# Patient Record
Sex: Male | Born: 1960 | Race: Black or African American | Hispanic: No | Marital: Married | Smoking: Never smoker
Health system: Southern US, Community
[De-identification: ages and names within clinical notes are randomized; demographics above are authoritative.]

## PROBLEM LIST (undated history)

## (undated) DIAGNOSIS — G4730 Sleep apnea, unspecified: Secondary | ICD-10-CM

## (undated) DIAGNOSIS — N5089 Other specified disorders of the male genital organs: Secondary | ICD-10-CM

## (undated) DIAGNOSIS — E119 Type 2 diabetes mellitus without complications: Secondary | ICD-10-CM

## (undated) DIAGNOSIS — N451 Epididymitis: Secondary | ICD-10-CM

## (undated) DIAGNOSIS — Z8739 Personal history of other diseases of the musculoskeletal system and connective tissue: Secondary | ICD-10-CM

## (undated) HISTORY — PX: CIRCUMCISION: SUR203

## (undated) HISTORY — PX: MASS BIOPSY: SHX5445

---

## 2015-04-08 ENCOUNTER — Ambulatory Visit: Payer: Self-pay | Admitting: Family Medicine

## 2015-04-15 ENCOUNTER — Ambulatory Visit (INDEPENDENT_AMBULATORY_CARE_PROVIDER_SITE_OTHER): Payer: Self-pay | Admitting: Family Medicine

## 2015-04-15 ENCOUNTER — Encounter: Payer: Self-pay | Admitting: Family Medicine

## 2015-04-15 VITALS — BP 137/82 | HR 68 | Temp 98.2°F | Resp 18 | Ht 71.5 in | Wt 278.0 lb

## 2015-04-15 DIAGNOSIS — E119 Type 2 diabetes mellitus without complications: Secondary | ICD-10-CM

## 2015-04-15 DIAGNOSIS — R030 Elevated blood-pressure reading, without diagnosis of hypertension: Secondary | ICD-10-CM | POA: Insufficient documentation

## 2015-04-15 DIAGNOSIS — Z23 Encounter for immunization: Secondary | ICD-10-CM

## 2015-04-15 LAB — POCT URINALYSIS DIP (DEVICE)
Bilirubin Urine: NEGATIVE
Glucose, UA: NEGATIVE mg/dL
Hgb urine dipstick: NEGATIVE
KETONES UR: NEGATIVE mg/dL
Nitrite: NEGATIVE
PH: 5.5 (ref 5.0–8.0)
PROTEIN: 100 mg/dL — AB
Urobilinogen, UA: 1 mg/dL (ref 0.0–1.0)

## 2015-04-15 LAB — HEMOGLOBIN A1C
Hgb A1c MFr Bld: 9.6 % — ABNORMAL HIGH (ref <5.7)
Mean Plasma Glucose: 229 mg/dL — ABNORMAL HIGH (ref <117)

## 2015-04-15 MED ORDER — LISINOPRIL 5 MG PO TABS: 5.0000 mg | ORAL_TABLET | Freq: Every day | ORAL | Status: DC

## 2015-04-15 NOTE — Progress Notes (Signed)
Subjective:    Patient ID: Spencer Robles, male    DOB: 1960/11/30, 54 y.o.   MRN: [id number]  HPI  Mr. Dallyn Bergland, a 54 year old male presents to establish care. He states that he was under the care of Dr. Vevelyn Pat, at [facility] in [place], [place] prior to relocating to the area. Patient reports a history of type 2 diabetes. He maintains that he was on Metformin twice daily, but discontinued medication due to financial constraints. He has been without medications for the past several months. He currently does not check blood sugars at home.  Patient denies foot ulcerations, increase appetite, nausea, paresthesia of the feet, polydipsia, polyuria, visual disturbances, vomitting and weight loss. Evaluation to date has been included: hemoglobin A1C.   No past medical history on file.  Social History   Social History  . Marital Status: Legally Separated    Spouse Name: N/A  . Number of Children: N/A  . Years of Education: N/A   Occupational History  . Not on file.   Social History Main Topics  . Smoking status: Never Smoker   . Smokeless tobacco: Not on file  . Alcohol Use: No  . Drug Use: No  . Sexual Activity: Not on file   Other Topics Concern  . Not on file   Social History Narrative   Immunization History  Administered Date(s) Administered  . Influenza,inj,Quad PF,36+ Mos 04/15/2015     Review of Systems  Constitutional: Negative.   HENT: Negative.   Eyes: Negative.   Respiratory: Negative.  Negative for shortness of breath.   Cardiovascular: Negative for chest pain, palpitations and leg swelling.  Gastrointestinal: Negative.   Endocrine: Negative.  Negative for polydipsia, polyphagia and polyuria.  Genitourinary: Negative.  Negative for dysuria, frequency, hematuria and decreased urine volume.  Musculoskeletal: Negative.   Skin: Negative.   Allergic/Immunologic: Negative.   Neurological: Negative.  Negative for dizziness and headaches.   Hematological: Negative.   Psychiatric/Behavioral: Negative.        Objective:   Physical Exam  Constitutional: He is oriented to person, place, and time. He appears well-developed and well-nourished.  HENT:  Head: Normocephalic and atraumatic.  Right Ear: External ear normal.  Left Ear: External ear normal.  Nose: Nose normal.  Mouth/Throat: Oropharynx is clear and moist.  Eyes: Conjunctivae and EOM are normal. Pupils are equal, round, and reactive to light.  Neck: Normal range of motion. Neck supple.  Cardiovascular: Normal rate, regular rhythm, normal heart sounds and intact distal pulses.   Pulmonary/Chest: Effort normal and breath sounds normal.  Abdominal: Soft. Bowel sounds are normal.  Musculoskeletal: Normal range of motion.  Neurological: He is alert and oriented to person, place, and time. He has normal reflexes.  Skin: Skin is warm and dry.  Psychiatric: He has a normal mood and affect. His behavior is normal. Judgment and thought content normal.      BP 137/82 mmHg  Pulse 68  Temp(Src) 98.2 F (36.8 C) (Oral)  Resp 18  Ht 5' 11.5" (1.816 m)  Wt 278 lb (126.1 kg)  BMI 38.24 kg/m2  SpO2 98% Assessment & Plan:  1. Diabetes mellitus type 2, uncomplicated Will re-start metformin. Recommend a lowfat, low carbohydrate diet divided over 5-6 small meals, increase water intake to 6-8 glasses, and 150 minutes per week of cardiovascular exercise. Will follow up in 1 month after re-starting medication.  - metFORMIN (GLUCOPHAGE) 500 MG tablet; Take 500 mg by mouth 2 (two) times daily  with a meal. - POCT urinalysis dipstick - Hemoglobin A1c - COMPLETE METABOLIC PANEL WITH GFR - Lipid Panel - lisinopril (PRINIVIL,ZESTRIL) 5 MG tablet; Take 1 tablet (5 mg total) by mouth daily.  Dispense: 30 tablet; Refill: 2  2. Prehypertension Blood pressure in prehypertensive state. Started low dose lisinopril. The patient is asked to make an attempt to improve diet and exercise patterns  to aid in medical management of this problem.  3. Need for immunization against influenza  - Flu Vaccine QUAD 36+ mos IM (Fluarix)   The patient was given clear instructions to go to ER or return to medical center if symptoms do not improve, worsen or new problems develop. The patient verbalized understanding. Will notify patient with laboratory results.   Dorena Dew, FNP

## 2015-04-15 NOTE — Patient Instructions (Addendum)
Return to clinic in 3 months for follow up  Start Lisinopril 5 mg daily Recommend a lowfat, low carbohydrate diet divided over 5-6 small meals, increase water intake to 6-8 glasses, and 150 minutes per week of cardiovascular exercise   Recommend follow up with prostate examination Diabetes and Exercise Exercising regularly is important. It is not just about losing weight. It has many health benefits, such as:  Improving your overall fitness, flexibility, and endurance.  Increasing your bone density.  Helping with weight control.  Decreasing your body fat.  Increasing your muscle strength.  Reducing stress and tension.  Improving your overall health. People with diabetes who exercise gain additional benefits because exercise:  Reduces appetite.  Improves the body's use of blood sugar (glucose).  Helps lower or control blood glucose.  Decreases blood pressure.  Helps control blood lipids (such as cholesterol and triglycerides).  Improves the body's use of the hormone insulin by:  Increasing the body's insulin sensitivity.  Reducing the body's insulin needs.  Decreases the risk for heart disease because exercising:  Lowers cholesterol and triglycerides levels.  Increases the levels of good cholesterol (such as high-density lipoproteins [HDL]) in the body.  Lowers blood glucose levels. YOUR ACTIVITY PLAN  Choose an activity that you enjoy and set realistic goals. Your health care provider or diabetes educator can help you make an activity plan that works for you. Exercise regularly as directed by your health care provider. This includes:  Performing resistance training twice a week such as push-ups, sit-ups, lifting weights, or using resistance bands.  Performing 150 minutes of cardio exercises each week such as walking, running, or playing sports.  Staying active and spending no more than 90 minutes at one time being inactive. Even short bursts of exercise are good  for you. Three 10-minute sessions spread throughout the day are just as beneficial as a single 30-minute session. Some exercise ideas include:  Taking the dog for a walk.  Taking the stairs instead of the elevator.  Dancing to your favorite song.  Doing an exercise video.  Doing your favorite exercise with a friend. RECOMMENDATIONS FOR EXERCISING WITH TYPE 1 OR TYPE 2 DIABETES   Check your blood glucose before exercising. If blood glucose levels are greater than 240 mg/dL, check for urine ketones. Do not exercise if ketones are present.  Avoid injecting insulin into areas of the body that are going to be exercised. For example, avoid injecting insulin into:  The arms when playing tennis.  The legs when jogging.  Keep a record of:  Food intake before and after you exercise.  Expected peak times of insulin action.  Blood glucose levels before and after you exercise.  The type and amount of exercise you have done.  Review your records with your health care provider. Your health care provider will help you to develop guidelines for adjusting food intake and insulin amounts before and after exercising.  If you take insulin or oral hypoglycemic agents, watch for signs and symptoms of hypoglycemia. They include:  Dizziness.  Shaking.  Sweating.  Chills.  Confusion.  Drink plenty of water while you exercise to prevent dehydration or heat stroke. Body water is lost during exercise and must be replaced.  Talk to your health care provider before starting an exercise program to make sure it is safe for you. Remember, almost any type of activity is better than none. Document Released: 09/30/2003 Document Revised: 11/24/2013 Document Reviewed: 12/17/2012 [facility] Patient Information 2015 [place], [place]. This information  is not intended to replace advice given to you by your health care provider. Make sure you discuss any questions you have with your health care provider. Daily  Diabetes Record Check your blood glucose (BG) as directed by your health care provider. Use this form to record your results as well as any diabetes medicines you take, including insulin. Checking your BG, recording it, and bringing your records to your health care provider is very helpful in managing your diabetes. These numbers help your health care provider know if any changes are needed to your diabetes plan.  Week of _____________________________ Date: _________  Spencer Robles, BG/Medicines: ________________ / __________________________________________________________  LUNCH, BG/Medicines: ____________________ / __________________________________________________________  Spencer Robles, BG/Medicines: ___________________ / __________________________________________________________  BEDTIME, BG/Medicines: __________________ / __________________________________________________________ Date: _________  Spencer Robles, BG/Medicines: ________________ / __________________________________________________________  LUNCH, BG/Medicines: ____________________ / __________________________________________________________  Spencer Robles, BG/Medicines: ___________________ / __________________________________________________________  BEDTIME, BG/Medicines: __________________ / __________________________________________________________ Date: _________  Spencer Robles, BG/Medicines: ________________ / __________________________________________________________  LUNCH, BG/Medicines: ____________________ / __________________________________________________________  Spencer Robles, BG/Medicines: ___________________ / __________________________________________________________  BEDTIME, BG/Medicines: __________________ / __________________________________________________________ Date: _________  Spencer Robles, BG/Medicines: ________________ / __________________________________________________________  LUNCH, BG/Medicines: ____________________  / __________________________________________________________  Spencer Robles, BG/Medicines: ___________________ / __________________________________________________________  BEDTIME, BG/Medicines: __________________ / __________________________________________________________ Date: _________  Spencer Robles, BG/Medicines: ________________ / __________________________________________________________  LUNCH, BG/Medicines: ____________________ / __________________________________________________________  Spencer Robles, BG/Medicines: ___________________ / __________________________________________________________  BEDTIME, BG/Medicines: __________________ / __________________________________________________________ Date: _________  Spencer Robles, BG/Medicines: ________________ / __________________________________________________________  LUNCH, BG/Medicines: ____________________ / __________________________________________________________  Spencer Robles, BG/Medicines: ___________________ / __________________________________________________________  BEDTIME, BG/Medicines: __________________ / __________________________________________________________ Date: _________  Spencer Robles, BG/Medicines: ________________ / __________________________________________________________  LUNCH, BG/Medicines: ____________________ / __________________________________________________________  Spencer Robles, BG/Medicines: ___________________ / __________________________________________________________  BEDTIME, BG/Medicines: __________________ / __________________________________________________________ Notes: __________________________________________________________________________________________________ Document Released: 06/13/2004 Document Revised: 11/24/2013 Document Reviewed: 09/03/2013 [employer] Patient Information 2015 [place] [place]. This information is not intended to replace advice given to you by your health care provider. Make sure  you discuss any questions you have with your health care provider.

## 2015-04-16 LAB — COMPLETE METABOLIC PANEL WITH GFR
ALBUMIN: 4.4 g/dL (ref 3.6–5.1)
ALK PHOS: 96 U/L (ref 40–115)
ALT: 19 U/L (ref 9–46)
AST: 22 U/L (ref 10–35)
BUN: 10 mg/dL (ref 7–25)
CALCIUM: 9.7 mg/dL (ref 8.6–10.3)
CO2: 26 mmol/L (ref 20–31)
CREATININE: 0.96 mg/dL (ref 0.70–1.33)
Chloride: 100 mmol/L (ref 98–110)
GFR, Est African American: >89 mL/min (ref >=60)
GFR, Est Non African American: 88 mL/min (ref >=60)
Glucose, Bld: 148 mg/dL — ABNORMAL HIGH (ref 65–99)
Potassium: 4.4 mmol/L (ref 3.5–5.3)
Sodium: 141 mmol/L (ref 135–146)
TOTAL PROTEIN: 7.2 g/dL (ref 6.1–8.1)
Total Bilirubin: 0.9 mg/dL (ref 0.2–1.2)

## 2015-04-16 LAB — LIPID PANEL
CHOLESTEROL: 131 mg/dL (ref 125–200)
HDL: 44 mg/dL (ref >=40)
LDL Cholesterol: 74 mg/dL (ref <130)
Total CHOL/HDL Ratio: 3 Ratio (ref <=5.0)
Triglycerides: 64 mg/dL (ref <150)
VLDL: 13 mg/dL (ref <30)

## 2015-04-18 ENCOUNTER — Telehealth: Payer: Self-pay | Admitting: Family Medicine

## 2015-04-18 ENCOUNTER — Encounter: Payer: Self-pay | Admitting: Family Medicine

## 2015-04-18 NOTE — Telephone Encounter (Signed)
Reviewed labs, hemoglobin is 9.6, goal is <7.0%.  patient reported that he had been out of anti-diabetic medication for greater than 6 months. Will re-start Metformin as discussed during office visit. Recommend a lowfat, low carbohydrate diet divided over 5-6 small meals, increase water intake to 6-8 glasses, and 150 minutes per week of cardiovascular exercise.   Will re-check hemoglobin A1C in 3 months. Patient to follow up in office as scheduled.    Dorena Dew, FNP

## 2015-04-19 NOTE — Telephone Encounter (Signed)
Tried to call patient. Number not in service. Will Try later.

## 2015-04-21 NOTE — Telephone Encounter (Signed)
Tried to call, phone not in service.

## 2015-05-17 ENCOUNTER — Encounter: Payer: Self-pay | Admitting: Family Medicine

## 2015-05-17 ENCOUNTER — Ambulatory Visit (INDEPENDENT_AMBULATORY_CARE_PROVIDER_SITE_OTHER): Payer: Self-pay | Admitting: Family Medicine

## 2015-05-17 ENCOUNTER — Other Ambulatory Visit: Payer: Self-pay

## 2015-05-17 VITALS — BP 129/78 | HR 49 | Temp 98.3°F | Resp 16 | Ht 71.5 in | Wt 254.0 lb

## 2015-05-17 DIAGNOSIS — N390 Urinary tract infection, site not specified: Secondary | ICD-10-CM

## 2015-05-17 DIAGNOSIS — E119 Type 2 diabetes mellitus without complications: Secondary | ICD-10-CM

## 2015-05-17 DIAGNOSIS — R82998 Other abnormal findings in urine: Secondary | ICD-10-CM

## 2015-05-17 DIAGNOSIS — R030 Elevated blood-pressure reading, without diagnosis of hypertension: Secondary | ICD-10-CM

## 2015-05-17 LAB — POCT URINALYSIS DIP (DEVICE)
GLUCOSE, UA: NEGATIVE mg/dL
HGB URINE DIPSTICK: NEGATIVE
Ketones, ur: NEGATIVE mg/dL
Nitrite: NEGATIVE
PROTEIN: 30 mg/dL — AB
Specific Gravity, Urine: >=1.03 (ref 1.005–1.030)
Urobilinogen, UA: 0.2 mg/dL (ref 0.0–1.0)
pH: 5.5 (ref 5.0–8.0)

## 2015-05-17 LAB — GLUCOSE, CAPILLARY: GLUCOSE-CAPILLARY: 145 mg/dL — AB (ref 65–99)

## 2015-05-17 MED ORDER — LISINOPRIL 5 MG PO TABS: 5.0000 mg | ORAL_TABLET | Freq: Every day | ORAL | Status: DC

## 2015-05-17 MED ORDER — METFORMIN HCL 500 MG PO TABS: 500.0000 mg | ORAL_TABLET | Freq: Two times a day (BID) | ORAL | Status: DC

## 2015-05-17 NOTE — Patient Instructions (Signed)
Diabetes and Exercise Exercising regularly is important. It is not just about losing weight. It has many health benefits, such as:  Improving your overall fitness, flexibility, and endurance.  Increasing your bone density.  Helping with weight control.  Decreasing your body fat.  Increasing your muscle strength.  Reducing stress and tension.  Improving your overall health. People with diabetes who exercise gain additional benefits because exercise:  Reduces appetite.  Improves the body's use of blood sugar (glucose).  Helps lower or control blood glucose.  Decreases blood pressure.  Helps control blood lipids (such as cholesterol and triglycerides).  Improves the body's use of the hormone insulin by:  Increasing the body's insulin sensitivity.  Reducing the body's insulin needs.  Decreases the risk for heart disease because exercising:  Lowers cholesterol and triglycerides levels.  Increases the levels of good cholesterol (such as high-density lipoproteins [HDL]) in the body.  Lowers blood glucose levels. YOUR ACTIVITY PLAN  Choose an activity that you enjoy, and set realistic goals. To exercise safely, you should begin practicing any new physical activity slowly, and gradually increase the intensity of the exercise over time. Your health care provider or diabetes educator can help create an activity plan that works for you. General recommendations include:  Encouraging children to engage in at least 60 minutes of physical activity each day.  Stretching and performing strength training exercises, such as yoga or weight lifting, at least 2 times per week.  Performing a total of at least 150 minutes of moderate-intensity exercise each week, such as brisk walking or water aerobics.  Exercising at least 3 days per week, making sure you allow no more than 2 consecutive days to pass without exercising.  Avoiding long periods of inactivity (90 minutes or more). When you  have to spend an extended period of time sitting down, take frequent breaks to walk or stretch. RECOMMENDATIONS FOR EXERCISING WITH TYPE 1 OR TYPE 2 DIABETES   Check your blood glucose before exercising. If blood glucose levels are greater than 240 mg/dL, check for urine ketones. Do not exercise if ketones are present.  Avoid injecting insulin into areas of the body that are going to be exercised. For example, avoid injecting insulin into:  The arms when playing tennis.  The legs when jogging.  Keep a record of:  Food intake before and after you exercise.  Expected peak times of insulin action.  Blood glucose levels before and after you exercise.  The type and amount of exercise you have done.  Review your records with your health care provider. Your health care provider will help you to develop guidelines for adjusting food intake and insulin amounts before and after exercising.  If you take insulin or oral hypoglycemic agents, watch for signs and symptoms of hypoglycemia. They include:  Dizziness.  Shaking.  Sweating.  Chills.  Confusion.  Drink plenty of water while you exercise to prevent dehydration or heat stroke. Body water is lost during exercise and must be replaced.  Talk to your health care provider before starting an exercise program to make sure it is safe for you. Remember, almost any type of activity is better than none.   This information is not intended to replace advice given to you by your health care provider. Make sure you discuss any questions you have with your health care provider.   Document Released: 09/30/2003 Document Revised: 11/24/2014 Document Reviewed: 12/17/2012 Elsevier Interactive Patient Education 2016 [employer]  

## 2015-05-17 NOTE — Progress Notes (Signed)
Subjective:    Patient ID: Spencer Robles, male    DOB: [birth year], 54 y.o.   MRN: [id number] Mr. Hershell Brandl, a 54 year old male with a history of type 2 diabetes presents for a 1 month follow up after restarting medication. He reports that he was unable to pick up lisinopril due to transportation constraints. He has been exercising, following a low carbohydrate diet, and taking medication consistently.  Diabetes He presents for his follow-up diabetic visit. He has type 2 diabetes mellitus. No MedicAlert identification noted. His disease course has been stable. Pertinent negatives for hypoglycemia include no confusion, dizziness, headaches, hunger, mood changes, nervousness/anxiousness, pallor, seizures, sleepiness, speech difficulty, sweats or tremors. Pertinent negatives for diabetes include no blurred vision, no chest pain, no fatigue, no foot paresthesias, no foot ulcerations, no polydipsia, no polyphagia, no polyuria, no visual change, no weakness and no weight loss. There are no hypoglycemic complications. Symptoms are stable. Risk factors for coronary artery disease include male sex and obesity. Current diabetic treatment includes oral agent (monotherapy). He is compliant with treatment all of the time. His weight is stable. He is following a diabetic diet. Meal planning includes carbohydrate counting. He has not had a previous visit with a dietitian. He participates in exercise daily. There is no change in his home blood glucose trend. An ACE inhibitor/angiotensin II receptor blocker is being taken. He does not see a podiatrist.Eye exam is not current.   No past medical history on file.  Social History   Social History  . Marital Status: Legally Separated    Spouse Name: N/A  . Number of Children: N/A  . Years of Education: N/A   Occupational History  . Not on file.   Social History Main Topics  . Smoking status: Never Smoker   . Smokeless tobacco: Not on file  . Alcohol Use: No   . Drug Use: No  . Sexual Activity: Not on file   Other Topics Concern  . Not on file   Social History Narrative   Immunization History  Administered Date(s) Administered  . Influenza,inj,Quad PF,36+ Mos 04/15/2015     Review of Systems  Constitutional: Negative.  Negative for weight loss and fatigue.  HENT: Negative.   Eyes: Negative.  Negative for blurred vision.  Respiratory: Negative.   Cardiovascular: Negative for chest pain and leg swelling.  Gastrointestinal: Negative.   Endocrine: Negative.  Negative for polydipsia, polyphagia and polyuria.  Genitourinary: Negative.  Negative for dysuria, frequency, hematuria and decreased urine volume.  Musculoskeletal: Negative.   Skin: Negative.  Negative for pallor.  Allergic/Immunologic: Negative.  Negative for immunocompromised state.  Neurological: Negative.  Negative for dizziness, tremors, seizures, speech difficulty, weakness and headaches.  Hematological: Negative.   Psychiatric/Behavioral: Negative.  Negative for suicidal ideas and confusion. The patient is not nervous/anxious.        Objective:   Physical Exam  Constitutional: He is oriented to person, place, and time. He appears well-developed and well-nourished.  Obesity  HENT:  Head: Normocephalic and atraumatic.  Right Ear: External ear normal.  Left Ear: External ear normal.  Nose: Nose normal.  Mouth/Throat: Oropharynx is clear and moist.  Eyes: Conjunctivae and EOM are normal. Pupils are equal, round, and reactive to light.  Neck: Normal range of motion. Neck supple.  Cardiovascular: Normal rate, regular rhythm, normal heart sounds and intact distal pulses.   Pulmonary/Chest: Effort normal and breath sounds normal.  Abdominal: Soft. Bowel sounds are normal.  Musculoskeletal: Normal range of motion.  Neurological: He is alert and oriented to person, place, and time. He has normal reflexes.  Skin: Skin is warm and dry.  Psychiatric: He has a normal mood and  affect. His behavior is normal. Judgment and thought content normal.      BP 129/78 mmHg  Pulse 49  Temp(Src) 98.3 F (36.8 C) (Oral)  Resp 16  Ht 5' 11.5" (1.816 m)  Wt 254 lb (115.214 kg)  BMI 34.94 kg/m2 Assessment & Plan:  1. Diabetes mellitus type 2, uncomplicated Started metformin and is ytaking medication consistently. Blood sugar is 145. Marland Kitchen Recommend a lowfat, low carbohydrate diet divided over 5-6 small meals, increase water intake to 6-8 glasses, and 150 minutes per week of cardiovascular exercise. Will follow up in 3 months.  - Glucose (CBG) - POCT urinalysis dipstick  metFORMIN (GLUCOPHAGE) 500 MG tablet; Take 1 tablet (500 mg total) by mouth 2 (two) times daily with a meal.  Dispense: 60 tablet; Refill: 5   2. Prehypertension Blood pressure in prehypertensive state. Start low dose lisinopril. The patient is asked to make an attempt to improve diet and exercise patterns to aid in medical management of this problem.   3. Urine leukocytes  - Urine culture  Kynan Peasley M, FNP The patient was given clear instructions to go to ER or return to medical center if symptoms do not improve, worsen or new problems develop. The patient verbalized understanding. Will notify patient with laboratory results.

## 2015-05-20 ENCOUNTER — Other Ambulatory Visit: Payer: Self-pay | Admitting: Family Medicine

## 2015-05-20 DIAGNOSIS — N390 Urinary tract infection, site not specified: Secondary | ICD-10-CM

## 2015-05-20 LAB — URINE CULTURE: Colony Count: 60000

## 2015-05-20 MED ORDER — CIPROFLOXACIN HCL 500 MG PO TABS: 500.0000 mg | ORAL_TABLET | Freq: Two times a day (BID) | ORAL | Status: DC

## 2015-05-20 NOTE — Progress Notes (Signed)
Tried to call patient, number not in service. Will Try later.

## 2015-05-24 NOTE — Progress Notes (Signed)
Will Mail letter to patients address on file since no working number in chart including emergency contact. Thanks!

## 2015-05-24 NOTE — Progress Notes (Signed)
Patients number is not in service. Will Try later.

## 2015-08-17 ENCOUNTER — Ambulatory Visit (INDEPENDENT_AMBULATORY_CARE_PROVIDER_SITE_OTHER): Payer: Self-pay | Admitting: Family Medicine

## 2015-08-17 VITALS — BP 143/79 | HR 53 | Temp 98.2°F | Resp 16 | Ht 71.5 in | Wt 268.0 lb

## 2015-08-17 DIAGNOSIS — E119 Type 2 diabetes mellitus without complications: Secondary | ICD-10-CM

## 2015-08-17 DIAGNOSIS — R030 Elevated blood-pressure reading, without diagnosis of hypertension: Secondary | ICD-10-CM

## 2015-08-17 DIAGNOSIS — Z794 Long term (current) use of insulin: Secondary | ICD-10-CM

## 2015-08-17 LAB — COMPLETE METABOLIC PANEL WITH GFR
ALBUMIN: 4.3 g/dL (ref 3.6–5.1)
ALK PHOS: 94 U/L (ref 40–115)
ALT: 19 U/L (ref 9–46)
AST: 18 U/L (ref 10–35)
BILIRUBIN TOTAL: 0.5 mg/dL (ref 0.2–1.2)
BUN: 9 mg/dL (ref 7–25)
CALCIUM: 9.3 mg/dL (ref 8.6–10.3)
CO2: 29 mmol/L (ref 20–31)
CREATININE: 0.92 mg/dL (ref 0.70–1.33)
Chloride: 101 mmol/L (ref 98–110)
GFR, Est Non African American: >89 mL/min (ref >=60)
Glucose, Bld: 127 mg/dL — ABNORMAL HIGH (ref 65–99)
Potassium: 4.1 mmol/L (ref 3.5–5.3)
Sodium: 137 mmol/L (ref 135–146)
Total Protein: 7.2 g/dL (ref 6.1–8.1)

## 2015-08-17 LAB — POCT URINALYSIS DIP (DEVICE)
Bilirubin Urine: NEGATIVE
Glucose, UA: NEGATIVE mg/dL
HGB URINE DIPSTICK: NEGATIVE
KETONES UR: NEGATIVE mg/dL
Nitrite: NEGATIVE
PH: 5.5 (ref 5.0–8.0)
PROTEIN: 30 mg/dL — AB
Urobilinogen, UA: 0.2 mg/dL (ref 0.0–1.0)

## 2015-08-17 NOTE — Progress Notes (Signed)
Subjective:    Patient ID: Spencer Robles, male    DOB: January 28, 1961, 55 y.o.   MRN: [id number] Spencer Robles, a 55 year old male with a history of type 2 diabetes presents for a 85month follow up. He reports that he has not been taking medication consistently due to cost constraints.  He reports that he was unable to pick up lisinopril due to transportation constraints. He has been exercising, following a low carbohydrate diet, and taking medication consistently.  Hypertension Pertinent negatives include no blurred vision, chest pain, headaches or sweats.  Diabetes He presents for his follow-up diabetic visit. He has type 2 diabetes mellitus. No MedicAlert identification noted. His disease course has been stable. Pertinent negatives for hypoglycemia include no confusion, dizziness, headaches, hunger, mood changes, nervousness/anxiousness, pallor, seizures, sleepiness, speech difficulty, sweats or tremors. Pertinent negatives for diabetes include no blurred vision, no chest pain, no fatigue, no foot paresthesias, no foot ulcerations, no polydipsia, no polyphagia, no polyuria, no visual change, no weakness and no weight loss. There are no hypoglycemic complications. Symptoms are stable. Risk factors for coronary artery disease include male sex and obesity. Current diabetic treatment includes oral agent (monotherapy). He is compliant with treatment all of the time. His weight is stable. He is following a diabetic diet. Meal planning includes carbohydrate counting. He has not had a previous visit with a dietitian. He participates in exercise daily. There is no change in his home blood glucose trend. An ACE inhibitor/angiotensin II receptor blocker is being taken. He does not see a podiatrist.Eye exam is not current.   No past medical history on file.  Social History   Social History  . Marital Status: Legally Separated    Spouse Name: N/A  . Number of Children: N/A  . Years of Education: N/A    Occupational History  . Not on file.   Social History Main Topics  . Smoking status: Never Smoker   . Smokeless tobacco: Not on file  . Alcohol Use: No  . Drug Use: No  . Sexual Activity: Not on file   Other Topics Concern  . Not on file   Social History Narrative   Immunization History  Administered Date(s) Administered  . Influenza,inj,Quad PF,36+ Mos 04/15/2015     Review of Systems  Constitutional: Negative.  Negative for weight loss and fatigue.  HENT: Negative.   Eyes: Negative.  Negative for blurred vision.  Respiratory: Negative.   Cardiovascular: Negative for chest pain and leg swelling.  Gastrointestinal: Negative.   Endocrine: Negative.  Negative for polydipsia, polyphagia and polyuria.  Genitourinary: Negative.  Negative for dysuria, frequency, hematuria and decreased urine volume.  Musculoskeletal: Negative.   Skin: Negative.  Negative for pallor.  Allergic/Immunologic: Negative.  Negative for immunocompromised state.  Neurological: Negative.  Negative for dizziness, tremors, seizures, speech difficulty, weakness and headaches.  Hematological: Negative.   Psychiatric/Behavioral: Negative.  Negative for suicidal ideas and confusion. The patient is not nervous/anxious.        Objective:   Physical Exam  Constitutional: He is oriented to person, place, and time. He appears well-developed and well-nourished.  Obesity  HENT:  Head: Normocephalic and atraumatic.  Right Ear: External ear normal.  Left Ear: External ear normal.  Nose: Nose normal.  Mouth/Throat: Oropharynx is clear and moist.  Eyes: Conjunctivae and EOM are normal. Pupils are equal, round, and reactive to light.  Neck: Normal range of motion. Neck supple.  Cardiovascular: Normal rate, regular rhythm, normal heart sounds and intact  distal pulses.   Pulmonary/Chest: Effort normal and breath sounds normal.  Abdominal: Soft. Bowel sounds are normal.  Musculoskeletal: Normal range of motion.   Neurological: He is alert and oriented to person, place, and time. He has normal reflexes.  Monofilament test  Skin: Skin is warm and dry.  Psychiatric: He has a normal mood and affect. His behavior is normal. Judgment and thought content normal.      BP 143/79 mmHg  Pulse 53  Temp(Src) 98.2 F (36.8 C) (Oral)  Resp 16  Ht 5' 11.5" (1.816 m)  Wt 268 lb (121.564 kg)  BMI 36.86 kg/m2 Assessment & Plan:  1. Diabetes mellitus type 2, uncomplicated Started metformin and is ytaking medication consistently. Blood sugar is 145. Marland Kitchen Recommend a lowfat, low carbohydrate diet divided over 5-6 small meals, increase water intake to 6-8 glasses, and 150 minutes per week of cardiovascular exercise. Will follow up in 3 months.  - POCT urinalysis dipstick - Hemoglobin A1c - COMPLETE METABOLIC PANEL WITH GFR  2. Prehypertension Blood pressure in prehypertensive state. Continue low dose lisinopril. The patient is asked to make an attempt to improve diet and exercise patterns to aid in medical management of this problem.    Hollis,Lachina M, FNP The patient was given clear instructions to go to ER or return to medical center if symptoms do not improve, worsen or new problems develop. The patient verbalized understanding. Will notify patient with laboratory results.

## 2015-08-18 ENCOUNTER — Telehealth: Payer: Self-pay

## 2015-08-18 ENCOUNTER — Encounter: Payer: Self-pay | Admitting: Family Medicine

## 2015-08-18 LAB — HEMOGLOBIN A1C
HEMOGLOBIN A1C: 7.1 % — AB (ref <5.7)
Mean Plasma Glucose: 157 mg/dL — ABNORMAL HIGH (ref <117)

## 2015-08-18 MED ORDER — METFORMIN HCL 500 MG PO TABS: 500.0000 mg | ORAL_TABLET | Freq: Two times a day (BID) | ORAL | Status: DC

## 2015-08-18 MED ORDER — LISINOPRIL 5 MG PO TABS: 5.0000 mg | ORAL_TABLET | Freq: Every day | ORAL | Status: DC

## 2015-08-18 NOTE — Telephone Encounter (Signed)
-----   Message from Dorena Dew, [place] sent at 08/18/2015  7:02 AM EST ----- Regarding: lab results Please inform patient that hemoglobin a1C has decreased from 9.6 to 7.1. Will continue Metformin and Lisinopril as previously prescribed.  Patient to follow up in 6 months. Please have him schedule a 6 month follow up.  Thanks ----- Message -----    From: Lab in [facility] Interface    Sent: 08/18/2015   1:03 AM      To: Dorena Dew, FNP

## 2015-08-18 NOTE — Patient Instructions (Signed)
DASH Eating Plan  DASH stands for "Dietary Approaches to Stop Hypertension." The DASH eating plan is a healthy eating plan that has been shown to reduce high blood pressure (hypertension). Additional health benefits may include reducing the risk of type 2 diabetes mellitus, heart disease, and stroke. The DASH eating plan may also help with weight loss.  WHAT DO I NEED TO KNOW ABOUT THE DASH EATING PLAN?  For the DASH eating plan, you will follow these general guidelines:  · Choose foods with a percent daily value for sodium of less than 5% (as listed on the food label).  · Use salt-free seasonings or herbs instead of table salt or sea salt.  · Check with your health care provider or pharmacist before using salt substitutes.  · Eat lower-sodium products, often labeled as "lower sodium" or "no salt added."  · Eat fresh foods.  · Eat more vegetables, fruits, and low-fat dairy products.  · Choose whole grains. Look for the word "whole" as the first word in the ingredient list.  · Choose fish and skinless chicken or turkey more often than red meat. Limit fish, poultry, and meat to 6 oz (170 g) each day.  · Limit sweets, desserts, sugars, and sugary drinks.  · Choose heart-healthy fats.  · Limit cheese to 1 oz (28 g) per day.  · Eat more home-cooked food and less restaurant, buffet, and fast food.  · Limit fried foods.  · Cook foods using methods other than frying.  · Limit canned vegetables. If you do use them, rinse them well to decrease the sodium.  · When eating at a restaurant, ask that your food be prepared with less salt, or no salt if possible.  WHAT FOODS CAN I EAT?  Seek help from a dietitian for individual calorie needs.  Grains  Whole grain or whole wheat bread. Brown rice. Whole grain or whole wheat pasta. Quinoa, bulgur, and whole grain cereals. Low-sodium cereals. Corn or whole wheat flour tortillas. Whole grain cornbread. Whole grain crackers. Low-sodium crackers.  Vegetables  Fresh or frozen vegetables  (raw, steamed, roasted, or grilled). Low-sodium or reduced-sodium tomato and vegetable juices. Low-sodium or reduced-sodium tomato sauce and paste. Low-sodium or reduced-sodium canned vegetables.   Fruits  All fresh, canned (in natural juice), or frozen fruits.  Meat and Other Protein Products  Ground beef (85% or leaner), grass-fed beef, or beef trimmed of fat. Skinless chicken or turkey. Ground chicken or turkey. Pork trimmed of fat. All fish and seafood. Eggs. Dried beans, peas, or lentils. Unsalted nuts and seeds. Unsalted canned beans.  Dairy  Low-fat dairy products, such as skim or 1% milk, 2% or reduced-fat cheeses, low-fat ricotta or cottage cheese, or plain low-fat yogurt. Low-sodium or reduced-sodium cheeses.  Fats and Oils  Tub margarines without trans fats. Light or reduced-fat mayonnaise and salad dressings (reduced sodium). Avocado. Safflower, olive, or canola oils. Natural peanut or almond butter.  Other  Unsalted popcorn and pretzels.  The items listed above may not be a complete list of recommended foods or beverages. Contact your dietitian for more options.  WHAT FOODS ARE NOT RECOMMENDED?  Grains  White bread. White pasta. White rice. Refined cornbread. Bagels and croissants. Crackers that contain trans fat.  Vegetables  Creamed or fried vegetables. Vegetables in a cheese sauce. Regular canned vegetables. Regular canned tomato sauce and paste. Regular tomato and vegetable juices.  Fruits  Dried fruits. Canned fruit in light or heavy syrup. Fruit juice.  Meat and Other Protein   Products  Fatty cuts of meat. Ribs, chicken wings, bacon, sausage, bologna, salami, chitterlings, fatback, hot dogs, bratwurst, and packaged luncheon meats. Salted nuts and seeds. Canned beans with salt.  Dairy  Whole or 2% milk, cream, half-and-half, and cream cheese. Whole-fat or sweetened yogurt. Full-fat cheeses or blue cheese. Nondairy creamers and whipped toppings. Processed cheese, cheese spreads, or cheese  curds.  Condiments  Onion and garlic salt, seasoned salt, table salt, and sea salt. Canned and packaged gravies. Worcestershire sauce. Tartar sauce. Barbecue sauce. Teriyaki sauce. Soy sauce, including reduced sodium. Steak sauce. Fish sauce. Oyster sauce. Cocktail sauce. Horseradish. Ketchup and mustard. Meat flavorings and tenderizers. Bouillon cubes. Hot sauce. Tabasco sauce. Marinades. Taco seasonings. Relishes.  Fats and Oils  Butter, stick margarine, lard, shortening, ghee, and bacon fat. Coconut, palm kernel, or palm oils. Regular salad dressings.  Other  Pickles and olives. Salted popcorn and pretzels.  The items listed above may not be a complete list of foods and beverages to avoid. Contact your dietitian for more information.  WHERE CAN I FIND MORE INFORMATION?  National Heart, Lung, and [employer] Institute: www.nhlbi.nih.gov/health/health-topics/topics/dash/     This information is not intended to replace advice given to you by your health care provider. Make sure you discuss any questions you have with your health care provider.     Document Released: 06/29/2011 Document Revised: 07/31/2014 Document Reviewed: 05/14/2013  Elsevier Interactive Patient Education ©2016 Elsevier Inc.

## 2015-08-18 NOTE — Telephone Encounter (Signed)
Called and informed patient of current labs, asked patient to continue metformin and lisinopril as prescribed and appointment was scheduled for 6 month follow up. Thanks!

## 2016-02-14 ENCOUNTER — Ambulatory Visit: Payer: Self-pay | Admitting: Family Medicine

## 2017-06-23 DIAGNOSIS — Z8739 Personal history of other diseases of the musculoskeletal system and connective tissue: Secondary | ICD-10-CM

## 2017-06-23 HISTORY — DX: Personal history of other diseases of the musculoskeletal system and connective tissue: Z87.39

## 2017-07-03 ENCOUNTER — Inpatient Hospital Stay (HOSPITAL_COMMUNITY)
Admission: EM | Admit: 2017-07-03 | Discharge: 2017-07-10 | DRG: 617 | Disposition: A | Discharge status: Skilled Nursing Facility | Payer: Self-pay | Attending: Internal Medicine | Admitting: Internal Medicine

## 2017-07-03 ENCOUNTER — Other Ambulatory Visit: Payer: Self-pay

## 2017-07-03 ENCOUNTER — Emergency Department (HOSPITAL_COMMUNITY): Payer: Self-pay

## 2017-07-03 ENCOUNTER — Encounter (HOSPITAL_COMMUNITY): Payer: Self-pay

## 2017-07-03 DIAGNOSIS — E11628 Type 2 diabetes mellitus with other skin complications: Principal | ICD-10-CM | POA: Diagnosis present

## 2017-07-03 DIAGNOSIS — E118 Type 2 diabetes mellitus with unspecified complications: Secondary | ICD-10-CM | POA: Diagnosis present

## 2017-07-03 DIAGNOSIS — L03031 Cellulitis of right toe: Secondary | ICD-10-CM | POA: Diagnosis present

## 2017-07-03 DIAGNOSIS — L97519 Non-pressure chronic ulcer of other part of right foot with unspecified severity: Secondary | ICD-10-CM | POA: Diagnosis present

## 2017-07-03 DIAGNOSIS — E1152 Type 2 diabetes mellitus with diabetic peripheral angiopathy with gangrene: Secondary | ICD-10-CM | POA: Diagnosis present

## 2017-07-03 DIAGNOSIS — L02611 Cutaneous abscess of right foot: Secondary | ICD-10-CM | POA: Diagnosis present

## 2017-07-03 DIAGNOSIS — Z7984 Long term (current) use of oral hypoglycemic drugs: Secondary | ICD-10-CM

## 2017-07-03 DIAGNOSIS — Z79899 Other long term (current) drug therapy: Secondary | ICD-10-CM

## 2017-07-03 DIAGNOSIS — I10 Essential (primary) hypertension: Secondary | ICD-10-CM | POA: Diagnosis present

## 2017-07-03 DIAGNOSIS — Z9114 Patient's other noncompliance with medication regimen: Secondary | ICD-10-CM

## 2017-07-03 DIAGNOSIS — E669 Obesity, unspecified: Secondary | ICD-10-CM | POA: Diagnosis present

## 2017-07-03 DIAGNOSIS — E1142 Type 2 diabetes mellitus with diabetic polyneuropathy: Secondary | ICD-10-CM | POA: Diagnosis present

## 2017-07-03 DIAGNOSIS — E11621 Type 2 diabetes mellitus with foot ulcer: Secondary | ICD-10-CM | POA: Diagnosis present

## 2017-07-03 DIAGNOSIS — L089 Local infection of the skin and subcutaneous tissue, unspecified: Secondary | ICD-10-CM

## 2017-07-03 DIAGNOSIS — Z6832 Body mass index (BMI) 32.0-32.9, adult: Secondary | ICD-10-CM

## 2017-07-03 DIAGNOSIS — E1169 Type 2 diabetes mellitus with other specified complication: Secondary | ICD-10-CM | POA: Diagnosis present

## 2017-07-03 DIAGNOSIS — M86271 Subacute osteomyelitis, right ankle and foot: Secondary | ICD-10-CM

## 2017-07-03 DIAGNOSIS — E1165 Type 2 diabetes mellitus with hyperglycemia: Secondary | ICD-10-CM | POA: Diagnosis present

## 2017-07-03 DIAGNOSIS — D631 Anemia in chronic kidney disease: Secondary | ICD-10-CM | POA: Diagnosis present

## 2017-07-03 HISTORY — DX: Type 2 diabetes mellitus without complications: E11.9

## 2017-07-03 HISTORY — DX: Sleep apnea, unspecified: G47.30

## 2017-07-03 LAB — CREATININE, SERUM
CREATININE: 0.81 mg/dL (ref 0.61–1.24)
GFR calc Af Amer: >60 mL/min (ref >60)

## 2017-07-03 LAB — URINALYSIS, ROUTINE W REFLEX MICROSCOPIC
BACTERIA UA: NONE SEEN
BILIRUBIN URINE: NEGATIVE
Glucose, UA: >=500 mg/dL — AB
Hgb urine dipstick: NEGATIVE
Ketones, ur: NEGATIVE mg/dL
Leukocytes, UA: NEGATIVE
NITRITE: NEGATIVE
PH: 6 (ref 5.0–8.0)
Protein, ur: NEGATIVE mg/dL
SPECIFIC GRAVITY, URINE: 1.027 (ref 1.005–1.030)
SQUAMOUS EPITHELIAL / LPF: NONE SEEN

## 2017-07-03 LAB — CBC WITH DIFFERENTIAL/PLATELET
Basophils Absolute: 0.1 10*3/uL (ref 0.0–0.1)
Basophils Relative: 0 %
EOS ABS: 0.2 10*3/uL (ref 0.0–0.7)
EOS PCT: 2 %
HCT: 38.9 % — ABNORMAL LOW (ref 39.0–52.0)
Hemoglobin: 13.4 g/dL (ref 13.0–17.0)
LYMPHS ABS: 2.2 10*3/uL (ref 0.7–4.0)
LYMPHS PCT: 17 %
MCH: 27.9 pg (ref 26.0–34.0)
MCHC: 34.4 g/dL (ref 30.0–36.0)
MCV: 81 fL (ref 78.0–100.0)
MONO ABS: 0.5 10*3/uL (ref 0.1–1.0)
Monocytes Relative: 4 %
Neutro Abs: 10.4 10*3/uL — ABNORMAL HIGH (ref 1.7–7.7)
Neutrophils Relative %: 77 %
PLATELETS: 207 10*3/uL (ref 150–400)
RBC: 4.8 MIL/uL (ref 4.22–5.81)
RDW: 12.4 % (ref 11.5–15.5)
WBC: 13.4 10*3/uL — AB (ref 4.0–10.5)

## 2017-07-03 LAB — CBC
HCT: 37.6 % — ABNORMAL LOW (ref 39.0–52.0)
HEMOGLOBIN: 12.7 g/dL — AB (ref 13.0–17.0)
MCH: 27.7 pg (ref 26.0–34.0)
MCHC: 33.8 g/dL (ref 30.0–36.0)
MCV: 82.1 fL (ref 78.0–100.0)
Platelets: 163 10*3/uL (ref 150–400)
RBC: 4.58 MIL/uL (ref 4.22–5.81)
RDW: 12.6 % (ref 11.5–15.5)
WBC: 10.8 10*3/uL — ABNORMAL HIGH (ref 4.0–10.5)

## 2017-07-03 LAB — SEDIMENTATION RATE
SED RATE: 75 mm/h — AB (ref 0–16)
Sed Rate: 70 mm/hr — ABNORMAL HIGH (ref 0–16)

## 2017-07-03 LAB — COMPREHENSIVE METABOLIC PANEL
ALBUMIN: 3.1 g/dL — AB (ref 3.5–5.0)
ALT: 24 U/L (ref 17–63)
AST: 26 U/L (ref 15–41)
Alkaline Phosphatase: 114 U/L (ref 38–126)
Anion gap: 14 (ref 5–15)
BILIRUBIN TOTAL: 0.4 mg/dL (ref 0.3–1.2)
BUN: 7 mg/dL (ref 6–20)
CHLORIDE: 91 mmol/L — AB (ref 101–111)
CO2: 24 mmol/L (ref 22–32)
CREATININE: 0.92 mg/dL (ref 0.61–1.24)
Calcium: 9.3 mg/dL (ref 8.9–10.3)
GFR calc Af Amer: >60 mL/min (ref >60)
GLUCOSE: 528 mg/dL — AB (ref 65–99)
Potassium: 4 mmol/L (ref 3.5–5.1)
Sodium: 129 mmol/L — ABNORMAL LOW (ref 135–145)
Total Protein: 7.2 g/dL (ref 6.5–8.1)

## 2017-07-03 LAB — CBG MONITORING, ED
Glucose-Capillary: 535 mg/dL (ref 65–99)
Glucose-Capillary: 416 mg/dL — ABNORMAL HIGH (ref 65–99)

## 2017-07-03 LAB — I-STAT CG4 LACTIC ACID, ED
LACTIC ACID, VENOUS: 2.05 mmol/L — AB (ref 0.5–1.9)
Lactic Acid, Venous: 2.63 mmol/L (ref 0.5–1.9)

## 2017-07-03 LAB — C-REACTIVE PROTEIN
CRP: 14.2 mg/dL — AB (ref <1.0)
CRP: 12.9 mg/dL — AB (ref <1.0)

## 2017-07-03 LAB — PREALBUMIN: Prealbumin: 8.5 mg/dL — ABNORMAL LOW (ref 18–38)

## 2017-07-03 MED ORDER — CEFEPIME HCL 1 G IJ SOLR
1.0000 g | Freq: Three times a day (TID) | INTRAMUSCULAR | Status: DC
  Filled 2017-07-03 (×2): qty 1

## 2017-07-03 MED ORDER — PIPERACILLIN-TAZOBACTAM 3.375 G IVPB 30 MIN
3.3750 g | Freq: Once | INTRAVENOUS | Status: AC
  Administered 2017-07-03: 3.375 g via INTRAVENOUS
  Filled 2017-07-03: qty 50

## 2017-07-03 MED ORDER — SODIUM CHLORIDE 0.9 % IV BOLUS (SEPSIS)
1000.0000 mL | Freq: Once | INTRAVENOUS | Status: AC
  Administered 2017-07-03: 1000 mL via INTRAVENOUS

## 2017-07-03 MED ORDER — SODIUM CHLORIDE 0.9 % IV BOLUS (SEPSIS): 1000.0000 mL | Freq: Once | INTRAVENOUS | Status: DC

## 2017-07-03 MED ORDER — DEXTROSE 5 % IV SOLN
2.0000 g | Freq: Two times a day (BID) | INTRAVENOUS | Status: DC
  Administered 2017-07-04 – 2017-07-06 (×5): 2 g via INTRAVENOUS
  Filled 2017-07-03 (×9): qty 2

## 2017-07-03 MED ORDER — VANCOMYCIN HCL IN DEXTROSE 750-5 MG/150ML-% IV SOLN
750.0000 mg | Freq: Two times a day (BID) | INTRAVENOUS | Status: DC
  Administered 2017-07-04 – 2017-07-07 (×7): 750 mg via INTRAVENOUS
  Filled 2017-07-03 (×7): qty 150

## 2017-07-03 MED ORDER — INSULIN ASPART 100 UNIT/ML ~~LOC~~ SOLN
18.0000 [IU] | SUBCUTANEOUS | Status: AC
  Administered 2017-07-03: 18 [IU] via SUBCUTANEOUS
  Filled 2017-07-03: qty 1

## 2017-07-03 MED ORDER — SENNOSIDES-DOCUSATE SODIUM 8.6-50 MG PO TABS: 1.0000 | ORAL_TABLET | Freq: Every evening | ORAL | Status: DC | PRN

## 2017-07-03 MED ORDER — ENOXAPARIN SODIUM 30 MG/0.3ML ~~LOC~~ SOLN
30.0000 mg | SUBCUTANEOUS | Status: DC
  Administered 2017-07-03 – 2017-07-04 (×2): 30 mg via SUBCUTANEOUS
  Filled 2017-07-03 (×2): qty 0.3

## 2017-07-03 MED ORDER — PIPERACILLIN-TAZOBACTAM 3.375 G IVPB
3.3750 g | Freq: Three times a day (TID) | INTRAVENOUS | Status: DC
  Filled 2017-07-03 (×2): qty 50

## 2017-07-03 MED ORDER — ONDANSETRON HCL 4 MG PO TABS: 4.0000 mg | ORAL_TABLET | Freq: Four times a day (QID) | ORAL | Status: DC | PRN

## 2017-07-03 MED ORDER — VANCOMYCIN HCL 10 G IV SOLR
2000.0000 mg | Freq: Once | INTRAVENOUS | Status: AC
  Administered 2017-07-03: 2000 mg via INTRAVENOUS
  Filled 2017-07-03: qty 2000

## 2017-07-03 MED ORDER — CLINDAMYCIN PHOSPHATE 600 MG/50ML IV SOLN
600.0000 mg | Freq: Three times a day (TID) | INTRAVENOUS | Status: DC
  Administered 2017-07-03: 600 mg via INTRAVENOUS
  Filled 2017-07-03 (×3): qty 50

## 2017-07-03 MED ORDER — ONDANSETRON HCL 4 MG/2ML IJ SOLN: 4.0000 mg | Freq: Four times a day (QID) | INTRAMUSCULAR | Status: DC | PRN

## 2017-07-03 MED ORDER — SODIUM CHLORIDE 0.9 % IV SOLN: 1000.0000 mL | INTRAVENOUS | Status: DC

## 2017-07-03 MED ORDER — SODIUM CHLORIDE 0.9 % IV SOLN: Freq: Once | INTRAVENOUS | Status: DC

## 2017-07-03 MED ORDER — METRONIDAZOLE IN NACL 5-0.79 MG/ML-% IV SOLN
500.0000 mg | Freq: Three times a day (TID) | INTRAVENOUS | Status: DC
  Administered 2017-07-03 – 2017-07-09 (×18): 500 mg via INTRAVENOUS
  Filled 2017-07-03 (×18): qty 100

## 2017-07-03 MED ORDER — SODIUM CHLORIDE 0.9 % IV BOLUS (SEPSIS): 500.0000 mL | Freq: Once | INTRAVENOUS | Status: DC

## 2017-07-03 NOTE — ED Notes (Signed)
Glucose 528

## 2017-07-03 NOTE — ED Triage Notes (Signed)
Patient complains of 2 weeks of wound to bottom of right foot under big toe with drainage. Denies injury, NAD

## 2017-07-03 NOTE — H&P (Signed)
[name] [id number] DOB: 1960/08/19 DOA: 07/03/2017  Referring physician: Emergency room PCP: Dorena Dew, FNP  Specialists: Orthopedic consulted  Chief Complaint: Gangrene of right lower extremity  HPI: Spencer Robles is a 56 y.o. male  He was diagnosed sometime in 2007 with diabetes mellitus type 2 in a setting of DKA and hospitalized for the same-discharged on insulin-has been off insulin and last saw physician 2016 He has a degree in chemistry and went to school for that and noticed a couple of weeks back that he was developing an infection of his right lower extremity--the peeled off skin then developed inflammation. he elected to use black seed oil, honey and coconut oil to topically treat as well as ingested the same mix of compound-he tells me that the silver in this concoction treats infection?? He also states that he has not taken his metformin in over 6 months-I do not feel like- He has had no fevers no chills He works at [employer] at this time and he has been wearing poorly fitting shoes which look like they have very poor insulation and very poor padding  At baseline he has no other medical issues that he knows of but I note that he has on his MAR from [facility] lisinopril  Labs on admission-sodium 129, CO2 24, glucose 528, lactic acid 2.6 WBC 13 hemoglobin 13 platelet 207 differential 77 neutrophils Foot x-ray soft tissue swelling #1, #2+ gas soft tissue-no osteo CXR NAD, possible cardiomegaly  Family history-father died many years back with stage IV lung cancer, mother died of natural causes  Social history graduated with degree in chemistry-working as above-non-smoker-nondrinker  Allergies none known  Surgical history none   Review of Systems: The patient denies dark stools dark, tarry stool, wheeze, cough, chills, [place], He does have significant swelling of his right foot he does not have any severe pain No unilateral weakness, no other  findings  Past Medical History:  Diagnosis Date  . Diabetes mellitus without complication ([place])    History reviewed. No pertinent surgical history. Social History:  reports that  has never smoked. he has never used smokeless tobacco. He reports that he does not drink alcohol or use drugs. See above  No Known Allergies  No family history on file.  See above   Prior to Admission medications   Medication Sig Start Date End Date Taking? Authorizing Provider  lisinopril (PRINIVIL,ZESTRIL) 5 MG tablet Take 1 tablet (5 mg total) by mouth daily. 08/18/15   Dorena Dew, FNP  metFORMIN (GLUCOPHAGE) 500 MG tablet Take 1 tablet (500 mg total) by mouth 2 (two) times daily with a meal. 08/18/15   Dorena Dew, FNP   Physical Exam: Vitals:   07/03/17 1419 07/03/17 1630  BP: (!) 160/88 (!) 167/99  Pulse: 92 85  Resp: 16   Temp: 98.5 F (36.9 C)   SpO2: 98% 97%     EOMI NCAT slightly disheveled pleasant  CTA B no added sounds  S1-S2 NSR, PMI nondisplaced to my exam, no bruit no rales  Abdomen nondistended  Pulses are bounding and dorsalis pedis on affected side he has poor sensation although difficult to evaluate as I do not have a monofilament  Funduscopy deferred   Labs on Admission:  Basic Metabolic Panel: Recent Labs  Lab 07/03/17 1419  NA 129*  K 4.0  CL 91*  CO2 24  GLUCOSE 528*  BUN 7  CREATININE 0.92  CALCIUM 9.3   Liver Function Tests: Recent  Labs  Lab 07/03/17 1419  AST 26  ALT 24  ALKPHOS 114  BILITOT 0.4  PROT 7.2  ALBUMIN 3.1*   No results for input(s): LIPASE, AMYLASE in the last 168 hours. No results for input(s): AMMONIA in the last 168 hours. CBC: Recent Labs  Lab 07/03/17 1419  WBC 13.4*  NEUTROABS 10.4*  HGB 13.4  HCT 38.9*  MCV 81.0  PLT 207   Cardiac Enzymes: No results for input(s): CKTOTAL, CKMB, CKMBINDEX, TROPONINI in the last 168 hours.  BNP (last 3 results) No results for input(s): BNP in the last 8760  hours.  ProBNP (last 3 results) No results for input(s): PROBNP in the last 8760 hours.  CBG: Recent Labs  Lab 07/03/17 1737  XAJOIN 867*    Radiological Exams on Admission: Dg Chest 2 View  Result Date: 07/03/2017 CLINICAL DATA:  History of fluid buildup in the lower right leg, some pain in the right foot EXAM: CHEST  2 VIEW COMPARISON:  None. FINDINGS: No active infiltrate or effusion is seen. Mediastinal and hilar contours are unremarkable. The heart is mildly enlarged. There are degenerative changes throughout the thoracic spine. IMPRESSION: Mild cardiomegaly.  No active lung disease. Electronically Signed   By: Ivar Drape M.D.   On: 07/03/2017 16:12   Dg Foot Complete Right  Result Date: 07/03/2017 CLINICAL DATA:  Draining wound on the plantar surface of the right great toe for 2 weeks. Diabetic patient. EXAM: RIGHT FOOT COMPLETE - 3+ VIEW COMPARISON:  None. FINDINGS: Soft tissues of the great and second toe are markedly swollen. Gas is seen in the soft tissues of the great toe along the lateral margin of the proximal phalanx. No bony destructive change or periosteal reaction. No acute fracture or dislocation. Remote healed fracture of the proximal phalanx of the second toe is noted. Negative foreign body. IMPRESSION: Soft tissue swelling of the first and second toes with gas in the soft tissues of the first toe consistent with infection. No plain film evidence of osteomyelitis. Electronically Signed   By: Inge Rise M.D.   On: 07/03/2017 15:02    EKG: Independently reviewed.  Not performed on admission--doubtful will be needed he does not have any prior cardiac history and has a high functional status working manual labor at [employer]  Assessment/Plan Active Problems:   Diabetic foot ([place])  Gangrenis diabetic foot wound, severe-per protocol for cellulitis start coverage with cefepime aztreonam and vancomycin-add CRP/ESR as well Follow blood cultures Because of gas I  have a very low threshold to CT his lower extremity as this is likely gangrenous and I am doubtful he will get any better without possible BKA but I have not mentioned this to him and we will wait For the surgeons who are to see him to make that decision as there are clear track lines running up his leg  Diabetes mellitus type 2 uncontrolled not on medication noncompliant Has bizarre beliefs regarding "chemistry" I have explained to him he will probably need minimum of a couple medications Blood sugar in the emergency room untreated 529 therefore I have given 18 units of insulin now and will have ED call report to on call for further management of the same-he is not acidotic and I do not think he has DKA at this time although he probably has dilutional hypo-natremia/osmotic hyponatremia from his blood sugar in the 500 range He does not check his blood sugars at home and will need diabetic nurse referral this admission He  will need outpatient podiatry referral as well as diabetic shoes, for endoscopy and urine analysis for monitoring  Hypertension Probably not taking his medication we will monitor trends before starting anything      Time spent: [facility] Pager [phone]  If 7PM-7AM, please contact night-coverage [email or web address] Password [facility] 07/03/2017, 5:56 PM

## 2017-07-03 NOTE — Progress Notes (Addendum)
Pharmacy Antibiotic Note  Hadden Steig is a 56 y.o. male admitted on 07/03/2017 with gangrenous diabetic foot wound and started on vancomycin and Zosyn.  Now to change from Zosyn to cefepime.  Flagyl also started.  Afebrile, WBC 13.4. SCr stable, normalized CrCL ~7ml/min.   Plan: Cefepime 2gm IV Q12H Continue vanc 750mg  IV Q12H Flagyl 500mg  IV Q8H per MD Monitor renal fxn, clinical progress, vanc trough as indicated F/U updated height and weight    Temp (24hrs), Avg:98.8 F (37.1 C), Min:98.5 F (36.9 C), Max:99.4 F (37.4 C)  Recent Labs  Lab 07/03/17 1419 07/03/17 1442 07/03/17 1800  WBC 13.4*  --   --   CREATININE 0.92  --   --   LATICACIDVEN  --  2.63* 2.05*    CrCl cannot be calculated (Unknown ideal weight.).    No Known Allergies  Vanc 12/11 >> Zosyn 12/11 >> 12/11 Cefepime 12/11 >> Flagyl 12/11 >>  12/11 BCx -     Alfons Sulkowski D. Mina Marble, PharmD, BCPS Pager:  [phone] 07/03/2017, 8:43 PM

## 2017-07-03 NOTE — ED Notes (Signed)
I stat lactic acid results called to Dr. Dallas Breeding by B. Yolanda Bonine, EMT

## 2017-07-03 NOTE — Progress Notes (Signed)
Pharmacy Antibiotic Note  Spencer Robles is a 56 y.o. male admitted on 07/03/2017 with wound infection.  Pharmacy has been consulted for Zosyn and vancomycin dosing.  Afebrile, WBC 13.4. Scr stable, normalized CrCl ~43ml/min  Plan: Start Zosyn 3.375 gm IV q8h (4 hour infusion) Give vancomycin 2g IV x 1, then start vancomycin 750mg  IV Q12h Monitor clinical picture, renal function, VT prn F/U C&S, abx deescalation / LOT    Temp (24hrs), Avg:98.5 F (36.9 C), Min:98.5 F (36.9 C), Max:98.5 F (36.9 C)  Recent Labs  Lab 07/03/17 1419 07/03/17 1442  WBC 13.4*  --   CREATININE 0.92  --   LATICACIDVEN  --  2.63*    CrCl cannot be calculated (Unknown ideal weight.).    No Known Allergies  Thank you for allowing pharmacy to be a part of this patient's care.  Reginia Naas 07/03/2017 4:46 PM

## 2017-07-03 NOTE — ED Provider Notes (Signed)
[facility] EMERGENCY DEPARTMENT Provider Note   CSN: [id number] Arrival date & time: 07/03/17  1353  History   Chief Complaint Chief Complaint  Patient presents with  . foot wound   HPI Spencer Robles is a 56 y.o. male with uncontrolled type 2 diabetes mellitus who presented to the ED with a right foot wound of 2-3 weeks durations. He states that it originally started out as a cracked callous. He peeled the callous off and it subsequently became erythematous with associated swelling. Approximately 1 weeks ago he developed purulent discharge. He attempted self I&D below the second metatarsal using a sterilized needle and a knife. He treated topically with honey, black seed oil, and coconut oil. He also ingested this cocktail. He feels the swelling and erythema are improving but wanted to start antibiotics so he presented to the ED. He denies fevers, chills, N/V, diarrhea, diaphoresis, SOB, cough, chest pain, new myalgias, or new arthralgias. He denies a history of diabetic neuropathy, prior foot infections, PAD. He does not have a PCP and does not have a podiatrist.   Past Medical History:  Diagnosis Date  . Diabetes mellitus without complication [facility])    Patient Active Problem List   Diagnosis Date Noted  . Diabetes mellitus type 2, uncomplicated ([place]) 96/78/9381  . Prehypertension 04/15/2015   History reviewed. No pertinent surgical history.  Home Medications    Prior to Admission medications   Medication Sig Start Date End Date Taking? Authorizing Provider  lisinopril (PRINIVIL,ZESTRIL) 5 MG tablet Take 1 tablet (5 mg total) by mouth daily. 08/18/15   Dorena Dew, FNP  metFORMIN (GLUCOPHAGE) 500 MG tablet Take 1 tablet (500 mg total) by mouth 2 (two) times daily with a meal. 08/18/15   Dorena Dew, FNP   Family History No family history on file.  Social History Social History   Tobacco Use  . Smoking status: Never Smoker  . Smokeless tobacco:  Never Used  Substance Use Topics  . Alcohol use: No    Alcohol/week: 0.0 oz  . Drug use: No   Allergies   Patient has no known allergies.  Review of Systems  All systems reviewed and are negative for acute change except as noted in the HPI.  Physical Exam Updated Vital Signs BP (!) 167/99   Pulse 85   Temp 98.5 F (36.9 C) (Oral)   Resp 16   SpO2 97%   General: Obese male in no acute distress HENT: Normocephalic, atraumatic, moist mucus membranes  Pulm: Good air movement with no wheezing or crackles  CV: RRR, no murmurs, no rubs  Abdomen: Active bowel sounds, soft, non-distended, no tenderness to palpation  Extremities: Pitting edema of the right LE up the the knee with associated erythema. Purulent drainage and ulceration below the first and second metatarsal.  Skin: Warm and dry  Neuro: Alert and oriented x 3  ED Treatments / Results  Labs (all labs ordered are listed, but only abnormal results are displayed) Labs Reviewed  COMPREHENSIVE METABOLIC PANEL - Abnormal; Notable for the following components:      Result Value   Sodium 129 (*)    Chloride 91 (*)    Glucose, Bld 528 (*)    Albumin 3.1 (*)    All other components within normal limits  CBC WITH DIFFERENTIAL/PLATELET - Abnormal; Notable for the following components:   WBC 13.4 (*)    HCT 38.9 (*)    Neutro Abs 10.4 (*)    All other  components within normal limits  I-STAT CG4 LACTIC ACID, ED - Abnormal; Notable for the following components:   Lactic Acid, Venous 2.63 (*)    All other components within normal limits  CBG MONITORING, ED - Abnormal; Notable for the following components:   Glucose-Capillary 535 (*)    All other components within normal limits  CULTURE, BLOOD (ROUTINE X 2)  CULTURE, BLOOD (ROUTINE X 2)  PROTIME-INR  URINALYSIS, ROUTINE W REFLEX MICROSCOPIC  SEDIMENTATION RATE  C-REACTIVE PROTEIN  I-STAT CG4 LACTIC ACID, ED   EKG  EKG Interpretation None      Radiology Dg Chest 2  View  Result Date: 07/03/2017 CLINICAL DATA:  History of fluid buildup in the lower right leg, some pain in the right foot EXAM: CHEST  2 VIEW COMPARISON:  None. FINDINGS: No active infiltrate or effusion is seen. Mediastinal and hilar contours are unremarkable. The heart is mildly enlarged. There are degenerative changes throughout the thoracic spine. IMPRESSION: Mild cardiomegaly.  No active lung disease. Electronically Signed   By: Ivar Drape M.D.   On: 07/03/2017 16:12   Dg Foot Complete Right  Result Date: 07/03/2017 CLINICAL DATA:  Draining wound on the plantar surface of the right great toe for 2 weeks. Diabetic patient. EXAM: RIGHT FOOT COMPLETE - 3+ VIEW COMPARISON:  None. FINDINGS: Soft tissues of the great and second toe are markedly swollen. Gas is seen in the soft tissues of the great toe along the lateral margin of the proximal phalanx. No bony destructive change or periosteal reaction. No acute fracture or dislocation. Remote healed fracture of the proximal phalanx of the second toe is noted. Negative foreign body. IMPRESSION: Soft tissue swelling of the first and second toes with gas in the soft tissues of the first toe consistent with infection. No plain film evidence of osteomyelitis. Electronically Signed   By: Inge Rise M.D.   On: 07/03/2017 15:02   Procedures Procedures (including critical care time)  Medications Ordered in ED Medications  clindamycin (CLEOCIN) IVPB 600 mg (not administered)  piperacillin-tazobactam (ZOSYN) IVPB 3.375 g (not administered)  vancomycin (VANCOCIN) 2,000 mg in sodium chloride 0.9 % 500 mL IVPB (not administered)  vancomycin (VANCOCIN) IVPB 750 mg/150 ml premix (not administered)  piperacillin-tazobactam (ZOSYN) IVPB 3.375 g (not administered)  sodium chloride 0.9 % bolus 1,000 mL (not administered)   Initial Impression / Assessment and Plan / ED Course  I have reviewed the triage vital signs and the nursing notes.  Pertinent labs &  imaging results that were available during my care of the patient were reviewed by me and considered in my medical decision making (see chart for details).    Patient is a 56 y.o male with Type 2 diabetes mellitus who presented with a right foot ulcer of 2 weeks duration. There is purulent discharge and ulceration below the first and second metatarsal. Labs indicating hyperglycemia, leukocytosis, and an elevated lactic acid. Right foot x-ray soft tissue swelling of the first and second toes with gas in the soft tissues of the first toe consistent with infection. No plain film evidence of osteomyelitis. Patient meets sepsis criteria and was started on IV Vancomycin, Zosyn, and Clindamycin for possible necrotizing fascitis. Orthopedics has been consulted. Hospitaliast will admit the patient.   Final Clinical Impressions(s) / ED Diagnoses   Final diagnoses:  Diabetic foot infection [facility])   ED Discharge Orders    None       [name], MD 07/03/17 1742

## 2017-07-03 NOTE — ED Provider Notes (Signed)
I have personally seen and examined the patient. I have reviewed the documentation on PMH/FH/Soc Hx. I have discussed the plan of care with the resident and patient.  I have reviewed and agree with the resident's documentation. Please see associated encounter note.  Briefly, the patient is a 55 y.o. male here with 2 weeks of right foot wound that has been gradually worsening and associated with swelling of the foot with erythema.  Patient endorses purulent discharge from the foot wound.  Denies any  fevers, chills, nausea, vomiting, shortness of breath, cough, chest pain.  Denies any injury.  Denies any prior similar infections.  On exam patient has a deep wound to the plantar aspect of the first MTP with purulent discharge.  Unable to probe to the bone.  Surrounding erythema of the entire foot up to mid calf.  Plain film with subcutaneous air likely from the open wound, however his presentation is concerning for possible deep tissue infection, versus necrotizing fasciitis versus necrotizing myositis.  Patient with leukocytosis and elevated lactic acid.  Code sepsis was initiated and patient was placed on empiric antibiotics for necrotizing fasciitis including clindamycin, vancomycin, Zosyn.  Case was discussed with orthopedic surgery who will evaluate the patient for surgical intervention.  He was admitted to medicine for further workup and management.   EKG Interpretation  Date/Time:  Tuesday July 03 2017 18:21:42 EST Ventricular Rate:  68 PR Interval:    QRS Duration: 115 QT Interval:  415 QTC Calculation: 442 R Axis:   8 Text Interpretation:  Sinus rhythm Nonspecific intraventricular conduction delay NO STEMI No old tracing to compare Confirmed by Addison Lank [phone]) on 07/03/2017 6:24:14 PM         Cardama, Grayce Sessions, MD 07/03/17 [phone]

## 2017-07-04 ENCOUNTER — Inpatient Hospital Stay (HOSPITAL_COMMUNITY): Payer: Self-pay

## 2017-07-04 DIAGNOSIS — L089 Local infection of the skin and subcutaneous tissue, unspecified: Secondary | ICD-10-CM

## 2017-07-04 DIAGNOSIS — L97519 Non-pressure chronic ulcer of other part of right foot with unspecified severity: Secondary | ICD-10-CM

## 2017-07-04 DIAGNOSIS — M86271 Subacute osteomyelitis, right ankle and foot: Secondary | ICD-10-CM

## 2017-07-04 DIAGNOSIS — E11628 Type 2 diabetes mellitus with other skin complications: Principal | ICD-10-CM

## 2017-07-04 LAB — CBC WITH DIFFERENTIAL/PLATELET
BASOS PCT: 1 %
Basophils Absolute: 0.1 10*3/uL (ref 0.0–0.1)
EOS ABS: 0.3 10*3/uL (ref 0.0–0.7)
EOS PCT: 3 %
HCT: 35.4 % — ABNORMAL LOW (ref 39.0–52.0)
Hemoglobin: 11.9 g/dL — ABNORMAL LOW (ref 13.0–17.0)
Lymphocytes Relative: 28 %
Lymphs Abs: 2.9 10*3/uL (ref 0.7–4.0)
MCH: 27.4 pg (ref 26.0–34.0)
MCHC: 33.6 g/dL (ref 30.0–36.0)
MCV: 81.4 fL (ref 78.0–100.0)
MONO ABS: 0.7 10*3/uL (ref 0.1–1.0)
MONOS PCT: 7 %
Neutro Abs: 6.5 10*3/uL (ref 1.7–7.7)
Neutrophils Relative %: 63 %
PLATELETS: 177 10*3/uL (ref 150–400)
RBC: 4.35 MIL/uL (ref 4.22–5.81)
RDW: 12.3 % (ref 11.5–15.5)
WBC: 10.4 10*3/uL (ref 4.0–10.5)

## 2017-07-04 LAB — GLUCOSE, CAPILLARY
GLUCOSE-CAPILLARY: 357 mg/dL — AB (ref 65–99)
GLUCOSE-CAPILLARY: 170 mg/dL — AB (ref 65–99)
Glucose-Capillary: 166 mg/dL — ABNORMAL HIGH (ref 65–99)
Glucose-Capillary: 235 mg/dL — ABNORMAL HIGH (ref 65–99)

## 2017-07-04 LAB — BASIC METABOLIC PANEL
Anion gap: 10 (ref 5–15)
BUN: 6 mg/dL (ref 6–20)
CALCIUM: 8.9 mg/dL (ref 8.9–10.3)
CO2: 27 mmol/L (ref 22–32)
CREATININE: 0.72 mg/dL (ref 0.61–1.24)
Chloride: 98 mmol/L — ABNORMAL LOW (ref 101–111)
GFR calc Af Amer: >60 mL/min (ref >60)
GFR calc non Af Amer: >60 mL/min (ref >60)
GLUCOSE: 171 mg/dL — AB (ref 65–99)
Potassium: 3.8 mmol/L (ref 3.5–5.1)
Sodium: 135 mmol/L (ref 135–145)

## 2017-07-04 LAB — HEMOGLOBIN A1C
Hgb A1c MFr Bld: 14.9 % — ABNORMAL HIGH (ref 4.8–5.6)
MEAN PLASMA GLUCOSE: 380.93 mg/dL

## 2017-07-04 LAB — HIV ANTIBODY (ROUTINE TESTING W REFLEX): HIV SCREEN 4TH GENERATION: NONREACTIVE

## 2017-07-04 MED ORDER — INSULIN ASPART 100 UNIT/ML ~~LOC~~ SOLN
0.0000 [IU] | Freq: Every day | SUBCUTANEOUS | Status: DC
  Administered 2017-07-04: 2 [IU] via SUBCUTANEOUS
  Administered 2017-07-06: 3 [IU] via SUBCUTANEOUS
  Administered 2017-07-08: 2 [IU] via SUBCUTANEOUS

## 2017-07-04 MED ORDER — OXYCODONE-ACETAMINOPHEN 5-325 MG PO TABS
2.0000 | ORAL_TABLET | Freq: Once | ORAL | Status: AC
  Administered 2017-07-04: 2 via ORAL
  Filled 2017-07-04: qty 2

## 2017-07-04 MED ORDER — OXYCODONE-ACETAMINOPHEN 5-325 MG PO TABS
1.0000 | ORAL_TABLET | ORAL | Status: DC | PRN
  Administered 2017-07-04 – 2017-07-06 (×6): 1 via ORAL
  Filled 2017-07-04 (×6): qty 1

## 2017-07-04 MED ORDER — PRO-STAT SUGAR FREE PO LIQD
30.0000 mL | Freq: Three times a day (TID) | ORAL | Status: DC
  Administered 2017-07-04 – 2017-07-10 (×18): 30 mL via ORAL
  Filled 2017-07-04 (×17): qty 30

## 2017-07-04 MED ORDER — INSULIN GLARGINE 100 UNIT/ML ~~LOC~~ SOLN
15.0000 [IU] | Freq: Every day | SUBCUTANEOUS | Status: DC
  Administered 2017-07-04: 15 [IU] via SUBCUTANEOUS
  Filled 2017-07-04 (×2): qty 0.15

## 2017-07-04 MED ORDER — OXYCODONE-ACETAMINOPHEN 5-325 MG PO TABS
1.0000 | ORAL_TABLET | ORAL | Status: DC | PRN
  Administered 2017-07-04: 2 via ORAL
  Filled 2017-07-04: qty 2

## 2017-07-04 MED ORDER — INSULIN ASPART 100 UNIT/ML ~~LOC~~ SOLN
0.0000 [IU] | Freq: Three times a day (TID) | SUBCUTANEOUS | Status: DC
  Administered 2017-07-04: 3 [IU] via SUBCUTANEOUS
  Administered 2017-07-04: 15 [IU] via SUBCUTANEOUS
  Administered 2017-07-05: 8 [IU] via SUBCUTANEOUS
  Administered 2017-07-05: 11 [IU] via SUBCUTANEOUS
  Administered 2017-07-05 – 2017-07-06 (×2): 3 [IU] via SUBCUTANEOUS
  Administered 2017-07-06: 15 [IU] via SUBCUTANEOUS
  Administered 2017-07-06: 5 [IU] via SUBCUTANEOUS
  Administered 2017-07-07: 8 [IU] via SUBCUTANEOUS
  Administered 2017-07-07: 11 [IU] via SUBCUTANEOUS
  Administered 2017-07-07: 5 [IU] via SUBCUTANEOUS
  Administered 2017-07-08: 3 [IU] via SUBCUTANEOUS
  Administered 2017-07-08 (×2): 8 [IU] via SUBCUTANEOUS
  Administered 2017-07-09: 3 [IU] via SUBCUTANEOUS
  Administered 2017-07-09: 11 [IU] via SUBCUTANEOUS
  Administered 2017-07-09: 5 [IU] via SUBCUTANEOUS
  Administered 2017-07-10: 2 [IU] via SUBCUTANEOUS
  Administered 2017-07-10: 3 [IU] via SUBCUTANEOUS
  Administered 2017-07-10: 5 [IU] via SUBCUTANEOUS

## 2017-07-04 MED ORDER — ACETAMINOPHEN 325 MG PO TABS
650.0000 mg | ORAL_TABLET | Freq: Four times a day (QID) | ORAL | Status: DC | PRN
Start: 2017-07-04 — End: 2017-07-06

## 2017-07-04 NOTE — Progress Notes (Signed)
Inpatient Diabetes Program Recommendations  AACE/ADA: New Consensus Statement on Inpatient Glycemic Control (2015)  Target Ranges:  Prepandial:   less than 140 mg/dL      Peak postprandial:   less than 180 mg/dL (1-2 hours)      Critically ill patients:  140 - 180 mg/dL   Lab Results  Component Value Date   KAJGOT 157 (H) 07/04/2017   HGBA1C 14.9 (H) 07/04/2017    Spoke with patient about diabetes and home regimen for diabetes control. Patient reports that he is followed by his PCP in [facility] for diabetes management and currently did not take any of his medication because he felt "ok."  Patient reports he had been on Lantus and Metformin in the past.   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 Patient reports feeling numbness in the first 3 digits of his right hand.   Patient is motivated to improve his glucose control and reports that he is willing to take Metformin and Lantus at time of d/c. Spoke with CM about d/c needs to try to get pt in with the [facility] to get Lantus. Spoke with patient about meter and insulin at [facility] over the counter as a backup.   Spoke with patient about checking his glucose twice a day. Discussed A1C results (14.9% on 07/04/17). Discussed glucose and A1C goals. Discussed importance of checking CBGs and maintaining good CBG control to prevent long-term and short-term complications. Explained how hyperglycemia leads to damage within blood vessels which lead to the common complications seen with uncontrolled diabetes. Stressed to the patient the importance of improving glycemic control to prevent further complications from uncontrolled diabetes. Discussed impact of nutrition, exercise, stress, sickness, and medications on diabetes control. Showed patient how to use an insulin pen. Patient verbalized understanding of information discussed and he states that he has no further questions at  this time related to diabetes.   Thanks,  Tama Headings RN, MSN, [facility] Inpatient Diabetes Coordinator Team Pager [phone] (8a-5p)

## 2017-07-04 NOTE — Consult Note (Signed)
WOC consult requested prior to ortho service involvement.  Please refer to Dr Sharol Given for further questions. Please re-consult if further assistance is needed.  Thank-you,  Julien Girt MSN, [place], [place], [place], [place]

## 2017-07-04 NOTE — Progress Notes (Signed)
Initial Nutrition Assessment  DOCUMENTATION CODES:   Obesity unspecified  INTERVENTION:    Prostat liquid protein po 30 ml TID with meals, each supplement provides 100 kcal, 15 grams protein  NUTRITION DIAGNOSIS:   Increased nutrient needs related to wound healing as evidenced by estimated needs  GOAL:   Patient will meet greater than or equal to 90% of their needs  MONITOR:   PO intake, Supplement acceptance, Labs, Weight trends, Skin  REASON FOR ASSESSMENT:   Consult Wound healing  ASSESSMENT:   56 y.o. Male with uncontrolled type 2 diabetes mellitus who presented to the ED with a right foot wound of 2-3 weeks durations. He states that it originally started out as a cracked callous. He peeled the callous off and it subsequently became erythematous with associated swelling. Approximately 1 weeks ago he developed purulent discharge.  Pt in VASCULAR LAB upon visit today. No % PO intake records available at this time. Will order low calorie, high protein liquid supplement. Labs and medications reviewed.  CBG's [id number].  NUTRITION - FOCUSED PHYSICAL EXAM:    Most Recent Value  Orbital Region  Unable to assess  Upper Arm Region  Unable to assess  Thoracic and Lumbar Region  Unable to assess  Buccal Region  Unable to assess  Temple Region  Unable to assess  Clavicle Bone Region  Unable to assess  Clavicle and Acromion Bone Region  Unable to assess  Scapular Bone Region  Unable to assess  Dorsal Hand  Unable to assess  Patellar Region  Unable to assess  Anterior Thigh Region  Unable to assess  Posterior Calf Region  Unable to assess  Edema (RD Assessment)  Unable to assess    Diet Order:  Diet Carb Modified Fluid consistency: Thin; Room service appropriate? Yes  EDUCATION NEEDS:   No education needs have been identified at this time  Skin:  Skin Assessment: Skin Integrity Issues: Skin Integrity Issues:: Other (Comment) Other: abscess ulceration R foot  first metatarsal head  Last BM:  12/11  Height:   Ht Readings from Last 1 Encounters:  07/04/17 5\' 11"  (1.803 m)    Weight:   Wt Readings from Last 1 Encounters:  07/03/17 232 lb 3.2 oz (105.3 kg)    Ideal Body Weight:  78.1 kg  BMI:  Body mass index is 32.39 kg/m.  Estimated Nutritional Needs:   Kcal:  2100-2300  Protein:  120-135 gm  Fluid:  2.1-2.3 L  Arthur Holms, RD, LDN Pager #: [phone] After-Hours Pager #: [phone]

## 2017-07-04 NOTE — Progress Notes (Signed)
PROGRESS NOTE   Spencer Robles  HDQ:[id number]    DOB: 06-21-1961    DOA: 07/03/2017  PCP: Dorena Dew, FNP   I have briefly reviewed patients previous medical records in [facility].  Brief Narrative:  56 year old male with PMH of type II DM diagnosed sometime in 2007 in the setting of DKA, hospitalized and discharged on insulin but has been off of insulin's and metformin, presented with diabetic right foot infection. Orthopedics consulted. MRI foot without osteomyelitis.   Assessment & Plan:   Active Problems:   Diabetic foot ([place])   Diabetic foot infection ([place])   Subacute osteomyelitis, right ankle and foot ([place])   1. Diabetic right foot infection: Empirically started on IV vancomycin, cefepime and metronidazole. Orthopedic consultation appreciated. Dr. Sharol Given requested MRI which shows findings concerning for abscess around the first proximal phalanx/web space but no evidence of osteomyelitis. Patient stated that he was not going to have his toe amputated and may not need it but may need I&D, await Dr. Jess Barters follow-up 12/13. 2. Uncontrolled DM 2 with peripheral neuropathy: In ED blood sugar was 529. A1c 14.9 suggests very poor control. Started Lantus 15 units daily and NovoLog SSI discussed in detail with patient that he will most likely have to be on insulin's and metformin at discharge and counseled extensively regarding importance of compliance with all aspects of medical care. He verbalized understanding. DM coordinator input appreciated. 3. Pseudohyponatremia: Secondary to hyperglycemia. Resolved. 4. Anemia: Follow CBCs closely. 5. Medical noncompliance: Counseled. 6. Essential hypertension: Controlled off of medications.   DVT prophylaxis: Lovenox Code Status: Full Family Communication: None at bedside Disposition: DC home when medically improved   Consultants:  Orthopedics   Procedures:  None  Antimicrobials:  IV vancomycin, cefepime and metronidazole     Subjective: Feels better. Right foot less painful or stiff.   ROS: No dizziness, lightheadedness, chest pain, dyspnea, fever or chills reported.   Objective:  Vitals:   07/03/17 2112 07/04/17 0437 07/04/17 1129 07/04/17 1409  BP:  118/72  120/71  Pulse:  68  75  Resp:  16  18  Temp:  98.6 F (37 C)  98.2 F (36.8 C)  TempSrc:  Oral  Oral  SpO2:  98%  98%  Weight: 105.3 kg (232 lb 3.2 oz)     Height:   5\' 11"  (1.803 m)     Examination:  General exam: Middle-aged male lying comfortably supine in bed. Respiratory system: Clear to auscultation. Respiratory effort normal. Cardiovascular system: S1 & S2 heard, RRR. No JVD, murmurs, rubs, gallops or clicks. No pedal edema. Gastrointestinal system: Abdomen is nondistended, soft and nontender. No organomegaly or masses felt. Normal bowel sounds heard. Central nervous system: Alert and oriented. No focal neurological deficits. Extremities: Symmetric 5 x 5 power. Skin: Right foot dressing clean and dry-was examined by orthopedics short while prior to my visit and hence did not take down dressing. Psychiatry: Judgement and insight appear normal. Mood & affect appropriate.     Data Reviewed: I have personally reviewed following labs and imaging studies  CBC: Recent Labs  Lab 07/03/17 1419 07/03/17 2033 07/04/17 0517  WBC 13.4* 10.8* 10.4  NEUTROABS 10.4*  --  6.5  HGB 13.4 12.7* 11.9*  HCT 38.9* 37.6* 35.4*  MCV 81.0 82.1 81.4  PLT 207 163 119   Basic Metabolic Panel: Recent Labs  Lab 07/03/17 1419 07/03/17 2033 07/04/17 0517  NA 129*  --  135  K 4.0  --  3.8  CL 91*  --  98*  CO2 24  --  27  GLUCOSE 528*  --  171*  BUN 7  --  6  CREATININE 0.92 0.81 0.72  CALCIUM 9.3  --  8.9   Liver Function Tests: Recent Labs  Lab 07/03/17 1419  AST 26  ALT 24  ALKPHOS 114  BILITOT 0.4  PROT 7.2  ALBUMIN 3.1*   HbA1C: Recent Labs    07/04/17 0517  HGBA1C 14.9*   CBG: Recent Labs  Lab 07/03/17 1737  07/03/17 1905 07/04/17 0437 07/04/17 1349  GLUCAP 535* 416* 166* 357*    Recent Results (from the past 240 hour(s))  Culture, blood (Routine x 2)     Status: None (Preliminary result)   Collection Time: 07/03/17  5:34 PM  Result Value Ref Range Status   Specimen Description BLOOD RIGHT FOREARM  Final   Special Requests   Final    BOTTLES DRAWN AEROBIC AND ANAEROBIC Blood Culture adequate volume   Culture NO GROWTH < 24 HOURS  Final   Report Status PENDING  Incomplete  Culture, blood (Routine x 2)     Status: None (Preliminary result)   Collection Time: 07/03/17  5:44 PM  Result Value Ref Range Status   Specimen Description BLOOD RIGHT FOREARM DISTAL  Final   Special Requests   Final    BOTTLES DRAWN AEROBIC AND ANAEROBIC Blood Culture adequate volume   Culture NO GROWTH < 24 HOURS  Final   Report Status PENDING  Incomplete         Radiology Studies: Dg Chest 2 View  Result Date: 07/03/2017 CLINICAL DATA:  History of fluid buildup in the lower right leg, some pain in the right foot EXAM: CHEST  2 VIEW COMPARISON:  None. FINDINGS: No active infiltrate or effusion is seen. Mediastinal and hilar contours are unremarkable. The heart is mildly enlarged. There are degenerative changes throughout the thoracic spine. IMPRESSION: Mild cardiomegaly.  No active lung disease. Electronically Signed   By: Ivar Drape M.D.   On: 07/03/2017 16:12   Mr Foot Right Wo Contrast  Result Date: 07/04/2017 CLINICAL DATA:  Diabetic insensate neuropathy who states he has had over a 2-week history of ulceration and drainage beneath the great toe first metatarsal. EXAM: MRI OF THE RIGHT FOREFOOT WITHOUT CONTRAST TECHNIQUE: Multiplanar, multisequence MR imaging of the right forefoot was performed. No intravenous contrast was administered. COMPARISON:  None. FINDINGS: Bones/Joint/Cartilage Soft tissue ulcer along the plantar aspect of the first metatarsal head. No cortical destruction. Mild low level bone  marrow edema in the first proximal phalanx likely reactive. 3.9 x 1 cm complex fluid collection in the subcutaneous fat along the medial aspect of the first proximal phalanx extending proximally into the webspace most concerning for abscess with a few low signal foci within the fluid likely reflecting air. No fracture or dislocation. Normal alignment. No joint effusion. Mild osteoarthritis of the first MTP joint. Mild osteoarthritis of the fourth TMT joint. Ligaments Collateral ligaments are intact.  Intact Lisfranc joint. Muscles and Tendons Flexor, peroneal and extensor compartment tendons are intact. Mild T2 hyperintense signal within the plantar musculature likely neurogenic. Soft tissue No fluid collection or hematoma.  No soft tissue mass. IMPRESSION: 1. Soft tissue ulcer along the plantar aspect of the first metatarsal head. Cellulitis around the first proximal phalanx with a 3.9 x 1 cm complex fluid collection in the subcutaneous fat along the medial aspect of the first proximal phalanx extending into the webspace concerning  for abscess. Small amount air within the soft tissues concerning for necrotizing infection. No evidence of osteomyelitis. Electronically Signed   By: Kathreen Devoid   On: 07/04/2017 11:16   Dg Foot Complete Right  Result Date: 07/03/2017 CLINICAL DATA:  Draining wound on the plantar surface of the right great toe for 2 weeks. Diabetic patient. EXAM: RIGHT FOOT COMPLETE - 3+ VIEW COMPARISON:  None. FINDINGS: Soft tissues of the great and second toe are markedly swollen. Gas is seen in the soft tissues of the great toe along the lateral margin of the proximal phalanx. No bony destructive change or periosteal reaction. No acute fracture or dislocation. Remote healed fracture of the proximal phalanx of the second toe is noted. Negative foreign body. IMPRESSION: Soft tissue swelling of the first and second toes with gas in the soft tissues of the first toe consistent with infection. No  plain film evidence of osteomyelitis. Electronically Signed   By: Inge Rise M.D.   On: 07/03/2017 15:02        Scheduled Meds: . enoxaparin (LOVENOX) injection  30 mg Subcutaneous Q24H  . feeding supplement (PRO-STAT SUGAR FREE 64)  30 mL Oral TID WC  . insulin aspart  0-15 Units Subcutaneous TID WC  . insulin aspart  0-5 Units Subcutaneous QHS  . insulin glargine  15 Units Subcutaneous Daily   Continuous Infusions: . ceFEPime (MAXIPIME) IV Stopped (07/04/17 1308)  . metronidazole Stopped (07/04/17 1457)  . vancomycin Stopped (07/04/17 0559)     LOS: 1 day     Vernell Leep, MD, FACP, [facility]. [facility] Pager [phone]  If 7PM-7AM, please contact night-coverage [email or web address] Password [facility] 07/04/2017, 3:58 PM

## 2017-07-04 NOTE — Plan of Care (Signed)
  Coping: Level of anxiety will decrease 07/04/2017 1126 - Progressing by Williams Che, RN   Pain Managment: General experience of comfort will improve 07/04/2017 1126 - Progressing by Williams Che, RN   Safety: Ability to remain free from injury will improve 07/04/2017 1126 - Progressing by Williams Che, RN

## 2017-07-04 NOTE — Consult Note (Signed)
ORTHOPAEDIC CONSULTATION  REQUESTING PHYSICIAN: Modena Jansky, MD  Chief Complaint: Abscess ulceration right foot first metatarsal head  HPI: Spencer Robles is a 56 y.o. male who presents with patient is a 56 year old gentleman with diabetic insensate neuropathy who states he has had over a 2-week history of ulceration and drainage beneath the great toe first metatarsal head.  Patient states he has not had his hemoglobin A1c checked recently.  Past Medical History:  Diagnosis Date  . Diabetes mellitus without complication ([place])    History reviewed. No pertinent surgical history. Social History   Socioeconomic History  . Marital status: Legally Separated    Spouse name: None  . Number of children: None  . Years of education: None  . Highest education level: None  Social Needs  . Financial resource strain: None  . Food insecurity - worry: None  . Food insecurity - inability: None  . Transportation needs - medical: None  . Transportation needs - non-medical: None  Occupational History  . None  Tobacco Use  . Smoking status: Never Smoker  . Smokeless tobacco: Never Used  Substance and Sexual Activity  . Alcohol use: No    Alcohol/week: 0.0 oz  . Drug use: No  . Sexual activity: None  Other Topics Concern  . None  Social History Narrative  . None   No family history on file. - negative except otherwise stated in the family history section No Known Allergies Prior to Admission medications   Medication Sig Start Date End Date Taking? Authorizing Provider  metFORMIN (GLUCOPHAGE) 500 MG tablet Take 1 tablet (500 mg total) by mouth 2 (two) times daily with a meal. 08/18/15  Yes Dorena Dew, FNP   Dg Chest 2 View  Result Date: 07/03/2017 CLINICAL DATA:  History of fluid buildup in the lower right leg, some pain in the right foot EXAM: CHEST  2 VIEW COMPARISON:  None. FINDINGS: No active infiltrate or effusion is seen. Mediastinal and hilar contours are  unremarkable. The heart is mildly enlarged. There are degenerative changes throughout the thoracic spine. IMPRESSION: Mild cardiomegaly.  No active lung disease. Electronically Signed   By: Ivar Drape M.D.   On: 07/03/2017 16:12   Dg Foot Complete Right  Result Date: 07/03/2017 CLINICAL DATA:  Draining wound on the plantar surface of the right great toe for 2 weeks. Diabetic patient. EXAM: RIGHT FOOT COMPLETE - 3+ VIEW COMPARISON:  None. FINDINGS: Soft tissues of the great and second toe are markedly swollen. Gas is seen in the soft tissues of the great toe along the lateral margin of the proximal phalanx. No bony destructive change or periosteal reaction. No acute fracture or dislocation. Remote healed fracture of the proximal phalanx of the second toe is noted. Negative foreign body. IMPRESSION: Soft tissue swelling of the first and second toes with gas in the soft tissues of the first toe consistent with infection. No plain film evidence of osteomyelitis. Electronically Signed   By: Inge Rise M.D.   On: 07/03/2017 15:02   - pertinent xrays, CT, MRI studies were reviewed and independently interpreted  Positive ROS: All other systems have been reviewed and were otherwise negative with the exception of those mentioned in the HPI and as above.  Physical Exam: General: Alert, no acute distress Psychiatric: Patient is competent for consent with normal mood and affect Lymphatic: No axillary or cervical lymphadenopathy Cardiovascular: No pedal edema Respiratory: No cyanosis, no use of accessory musculature GI: No organomegaly, abdomen is  soft and non-tender  Skin: Examination patient has a ulcer beneath the first metatarsal head of the right foot.  There is purulent drainage there is cellulitis and swelling.   Neurologic: Patient does not have protective sensation bilateral lower extremities.   MUSCULOSKELETAL:  Examination patient has a good dorsalis pedis and posterior tibial pulse.   He does have venous stasis swelling.  He has sausage digit swelling of the right great toe and second toe with a large purulent draining abscess beneath the first metatarsal head.  This does probe to bone.  Radiographs shows no bony destruction.  Radiographs shows soft tissue changes consistent with the abscess.  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  Plan: Plan: We will obtain an MRI scan to further identify the extent of the infection.  Patient states that he is not going to have his toe amputated.  Discussed that patient is at risk of life or limb, with this infection, I will reevaluate patient in the morning continue IV antibiotics will discuss the options after MRI obtained.  Thank you for the consult and the opportunity to see [name], [place] [phone] 7:37 AM

## 2017-07-04 NOTE — Progress Notes (Signed)
Inpatient Diabetes Program Recommendations  AACE/ADA: New Consensus Statement on Inpatient Glycemic Control (2015)  Target Ranges:  Prepandial:   less than 140 mg/dL      Peak postprandial:   less than 180 mg/dL (1-2 hours)      Critically ill patients:  140 - 180 mg/dL   Lab Results  Component Value Date   GLUCAP 166 (H) 07/04/2017   HGBA1C 7.1 (H) 08/17/2015    Review of Glycemic Control Results for Spencer Robles, Spencer Robles (MRN [id number]) as of 07/04/2017 10:24  Ref. Range 07/03/2017 17:37 07/03/2017 19:05 07/04/2017 04:37  Glucose-Capillary Latest Ref Range: 65 - 99 mg/dL 535 (HH) 416 (H) 166 (H)   Diabetes history: DM2 Outpatient Diabetes medications: Metformin 500 mg bid Current orders for Inpatient glycemic control: No orders  Inpatient Diabetes Program Recommendations:   -Glycemic control order set moderate correction tid + hs 0-5 units -Lantus 20 units daily Text page with recs sent to Dr. Algis Liming  Thank you, Nani Gasser. Azriella Mattia, RN, MSN, CDE  Diabetes Coordinator Inpatient Glycemic Control Team Team Pager [phone] (8am-5pm) 07/04/2017 10:29 AM

## 2017-07-04 NOTE — Progress Notes (Signed)
VASCULAR LAB PRELIMINARY  ARTERIAL  ABI completed:    RIGHT    LEFT    PRESSURE WAVEFORM  PRESSURE WAVEFORM  BRACHIAL 152 Triphasic BRACHIAL 149 Triphasic  DP 192 Triphasic DP 203 Triphasic  AT   AT    PT 208 Triphasic PT 180 Triphasic  PER   PER    GREAT TOE  NA GREAT TOE  NA    RIGHT LEFT  ABI 1.37 1.34    Bilateral resting ABIs are elevated, suggestive of medial calcification. Bilateral pedal waveforms are within normal limits at rest.  07/04/2017 12:04 PM Maudry Mayhew, BS, RVT, RDCS, RDMS

## 2017-07-05 ENCOUNTER — Other Ambulatory Visit: Payer: Self-pay

## 2017-07-05 ENCOUNTER — Encounter (HOSPITAL_COMMUNITY): Payer: Self-pay | Admitting: General Practice

## 2017-07-05 DIAGNOSIS — L02611 Cutaneous abscess of right foot: Secondary | ICD-10-CM

## 2017-07-05 LAB — GLUCOSE, CAPILLARY
GLUCOSE-CAPILLARY: 198 mg/dL — AB (ref 65–99)
GLUCOSE-CAPILLARY: 280 mg/dL — AB (ref 65–99)
GLUCOSE-CAPILLARY: 327 mg/dL — AB (ref 65–99)
Glucose-Capillary: 178 mg/dL — ABNORMAL HIGH (ref 65–99)

## 2017-07-05 LAB — SURGICAL PCR SCREEN
MRSA, PCR: NEGATIVE
Staphylococcus aureus: NEGATIVE

## 2017-07-05 LAB — CBC
HEMATOCRIT: 36.9 % — AB (ref 39.0–52.0)
Hemoglobin: 12.2 g/dL — ABNORMAL LOW (ref 13.0–17.0)
MCH: 27.2 pg (ref 26.0–34.0)
MCHC: 33.1 g/dL (ref 30.0–36.0)
MCV: 82.4 fL (ref 78.0–100.0)
PLATELETS: 186 10*3/uL (ref 150–400)
RBC: 4.48 MIL/uL (ref 4.22–5.81)
RDW: 12.4 % (ref 11.5–15.5)
WBC: 9.4 10*3/uL (ref 4.0–10.5)

## 2017-07-05 LAB — BASIC METABOLIC PANEL
ANION GAP: 10 (ref 5–15)
BUN: 7 mg/dL (ref 6–20)
CALCIUM: 9.1 mg/dL (ref 8.9–10.3)
CO2: 29 mmol/L (ref 22–32)
CREATININE: 0.82 mg/dL (ref 0.61–1.24)
Chloride: 95 mmol/L — ABNORMAL LOW (ref 101–111)
Glucose, Bld: 195 mg/dL — ABNORMAL HIGH (ref 65–99)
Potassium: 3.7 mmol/L (ref 3.5–5.1)
SODIUM: 134 mmol/L — AB (ref 135–145)

## 2017-07-05 MED ORDER — INSULIN GLARGINE 100 UNIT/ML ~~LOC~~ SOLN
20.0000 [IU] | Freq: Every day | SUBCUTANEOUS | Status: DC
  Administered 2017-07-05: 20 [IU] via SUBCUTANEOUS
  Filled 2017-07-05 (×2): qty 0.2

## 2017-07-05 MED ORDER — CEFAZOLIN SODIUM-DEXTROSE 2-4 GM/100ML-% IV SOLN
2.0000 g | INTRAVENOUS | Status: AC
  Administered 2017-07-06: 2 g via INTRAVENOUS
  Filled 2017-07-05 (×3): qty 100

## 2017-07-05 MED ORDER — ENOXAPARIN SODIUM 40 MG/0.4ML ~~LOC~~ SOLN
40.0000 mg | SUBCUTANEOUS | Status: DC
  Administered 2017-07-06 – 2017-07-09 (×4): 40 mg via SUBCUTANEOUS
  Filled 2017-07-05 (×4): qty 0.4

## 2017-07-05 MED ORDER — CHLORHEXIDINE GLUCONATE 4 % EX LIQD
60.0000 mL | Freq: Once | CUTANEOUS | Status: AC
  Administered 2017-07-06: 4 via TOPICAL

## 2017-07-05 MED ORDER — ENSURE ENLIVE PO LIQD
237.0000 mL | Freq: Two times a day (BID) | ORAL | Status: DC
  Administered 2017-07-05 – 2017-07-10 (×8): 237 mL via ORAL

## 2017-07-05 NOTE — Progress Notes (Signed)
Patient ID: Spencer Robles, male   DOB: 07-Dec-1960, 56 y.o.   MRN: [id number] Patient is seen in follow-up for purulent abscess right foot.  Patient's hemoglobin A1c is 14.9.  Patient's MRI scan is reviewed which shows a large abscess which encompasses both the great toe and second toe.  There is some edema changes in the bone and with the abscess directly touching the bone this most likely is osteomyelitis.  I had a long discussion  regarding the importance of removing the necrotic tissue, gangrene, removing the abscess and removing the infected bone and tendon.  Recommended proceeding with a great toe and second toe amputation.  This may be more extensive if there is more proximal extension of the abscess.  Discussed with the patient that surgery is for life and limb salvage.  Discussed that with waiting patient is at increased risk of further infection in the foot and increased risk of mortality.  Patient states he understands but states he cannot make a decision at this time.  I have requested the patient be n.p.o. after midnight and that we could talk in the morning.  Discussed that waiting 2 weeks as per the patient's request is [place].

## 2017-07-05 NOTE — Progress Notes (Signed)
Patient refusing surgery at the moment and has not been NPO throughout the night as a result. Patient states he would like to attempt natural methods of treatment instead. Orthopedic Surgeon, Dr. Sharol Given is aware. Nursing will continue to update as needed.

## 2017-07-05 NOTE — Progress Notes (Addendum)
PROGRESS NOTE   Spencer Robles  YWV:[id number]    DOB: [birth year]    DOA: 07/03/2017  PCP: Dorena Dew, FNP   I have briefly reviewed patients previous medical [facility] in [facility].  Brief Narrative:  56 year old male with PMH of type II DM diagnosed sometime in 2007 in the setting of DKA, hospitalized and discharged on insulin but has been off of insulin's and metformin, presented with diabetic right foot infection. Orthopedics consulted. MRI foot without osteomyelitis but shows abscess. Patient finally agreeable to having surgery on 11/14.   Assessment & Plan:   Active Problems:   Diabetic foot ([place])   Diabetic foot infection ([place])   Subacute osteomyelitis, right ankle and foot ([place])   1. Diabetic right foot infection/abscess: Empirically started on IV vancomycin, cefepime and metronidazole. Orthopedic consultation and follow-up appreciated. Discussed with Dr. Sharol Given. MRI shows a large abscess around the first and second toe and possible osteomyelitis (per Dr. Sharol Given). Patient initially declined surgery but finally has agreed for surgery (I&D, great toe and second toe amputation) on 11/14. Continue current antibiotics. 2. Uncontrolled DM 2 with peripheral neuropathy: In ED blood sugar was 529. A1c 14.9 suggests very poor control. Started Lantus 15 units daily and NovoLog SSI discussed in detail with patient that he will most likely have to be on insulin's and metformin at discharge and counseled extensively regarding importance of compliance with all aspects of medical care. He verbalized understanding. DM coordinator input appreciated. Hyperglycemia also being driven by acute infection. Increased Lantus to 20 units daily. Insulin's may need further titration. 3. Pseudohyponatremia: Secondary to hyperglycemia. Resolved. 4. Anemia: Stable. 5. Medical noncompliance: Counseled. 6. Essential hypertension: Controlled off of medications.   DVT prophylaxis: Lovenox Code Status:  Full Family Communication: None at bedside Disposition: DC home when medically improved   Consultants:  Orthopedics   Procedures:  None  Antimicrobials:  IV vancomycin, cefepime and metronidazole    Subjective: Not much pain in right foot. Still having some drainage. Long discussion regarding risks of not having surgery including worsening infection, sepsis and even death. He is finally agreeable for surgery.  ROS: No dizziness, lightheadedness, chest pain, dyspnea, fever or chills reported.   Objective:  Vitals:   07/04/17 1129 07/04/17 1409 07/04/17 2201 07/05/17 0710  BP:  120/71 126/80 (!) 153/75  Pulse:  75 (!) 59 94  Resp:  18 16 18   Temp:  98.2 F (36.8 C) 99 F (37.2 C) 98.6 F (37 C)  TempSrc:  Oral Oral Oral  SpO2:  98% 99% 99%  Weight:      Height: 5\' 11"  (1.803 m)       Examination:  General exam: Middle-aged male lying comfortably supine in bed. Does not appear in distress. Respiratory system: Clear to auscultation. Respiratory effort normal. Stable without change. Cardiovascular system: S1 & S2 heard, RRR. No JVD, murmurs, rubs, gallops or clicks. No pedal edema. Stable without change. Telemetry: Sinus rhythm. Gastrointestinal system: Abdomen is nondistended, soft and nontender. No organomegaly or masses felt. Normal bowel sounds heard. Stable Central nervous system: Alert and oriented. No focal neurological deficits. Stable Extremities: Symmetric 5 x 5 power. Skin: Right foot dressing slightly soiled with minimal blood and drainage. Psychiatry: Judgement and insight appear normal. Mood & affect appropriate.     Data Reviewed: I have personally reviewed following labs and imaging studies  CBC: Recent Labs  Lab 07/03/17 1419 07/03/17 2033 07/04/17 0517 07/05/17 0553  WBC 13.4* 10.8* 10.4 9.4  NEUTROABS 10.4*  --  6.5  --   HGB 13.4 12.7* 11.9* 12.2*  HCT 38.9* 37.6* 35.4* 36.9*  MCV 81.0 82.1 81.4 82.4  PLT 207 163 177 245   Basic  Metabolic Panel: Recent Labs  Lab 07/03/17 1419 07/03/17 2033 07/04/17 0517 07/05/17 0553  NA 129*  --  135 134*  K 4.0  --  3.8 3.7  CL 91*  --  98* 95*  CO2 24  --  27 29  GLUCOSE 528*  --  171* 195*  BUN 7  --  6 7  CREATININE 0.92 0.81 0.72 0.82  CALCIUM 9.3  --  8.9 9.1   Liver Function Tests: Recent Labs  Lab 07/03/17 1419  AST 26  ALT 24  ALKPHOS 114  BILITOT 0.4  PROT 7.2  ALBUMIN 3.1*   HbA1C: Recent Labs    07/04/17 0517  HGBA1C 14.9*   CBG: Recent Labs  Lab 07/04/17 1349 07/04/17 1707 07/04/17 2207 07/05/17 0717 07/05/17 1204  GLUCAP 357* 170* 235* 198* 280*    Recent Results (from the past 240 hour(s))  Culture, blood (Routine x 2)     Status: None (Preliminary result)   Collection Time: 07/03/17  5:34 PM  Result Value Ref Range Status   Specimen Description BLOOD RIGHT FOREARM  Final   Special Requests   Final    BOTTLES DRAWN AEROBIC AND ANAEROBIC Blood Culture adequate volume   Culture NO GROWTH < 24 HOURS  Final   Report Status PENDING  Incomplete  Culture, blood (Routine x 2)     Status: None (Preliminary result)   Collection Time: 07/03/17  5:44 PM  Result Value Ref Range Status   Specimen Description BLOOD RIGHT FOREARM DISTAL  Final   Special Requests   Final    BOTTLES DRAWN AEROBIC AND ANAEROBIC Blood Culture adequate volume   Culture NO GROWTH < 24 HOURS  Final   Report Status PENDING  Incomplete         Radiology Studies: Dg Chest 2 View  Result Date: 07/03/2017 CLINICAL DATA:  History of fluid buildup in the lower right leg, some pain in the right foot EXAM: CHEST  2 VIEW COMPARISON:  None. FINDINGS: No active infiltrate or effusion is seen. Mediastinal and hilar contours are unremarkable. The heart is mildly enlarged. There are degenerative changes throughout the thoracic spine. IMPRESSION: Mild cardiomegaly.  No active lung disease. Electronically Signed   By: Ivar Drape M.D.   On: 07/03/2017 16:12   Mr Foot Right Wo  Contrast  Result Date: 07/04/2017 CLINICAL DATA:  Diabetic insensate neuropathy who states he has had over a 2-week history of ulceration and drainage beneath the great toe first metatarsal. EXAM: MRI OF THE RIGHT FOREFOOT WITHOUT CONTRAST TECHNIQUE: Multiplanar, multisequence MR imaging of the right forefoot was performed. No intravenous contrast was administered. COMPARISON:  None. FINDINGS: Bones/Joint/Cartilage Soft tissue ulcer along the plantar aspect of the first metatarsal head. No cortical destruction. Mild low level bone marrow edema in the first proximal phalanx likely reactive. 3.9 x 1 cm complex fluid collection in the subcutaneous fat along the medial aspect of the first proximal phalanx extending proximally into the webspace most concerning for abscess with a few low signal foci within the fluid likely reflecting air. No fracture or dislocation. Normal alignment. No joint effusion. Mild osteoarthritis of the first MTP joint. Mild osteoarthritis of the fourth TMT joint. Ligaments Collateral ligaments are intact.  Intact Lisfranc joint. Muscles and Tendons Flexor, peroneal and extensor compartment tendons  are intact. Mild T2 hyperintense signal within the plantar musculature likely neurogenic. Soft tissue No fluid collection or hematoma.  No soft tissue mass. IMPRESSION: 1. Soft tissue ulcer along the plantar aspect of the first metatarsal head. Cellulitis around the first proximal phalanx with a 3.9 x 1 cm complex fluid collection in the subcutaneous fat along the medial aspect of the first proximal phalanx extending into the webspace concerning for abscess. Small amount air within the soft tissues concerning for necrotizing infection. No evidence of osteomyelitis. Electronically Signed   By: Kathreen Devoid   On: 07/04/2017 11:16   Dg Foot Complete Right  Result Date: 07/03/2017 CLINICAL DATA:  Draining wound on the plantar surface of the right great toe for 2 weeks. Diabetic patient. EXAM: RIGHT  FOOT COMPLETE - 3+ VIEW COMPARISON:  None. FINDINGS: Soft tissues of the great and second toe are markedly swollen. Gas is seen in the soft tissues of the great toe along the lateral margin of the proximal phalanx. No bony destructive change or periosteal reaction. No acute fracture or dislocation. Remote healed fracture of the proximal phalanx of the second toe is noted. Negative foreign body. IMPRESSION: Soft tissue swelling of the first and second toes with gas in the soft tissues of the first toe consistent with infection. No plain film evidence of osteomyelitis. Electronically Signed   By: Inge Rise M.D.   On: 07/03/2017 15:02        Scheduled Meds: . enoxaparin (LOVENOX) injection  40 mg Subcutaneous Q24H  . feeding supplement (PRO-STAT SUGAR FREE 64)  30 mL Oral TID WC  . insulin aspart  0-15 Units Subcutaneous TID WC  . insulin aspart  0-5 Units Subcutaneous QHS  . insulin glargine  20 Units Subcutaneous Daily   Continuous Infusions: . ceFEPime (MAXIPIME) IV Stopped (07/05/17 0948)  . metronidazole 500 mg (07/05/17 1321)  . vancomycin Stopped (07/05/17 0813)     LOS: 2 days     Vernell Leep, MD, FACP, [facility]. [facility] Pager [phone]  If 7PM-7AM, please contact night-coverage [email or web address] Password [email or web address] 07/05/2017, 1:36 PM

## 2017-07-05 NOTE — H&P (View-Only) (Signed)
Patient ID: Spencer Robles, male   DOB: [birth year], 56 y.o.   MRN: [id number] Patient is seen in follow-up for purulent abscess right foot.  Patient's hemoglobin A1c is 14.9.  Patient's MRI scan is reviewed which shows a large abscess which encompasses both the great toe and second toe.  There is some edema changes in the bone and with the abscess directly touching the bone this most likely is osteomyelitis.  I had a long discussion  regarding the importance of removing the necrotic tissue, gangrene, removing the abscess and removing the infected bone and tendon.  Recommended proceeding with a great toe and second toe amputation.  This may be more extensive if there is more proximal extension of the abscess.  Discussed with the patient that surgery is for life and limb salvage.  Discussed that with waiting patient is at increased risk of further infection in the foot and increased risk of mortality.  Patient states he understands but states he cannot make a decision at this time.  I have requested the patient be n.p.o. after midnight and that we could talk in the morning.  Discussed that waiting 2 weeks as per the patient's request is [place].

## 2017-07-05 NOTE — Progress Notes (Signed)
Inpatient Diabetes Program Recommendations  AACE/ADA: New Consensus Statement on Inpatient Glycemic Control (2015)  Target Ranges:  Prepandial:   less than 140 mg/dL      Peak postprandial:   less than 180 mg/dL (1-2 hours)      Critically ill patients:  140 - 180 mg/dL   Lab Results  Component Value Date   GLUCAP 198 (H) 07/05/2017   HGBA1C 14.9 (H) 07/04/2017    Review of Glycemic Control Results for KIRE, FERG (MRN [id number]) as of 07/05/2017 10:20  Ref. Range 07/04/2017 04:37 07/04/2017 13:49 07/04/2017 17:07 07/04/2017 22:07 07/05/2017 07:17  Glucose-Capillary Latest Ref Range: 65 - 99 mg/dL 166 (H) 357 (H) 170 (H) 235 (H) 198 (H)    Inpatient Diabetes Program Recommendations:   -Novolog 5 units tid meal coverage if eats 50%  Thank you, Bethena Roys E. Arvine Clayburn, RN, MSN, CDE  Diabetes Coordinator Inpatient Glycemic Control Team Team Pager [phone] (8am-5pm) 07/05/2017 10:26 AM

## 2017-07-05 NOTE — Plan of Care (Signed)
  Activity: Risk for activity intolerance will decrease 07/05/2017 1118 - Progressing by Williams Che, RN   Nutrition: Adequate nutrition will be maintained 07/05/2017 1118 - Progressing by Williams Che, RN   Coping: Level of anxiety will decrease 07/05/2017 1118 - Progressing by Williams Che, RN   Pain Managment: General experience of comfort will improve 07/05/2017 1118 - Progressing by Williams Che, RN

## 2017-07-06 ENCOUNTER — Encounter (HOSPITAL_COMMUNITY)
Admission: EM | Disposition: A | Discharge status: Skilled Nursing Facility | Payer: Self-pay | Source: Home / Self Care | Attending: Internal Medicine

## 2017-07-06 ENCOUNTER — Inpatient Hospital Stay (HOSPITAL_COMMUNITY): Payer: Self-pay | Admitting: Certified Registered Nurse Anesthetist

## 2017-07-06 ENCOUNTER — Encounter (HOSPITAL_COMMUNITY): Payer: Self-pay | Admitting: *Deleted

## 2017-07-06 HISTORY — PX: AMPUTATION TOE: SHX6595

## 2017-07-06 LAB — GLUCOSE, CAPILLARY
GLUCOSE-CAPILLARY: 182 mg/dL — AB (ref 65–99)
GLUCOSE-CAPILLARY: 248 mg/dL — AB (ref 65–99)
GLUCOSE-CAPILLARY: 388 mg/dL — AB (ref 65–99)
GLUCOSE-CAPILLARY: 269 mg/dL — AB (ref 65–99)
Glucose-Capillary: 190 mg/dL — ABNORMAL HIGH (ref 65–99)
Glucose-Capillary: 209 mg/dL — ABNORMAL HIGH (ref 65–99)

## 2017-07-06 SURGERY — AMPUTATION, TOE
Anesthesia: General | Laterality: Right

## 2017-07-06 MED ORDER — FENTANYL CITRATE (PF) 250 MCG/5ML IJ SOLN
INTRAMUSCULAR | Status: AC
  Filled 2017-07-06: qty 5

## 2017-07-06 MED ORDER — PROMETHAZINE HCL 25 MG/ML IJ SOLN: 6.2500 mg | INTRAMUSCULAR | Status: DC | PRN

## 2017-07-06 MED ORDER — SODIUM CHLORIDE 0.9 % IV SOLN
INTRAVENOUS | Status: DC
  Administered 2017-07-06: 13:00:00 via INTRAVENOUS

## 2017-07-06 MED ORDER — ONDANSETRON HCL 4 MG/2ML IJ SOLN
INTRAMUSCULAR | Status: DC | PRN
  Administered 2017-07-06: 4 mg via INTRAVENOUS

## 2017-07-06 MED ORDER — DEXAMETHASONE SODIUM PHOSPHATE 10 MG/ML IJ SOLN
INTRAMUSCULAR | Status: DC | PRN
  Administered 2017-07-06: 5 mg via INTRAVENOUS

## 2017-07-06 MED ORDER — LIDOCAINE 2% (20 MG/ML) 5 ML SYRINGE
INTRAMUSCULAR | Status: AC
  Filled 2017-07-06: qty 5

## 2017-07-06 MED ORDER — KETOROLAC TROMETHAMINE 30 MG/ML IJ SOLN
INTRAMUSCULAR | Status: AC
  Filled 2017-07-06: qty 1

## 2017-07-06 MED ORDER — DOCUSATE SODIUM 100 MG PO CAPS
100.0000 mg | ORAL_CAPSULE | Freq: Two times a day (BID) | ORAL | Status: DC
  Administered 2017-07-06 – 2017-07-09 (×8): 100 mg via ORAL
  Filled 2017-07-06 (×8): qty 1

## 2017-07-06 MED ORDER — MIDAZOLAM HCL 2 MG/2ML IJ SOLN
2.0000 mg | Freq: Once | INTRAMUSCULAR | Status: AC
  Administered 2017-07-06: 2 mg via INTRAVENOUS

## 2017-07-06 MED ORDER — BISACODYL 10 MG RE SUPP: 10.0000 mg | Freq: Every day | RECTAL | Status: DC | PRN

## 2017-07-06 MED ORDER — ACETAMINOPHEN 325 MG PO TABS: 650.0000 mg | ORAL_TABLET | ORAL | Status: DC | PRN

## 2017-07-06 MED ORDER — METOCLOPRAMIDE HCL 5 MG/ML IJ SOLN: 5.0000 mg | Freq: Three times a day (TID) | INTRAMUSCULAR | Status: DC | PRN

## 2017-07-06 MED ORDER — ONDANSETRON HCL 4 MG PO TABS: 4.0000 mg | ORAL_TABLET | Freq: Four times a day (QID) | ORAL | Status: DC | PRN

## 2017-07-06 MED ORDER — FENTANYL CITRATE (PF) 100 MCG/2ML IJ SOLN
25.0000 ug | INTRAMUSCULAR | Status: DC | PRN
  Administered 2017-07-06 (×2): 50 ug via INTRAVENOUS

## 2017-07-06 MED ORDER — MEPERIDINE HCL 25 MG/ML IJ SOLN: 6.2500 mg | INTRAMUSCULAR | Status: DC | PRN

## 2017-07-06 MED ORDER — ACETAMINOPHEN 325 MG PO TABS: 325.0000 mg | ORAL_TABLET | ORAL | Status: DC | PRN

## 2017-07-06 MED ORDER — LIDOCAINE 2% (20 MG/ML) 5 ML SYRINGE
INTRAMUSCULAR | Status: DC | PRN
  Administered 2017-07-06: 80 mg via INTRAVENOUS

## 2017-07-06 MED ORDER — ROPIVACAINE HCL 5 MG/ML IJ SOLN
INTRAMUSCULAR | Status: DC | PRN
  Administered 2017-07-06 (×4): 5 mL via PERINEURAL

## 2017-07-06 MED ORDER — METOCLOPRAMIDE HCL 5 MG PO TABS: 5.0000 mg | ORAL_TABLET | Freq: Three times a day (TID) | ORAL | Status: DC | PRN

## 2017-07-06 MED ORDER — MIDAZOLAM HCL 2 MG/2ML IJ SOLN
INTRAMUSCULAR | Status: AC
  Administered 2017-07-06: 2 mg via INTRAVENOUS
  Filled 2017-07-06: qty 2

## 2017-07-06 MED ORDER — FENTANYL CITRATE (PF) 100 MCG/2ML IJ SOLN
100.0000 ug | Freq: Once | INTRAMUSCULAR | Status: AC
  Administered 2017-07-06: 100 ug via INTRAVENOUS

## 2017-07-06 MED ORDER — ACETAMINOPHEN 160 MG/5ML PO SOLN: 325.0000 mg | ORAL | Status: DC | PRN

## 2017-07-06 MED ORDER — KETOROLAC TROMETHAMINE 30 MG/ML IJ SOLN
30.0000 mg | Freq: Once | INTRAMUSCULAR | Status: DC | PRN
  Administered 2017-07-06: 30 mg via INTRAVENOUS

## 2017-07-06 MED ORDER — MIDAZOLAM HCL 2 MG/2ML IJ SOLN
INTRAMUSCULAR | Status: AC
  Filled 2017-07-06: qty 2

## 2017-07-06 MED ORDER — 0.9 % SODIUM CHLORIDE (POUR BTL) OPTIME
TOPICAL | Status: DC | PRN
  Administered 2017-07-06: 1000 mL

## 2017-07-06 MED ORDER — MAGNESIUM CITRATE PO SOLN: 1.0000 | Freq: Once | ORAL | Status: DC | PRN

## 2017-07-06 MED ORDER — METHOCARBAMOL 1000 MG/10ML IJ SOLN: 500.0000 mg | Freq: Four times a day (QID) | INTRAVENOUS | Status: DC | PRN

## 2017-07-06 MED ORDER — POLYETHYLENE GLYCOL 3350 17 G PO PACK: 17.0000 g | PACK | Freq: Every day | ORAL | Status: DC | PRN

## 2017-07-06 MED ORDER — FENTANYL CITRATE (PF) 100 MCG/2ML IJ SOLN
INTRAMUSCULAR | Status: AC
  Filled 2017-07-06: qty 2

## 2017-07-06 MED ORDER — FENTANYL CITRATE (PF) 100 MCG/2ML IJ SOLN
INTRAMUSCULAR | Status: AC
  Administered 2017-07-06: 100 ug via INTRAVENOUS
  Filled 2017-07-06: qty 2

## 2017-07-06 MED ORDER — PROPOFOL 10 MG/ML IV BOLUS
INTRAVENOUS | Status: AC
  Filled 2017-07-06: qty 20

## 2017-07-06 MED ORDER — FENTANYL CITRATE (PF) 250 MCG/5ML IJ SOLN
INTRAMUSCULAR | Status: DC | PRN
  Administered 2017-07-06: 50 ug via INTRAVENOUS

## 2017-07-06 MED ORDER — HYDROMORPHONE HCL 1 MG/ML IJ SOLN: 1.0000 mg | INTRAMUSCULAR | Status: DC | PRN

## 2017-07-06 MED ORDER — OXYCODONE HCL 5 MG/5ML PO SOLN: 5.0000 mg | Freq: Once | ORAL | Status: DC | PRN

## 2017-07-06 MED ORDER — ONDANSETRON HCL 4 MG/2ML IJ SOLN
INTRAMUSCULAR | Status: AC
  Filled 2017-07-06: qty 2

## 2017-07-06 MED ORDER — LACTATED RINGERS IV SOLN
INTRAVENOUS | Status: DC
  Administered 2017-07-06: 11:00:00 via INTRAVENOUS

## 2017-07-06 MED ORDER — OXYCODONE HCL 5 MG PO TABS: 5.0000 mg | ORAL_TABLET | Freq: Once | ORAL | Status: DC | PRN

## 2017-07-06 MED ORDER — PROPOFOL 10 MG/ML IV BOLUS
INTRAVENOUS | Status: DC | PRN
  Administered 2017-07-06: 180 mg via INTRAVENOUS

## 2017-07-06 MED ORDER — INSULIN GLARGINE 100 UNIT/ML ~~LOC~~ SOLN
20.0000 [IU] | Freq: Every day | SUBCUTANEOUS | Status: DC
  Administered 2017-07-06: 20 [IU] via SUBCUTANEOUS
  Filled 2017-07-06: qty 0.2

## 2017-07-06 MED ORDER — DEXAMETHASONE SODIUM PHOSPHATE 10 MG/ML IJ SOLN
INTRAMUSCULAR | Status: AC
  Filled 2017-07-06: qty 1

## 2017-07-06 MED ORDER — OXYCODONE HCL 5 MG PO TABS
10.0000 mg | ORAL_TABLET | ORAL | Status: DC | PRN
  Administered 2017-07-07 – 2017-07-10 (×8): 10 mg via ORAL
  Filled 2017-07-06 (×8): qty 2

## 2017-07-06 MED ORDER — ONDANSETRON HCL 4 MG/2ML IJ SOLN: 4.0000 mg | Freq: Four times a day (QID) | INTRAMUSCULAR | Status: DC | PRN

## 2017-07-06 MED ORDER — OXYCODONE HCL 5 MG PO TABS
5.0000 mg | ORAL_TABLET | ORAL | Status: DC | PRN
  Administered 2017-07-07 – 2017-07-08 (×5): 5 mg via ORAL
  Filled 2017-07-06 (×5): qty 1

## 2017-07-06 MED ORDER — ACETAMINOPHEN 650 MG RE SUPP: 650.0000 mg | RECTAL | Status: DC | PRN

## 2017-07-06 MED ORDER — METHOCARBAMOL 500 MG PO TABS
500.0000 mg | ORAL_TABLET | Freq: Four times a day (QID) | ORAL | Status: DC | PRN
  Administered 2017-07-09 (×2): 500 mg via ORAL
  Filled 2017-07-06 (×2): qty 1

## 2017-07-06 SURGICAL SUPPLY — 19 items
BLADE AVERAGE 25X9 (BLADE) ×2 IMPLANT
BNDG COHESIVE 6X5 TAN STRL LF (GAUZE/BANDAGES/DRESSINGS) ×2 IMPLANT
BNDG GAUZE ELAST 4 BULKY (GAUZE/BANDAGES/DRESSINGS) ×2 IMPLANT
DRAPE INCISE IOBAN 66X45 STRL (DRAPES) ×2 IMPLANT
DRSG ADAPTIC 3X8 NADH LF (GAUZE/BANDAGES/DRESSINGS) ×2 IMPLANT
GAUZE SPONGE 4X4 12PLY STRL (GAUZE/BANDAGES/DRESSINGS) ×2 IMPLANT
GLOVE BIOGEL PI IND STRL 9 (GLOVE) ×1 IMPLANT
GLOVE BIOGEL PI INDICATOR 9 (GLOVE) ×1
GLOVE INDICATOR 7.5 STRL GRN (GLOVE) ×2 IMPLANT
GLOVE ORTHOPEDIC STR SZ6.5 (GLOVE) ×2 IMPLANT
GLOVE SURG ORTHO 9.0 STRL STRW (GLOVE) ×2 IMPLANT
GOWN STRL REUS W/ TWL XL LVL3 (GOWN DISPOSABLE) ×2 IMPLANT
GOWN STRL REUS W/TWL XL LVL3 (GOWN DISPOSABLE) ×2
KIT DRSG PREVENA PLUS 7DAY 125 (MISCELLANEOUS) ×2 IMPLANT
KIT PREVENA INCISION MGT 13 (CANNISTER) ×2 IMPLANT
NS IRRIG 1000ML POUR BTL (IV SOLUTION) ×2 IMPLANT
PACK ORTHO EXTREMITY (CUSTOM PROCEDURE TRAY) ×2 IMPLANT
SUT ETHILON 2 0 PSLX (SUTURE) ×4 IMPLANT
SWAB CULTURE LIQUID MINI MALE (MISCELLANEOUS) ×4 IMPLANT

## 2017-07-06 NOTE — Evaluation (Signed)
Physical Therapy Evaluation Patient Details Name: Spencer Robles MRN: [id number] DOB: Nov 12, 1960 Today's Date: 07/06/2017   History of Present Illness  Pt is a 56 y.o. 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 Pertinent PMH includes DM, sleep apnea.    Clinical Impression  Pt presents with an overall decrease in functional mobility secondary to above. PTA, pt indep and lives alone. Educ on precautions, positioning, and importance of mobility. Today, pt able to transfer and amb with RW and supervision. Not interested in crutch training, prefers RW for home use. Feel that pt will progress quickly with mobility. Pt's concern is that he will not have assist at d/c if needed; attempting to find available family/friends. Pt would benefit from continued acute PT services to maximize functional mobility and independence prior to d/c home.     Follow Up Recommendations No PT follow up;Supervision - Intermittent    Equipment Recommendations  Rolling walker with 5" wheels    Recommendations for Other Services       Precautions / Restrictions Precautions Precautions: Fall Precaution Comments: Portable wound vac Restrictions Weight Bearing Restrictions: Yes RLE Weight Bearing: Non weight bearing      Mobility  Bed Mobility Overal bed mobility: Independent                Transfers Overall transfer level: Needs assistance Equipment used: Rolling walker (2 wheeled) Transfers: Sit to/from Stand Sit to Stand: Supervision         General transfer comment: Cues for hand placement on RW. Pt with good ability to maintain RLE NWB  Ambulation/Gait Ambulation/Gait assistance: Supervision Ambulation Distance (Feet): 40 Feet Assistive device: Rolling walker (2 wheeled)   Gait velocity: Decreased Gait velocity interpretation: <1.8 ft/sec, indicative of risk for recurrent falls General Gait Details: Amb with RW  and hop-to pattern, good ability to offload with BUEs. Supervision for safety. Good technique maintaining RLE NWB. Pt not interested in crutch training, prefers RW  Financial trader [employer] (Stroke Patients Only)       Balance Overall balance assessment: Needs assistance   Sitting balance-Leahy Scale: Good       Standing balance-Leahy Scale: Poor                               Pertinent Vitals/Pain Pain Assessment: Faces Faces Pain Scale: No hurt Pain Intervention(s): Monitored during session    Home Living Family/patient expects to be discharged to:: Private residence Living Arrangements: Alone Available Help at Discharge: Family;Friend(s);Available PRN/intermittently Type of Home: House Home Access: Level entry     Home Layout: One level Home Equipment: None Additional Comments: Pt will attempt to have someone stay with him the first few days upon return home if possible    Prior Function Level of Independence: Independent         Comments: Works loading trucks and as Restaurant manager, fast food        Extremity/Trunk Assessment   Upper Extremity Assessment Upper Extremity Assessment: Overall WFL for tasks assessed    Lower Extremity Assessment Lower Extremity Assessment: RLE deficits/detail RLE Deficits / Details: s/p R toes amputation    Cervical / Trunk Assessment Cervical / Trunk Assessment: Normal  Communication   Communication: No difficulties  Cognition Arousal/Alertness: Awake/alert Behavior During Therapy:  WFL for tasks assessed/performed Overall Cognitive Status: No family/caregiver present to determine baseline cognitive functioning Area of Impairment: Attention                   Current Attention Level: Selective           General Comments: Pt with decreased attention, easily distracted requiring intermittent cues to stay on task (seems like this may be his baselien)       General Comments General comments (skin integrity, edema, etc.): VSS    Exercises     Assessment/Plan    PT Assessment Patient needs continued PT services  PT Problem List Decreased balance;Decreased mobility;Decreased knowledge of use of DME;Decreased knowledge of precautions       PT Treatment Interventions DME instruction;Gait training;Stair training;Functional mobility training;Therapeutic exercise;Balance training;Therapeutic activities;Patient/family education    PT Goals (Current goals can be found in the Care Plan section)  Acute Rehab PT Goals Patient Stated Goal: Return home with some help PT Goal Formulation: With patient Time For Goal Achievement: 07/20/17 Potential to Achieve Goals: Good    Frequency Min 3X/week   Barriers to discharge Decreased caregiver support      Co-evaluation               AM-PAC PT "6 Clicks" Daily Activity  Outcome Measure Difficulty turning over in bed (including adjusting bedclothes, sheets and blankets)?: None Difficulty moving from lying on back to sitting on the side of the bed? : None Difficulty sitting down on and standing up from a chair with arms (e.g., wheelchair, bedside commode, etc,.)?: A Little Help needed moving to and from a bed to chair (including a wheelchair)?: A Little Help needed walking in hospital room?: A Little Help needed climbing 3-5 steps with a railing? : A Little 6 Click Score: 20    End of Session Equipment Utilized During Treatment: Gait belt Activity Tolerance: Patient tolerated treatment well Patient left: in chair;with call bell/phone within reach;with nursing/sitter in room Nurse Communication: Mobility status PT Visit Diagnosis: Other abnormalities of gait and mobility (R26.89)    Time: 1550-1620 PT Time Calculation (min) (ACUTE ONLY): 30 min   Charges:   PT Evaluation $PT Eval Low Complexity: 1 Low PT Treatments $Gait Training: 8-22 mins   PT G Codes:       Mabeline Caras,  PT, DPT Acute Rehab Services  Pager: [place] 07/06/2017, 4:36 PM

## 2017-07-06 NOTE — Addendum Note (Signed)
Addendum  created 07/06/17 1244 by Lyn Hollingshead, MD   Intraprocedure Meds edited

## 2017-07-06 NOTE — Interval H&P Note (Signed)
History and Physical Interval Note:  07/06/2017 7:23 AM  [name]  has presented today for surgery, with the diagnosis of Abscess/Osteomyelitis Right 1st Metatarsal  The various methods of treatment have been discussed with the patient and family. After consideration of risks, benefits and other options for treatment, the patient has consented to  Procedure(s): RIGHT GREAT TOE AND SECOND TOE AMPUTATION (Right) as a surgical intervention .  The patient's history has been reviewed, patient examined, no change in status, stable for surgery.  I have reviewed the patient's chart and labs.  Questions were answered to the patient's satisfaction.     Newt Minion

## 2017-07-06 NOTE — Progress Notes (Signed)
PROGRESS NOTE   Spencer Robles  WNU:[id number]    DOB: 1960-09-11    DOA: 07/03/2017  PCP: Dorena Dew, FNP   I have briefly reviewed patients previous medical records in [facility].  Brief Narrative:  56 year old male with PMH of type II DM diagnosed sometime in 2007 in the setting of DKA, hospitalized and discharged on insulin but has been off of insulin's and metformin, presented with diabetic right foot infection. Orthopedics consulted. MRI foot without osteomyelitis but shows abscess. 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   Assessment & Plan:   Active Problems:   Diabetic foot ([place])   Diabetic foot infection ([place])   Subacute osteomyelitis, right ankle and foot ([place])   1. Diabetic right foot infection/abscess and osteomyelitis of right first metatarsal: Empirically started on IV vancomycin, cefepime and metronidazole. Orthopedics was consulted. Surgery was advised which patient initially declined but subsequently consented to. 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 As per Dr. Sharol Given, continue IV antibiotics for 3 days postop due to infection extending to the margins of the amputation, nonweightbearing right lower extremity, continue wound VAC at discharge for 1 week, will need crutches or walker for nonweightbearing on right lower extremity and outpatient follow-up in 1 week. 2. Uncontrolled DM 2 with peripheral neuropathy: In ED blood sugar was 529. A1c 14.9 suggests very poor control. Started Lantus 15 units daily and NovoLog SSI discussed in detail with patient that he will most likely have to be on insulin's and metformin at discharge and counseled extensively regarding importance of compliance with all aspects of medical care. He verbalized understanding. DM coordinator input appreciated. Hyperglycemia also being driven by acute infection. Increased Lantus to 20 units daily. Insulin's may need further titration. Patient  was not given this morning's dose of Lantus. Change Lantus to 20 units at bedtime including a dose now. 3. Pseudohyponatremia: Secondary to hyperglycemia. Resolved. 4. Anemia: Stable. 5. Medical noncompliance: Counseled. 6. Essential hypertension: Controlled off of medications.   DVT prophylaxis: Lovenox Code Status: Full Family Communication: None at bedside Disposition: DC home when medically improved   Consultants:  Orthopedics   Procedures:  right great toe and second toe amputation on 12/14.  Antimicrobials:  IV vancomycin, cefepime and metronidazole    Subjective: Patient seen postoperatively. Spouse at bedside. Reports appropriate postop pain in the right foot. Denies any other complaints.  ROS: No dizziness, lightheadedness, chest pain, dyspnea, fever or chills reported.   Objective:  Vitals:   07/06/17 1212 07/06/17 1219 07/06/17 1229 07/06/17 1255  BP: 126/83  122/69 131/75  Pulse: 68 64 64 67  Resp: (!) 25 14 15 17   Temp:   98.1 F (36.7 C) 98.7 F (37.1 C)  TempSrc:    Oral  SpO2: 97% 98% 97% 95%  Weight:      Height:        Examination:  General exam: Middle-aged male lying comfortably supine in bed. Does not appear in distress. Respiratory system: Clear to auscultation. Respiratory effort normal. Stable Cardiovascular system: S1 & S2 heard, RRR. No JVD, murmurs, rubs, gallops or clicks. No pedal edema. Stable Gastrointestinal system: Abdomen is nondistended, soft and nontender. No organomegaly or masses felt. Normal bowel sounds heard. Stable Central nervous system: Alert and oriented. No focal neurological deficits. Stable Extremities: Symmetric 5 x 5 power. Skin: Amputated right first and second toe with wound VAC in place now. Psychiatry: Judgement and insight appear normal. Mood & affect  appropriate.     Data Reviewed: I have personally reviewed following labs and imaging studies  CBC: Recent Labs  Lab 07/03/17 1419 07/03/17 2033  07/04/17 0517 07/05/17 0553  WBC 13.4* 10.8* 10.4 9.4  NEUTROABS 10.4*  --  6.5  --   HGB 13.4 12.7* 11.9* 12.2*  HCT 38.9* 37.6* 35.4* 36.9*  MCV 81.0 82.1 81.4 82.4  PLT 207 163 177 211   Basic Metabolic Panel: Recent Labs  Lab 07/03/17 1419 07/03/17 2033 07/04/17 0517 07/05/17 0553  NA 129*  --  135 134*  K 4.0  --  3.8 3.7  CL 91*  --  98* 95*  CO2 24  --  27 29  GLUCOSE 528*  --  171* 195*  BUN 7  --  6 7  CREATININE 0.92 0.81 0.72 0.82  CALCIUM 9.3  --  8.9 9.1   Liver Function Tests: Recent Labs  Lab 07/03/17 1419  AST 26  ALT 24  ALKPHOS 114  BILITOT 0.4  PROT 7.2  ALBUMIN 3.1*   HbA1C: Recent Labs    07/04/17 0517  HGBA1C 14.9*   CBG: Recent Labs  Lab 07/06/17 0708 07/06/17 1016 07/06/17 1159 07/06/17 1313 07/06/17 1624  GLUCAP 190* 182* 209* 248* 388*    Recent Results (from the past 240 hour(s))  Culture, blood (Routine x 2)     Status: None (Preliminary result)   Collection Time: 07/03/17  5:34 PM  Result Value Ref Range Status   Specimen Description BLOOD RIGHT FOREARM  Final   Special Requests   Final    BOTTLES DRAWN AEROBIC AND ANAEROBIC Blood Culture adequate volume   Culture NO GROWTH 3 DAYS  Final   Report Status PENDING  Incomplete  Culture, blood (Routine x 2)     Status: None (Preliminary result)   Collection Time: 07/03/17  5:44 PM  Result Value Ref Range Status   Specimen Description BLOOD RIGHT FOREARM DISTAL  Final   Special Requests   Final    BOTTLES DRAWN AEROBIC AND ANAEROBIC Blood Culture adequate volume   Culture NO GROWTH 3 DAYS  Final   Report Status PENDING  Incomplete  Surgical pcr screen     Status: None   Collection Time: 07/05/17  6:29 PM  Result Value Ref Range Status   MRSA, PCR NEGATIVE NEGATIVE Final   Staphylococcus aureus NEGATIVE NEGATIVE Final    Comment: (NOTE) The Xpert SA Assay (FDA approved for NASAL specimens in patients 25 years of age and older), is one component of a  comprehensive surveillance program. It is not intended to diagnose infection nor to guide or monitor treatment.          Radiology Studies: No results found.      Scheduled Meds: . docusate sodium  100 mg Oral BID  . enoxaparin (LOVENOX) injection  40 mg Subcutaneous Q24H  . feeding supplement (ENSURE ENLIVE)  237 mL Oral BID BM  . feeding supplement (PRO-STAT SUGAR FREE 64)  30 mL Oral TID WC  . fentaNYL      . insulin aspart  0-15 Units Subcutaneous TID WC  . insulin aspart  0-5 Units Subcutaneous QHS  . insulin glargine  20 Units Subcutaneous Daily  . ketorolac       Continuous Infusions: . sodium chloride 10 mL/hr at 07/06/17 1256  . ceFEPime (MAXIPIME) IV Stopped (07/06/17 0353)  . lactated ringers    . methocarbamol (ROBAXIN)  IV    . metronidazole Stopped (07/06/17 1356)  .  vancomycin 750 mg (07/06/17 0514)     LOS: 3 days     Vernell Leep, MD, FACP, [facility]. [facility] Pager [phone]  If 7PM-7AM, please contact night-coverage [email or web address] Password [email or web address] 07/06/2017, 5:06 PM

## 2017-07-06 NOTE — Anesthesia Preprocedure Evaluation (Addendum)
Anesthesia Evaluation  Patient identified by MRN, date of birth, ID band Patient awake    Reviewed: Allergy & Precautions, NPO status , Patient's Chart, lab work & pertinent test results  Airway Mallampati: I       Dental  (+) Teeth Intact   Pulmonary    Pulmonary exam normal breath sounds clear to auscultation       Cardiovascular negative cardio ROS Normal cardiovascular exam Rhythm:Regular Rate:Normal     Neuro/Psych negative neurological ROS  negative psych ROS   GI/Hepatic negative GI ROS, Neg liver ROS,   Endo/Other  diabetes, Type 2  Renal/GU negative Renal ROS     Musculoskeletal   Abdominal (+) + obese,   Peds  Hematology  (+) Blood dyscrasia, anemia ,   Anesthesia Other Findings   Reproductive/Obstetrics                            Anesthesia Physical Anesthesia Plan  ASA: II  Anesthesia Plan: General   Post-op Pain Management:  Regional for Post-op pain   Induction:   PONV Risk Score and Plan: 2 and Ondansetron, Treatment may vary due to age or medical condition and Midazolam  Airway Management Planned: LMA  Additional Equipment:   Intra-op Plan:   Post-operative Plan:   Informed Consent: I have reviewed the patients History and Physical, chart, labs and discussed the procedure including the risks, benefits and alternatives for the proposed anesthesia with the patient or authorized representative who has indicated his/her understanding and acceptance.     Plan Discussed with: CRNA and Surgeon  Anesthesia Plan Comments:         Anesthesia Quick Evaluation

## 2017-07-06 NOTE — Transfer of Care (Signed)
Immediate Anesthesia Transfer of Care Note  Patient: Spencer Robles  Procedure(s) Performed: RIGHT GREAT TOE AND SECOND TOE AMPUTATION (Right )  Patient Location: PACU  Anesthesia Type:General and Regional  Level of Consciousness: awake and patient cooperative  Airway & Oxygen Therapy: Patient Spontanous Breathing and Patient connected to face mask oxygen  Post-op Assessment: Report given to RN and Post -op Vital signs reviewed and stable  Post vital signs: Reviewed and stable  Last Vitals:  Vitals:   07/06/17 1057 07/06/17 1158  BP: 127/75 121/82  Pulse: 60 64  Resp: 15 10  Temp:  36.7 C  SpO2: 97% 100%    Last Pain:  Vitals:   07/06/17 1158  TempSrc:   PainSc: (P) Asleep      Patients Stated Pain Goal: 3 (73/22/02 5427)  Complications: No apparent anesthesia complications

## 2017-07-06 NOTE — Anesthesia Procedure Notes (Signed)
Procedure Name: LMA Insertion Date/Time: 07/06/2017 11:18 AM Performed by: Freddie Breech, CRNA Pre-anesthesia Checklist: Patient identified, Emergency Drugs available, Suction available and Patient being monitored Patient Re-evaluated:Patient Re-evaluated prior to induction Oxygen Delivery Method: Circle System Utilized Preoxygenation: Pre-oxygenation with 100% oxygen Induction Type: IV induction Ventilation: Mask ventilation without difficulty LMA: LMA inserted LMA Size: 5.0 Number of attempts: 1 Airway Equipment and Method: Bite block Placement Confirmation: positive ETCO2 Tube secured with: Tape Dental Injury: Teeth and Oropharynx as per pre-operative assessment

## 2017-07-06 NOTE — Anesthesia Procedure Notes (Addendum)
Anesthesia Regional Block: Popliteal block   Pre-Anesthetic Checklist: ,, timeout performed, Correct Patient, Correct Site, Correct Laterality, Correct Procedure, Correct Position, site marked, Risks and benefits discussed,  Surgical consent,  Pre-op evaluation,  At surgeon's request and post-op pain management  Laterality: Right and Lower  Prep: chloraprep       Needles:  Injection technique: Single-shot  Needle Type: Echogenic Stimulator Needle     Needle Length: 9cm  Needle Gauge: 21     Additional Needles:   Procedures:,,,, ultrasound used (permanent image in chart),,,,  Narrative:  Start time: 07/06/2017 10:40 AM End time: 07/06/2017 10:48 AM Injection made incrementally with aspirations every 5 mL.  Performed by: Personally  Anesthesiologist: Lyn Hollingshead, MD

## 2017-07-06 NOTE — Op Note (Signed)
07/06/2017  11:55 AM  PATIENT:  Spencer Robles    PRE-OPERATIVE DIAGNOSIS:  Abscess/Osteomyelitis Right 1st Metatarsal  POST-OPERATIVE DIAGNOSIS:  Same  PROCEDURE:  RIGHT GREAT TOE AND SECOND TOE AMPUTATION  SURGEON:  Newt Minion, MD  PHYSICIAN ASSISTANT:None ANESTHESIA:   General  PREOPERATIVE INDICATIONS:  Spencer Robles is a  56 y.o. male with a diagnosis of Abscess/Osteomyelitis Right 1st Metatarsal who failed conservative measures and elected for surgical management.    The risks benefits and alternatives were discussed with the patient preoperatively including but not limited to the risks of infection, bleeding, nerve injury, cardiopulmonary complications, the need for revision surgery, among others, and the patient was willing to proceed.  OPERATIVE IMPLANTS: Praveena wound VAC  OPERATIVE FINDINGS: Abscess extended into the MTP joint.  Cultures were obtained.  OPERATIVE PROCEDURE: Patient was brought to the operating room underwent a general anesthetic.  After adequate levels of anesthesia were obtained patient's right lower extremity was prepped using DuraPrep draped into a sterile field a timeout was called.  A fishmouth incision was just made distal to the MTP joint of the great toe and second toe.  There is a large abscess that extended down to the MTP joint.  This necessitated a resection of the first metatarsal through the neck and the second metatarsal through the neck to get proximal to the abscess.  The sesamoids were excised.  Electrocautery was used for hemostasis the wound was irrigated with normal saline.  Wound edges were healthy viable.  The incision was closed using 2-0 nylon.  A Praveena wound VAC was applied this had a good suction fit.  Patient was extubated taken to PACU in stable condition.   DISCHARGE PLANNING:  Antibiotic duration: Continue IV antibiotics for 3 days postoperatively due to the infection extending to the margins of the  amputation.  Weightbearing: Nonweightbearing right lower extremity.  Pain medication: As needed.  Dressing care/ Wound VAC: Continue wound VAC at discharge for 1 week.  Ambulatory devices: Patient will need crutches or walker for nonweightbearing on the right lower extremity.  Discharge to: Anticipate discharge to home.  Follow-up: In the office 1 week post operative.

## 2017-07-06 NOTE — Progress Notes (Signed)
Patient off floor to OR

## 2017-07-06 NOTE — Anesthesia Postprocedure Evaluation (Signed)
Anesthesia Post Note  Patient: Spencer Robles  Procedure(s) Performed: RIGHT GREAT TOE AND SECOND TOE AMPUTATION (Right )     Patient location during evaluation: PACU Anesthesia Type: General Level of consciousness: awake Pain management: pain level controlled Vital Signs Assessment: post-procedure vital signs reviewed and stable Respiratory status: spontaneous breathing Cardiovascular status: stable Postop Assessment: no apparent nausea or vomiting Anesthetic complications: no    Last Vitals:  Vitals:   07/06/17 1219 07/06/17 1229  BP:  122/69  Pulse: 64 64  Resp: 14 15  Temp:  36.7 C  SpO2: 98% 97%    Last Pain:  Vitals:   07/06/17 1229  TempSrc:   PainSc: 5    Pain Goal: Patients Stated Pain Goal: 3 (07/05/17 2127)  LLE Motor Response: Purposeful movement (07/06/17 1229) LLE Sensation: Full sensation (07/06/17 1229) RLE Motor Response: Purposeful movement (07/06/17 1229) RLE Sensation: Full sensation (07/06/17 1229)      [place]

## 2017-07-07 ENCOUNTER — Encounter (HOSPITAL_COMMUNITY): Payer: Self-pay | Admitting: Orthopedic Surgery

## 2017-07-07 LAB — GLUCOSE, CAPILLARY
GLUCOSE-CAPILLARY: 200 mg/dL — AB (ref 65–99)
Glucose-Capillary: 232 mg/dL — ABNORMAL HIGH (ref 65–99)
Glucose-Capillary: 331 mg/dL — ABNORMAL HIGH (ref 65–99)
Glucose-Capillary: 260 mg/dL — ABNORMAL HIGH (ref 65–99)

## 2017-07-07 LAB — CBC
HEMATOCRIT: 36.4 % — AB (ref 39.0–52.0)
HEMOGLOBIN: 12.2 g/dL — AB (ref 13.0–17.0)
MCH: 27.5 pg (ref 26.0–34.0)
MCHC: 33.5 g/dL (ref 30.0–36.0)
MCV: 82.2 fL (ref 78.0–100.0)
Platelets: 216 10*3/uL (ref 150–400)
RBC: 4.43 MIL/uL (ref 4.22–5.81)
RDW: 12.4 % (ref 11.5–15.5)
WBC: 15.7 10*3/uL — ABNORMAL HIGH (ref 4.0–10.5)

## 2017-07-07 LAB — BASIC METABOLIC PANEL
Anion gap: 8 (ref 5–15)
BUN: 10 mg/dL (ref 6–20)
CHLORIDE: 97 mmol/L — AB (ref 101–111)
CO2: 27 mmol/L (ref 22–32)
CREATININE: 0.79 mg/dL (ref 0.61–1.24)
Calcium: 8.8 mg/dL — ABNORMAL LOW (ref 8.9–10.3)
GFR calc non Af Amer: >60 mL/min (ref >60)
Glucose, Bld: 241 mg/dL — ABNORMAL HIGH (ref 65–99)
POTASSIUM: 3.7 mmol/L (ref 3.5–5.1)
Sodium: 132 mmol/L — ABNORMAL LOW (ref 135–145)

## 2017-07-07 LAB — VANCOMYCIN, TROUGH: Vancomycin Tr: 6 ug/mL — ABNORMAL LOW (ref 15–20)

## 2017-07-07 MED ORDER — INSULIN ASPART 100 UNIT/ML ~~LOC~~ SOLN
4.0000 [IU] | Freq: Three times a day (TID) | SUBCUTANEOUS | Status: DC
  Administered 2017-07-07 – 2017-07-10 (×10): 4 [IU] via SUBCUTANEOUS

## 2017-07-07 MED ORDER — INSULIN GLARGINE 100 UNIT/ML ~~LOC~~ SOLN
25.0000 [IU] | Freq: Every day | SUBCUTANEOUS | Status: DC
  Administered 2017-07-07 – 2017-07-09 (×3): 25 [IU] via SUBCUTANEOUS
  Filled 2017-07-07 (×4): qty 0.25

## 2017-07-07 MED ORDER — VANCOMYCIN HCL IN DEXTROSE 750-5 MG/150ML-% IV SOLN
750.0000 mg | Freq: Three times a day (TID) | INTRAVENOUS | Status: DC
  Administered 2017-07-07 – 2017-07-09 (×6): 750 mg via INTRAVENOUS
  Filled 2017-07-07 (×6): qty 150

## 2017-07-07 MED ORDER — VANCOMYCIN HCL 10 G IV SOLR
1250.0000 mg | Freq: Once | INTRAVENOUS | Status: AC
  Administered 2017-07-07: 1250 mg via INTRAVENOUS
  Filled 2017-07-07: qty 1250

## 2017-07-07 MED ORDER — DEXTROSE 5 % IV SOLN
2.0000 g | Freq: Three times a day (TID) | INTRAVENOUS | Status: DC
  Administered 2017-07-07 – 2017-07-09 (×7): 2 g via INTRAVENOUS
  Filled 2017-07-07 (×8): qty 2

## 2017-07-07 NOTE — Progress Notes (Signed)
PROGRESS NOTE   Spencer Robles  MVH:[id number]    DOB: [birth year]    DOA: 07/03/2017  PCP: Dorena Dew, FNP   I have briefly reviewed patients previous medical records in [facility]  Brief Narrative:  56 year old male with PMH of type II DM diagnosed sometime in 2007 in the setting of DKA, hospitalized and discharged on insulin but has been off of insulin's and metformin, presented with diabetic right foot infection. Orthopedics consulted. MRI foot without osteomyelitis but shows abscess. 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   Assessment & Plan:   Active Problems:   Diabetic foot ([place])   Diabetic foot infection ([place])   Subacute osteomyelitis, right ankle and foot ([place])   1. Diabetic right foot infection/abscess and osteomyelitis of right first metatarsal: Empirically started on IV vancomycin, cefepime and metronidazole. Orthopedics was consulted. Surgery was advised which patient initially declined but subsequently consented to. 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 As per Dr. Sharol Given, continue IV antibiotics for 3 days postop (through 12/17) due to infection extending to the margins of the amputation, nonweightbearing right lower extremity, continue wound VAC at discharge for 1 week, will need crutches or walker for nonweightbearing on right lower extremity and outpatient follow-up in 1 week. Improved. Blood cultures 2: Negative to date. Surgical wound culture: Pending. 2. Uncontrolled DM 2 with peripheral neuropathy: In ED blood sugar was 529. A1c 14.9 suggests very poor control. Started Lantus and SSI, adjusting doses as needed. Increase Lantus from 20 units to 25 units at bedtime. Continue NovoLog SSI. Add mealtime NovoLog 4 units. Patient has been extensively counseled regarding compliance with all aspects of medical care. 3. Pseudohyponatremia: Secondary to hyperglycemia. Resolved. 4. Anemia: Stable. 5. Medical  noncompliance: Counseled. 6. Essential hypertension: Controlled off of medications.   DVT prophylaxis: Lovenox Code Status: Full Family Communication: None at bedside Disposition: DC home when medically improved   Consultants:  Orthopedics   Procedures:  right great toe and second toe amputation on 12/14.  Antimicrobials:  IV vancomycin, cefepime and metronidazole    Subjective: Does not have much sensation in feet and hence not much pain reported. No other complaints.  ROS: No dizziness, lightheadedness, chest pain, dyspnea, fever or chills reported.   Objective:  Vitals:   07/06/17 2143 07/07/17 0000 07/07/17 0538 07/07/17 0851  BP: 125/77 120/72 125/75 130/71  Pulse: 66 (!) 58 73 77  Resp:    16  Temp: 98.6 F (37 C) 97.9 F (36.6 C) 98 F (36.7 C) 98.8 F (37.1 C)  TempSrc: Oral Oral Oral Oral  SpO2: 94% 97% 97% 97%  Weight:      Height:        Examination:  General exam: Middle-aged male sitting up comfortably in bed. Respiratory system: Clear to auscultation. Respiratory effort normal. Stable Cardiovascular system: S1 & S2 heard, RRR. No JVD, murmurs, rubs, gallops or clicks. No pedal edema. Stable Gastrointestinal system: Abdomen is nondistended, soft and nontender. No organomegaly or masses felt. Normal bowel sounds heard. Stable without change. Central nervous system: Alert and oriented. No focal neurological deficits. Stable Extremities: Symmetric 5 x 5 power. Skin: Amputated right first and second toe with wound VAC in place now. Stable without change. Psychiatry: Judgement and insight appear normal. Mood & affect appropriate.     Data Reviewed: I have personally reviewed following labs and imaging studies  CBC: Recent Labs  Lab 07/03/17 1419 07/03/17 2033 07/04/17 0517 07/05/17  5701 07/07/17 0524  WBC 13.4* 10.8* 10.4 9.4 15.7*  NEUTROABS 10.4*  --  6.5  --   --   HGB 13.4 12.7* 11.9* 12.2* 12.2*  HCT 38.9* 37.6* 35.4* 36.9* 36.4*    MCV 81.0 82.1 81.4 82.4 82.2  PLT 207 163 177 186 779   Basic Metabolic Panel: Recent Labs  Lab 07/03/17 1419 07/03/17 2033 07/04/17 0517 07/05/17 0553 07/07/17 0524  NA 129*  --  135 134* 132*  K 4.0  --  3.8 3.7 3.7  CL 91*  --  98* 95* 97*  CO2 24  --  27 29 27   GLUCOSE 528*  --  171* 195* 241*  BUN 7  --  6 7 10   CREATININE 0.92 0.81 0.72 0.82 0.79  CALCIUM 9.3  --  8.9 9.1 8.8*   Liver Function Tests: Recent Labs  Lab 07/03/17 1419  AST 26  ALT 24  ALKPHOS 114  BILITOT 0.4  PROT 7.2  ALBUMIN 3.1*   HbA1C: No results for input(s): HGBA1C in the last 72 hours. CBG: Recent Labs  Lab 07/06/17 1313 07/06/17 1624 07/06/17 2151 07/07/17 0730 07/07/17 1208  GLUCAP 248* 388* 269* 232* 331*    Recent Results (from the past 240 hour(s))  Culture, blood (Routine x 2)     Status: None (Preliminary result)   Collection Time: 07/03/17  5:34 PM  Result Value Ref Range Status   Specimen Description BLOOD RIGHT FOREARM  Final   Special Requests   Final    BOTTLES DRAWN AEROBIC AND ANAEROBIC Blood Culture adequate volume   Culture NO GROWTH 3 DAYS  Final   Report Status PENDING  Incomplete  Culture, blood (Routine x 2)     Status: None (Preliminary result)   Collection Time: 07/03/17  5:44 PM  Result Value Ref Range Status   Specimen Description BLOOD RIGHT FOREARM DISTAL  Final   Special Requests   Final    BOTTLES DRAWN AEROBIC AND ANAEROBIC Blood Culture adequate volume   Culture NO GROWTH 3 DAYS  Final   Report Status PENDING  Incomplete  Surgical pcr screen     Status: None   Collection Time: 07/05/17  6:29 PM  Result Value Ref Range Status   MRSA, PCR NEGATIVE NEGATIVE Final   Staphylococcus aureus NEGATIVE NEGATIVE Final    Comment: (NOTE) The Xpert SA Assay (FDA approved for NASAL specimens in patients 49 years of age and older), is one component of a comprehensive surveillance program. It is not intended to diagnose infection nor to guide or monitor  treatment.   Aerobic/Anaerobic Culture (surgical/deep wound)     Status: None (Preliminary result)   Collection Time: 07/06/17 11:37 AM  Result Value Ref Range Status   Specimen Description WOUND RIGHT FOOT  Final   Special Requests RIGHT FOOT GREAT TOE AND SECOND TOE  Final   Gram Stain   Final    ABUNDANT WBC PRESENT,BOTH PMN AND MONONUCLEAR FEW GRAM POSITIVE COCCI FEW GRAM VARIABLE ROD    Culture CULTURE REINCUBATED FOR BETTER GROWTH  Final   Report Status PENDING  Incomplete         Radiology Studies: No results found.      Scheduled Meds: . docusate sodium  100 mg Oral BID  . enoxaparin (LOVENOX) injection  40 mg Subcutaneous Q24H  . feeding supplement (ENSURE ENLIVE)  237 mL Oral BID BM  . feeding supplement (PRO-STAT SUGAR FREE 64)  30 mL Oral TID WC  .  insulin aspart  0-15 Units Subcutaneous TID WC  . insulin aspart  0-5 Units Subcutaneous QHS  . insulin glargine  20 Units Subcutaneous QHS   Continuous Infusions: . sodium chloride 10 mL/hr at 07/06/17 1256  . ceFEPime (MAXIPIME) IV Stopped (07/07/17 0835)  . lactated ringers    . methocarbamol (ROBAXIN)  IV    . metronidazole 500 mg (07/07/17 0535)  . vancomycin    . vancomycin       LOS: 4 days     Vernell Leep, MD, FACP, [facility]. [facility] Pager [phone]  If 7PM-7AM, please contact night-coverage [email or web address] Password [email or web address] 07/07/2017, 1:48 PM

## 2017-07-07 NOTE — Progress Notes (Signed)
ANTIBIOTIC CONSULT NOTE   Pharmacy Consult for Vanco/Cefepime Indication: osteo  No Known Allergies  Patient Measurements: Height: 5\' 11"  (180.3 cm) Weight: 232 lb (105.2 kg) IBW/kg (Calculated) : 75.3 Adjusted Body Weight:    Vital Signs: Temp: 98 F (36.7 C) (12/15 0538) Temp Source: Oral (12/15 0538) BP: 125/75 (12/15 0538) Pulse Rate: 73 (12/15 0538) Intake/Output from previous day: 12/14 0701 - 12/15 0700 In: 1180 [P.O.:480; I.V.:700] Out: 10 [Blood:10] Intake/Output from this shift: No intake/output data recorded.  Labs: Recent Labs    07/05/17 0553 07/07/17 0524  WBC 9.4 15.7*  HGB 12.2* 12.2*  PLT 186 216  CREATININE 0.82 0.79   Estimated Creatinine Clearance: 124.3 mL/min (by C-G formula based on SCr of 0.79 mg/dL). Recent Labs    07/07/17 0524  [facility] 6*     Microbiology:   Medical History: Past Medical History:  Diagnosis Date  . Diabetes mellitus without complication ([place])   . Sleep apnea    does not use a cpap   Assessment:   ID: Abx D5 for diabetic foot infxn -  Abscess/Osteomyelitis Right 1st Metatarsal. Afebrile. WBC 9.4>15.7. Scr WNL. All doses charted.  Vanco trough unexpectedly low 6.  - 12/14: had R great toe and 2nd toe amputation  12/11 vanc>> 12/11 cefepime>> 12/11 flagyl>>  12/15: VT 6: 1250mg  x 1, then incr 750mg  IV q 8 hr  12/11 BCx: ngtd 12/14: R foot wound >>GPC, gram variable rod  Goal of Therapy:  Vancomycin trough level 15-20 mcg/ml  Plan:  Cefepime 2gm IV increase to q 8 hrs. Flagyl 500mg  IV Q8H per MD Vancomycin 1250mg  IV x 1 with next dose this afternoon, then 750mg  IV increase to q 8 hrs.  Ebbie Sorenson S. Alford Highland, PharmD, BCPS Clinical Staff Pharmacist Pager [phone]  Spencer Robles 07/07/2017,7:21 AM

## 2017-07-08 ENCOUNTER — Encounter (HOSPITAL_COMMUNITY): Payer: Self-pay | Admitting: *Deleted

## 2017-07-08 LAB — CBC
HCT: 34.5 % — ABNORMAL LOW (ref 39.0–52.0)
Hemoglobin: 11.7 g/dL — ABNORMAL LOW (ref 13.0–17.0)
MCH: 28 pg (ref 26.0–34.0)
MCHC: 33.9 g/dL (ref 30.0–36.0)
MCV: 82.5 fL (ref 78.0–100.0)
PLATELETS: 196 10*3/uL (ref 150–400)
RBC: 4.18 MIL/uL — AB (ref 4.22–5.81)
RDW: 12.6 % (ref 11.5–15.5)
WBC: 10.9 10*3/uL — ABNORMAL HIGH (ref 4.0–10.5)

## 2017-07-08 LAB — CULTURE, BLOOD (ROUTINE X 2)
CULTURE: NO GROWTH
CULTURE: NO GROWTH
Special Requests: ADEQUATE
Special Requests: ADEQUATE

## 2017-07-08 LAB — GLUCOSE, CAPILLARY
GLUCOSE-CAPILLARY: 164 mg/dL — AB (ref 65–99)
GLUCOSE-CAPILLARY: 242 mg/dL — AB (ref 65–99)
GLUCOSE-CAPILLARY: 268 mg/dL — AB (ref 65–99)
Glucose-Capillary: 204 mg/dL — ABNORMAL HIGH (ref 65–99)
Glucose-Capillary: 281 mg/dL — ABNORMAL HIGH (ref 65–99)

## 2017-07-08 NOTE — Progress Notes (Signed)
PROGRESS NOTE   Spencer Robles  GYI:[id number]    DOB: Aug 30, 1960    DOA: 07/03/2017  PCP: Dorena Dew, FNP   I have briefly reviewed patients [facility].  Brief Narrative:  56 year old male with PMH of type II DM diagnosed sometime in 2007 in the setting of DKA, hospitalized and discharged on insulin but has been off of insulin's and metformin, presented with diabetic right foot infection. Orthopedics consulted. MRI foot without osteomyelitis but shows abscess. 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   Assessment & Plan:   Active Problems:   Diabetic foot ([place])   Diabetic foot infection ([place])   Subacute osteomyelitis, right ankle and foot ([place])   1. Diabetic right foot infection/abscess and osteomyelitis of right first metatarsal: Empirically started on IV vancomycin, cefepime and metronidazole. Orthopedics was consulted. Surgery was advised which patient initially declined but subsequently consented to. 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 As per Dr. Sharol Given, continue IV antibiotics for 3 days postop (through 12/17) due to infection extending to the margins of the amputation, nonweightbearing right lower extremity, continue wound VAC at discharge for 1 week, will need crutches or walker for nonweightbearing on right lower extremity and outpatient follow-up in 1 week. Improved. Blood cultures 2: Negative, final report. Surgical wound culture: Pending. PT evaluation. 2. Uncontrolled DM 2 with peripheral neuropathy: In ED blood sugar was 529. A1c 14.9 suggests very poor control. Started Lantus and SSI, adjusting doses as needed. Increase Lantus from 20 units to 25 units at bedtime. Continue NovoLog SSI. Add mealtime NovoLog 4 units. Patient has been extensively counseled regarding compliance with all aspects of medical care. Despite change in Lantus as above, CBGs ranging in the 200s today. Increase  Lantus to 30 units at bedtime. 3. Pseudohyponatremia: Secondary to hyperglycemia. Resolved. 4. Anemia: Stable. 5. Medical noncompliance: Counseled. 6. Essential hypertension: Controlled off of medications.   DVT prophylaxis: Lovenox Code Status: Full Family Communication: None at bedside Disposition: DC home when medically improved,? 12/17 pending PT evaluation. Patient reports some issues with his toilet or bathroom and feels that he may not be safe to return home.   Consultants:  Orthopedics   Procedures:  right great toe and second toe amputation on 12/14.  Antimicrobials:  IV vancomycin, cefepime and metronidazole    Subjective: No pain reported. Indicates some issues at home where he may not be able to appropriately maneuver in his toilet or bathroom.  ROS: No dizziness, lightheadedness, chest pain, dyspnea, fever or chills reported.   Objective:  Vitals:   07/07/17 0851 07/07/17 1441 07/07/17 2030 07/08/17 0500  BP: 130/71 137/67 129/66 121/72  Pulse: 77 72 (!) 58 (!) 57  Resp: 16 16 16 15   Temp: 98.8 F (37.1 C) 98.3 F (36.8 C) 98.7 F (37.1 C) 98.8 F (37.1 C)  TempSrc: Oral Oral Oral Oral  SpO2: 97% 97% 96% 98%  Weight:      Height:        Examination:  General exam: Middle-aged male sitting up comfortably in bed. Respiratory system: Clear to auscultation. Respiratory effort normal. Stable Cardiovascular system: S1 & S2 heard, RRR. No JVD, murmurs, rubs, gallops or clicks. No pedal edema. Stable Gastrointestinal system: Abdomen is nondistended, soft and nontender. No organomegaly or masses felt. Normal bowel sounds heard. Stable without change. Central nervous system: Alert and oriented. No focal neurological deficits. Stable Extremities: Symmetric 5 x 5 power. Skin: Amputated  right first and second toe with wound VAC in place now. No acute findings. Psychiatry: Judgement and insight appear normal. Mood & affect appropriate.     Data Reviewed: I  have personally reviewed following labs and imaging studies  CBC: Recent Labs  Lab 07/03/17 1419 07/03/17 2033 07/04/17 0517 07/05/17 0553 07/07/17 0524 07/08/17 0711  WBC 13.4* 10.8* 10.4 9.4 15.7* 10.9*  NEUTROABS 10.4*  --  6.5  --   --   --   HGB 13.4 12.7* 11.9* 12.2* 12.2* 11.7*  HCT 38.9* 37.6* 35.4* 36.9* 36.4* 34.5*  MCV 81.0 82.1 81.4 82.4 82.2 82.5  PLT 207 163 177 186 216 161   Basic Metabolic Panel: Recent Labs  Lab 07/03/17 1419 07/03/17 2033 07/04/17 0517 07/05/17 0553 07/07/17 0524  NA 129*  --  135 134* 132*  K 4.0  --  3.8 3.7 3.7  CL 91*  --  98* 95* 97*  CO2 24  --  27 29 27   GLUCOSE 528*  --  171* 195* 241*  BUN 7  --  6 7 10   CREATININE 0.92 0.81 0.72 0.82 0.79  CALCIUM 9.3  --  8.9 9.1 8.8*   Liver Function Tests: Recent Labs  Lab 07/03/17 1419  AST 26  ALT 24  ALKPHOS 114  BILITOT 0.4  PROT 7.2  ALBUMIN 3.1*   HbA1C: No results for input(s): HGBA1C in the last 72 hours. CBG: Recent Labs  Lab 07/07/17 1616 07/07/17 2036 07/08/17 0006 07/08/17 0616 07/08/17 1149  GLUCAP 260* 200* 164* 204* 281*    Recent Results (from the past 240 hour(s))  Culture, blood (Routine x 2)     Status: None   Collection Time: 07/03/17  5:34 PM  Result Value Ref Range Status   Specimen Description BLOOD RIGHT FOREARM  Final   Special Requests   Final    BOTTLES DRAWN AEROBIC AND ANAEROBIC Blood Culture adequate volume   Culture NO GROWTH 5 DAYS  Final   Report Status 07/08/2017 FINAL  Final  Culture, blood (Routine x 2)     Status: None   Collection Time: 07/03/17  5:44 PM  Result Value Ref Range Status   Specimen Description BLOOD RIGHT FOREARM DISTAL  Final   Special Requests   Final    BOTTLES DRAWN AEROBIC AND ANAEROBIC Blood Culture adequate volume   Culture NO GROWTH 5 DAYS  Final   Report Status 07/08/2017 FINAL  Final  Surgical pcr screen     Status: None   Collection Time: 07/05/17  6:29 PM  Result Value Ref Range Status   MRSA,  PCR NEGATIVE NEGATIVE Final   Staphylococcus aureus NEGATIVE NEGATIVE Final    Comment: (NOTE) The Xpert SA Assay (FDA approved for NASAL specimens in patients 59 years of age and older), is one component of a comprehensive surveillance program. It is not intended to diagnose infection nor to guide or monitor treatment.   Aerobic/Anaerobic Culture (surgical/deep wound)     Status: None (Preliminary result)   Collection Time: 07/06/17 11:37 AM  Result Value Ref Range Status   Specimen Description WOUND RIGHT FOOT  Final   Special Requests RIGHT FOOT GREAT TOE AND SECOND TOE  Final   Gram Stain   Final    ABUNDANT WBC PRESENT,BOTH PMN AND MONONUCLEAR FEW GRAM POSITIVE COCCI FEW GRAM VARIABLE ROD    Culture   Final    CULTURE REINCUBATED FOR BETTER GROWTH NO ANAEROBES ISOLATED; CULTURE IN PROGRESS FOR 5 DAYS  Report Status PENDING  Incomplete         Radiology Studies: No results found.      Scheduled Meds: . docusate sodium  100 mg Oral BID  . enoxaparin (LOVENOX) injection  40 mg Subcutaneous Q24H  . feeding supplement (ENSURE ENLIVE)  237 mL Oral BID BM  . feeding supplement (PRO-STAT SUGAR FREE 64)  30 mL Oral TID WC  . insulin aspart  0-15 Units Subcutaneous TID WC  . insulin aspart  0-5 Units Subcutaneous QHS  . insulin aspart  4 Units Subcutaneous TID WC  . insulin glargine  25 Units Subcutaneous QHS   Continuous Infusions: . sodium chloride 10 mL/hr at 07/07/17 2000  . ceFEPime (MAXIPIME) IV Stopped (07/08/17 0917)  . lactated ringers    . methocarbamol (ROBAXIN)  IV    . metronidazole Stopped (07/08/17 8315)  . vancomycin Stopped (07/08/17 1761)     LOS: 5 days     Vernell Leep, MD, FACP, [facility]. [facility] Pager [phone]  If 7PM-7AM, please contact night-coverage [email or web address] Password [email or web address] 07/08/2017, 12:50 PM

## 2017-07-09 LAB — GLUCOSE, CAPILLARY
GLUCOSE-CAPILLARY: 160 mg/dL — AB (ref 65–99)
Glucose-Capillary: 203 mg/dL — ABNORMAL HIGH (ref 65–99)
Glucose-Capillary: 320 mg/dL — ABNORMAL HIGH (ref 65–99)
Glucose-Capillary: 145 mg/dL — ABNORMAL HIGH (ref 65–99)

## 2017-07-09 MED ORDER — AMOXICILLIN-POT CLAVULANATE 875-125 MG PO TABS
1.0000 | ORAL_TABLET | Freq: Two times a day (BID) | ORAL | Status: DC
  Administered 2017-07-09 – 2017-07-10 (×3): 1 via ORAL
  Filled 2017-07-09 (×3): qty 1

## 2017-07-09 NOTE — Progress Notes (Addendum)
Physical Therapy Treatment Patient Details Name: Spencer Robles MRN: [id number] DOB: 1961/04/22 Today's Date: 07/09/2017    History of Present Illness Pt is a 56 y.o. 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 Pertinent PMH includes DM, sleep apnea.    PT Comments    Continuing work on functional mobility and activity tolerance;  Spencer Robles had many questions and concerns about managing at home; Spent more of session talking through home management; Noted Case Mgr reached out to [place] for [facility] care -- thanks!   As of 12:54: Discussed pt situation with Manuela Schwartz, Case Mgr, and Brynn, OT; Spencer Robles is experiencing financial difficulties, and has limited resources at discharge, and little to no help available to him; He participates well in PT, and worked with OT as well; Given questions about being able to manage, and identified needs for home services, if he cannot get [facility] services, it makes sense for him to have a SNF stay for rehab and nursing care. I anticipate good progress;   Have added another goal to get up from very low surface, as it sounds like Spencer Robles sleeps on an air mattress.   Follow Up Recommendations  SNF for short-term rehab and nursing care     Equipment Recommendations  Rolling walker with 5" wheels;3in1 (PT)    Recommendations for Other Services OT consult(as ordered)     Precautions / Restrictions Precautions Precautions: Fall Precaution Comments: Portable wound vac Restrictions Weight Bearing Restrictions: Yes RLE Weight Bearing: Non weight bearing    Mobility  Bed Mobility Overal bed mobility: Modified Independent             General bed mobility comments: slow moving, but no physical assist needed  Transfers Overall transfer level: Needs assistance Equipment used: Rolling walker (2 wheeled) Transfers: Sit to/from Stand Sit to Stand: Supervision          General transfer comment: pt able to maintain NWB throughout transfer  Ambulation/Gait Ambulation/Gait assistance: Supervision Ambulation Distance (Feet): 10 Feet(x2) Assistive device: Rolling walker (2 wheeled) Gait Pattern/deviations: Step-to pattern Gait velocity: Decreased   General Gait Details: Amb with RW and hop-to pattern, good ability to offload with BUEs. Supervision for safety. Good technique maintaining RLE NWB. Pt not interested in crutch training, prefers RW, and I agree   Financial trader Rankin (Stroke Patients Only)       Balance     Sitting balance-Leahy Scale: Good       Standing balance-Leahy Scale: Poor                              Cognition Arousal/Alertness: Awake/alert Behavior During Therapy: Flat affect Overall Cognitive Status: Within Functional Limits for tasks assessed                                 General Comments: pt very distracted by pending d/c and seems very anxious about this plan of care. pt states "who is going to change this? I can't " pt holding head in his hand requesting time to think but not producing any questions or thoughts with time. Therapist asking closed ended questions to help continue education and communication. pt very anxious about home but unable to  verbalize what is so upsetting even with questioning.      Exercises      General Comments General comments (skin integrity, edema, etc.): pt is able to reach R ankle with knee flexion but will be unable to reach for dressing changes. Pt could benefit from home wound care RN       Pertinent Vitals/Pain Pain Assessment: 0-10 Pain Score: 3  Faces Pain Scale: Hurts little more Pain Location: R foot Pain Descriptors / Indicators: (Phantom pain and sensitivity to temperature) Pain Intervention(s): Monitored during session    Home Living Family/patient expects to be discharged to:: Private  residence Living Arrangements: Alone Available Help at Discharge: Family;Friend(s);Available PRN/intermittently Type of Home: House Home Access: Level entry   Home Layout: One level Home Equipment: None Additional Comments: pt lives alone and mentioned possible church members but has not called anyone to ask at this time. pt requires using a laundry mat to wash clothing and currenlty sleeps on air mattress without sheets.     Prior Function Level of Independence: Independent      Comments: Works loading trucks and as Scientist, water quality at [employer]   PT Goals (current goals can now be found in the care plan section) Acute Rehab PT Goals Patient Stated Goal: Return home with some help PT Goal Formulation: With patient Time For Goal Achievement: 07/20/17 Potential to Achieve Goals: Good Progress towards PT goals: Progressing toward goals    Frequency    Min 3X/week      PT Plan Current plan remains appropriate    Co-evaluation              AM-PAC PT "6 Clicks" Daily Activity  Outcome Measure  Difficulty turning over in bed (including adjusting bedclothes, sheets and blankets)?: None Difficulty moving from lying on back to sitting on the side of the bed? : None Difficulty sitting down on and standing up from a chair with arms (e.g., wheelchair, bedside commode, etc,.)?: None Help needed moving to and from a bed to chair (including a wheelchair)?: None Help needed walking in hospital room?: A Little Help needed climbing 3-5 steps with a railing? : A Lot 6 Click Score: 21    End of Session   Activity Tolerance: Patient tolerated treatment well Patient left: in chair;Other (comment)(in Ortho gym; starting OT session) Nurse Communication: Mobility status PT Visit Diagnosis: Other abnormalities of gait and mobility (R26.89)     Time: 1034-1100 PT Time Calculation (min) (ACUTE ONLY): 26 min  Charges:  $Gait Training: 8-22 mins $Therapeutic Activity: 8-22 mins                     G Codes:       Roney Marion, PT  Acute Rehabilitation Services Pager [phone] Office [place] 07/09/2017, 12:34 PM

## 2017-07-09 NOTE — Care Management (Signed)
Match letter destroyed, patient will go to SNF.

## 2017-07-09 NOTE — Progress Notes (Signed)
Patient ID: Spencer Robles, male   DOB: April 24, 1961, 56 y.o.   MRN: [id number] Patient is 3 days status post amputation of the great toe and second toe.  Wound cultures positive for gram-positive cocci and gram variable rods.   Patient would benefit from home health nursing evaluation at home.  Patient has no complaints of pain or discomfort.  I will follow-up in the office in 2 weeks.  Continue dressing in place until follow-up.

## 2017-07-09 NOTE — Care Management Note (Signed)
Case Management Note  Patient Details  Name: Spencer Robles MRN: [id number] Date of Birth: Sep 24, 1960  Subjective/Objective:    56 yr old gentleman s/p amputation of right great toe and second toe.                Action/Plan: Case manager spoke with patient concerning discharge plan and DME. Patient is uninsured, Case manager is reaching out to [facility] to see if they can accept patient for charity home health services. Case manager has arranged appointment for Patient at the [facility] for Monday, July 23, 2017 at 10 am., patient also given [facility] letter for medication assistance.   Expected Discharge Date:   07/09/17               Expected Discharge Plan:  Home/Self Care  In-House Referral:  NA  Discharge planning Services  CM Consult, [place] [facility], [facility], Follow-up appt scheduled  Post Acute Care Choice:  Durable Medical Equipment Choice offered to:  NA  DME Arranged:  3-N-1, Walker rolling DME Agency:  [place] NA  HH Arranged:  NA HH Agency:  NA  Status of Service:  Completed, signed off  If discussed at [place] of Stay Meetings, dates discussed:    Additional Comments:  Ninfa Meeker, RN 07/09/2017, 10:56 AM

## 2017-07-09 NOTE — NC FL2 (Signed)
[place] MEDICAID FL2 LEVEL OF CARE SCREENING TOOL     IDENTIFICATION  Patient Name: Spencer Robles Birthdate: [birth year] Sex: male Admission Date (Current Location): 07/03/2017  [facility] and [place] Number:  Herbalist and Address:  [facility], [place] [address] [place], [place] [address]      Provider Number: [id number]  Attending Physician Name and Address:  Modena Jansky, MD  Relative Name and Phone Number:       Current Level of Care: Hospital Recommended Level of Care: [place] Prior Approval Number:    Date Approved/Denied:   PASRR Number: [id number]  Discharge Plan: SNF    Current Diagnoses: Patient Active Problem List   Diagnosis Date Noted  . Diabetic foot infection ([place])   . Subacute osteomyelitis, right ankle and foot ([place])   . Diabetic foot ([place]) 07/03/2017  . Diabetes mellitus type 2, uncomplicated ([place]) 76/73/4193  . Prehypertension 04/15/2015    Orientation RESPIRATION BLADDER Height & Weight     Self, Time, Situation, Place  Normal Continent Weight: 232 lb (105.2 kg) Height:  5\' 11"  (180.3 cm)  BEHAVIORAL SYMPTOMS/MOOD NEUROLOGICAL BOWEL NUTRITION STATUS      Continent Diet(See DC Summary)  AMBULATORY STATUS COMMUNICATION OF NEEDS Skin   Limited Assist Verbally Surgical wounds, Wound Vac                       Personal Care Assistance Level of Assistance  Bathing, Feeding, Dressing Bathing Assistance: Limited assistance Feeding assistance: Independent Dressing Assistance: Limited assistance     Functional Limitations Info  Sight, Hearing, Speech Sight Info: Adequate Hearing Info: Adequate Speech Info: Adequate    SPECIAL CARE FACTORS FREQUENCY  PT (By licensed PT)                    Contractures Contractures Info: Not present    Additional Factors Info  Code Status, Allergies, Insulin Sliding Scale Code Status Info: Full Allergies Info: No Known Allergies    Insulin Sliding Scale Info: Insulin daily       Current Medications (07/09/2017):  This is the current hospital active medication list Current Facility-Administered Medications  Medication Dose Route Frequency Provider Last Rate Last Dose  . 0.9 %  sodium chloride infusion   Intravenous Continuous Newt Minion, MD 10 mL/hr at 07/07/17 2000    . acetaminophen (TYLENOL) tablet 650 mg  650 mg Oral Q4H PRN Newt Minion, MD       Or  . acetaminophen (TYLENOL) suppository 650 mg  650 mg Rectal Q4H PRN Newt Minion, MD      . bisacodyl (DULCOLAX) suppository 10 mg  10 mg Rectal Daily PRN Newt Minion, MD      . ceFEPIme (MAXIPIME) 2 g in dextrose 5 % 50 mL IVPB  2 g Intravenous Q8H Karren Cobble, [place]   Stopped at 07/09/17 7902  . docusate sodium (COLACE) capsule 100 mg  100 mg Oral BID Newt Minion, MD   100 mg at 07/09/17 1134  . enoxaparin (LOVENOX) injection 40 mg  40 mg Subcutaneous Q24H Romona Curls, [place]   40 mg at 07/08/17 2242  . feeding supplement (ENSURE ENLIVE) (ENSURE ENLIVE) liquid 237 mL  237 mL Oral BID BM Modena Jansky, MD   237 mL at 07/09/17 1132  . feeding supplement (PRO-STAT SUGAR FREE 64) liquid 30 mL  30 mL Oral TID WC Dorena Dew,  FNP   30 mL at 07/09/17 1132  . HYDROmorphone (DILAUDID) injection 1 mg  1 mg Intravenous Q2H PRN Newt Minion, MD      . insulin aspart (novoLOG) injection 0-15 Units  0-15 Units Subcutaneous TID WC Modena Jansky, MD   3 Units at 07/09/17 1152  . insulin aspart (novoLOG) injection 0-5 Units  0-5 Units Subcutaneous QHS Modena Jansky, MD   2 Units at 07/08/17 2243  . insulin aspart (novoLOG) injection 4 Units  4 Units Subcutaneous TID WC Modena Jansky, MD   4 Units at 07/09/17 1151  . insulin glargine (LANTUS) injection 25 Units  25 Units Subcutaneous QHS Modena Jansky, MD   25 Units at 07/08/17 2245  . lactated ringers infusion   Intravenous Continuous Hatchett, Mateo Flow, MD      . magnesium citrate  solution 1 Bottle  1 Bottle Oral Once PRN Newt Minion, MD      . methocarbamol (ROBAXIN) tablet 500 mg  500 mg Oral Q6H PRN Newt Minion, MD   500 mg at 07/09/17 1134   Or  . methocarbamol (ROBAXIN) 500 mg in dextrose 5 % 50 mL IVPB  500 mg Intravenous Q6H PRN Newt Minion, MD      . metoCLOPramide (REGLAN) tablet 5-10 mg  5-10 mg Oral Q8H PRN Newt Minion, MD       Or  . metoCLOPramide (REGLAN) injection 5-10 mg  5-10 mg Intravenous Q8H PRN Newt Minion, MD      . metroNIDAZOLE (FLAGYL) IVPB 500 mg  500 mg Intravenous Q8H Nita Sells, MD 100 mL/hr at 07/09/17 1301 500 mg at 07/09/17 1301  . ondansetron (ZOFRAN) tablet 4 mg  4 mg Oral Q6H PRN Nita Sells, MD       Or  . ondansetron (ZOFRAN) injection 4 mg  4 mg Intravenous Q6H PRN Nita Sells, MD      . oxyCODONE (Oxy IR/ROXICODONE) immediate release tablet 10 mg  10 mg Oral Q3H PRN Newt Minion, MD   10 mg at 07/09/17 1134  . oxyCODONE (Oxy IR/ROXICODONE) immediate release tablet 5 mg  5 mg Oral Q3H PRN Newt Minion, MD   5 mg at 07/08/17 0203  . polyethylene glycol (MIRALAX / GLYCOLAX) packet 17 g  17 g Oral Daily PRN Newt Minion, MD      . senna-docusate (Senokot-S) tablet 1 tablet  1 tablet Oral QHS PRN Nita Sells, MD      . vancomycin (VANCOCIN) IVPB 750 mg/150 ml premix  750 mg Intravenous Q8H Hongalgi, Lenis Dickinson, MD 150 mL/hr at 07/09/17 1301 750 mg at 07/09/17 1301     Discharge Medications: Please see discharge summary for a list of discharge medications.  Relevant Imaging Results:  Relevant Lab Results:   Additional Information SS#: [place], LCSW

## 2017-07-09 NOTE — Clinical Social Work Note (Signed)
Clinical Social Work Assessment  Patient Details  Name: Spencer Robles MRN: [id number] Date of Birth: Jun 04, 1961  Date of referral:  07/09/17               Reason for consult:  Facility Placement                Permission sought to share information with:  Case Manager Permission granted to share information::  Yes, Verbal Permission Granted  Name::     Tree surgeon::  SNF  Relationship::  spouse  Contact Information:     Housing/Transportation Living arrangements for the past 2 months:  Apartment Source of Information:  Patient Patient Interpreter Needed:  None Criminal Activity/Legal Involvement Pertinent to Current Situation/Hospitalization:  No - Comment as needed Significant Relationships:  Adult Children, Other Family Members, Spouse Lives with:  Self Do you feel safe going back to the place where you live?  No Need for family participation in patient care:  Yes (Comment)  Care giving concerns:  Pt resides alone in apartment and his mattress is on the floor. Pt would have great difficulty getting up to manage ADL's with new impairment. Pt employed and works at [employer]. He does not have insurance and does not appear to qualify for Medicaid as financial counselors involved and confirmed same with RNCM. Given new impairment, lack of support at home, living situation, patient unsafe to return home at this time.  Pt confirmed that he has no additional income and receives at $29 for [employer]. Pt has minimal money to cover home expenses and has no money for health insurance.  CSW discussed case with Dr. Reynaldo Minium and pt approved for LOG SNF placement. CSW will f/u on same.  Social Worker assessment / plan:  CSW f/u for SNF placement with LOG placement.   Employment status:  [employer] information:  Self Pay (Medicaid Pending) PT Recommendations:  [place] / Referral to community resources:  [place]  Patient/Family's Response to care:  Patient appreciative of CSW assistance with SNF placement and is open to facility outside of local area as patient identifies that he will need more support before going home. No issue or  Concerns identified at this time.  Patient/Family's Understanding of and Emotional Response to Diagnosis, Current Treatment, and Prognosis:   Pt has good understanding of his diagnosis, current treatment and prognosis as he voiced understanding of needed more support at home and was willing to got any SNF local or outside of area. Pt identifies minimal support of cousin in the area as he cannot help him given new impairment.  CSW will continue to f/u for SNF placement.  Emotional Assessment Appearance:  Appears stated age Attitude/Demeanor/Rapport:  (Cooperative) Affect (typically observed):  Accepting, Appropriate Orientation:  Oriented to Situation, Oriented to  Time, Oriented to Place, Oriented to Self Alcohol / Substance use:  Not Applicable Psych involvement (Current and /or in the community):  No (Comment)  Discharge Needs  Concerns to be addressed:  Discharge Planning Concerns Readmission within the last 30 days:  No Current discharge risk:  Dependent with Mobility, Physical Impairment, Lives alone Barriers to Discharge:  Inadequate or no insurance   Normajean Baxter, LCSW 07/09/2017, 1:57 PM

## 2017-07-09 NOTE — Progress Notes (Addendum)
PROGRESS NOTE   Spencer Robles  FKC:[id number]    DOB: 1960-08-28    DOA: 07/03/2017  PCP: Dorena Dew, FNP   I have briefly reviewed patients previous medical records in [facility].  Brief Narrative:  56 year old male with PMH of type II DM diagnosed sometime in 2007 in the setting of DKA, hospitalized and discharged on insulin but has been off of insulin's and metformin, presented with diabetic right foot infection. Orthopedics consulted. MRI foot without osteomyelitis but shows abscess. 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   Assessment & Plan:   Active Problems:   Diabetic foot ([place])   Diabetic foot infection ([place])   Subacute osteomyelitis, right ankle and foot ([place])   1. Diabetic right foot infection/abscess and osteomyelitis of right first metatarsal: Empirically started on IV vancomycin, cefepime and metronidazole. Orthopedics was consulted. Surgery was advised which patient initially declined but subsequently consented to. 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 As per Dr. Sharol Given, completed IV antibiotics for 3 days postop due to infection extending to the margins of the amputation, nonweightbearing right lower extremity, continue wound VAC at discharge for 1 week, will need crutches or walker for nonweightbearing on right lower extremity and outpatient follow-up in 2 week. Blood cultures 2: Negative, final report. Surgical wound culture: Preliminary results show strep agalactiae and a few gram variable rods, negative fungal stain. I discussed with Dr. Sharol Given who recommended 3-4 weeks of oral antibiotics and cleared him for discharge. Therapies have seen, home health OT but no PT follow-up, rolling walker and 3 in 1. Patient is stable for discharge and this has been discussed with him for last couple days. However today patient and family indicate that patient lives alone, and prefer that he go to a NH for rehabilitation. I  have discussed in detail with case management who is exploring NH options with clinical social worker with a LOG, home medications assistance and has a follow-up appointment with new PCP. I discussed with infectious disease M.D. on call who recommends Augmentin 875 MG bid for 4 weeks. 2. Uncontrolled DM 2 with peripheral neuropathy: In ED blood sugar was 529. A1c 14.9 suggests very poor control. Started Lantus and SSI, adjusting doses as needed. Increase Lantus from 20 units to 25 units at bedtime. Continue NovoLog SSI. Add mealtime NovoLog 4 units. Patient has been extensively counseled regarding compliance with all aspects of medical care. Despite change in Lantus as above, CBGs ranging in the 200s today. Increased Lantus to 30 units at bedtime. Continue current regimen and may need further titration as outpatient. 3. Pseudohyponatremia: Secondary to hyperglycemia. Resolved. 4. Anemia: Stable. 5. Medical noncompliance: Counseled. 6. Essential hypertension: Controlled off of medications.   DVT prophylaxis: Lovenox Code Status: Full Family Communication: Discussed in detail with patient's spouse and her mother we have phone. Updated care and answered questions. Disposition: To be determined pending further input from case management and clinical social worker.   Consultants:  Orthopedics   Procedures:  right great toe and second toe amputation on 12/14.  Antimicrobials:  IV vancomycin, cefepime and metronidazole    Subjective: Patient seen ambulating him for to bleeding with the help of a rolling walker and supervision. Denies pain or any other complaints.  ROS: No dizziness, lightheadedness, chest pain, dyspnea, fever or chills reported.   Objective:  Vitals:   07/08/17 0500 07/08/17 1300 07/08/17 2112 07/09/17 0624  BP: 121/72 136/75 127/76 135/77  Pulse: Marland Kitchen)  57 73 62 69  Resp: 15 16 17 18   Temp: 98.8 F (37.1 C) 98 F (36.7 C) 98.2 F (36.8 C) 98.2 F (36.8 C)  TempSrc:  Oral Oral Oral Oral  SpO2: 98% 99% 98% 97%  Weight:      Height:        Examination:  General exam: Pleasant middle-aged male, seen ambulating comfortably with the help of a rolling walker under supervision. Respiratory system: Clear to auscultation. No increased work of breathing. Cardiovascular system: S1 and S2 heard, RRR. No JVD, murmurs or pedal edema. Gastrointestinal system: Abdomen is nondistended, soft and nontender. Normal bowel sounds heard. Central nervous system: Alert and oriented. No focal neurological deficits. Stable without change. Extremities: Symmetric 5 x 5 power. Skin: Amputated right first and second toe with wound VAC in place now. No acute findings. Stable without change. Psychiatry: Judgement and insight appear normal. Mood & affect appropriate.     Data Reviewed: I have personally reviewed following labs and imaging studies  CBC: Recent Labs  Lab 07/03/17 1419 07/03/17 2033 07/04/17 0517 07/05/17 0553 07/07/17 0524 07/08/17 0711  WBC 13.4* 10.8* 10.4 9.4 15.7* 10.9*  NEUTROABS 10.4*  --  6.5  --   --   --   HGB 13.4 12.7* 11.9* 12.2* 12.2* 11.7*  HCT 38.9* 37.6* 35.4* 36.9* 36.4* 34.5*  MCV 81.0 82.1 81.4 82.4 82.2 82.5  PLT 207 163 177 186 216 423   Basic Metabolic Panel: Recent Labs  Lab 07/03/17 1419 07/03/17 2033 07/04/17 0517 07/05/17 0553 07/07/17 0524  NA 129*  --  135 134* 132*  K 4.0  --  3.8 3.7 3.7  CL 91*  --  98* 95* 97*  CO2 24  --  27 29 27   GLUCOSE 528*  --  171* 195* 241*  BUN 7  --  6 7 10   CREATININE 0.92 0.81 0.72 0.82 0.79  CALCIUM 9.3  --  8.9 9.1 8.8*   Liver Function Tests: Recent Labs  Lab 07/03/17 1419  AST 26  ALT 24  ALKPHOS 114  BILITOT 0.4  PROT 7.2  ALBUMIN 3.1*   HbA1C: No results for input(s): HGBA1C in the last 72 hours. CBG: Recent Labs  Lab 07/08/17 1149 07/08/17 1657 07/08/17 2111 07/09/17 0629 07/09/17 1130  GLUCAP 281* 268* 242* 203* 160*    Recent Results (from the past 240  hour(s))  Culture, blood (Routine x 2)     Status: None   Collection Time: 07/03/17  5:34 PM  Result Value Ref Range Status   Specimen Description BLOOD RIGHT FOREARM  Final   Special Requests   Final    BOTTLES DRAWN AEROBIC AND ANAEROBIC Blood Culture adequate volume   Culture NO GROWTH 5 DAYS  Final   Report Status 07/08/2017 FINAL  Final  Culture, blood (Routine x 2)     Status: None   Collection Time: 07/03/17  5:44 PM  Result Value Ref Range Status   Specimen Description BLOOD RIGHT FOREARM DISTAL  Final   Special Requests   Final    BOTTLES DRAWN AEROBIC AND ANAEROBIC Blood Culture adequate volume   Culture NO GROWTH 5 DAYS  Final   Report Status 07/08/2017 FINAL  Final  Surgical pcr screen     Status: None   Collection Time: 07/05/17  6:29 PM  Result Value Ref Range Status   MRSA, PCR NEGATIVE NEGATIVE Final   Staphylococcus aureus NEGATIVE NEGATIVE Final    Comment: (NOTE) The Xpert SA  Assay (FDA approved for NASAL specimens in patients 73 years of age and older), is one component of a comprehensive surveillance program. It is not intended to diagnose infection nor to guide or monitor treatment.   Fungus Culture With Stain     Status: None (Preliminary result)   Collection Time: 07/06/17 11:37 AM  Result Value Ref Range Status   Fungus Stain Final report  Final    Comment: (NOTE) Performed At: [facility] [place], [place] [id number] Rush Farmer MD [id number]    Fungus (Mycology) Culture PENDING  Incomplete   Fungal Source WOUND  Final    Comment: RIGHT FOOT GREAT TOED AND SECOND TOE   Aerobic/Anaerobic Culture (surgical/deep wound)     Status: None (Preliminary result)   Collection Time: 07/06/17 11:37 AM  Result Value Ref Range Status   Specimen Description WOUND RIGHT FOOT  Final   Special Requests RIGHT FOOT GREAT TOE AND SECOND TOE  Final   Gram Stain   Final    ABUNDANT WBC PRESENT,BOTH PMN AND MONONUCLEAR FEW GRAM POSITIVE  COCCI FEW GRAM VARIABLE ROD    Culture   Final    MODERATE GROUP B STREP(S.AGALACTIAE)ISOLATED TESTING AGAINST S. AGALACTIAE NOT ROUTINELY PERFORMED DUE TO PREDICTABILITY OF AMP/PEN/VAN SUSCEPTIBILITY. NO ANAEROBES ISOLATED; CULTURE IN PROGRESS FOR 5 DAYS    Report Status PENDING  Incomplete  Fungus Culture Result     Status: None   Collection Time: 07/06/17 11:37 AM  Result Value Ref Range Status   Result 1 Comment  Final    Comment: (NOTE) KOH/Calcofluor preparation:  no fungus observed. Performed At: [facility] [place], [place] [id number] Rush Farmer MD IR:[id number]          Radiology Studies: No results found.      Scheduled Meds: . docusate sodium  100 mg Oral BID  . enoxaparin (LOVENOX) injection  40 mg Subcutaneous Q24H  . feeding supplement (ENSURE ENLIVE)  237 mL Oral BID BM  . feeding supplement (PRO-STAT SUGAR FREE 64)  30 mL Oral TID WC  . insulin aspart  0-15 Units Subcutaneous TID WC  . insulin aspart  0-5 Units Subcutaneous QHS  . insulin aspart  4 Units Subcutaneous TID WC  . insulin glargine  25 Units Subcutaneous QHS   Continuous Infusions: . sodium chloride 10 mL/hr at 07/07/17 2000  . ceFEPime (MAXIPIME) IV Stopped (07/09/17 0921)  . lactated ringers    . methocarbamol (ROBAXIN)  IV    . metronidazole Stopped (07/09/17 7619)  . vancomycin Stopped (07/09/17 0813)     LOS: 6 days     Vernell Leep, MD, FACP, [facility]. [facility] Pager [phone]  If 7PM-7AM, please contact night-coverage [email or web address] Password [email or web address] 07/09/2017, 12:57 PM

## 2017-07-09 NOTE — Social Work (Addendum)
CSw following up for SNF placement that will accept a LOG(letter of Guarantee) for placement as patient uninsured. Pt is amenable to local SNF and outside of area.  CSW discussing with clinical supervisor for approval LOG.  CSW f/u.  2:24 pm: Dr. Reynaldo Minium approved 30 day LOG.  CSW f/u for SNF placement.  Elissa Hefty, LCSW Clinical Social Worker [phone]

## 2017-07-09 NOTE — Social Work (Addendum)
CSW contacted SNF- [facility], [facility] and both have declined to take LOG for placement. CSW f/u with [facility], [facility], [facility] and [facility] as many other SNF's have declined to take LOG for Placement.  4:34pm [facility] and [facility] reviewing clinicals and will call CSW back.   Elissa Hefty, LCSW Clinical Social Worker [phone]

## 2017-07-09 NOTE — Evaluation (Signed)
Occupational Therapy Evaluation Patient Details Name: Spencer Robles MRN: [id number] DOB: [birth year] Today's Date: 07/09/2017    History of Present Illness Pt is a 56 y.o. 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 Pertinent PMH includes DM, sleep apnea.   Clinical Impression   Patient is s/p R great toe and 2nd toe amputation surgery resulting in functional limitations due to the deficits listed below (see OT problem list). Pt lives alone with financial challenges that will make resources upon d/c minimal.  Patient will benefit from skilled OT acutely to increase independence and safety with ADLS to allow discharge home with wound care RN.Pt is unable to reach R LE for dressing changes at this time and has no established caregiver for this task.      Follow Up Recommendations  Home health OT;Other (comment)(wound care RN)    Equipment Recommendations  3 in 1 bedside commode;Other (comment)(RW)    Recommendations for Other Services       Precautions / Restrictions Precautions Precautions: Fall Precaution Comments: Portable wound vac Restrictions Weight Bearing Restrictions: Yes RLE Weight Bearing: Non weight bearing      Mobility Bed Mobility                  Transfers Overall transfer level: Needs assistance Equipment used: Rolling walker (2 wheeled) Transfers: Sit to/from Stand Sit to Stand: Supervision         General transfer comment: pt able to maintain NWB throughout transfer    Balance                                           ADL either performed or assessed with clinical judgement   ADL Overall ADL's : Needs assistance/impaired Eating/Feeding: Set up   Grooming: Set up         Lower Body Bathing Details (indicate cue type and reason): educated on sponge bath and not to wet incision     Lower Body Dressing: Set up;Sitting/lateral leans Lower Body  Dressing Details (indicate cue type and reason): educated on lateral lean and threating R LE wound vac through pants or shorts correctly. Educated to dress R LE . Toilet Transfer: Product/process development scientist Details (indicate cue type and reason): pt plans to sit on 3n1 over toilet and reach sink for sponge bathing. pt states "no one is going in my bathroom with me" Functional mobility during ADLs: Supervision/safety General ADL Comments: pt reports having finanical struggles. pt provided x2 blankets x1 sheet and extra pair of socks to afford patient clean linen for initial d/c home until further church member (A) can be provided     Vision         Perception     Praxis      Pertinent Vitals/Pain Pain Assessment: Faces Faces Pain Scale: Hurts little more Pain Location: R foot Pain Descriptors / Indicators: Operative site guarding Pain Intervention(s): Monitored during session;Premedicated before session;Repositioned     Hand Dominance Right   Extremity/Trunk Assessment Upper Extremity Assessment Upper Extremity Assessment: Overall WFL for tasks assessed   Lower Extremity Assessment Lower Extremity Assessment: Defer to PT evaluation   Cervical / Trunk Assessment Cervical / Trunk Assessment: Normal   Communication Communication Communication: No difficulties   Cognition Arousal/Alertness: Awake/alert Behavior During Therapy:  Flat affect Overall Cognitive Status: Within Functional Limits for tasks assessed                                 General Comments: pt very distracted by pending d/c and seems very anxious about this plan of care. pt states "who is going to change this? I can't " pt holding head in his hand requesting time to think but not producing any questions or thoughts with time. Therapist asking closed ended questions to help continue education and communication. pt very anxious about home but unable to verbalize what is so  upsetting even with questioning.   General Comments  pt is able to reach R ankle with knee flexion but will be unable to reach for dressing changes. Pt could benefit from home wound care RN     Exercises     Shoulder Instructions      Home Living Family/patient expects to be discharged to:: Private residence Living Arrangements: Alone Available Help at Discharge: Family;Friend(s);Available PRN/intermittently Type of Home: House Home Access: Level entry     Home Layout: One level     Bathroom Shower/Tub: Teacher, early years/pre: Standard     Home Equipment: None   Additional Comments: pt lives alone and mentioned possible church members but has not called anyone to ask at this time. pt requires using a laundry mat to wash clothing and currenlty sleeps on air mattress without sheets.       Prior Functioning/Environment Level of Independence: Independent        Comments: Works loading trucks and as Scientist, water quality at [employer]        OT Problem List: Decreased strength;Decreased activity tolerance;Impaired balance (sitting and/or standing);Decreased safety awareness;Decreased knowledge of use of DME or AE;Decreased knowledge of precautions;Pain      OT Treatment/Interventions: Self-care/ADL training;DME and/or AE instruction;Therapeutic activities;Patient/family education;Balance training    OT Goals(Current goals can be found in the care plan section) Acute Rehab OT Goals Patient Stated Goal: Return home with some help OT Goal Formulation: With patient Time For Goal Achievement: 07/23/17 Potential to Achieve Goals: Good  OT Frequency: Min 2X/week   Barriers to D/C: Decreased caregiver support          Co-evaluation              AM-PAC PT "6 Clicks" Daily Activity     Outcome Measure Help from another person eating meals?: None Help from another person taking care of personal grooming?: A Little Help from another person toileting, which includes  using toliet, bedpan, or urinal?: A Little Help from another person bathing (including washing, rinsing, drying)?: A Little Help from another person to put on and taking off regular upper body clothing?: A Little Help from another person to put on and taking off regular lower body clothing?: A Little 6 Click Score: 19   End of Session Equipment Utilized During Treatment: Gait belt;Rolling walker Nurse Communication: Mobility status;Precautions  Activity Tolerance: Patient tolerated treatment well Patient left: in chair;with call bell/phone within reach  OT Visit Diagnosis: Unsteadiness on feet (R26.81)                Time: 1100-1120 OT Time Calculation (min): 20 min Charges:  OT General Charges $OT Visit: 1 Visit OT Evaluation $OT Eval Moderate Complexity: 1 Mod G-Codes:      Jeri Modena   OTR/L Pager: [phone] Office: [phone] .  Parke Poisson B 07/09/2017, 12:08 PM

## 2017-07-09 NOTE — Progress Notes (Signed)
Inpatient Diabetes Program Recommendations  AACE/ADA: New Consensus Statement on Inpatient Glycemic Control (2015)  Target Ranges:  Prepandial:   less than 140 mg/dL      Peak postprandial:   less than 180 mg/dL (1-2 hours)      Critically ill patients:  140 - 180 mg/dL   Lab Results  Component Value Date   GLUCAP 203 (H) 07/09/2017   HGBA1C 14.9 (H) 07/04/2017    Review of Glycemic Control Results for Spencer Robles, Spencer Robles (MRN [id number]) as of 07/09/2017 09:32  Ref. Range 07/08/2017 06:16 07/08/2017 11:49 07/08/2017 16:57 07/08/2017 21:11 07/09/2017 06:29  Glucose-Capillary Latest Ref Range: 65 - 99 mg/dL 204 (H) 281 (H) 268 (H) 242 (H) 203 (H)   Inpatient Diabetes Program Recommendations:   -Increase Lantus to 30 units qd -Increase Novolog to 6 units tid meal coverage if eats 50%  If patient goes home on Novolin 70/30 insulin for affordability: -20 units bid would equal approx. 28 units basal + 12 units meal coverage  Thank you, Bethena Roys E. Caydin Yeatts, RN, MSN, CDE  Diabetes Coordinator Inpatient Glycemic Control Team Team Pager [phone] (8am-5pm) 07/09/2017 9:41 AM

## 2017-07-10 DIAGNOSIS — E1165 Type 2 diabetes mellitus with hyperglycemia: Secondary | ICD-10-CM

## 2017-07-10 DIAGNOSIS — Z9119 Patient's noncompliance with other medical treatment and regimen: Secondary | ICD-10-CM

## 2017-07-10 LAB — CREATININE, SERUM
CREATININE: 0.7 mg/dL (ref 0.61–1.24)
GFR calc Af Amer: >60 mL/min (ref >60)

## 2017-07-10 LAB — GLUCOSE, CAPILLARY
GLUCOSE-CAPILLARY: 177 mg/dL — AB (ref 65–99)
Glucose-Capillary: 145 mg/dL — ABNORMAL HIGH (ref 65–99)
Glucose-Capillary: 233 mg/dL — ABNORMAL HIGH (ref 65–99)

## 2017-07-10 MED ORDER — DOCUSATE SODIUM 100 MG PO CAPS: 100.0000 mg | ORAL_CAPSULE | Freq: Two times a day (BID) | ORAL | Status: DC

## 2017-07-10 MED ORDER — ENSURE ENLIVE PO LIQD: 237.0000 mL | Freq: Two times a day (BID) | ORAL | Status: DC

## 2017-07-10 MED ORDER — INSULIN GLARGINE 100 UNIT/ML ~~LOC~~ SOLN: 25.0000 [IU] | Freq: Every day | SUBCUTANEOUS | Status: DC

## 2017-07-10 MED ORDER — ACETAMINOPHEN 325 MG PO TABS: 650.0000 mg | ORAL_TABLET | ORAL | Status: DC | PRN

## 2017-07-10 MED ORDER — AMOXICILLIN-POT CLAVULANATE 875-125 MG PO TABS: 1.0000 | ORAL_TABLET | Freq: Two times a day (BID) | ORAL | Status: DC

## 2017-07-10 MED ORDER — PRO-STAT SUGAR FREE PO LIQD: 30.0000 mL | Freq: Three times a day (TID) | ORAL | Status: DC

## 2017-07-10 MED ORDER — OXYCODONE HCL 5 MG PO TABS: 5.0000 mg | ORAL_TABLET | Freq: Four times a day (QID) | ORAL | 0 refills | Status: DC | PRN

## 2017-07-10 MED ORDER — INSULIN ASPART 100 UNIT/ML ~~LOC~~ SOLN: 0.0000 [IU] | Freq: Three times a day (TID) | SUBCUTANEOUS | Status: DC

## 2017-07-10 MED ORDER — POLYETHYLENE GLYCOL 3350 17 G PO PACK: 17.0000 g | PACK | Freq: Every day | ORAL | Status: DC | PRN

## 2017-07-10 MED ORDER — INSULIN ASPART 100 UNIT/ML ~~LOC~~ SOLN: 4.0000 [IU] | Freq: Three times a day (TID) | SUBCUTANEOUS | Status: DC

## 2017-07-10 NOTE — Progress Notes (Signed)
Physical Therapy Treatment Patient Details Name: Spencer Robles MRN: [id number] DOB: 02-Jun-1961 Today's Date: 07/10/2017    History of Present Illness Pt is a 56 y.o. 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 Pertinent PMH includes DM, sleep apnea.    PT Comments    Continuing work on functional mobility and activity tolerance;  Session focused on therex aimed at minimizing atrophy and weakness while RLE is NWB, and in strengthening LLE for stability in dynamic stance activities; Mr. Bruins tells me he used to enjoy lifting weights, and he seemed to enjoy the exercise.  Follow Up Recommendations  SNF     Equipment Recommendations  Rolling walker with 5" wheels;3in1 (PT)    Recommendations for Other Services       Precautions / Restrictions Precautions Precautions: Fall Precaution Comments: Portable wound vac Restrictions Weight Bearing Restrictions: Yes RLE Weight Bearing: Non weight bearing    Mobility  Bed Mobility                  Transfers Overall transfer level: Needs assistance Equipment used: Rolling walker (2 wheeled) Transfers: Sit to/from Stand Sit to Stand: Supervision         General transfer comment: Cues for hand placement and safety; pt able to maintain NWB throughout transfer  Ambulation/Gait Ambulation/Gait assistance: Supervision Ambulation Distance (Feet): (in room amb) Assistive device: Rolling walker (2 wheeled) Gait Pattern/deviations: Step-to pattern     General Gait Details: Amb with RW and hop-to pattern, good ability to offload with BUEs. Supervision for safety. Good technique maintaining RLE NWB.    Stairs            Wheelchair Mobility    Modified Rankin (Stroke Patients Only)       Balance     Sitting balance-Leahy Scale: Good       Standing balance-Leahy Scale: Poor                              Cognition  Arousal/Alertness: Awake/alert Behavior During Therapy: WFL for tasks assessed/performed Overall Cognitive Status: Within Functional Limits for tasks assessed                                        Exercises General Exercises - Lower Extremity Quad Sets: AROM;Right;10 reps Long Arc Quad: AROM;Right;10 reps Straight Leg Raises: AROM;Right;10 reps Other Exercises Other Exercises: Standing resisted hip extension, RLE; Orange theraband; 10 reps Other Exercises: Standing resisted hip abduction; RLE; Orange theraband; 10 reps Other Exercises: Standing resisted hip flexion; RLE; Orange theraband; 10 reps    General Comments        Pertinent Vitals/Pain Pain Assessment: 0-10 Pain Score: 3  Pain Location: R foot Pain Descriptors / Indicators: Aching(occasional sharpness) Pain Intervention(s): Monitored during session    Home Living                      Prior Function            PT Goals (current goals can now be found in the care plan section) Acute Rehab PT Goals Patient Stated Goal: Return home with some help PT Goal Formulation: With patient Time For Goal Achievement: 07/20/17 Potential to Achieve Goals: Good Additional Goals Additional Goal #1: Pt will demonstrate safe near-floor transfer  while keeping NWB precautions to approximate rising from low air mattress modified independently Progress towards PT goals: Progressing toward goals(Added updated goals -- see care plan)    Frequency    Min 3X/week      PT Plan Discharge plan needs to be updated    Co-evaluation              AM-PAC PT "6 Clicks" Daily Activity  Outcome Measure  Difficulty turning over in bed (including adjusting bedclothes, sheets and blankets)?: None Difficulty moving from lying on back to sitting on the side of the bed? : None Difficulty sitting down on and standing up from a chair with arms (e.g., wheelchair, bedside commode, etc,.)?: A Little Help needed  moving to and from a bed to chair (including a wheelchair)?: None Help needed walking in hospital room?: A Little Help needed climbing 3-5 steps with a railing? : A Lot 6 Click Score: 20    End of Session   Activity Tolerance: Patient tolerated treatment well Patient left: in chair;with call bell/phone within reach;Other (comment)(Dr. Hongalgi present) Nurse Communication: Mobility status PT Visit Diagnosis: Other abnormalities of gait and mobility (R26.89)     Time: 6063-0160 PT Time Calculation (min) (ACUTE ONLY): 27 min  Charges:  $Therapeutic Exercise: 8-22 mins $Therapeutic Activity: 8-22 mins                    G Codes:       Roney Marion, PT  Acute Rehabilitation Services Pager [phone] Office [phone]    Colletta Maryland 07/10/2017, 11:09 AM

## 2017-07-10 NOTE — Progress Notes (Signed)
Report given to [facility], RN at [facility].

## 2017-07-10 NOTE — Progress Notes (Signed)
Patient discharged to SNF via [facility]. 

## 2017-07-10 NOTE — Social Work (Signed)
Clinical Social Worker facilitated patient discharge including contacting patient family and facility to confirm patient discharge plans.  Clinical information faxed to facility and family agreeable with plan.    CSW arranged ambulance transport via [facility] to [facility].    RN to call [phone] to give report prior to discharge.  Clinical Social Worker will sign off for now as social work intervention is no longer needed. Please consult us again if new need arises.  Marquest Gunkel, LCSW Clinical Social Worker [phone]    

## 2017-07-10 NOTE — Clinical Social Work Placement (Signed)
   CLINICAL SOCIAL WORK PLACEMENT  NOTE  Date:  07/10/2017  Patient Details  Name: Spencer Robles MRN: [id number] Date of Birth: 1960-12-27  Clinical Social Work is seeking post-discharge placement for this patient at the [place] level of care (*CSW will initial, date and re-position this form in  chart as items are completed):  Yes   Patient/family provided with [facility] Work Department's list of facilities offering this level of care within the geographic area requested by the patient (or if unable, by the patient's family).  Yes   Patient/family informed of their freedom to choose among providers that offer the needed level of care, that participate in Medicare, Medicaid or managed care program needed by the patient, have an available bed and are willing to accept the patient.  Yes   Patient/family informed of [place]'s ownership interest in [facility] and [facility], as well as of the fact that they are under no obligation to receive care at these facilities.  PASRR submitted to EDS on       PASRR number received on 07/09/17     Existing PASRR number confirmed on       FL2 transmitted to all facilities in geographic area requested by pt/family on 07/09/17     FL2 transmitted to all facilities within larger geographic area on       Patient informed that his/her managed care company has contracts with or will negotiate with certain facilities, including the following:        Yes   Patient/family informed of bed offers received.  Patient chooses bed at [facility]     Physician recommends and patient chooses bed at      Patient to be transferred to [facility] on  .  Patient to be transferred to facility by [facility]     Patient family notified on 07/10/17 of transfer.  Name of family member notified:  patient responsible for self     PHYSICIAN Please prepare  priority discharge summary, including medications, Please prepare prescriptions     Additional Comment:    _______________________________________________ Normajean Baxter, LCSW 07/10/2017, 4:21 PM

## 2017-07-10 NOTE — Progress Notes (Signed)
Occupational Therapy Treatment Patient Details Name: Spencer Robles MRN: [id number] DOB: March 28, 1961 Today's Date: 07/10/2017    History of present illness Pt is a 56 y.o. 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 Pertinent PMH includes DM, sleep apnea.   OT comments  Pt making progress with functional goals. Pt in bathroom on toilet participating in bathing upon OT arrival. Pt reports that he is looking forward to going to a facility for rehab and that he cannot safely d/c home alone. OT will continue to follow acutely  Follow Up Recommendations  SNF(looking for SNF placement for rehab)    Equipment Recommendations  3 in 1 bedside commode    Recommendations for Other Services      Precautions / Restrictions Precautions Precautions: Fall Precaution Comments: Portable wound vac Restrictions Weight Bearing Restrictions: Yes RLE Weight Bearing: Non weight bearing       Mobility Bed Mobility               General bed mobility comments: pt in bathroom on toilet upon OT arrival  Transfers Overall transfer level: Needs assistance Equipment used: Rolling walker (2 wheeled) Transfers: Sit to/from Stand Sit to Stand: Min guard;Supervision         General transfer comment: Cues for hand placement and safety; pt able to maintain NWB throughout transfer    Balance Overall balance assessment: Needs assistance Sitting-balance support: Bilateral upper extremity supported Sitting balance-Leahy Scale: Good     Standing balance support: During functional activity;Bilateral upper extremity supported Standing balance-Leahy Scale: Poor                             ADL either performed or assessed with clinical judgement   ADL Overall ADL's : Needs assistance/impaired     Grooming: Wash/dry hands;Oral care;Standing;Min guard       Lower Body Bathing: Minimal assistance;Sitting/lateral  leans       Lower Body Dressing: Minimal assistance;Sitting/lateral leans Lower Body Dressing Details (indicate cue type and reason): min guard A with L sock, reviewed lateral leaning/bridging education for LB dressing techniques             Functional mobility during ADLs: Min guard;Supervision/safety       Vision Patient Visual Report: No change from baseline     Perception     Praxis      Cognition Arousal/Alertness: Awake/alert Behavior During Therapy: WFL for tasks assessed/performed Overall Cognitive Status: Within Functional Limits for tasks assessed                                          Exercises     Shoulder Instructions       General Comments  pt very pleasant and cooperative    Pertinent Vitals/ Pain       Pain Assessment: 0-10 Pain Score: 2  Pain Location: R foot Pain Descriptors / Indicators: Aching;Throbbing Pain Intervention(s): Monitored during session;Premedicated before session;Repositioned  Home Living                                          Prior Functioning/Environment              Frequency  Min 2X/week        Progress Toward Goals  OT Goals(current goals can now be found in the care plan section)  Progress towards OT goals: Progressing toward goals     Plan Discharge plan remains appropriate    Co-evaluation                 AM-PAC PT "6 Clicks" Daily Activity     Outcome Measure   Help from another person eating meals?: None Help from another person taking care of personal grooming?: A Little Help from another person toileting, which includes using toliet, bedpan, or urinal?: A Little Help from another person bathing (including washing, rinsing, drying)?: A Little Help from another person to put on and taking off regular upper body clothing?: A Little Help from another person to put on and taking off regular lower body clothing?: A Little 6 Click Score: 19    End  of Session Equipment Utilized During Treatment: Gait belt;Rolling walker;Other (comment)(3 in 1)  OT Visit Diagnosis: Unsteadiness on feet (R26.81);Pain   Activity Tolerance Patient tolerated treatment well   Patient Left in chair;with call bell/phone within reach   Nurse Communication      Functional Assessment Tool Used: AM-PAC 6 Clicks Daily Activity   Time: 9622-2979 OT Time Calculation (min): 17 min  Charges: OT G-codes **NOT FOR INPATIENT CLASS** Functional Assessment Tool Used: AM-PAC 6 Clicks Daily Activity OT General Charges $OT Visit: 1 Visit OT Treatments $Self Care/Home Management : 8-22 mins     Britt Bottom 07/10/2017, 3:14 PM

## 2017-07-10 NOTE — Social Work (Addendum)
CSW called Suanne Marker from [facility] to see if she had an opportunity to review clinicals that were faxed earlier.  [facility] declined to offer a bed. [facility] and [facility] still pending.  CSW met with patient at bedside to advise of updates on SNF placement.  CSW will continue to follow up.  3:41pm-CSW received a call from [facility] that they can offer and take LOG for patient.  CSW f/u with doctor on discharge summary.  CSW advised patient of same.  Elissa Hefty, LCSW Clinical Social Worker [phone]

## 2017-07-10 NOTE — Discharge Instructions (Signed)

## 2017-07-10 NOTE — Social Work (Signed)
CSW f/u with Althe[facility] Charon, [facility] and rehab, [place]. The staff at these SNF's indicated that clinicals are still be reviewed and will call CSW back shortly.  CSW will continue to follow.  Elissa Hefty, LCSW Clinical Social Worker [phone]

## 2017-07-10 NOTE — Discharge Summary (Addendum)
Physician Discharge Summary  Spencer Robles IWP:[id number] DOB: [birth year]  PCP: Dorena Dew, FNP  Admit date: 07/03/2017 Discharge date: 07/10/2017  Recommendations for Outpatient Follow-up:  1. M.D. at SNF in 3 days with repeat labs (CBC & BMP). Please follow final wound/operative cultures that were sent from the hospital on 07/06/17. 2. Dr. Meridee Score, Orthopedics in 2 weeks. SNF to arrange follow-up. 3. [facility]/PCP on 07/23/17. SNF to verify timing. 4. As per Dr. Jess Barters recommendation, remove wound VAC after 1 week and then apply dry dressing changes daily.  Home Health: None Equipment/Devices: None    Discharge Condition: Improved and stable  CODE STATUS: Full  Diet recommendation: Heart healthy & diabetic diet.  Discharge Diagnoses:  Active Problems:   Diabetic foot ([place])   Diabetic foot infection ([place])   Subacute osteomyelitis, right ankle and foot [facility])   Brief Summary: 56 year old male with PMH of type II DM diagnosed sometime in 2007 in the setting of DKA, hospitalized and discharged on insulin but has been off of insulin's and metformin, presented with diabetic right foot infection. Orthopedics consulted. MRI foot without osteomyelitis but shows abscess. 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   Assessment & Plan:   1. Diabetic right foot infection/abscess and osteomyelitis of right first metatarsal: Empirically started on IV vancomycin, cefepime and metronidazole. Orthopedics was consulted. Surgery was advised which patient initially declined but subsequently consented to. 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 As per Dr. Sharol Given, completed IV antibiotics for 3 days postop due to infection extending to the margins of the amputation, nonweightbearing right lower extremity, continue wound VAC at discharge for 1 week and should drop off by itself, and outpatient follow-up with  him in 2 weeks. Blood cultures 2: Negative, final report. Surgical wound culture: Preliminary results show strep agalactiae and a few gram variable rods, negative fungal stain. I discussed with Dr. Sharol Given who recommended 3-4 weeks of oral antibiotics and cleared him for discharge. I discussed with infectious disease M.D. on call who recommends Augmentin 875 MG bid for 4 weeks. Unsafe for patient to return home by himself because he lives alone, family unable to assist. Thereby case management/social worker were able to arrange SNF at discharge. 2. Uncontrolled DM 2 with peripheral neuropathy: In ED blood sugar was 529. A1c 14.9 suggests very poor control. Started Lantus and SSI, adjusted doses as needed in the hospital. CBG control has improved, fluctuating but mildly uncontrolled now. Patient has been extensively counseled regarding compliance with all aspects of medical care. Continue insulin regimen and metformin as below. Closely monitor and may need to be adjusted as needed. 3. Pseudohyponatremia: Secondary to hyperglycemia. Resolved. 4. Anemia: Stable. 5. Medical noncompliance: Counseled. 6. Essential hypertension: Controlled off of medications.   Consultants:  Orthopedics   Procedures:  right great toe and second toe amputation on 12/14.    Discharge Instructions  Discharge Instructions    Call MD for:  difficulty breathing, headache or visual disturbances   Complete by:  As directed    Call MD for:  extreme fatigue   Complete by:  As directed    Call MD for:  persistant dizziness or light-headedness   Complete by:  As directed    Call MD for:  persistant nausea and vomiting   Complete by:  As directed    Call MD for:  redness, tenderness, or signs of infection (pain, swelling, redness, odor or green/yellow discharge  around incision site)   Complete by:  As directed    Call MD for:  severe uncontrolled pain   Complete by:  As directed    Call MD for:  temperature >100.4    Complete by:  As directed    Diet - low sodium heart healthy   Complete by:  As directed    Diet Carb Modified   Complete by:  As directed    Increase activity slowly   Complete by:  As directed    Nonweightbearing on right lower extremity.       Medication List    TAKE these medications   acetaminophen 325 MG tablet Commonly known as:  TYLENOL Take 2 tablets (650 mg total) by mouth every 4 (four) hours as needed for mild pain, fever or headache.   amoxicillin-clavulanate 875-125 MG tablet Commonly known as:  AUGMENTIN Take 1 tablet by mouth every 12 (twelve) hours. For 4 weeks starting 07/07/17. Discontinue after 08/02/2017 doses.   docusate sodium 100 MG capsule Commonly known as:  COLACE Take 1 capsule (100 mg total) by mouth 2 (two) times daily.   feeding supplement (ENSURE ENLIVE) Liqd Take 237 mLs by mouth 2 (two) times daily between meals.   feeding supplement (PRO-STAT SUGAR FREE 64) Liqd Take 30 mLs by mouth 3 (three) times daily with meals.   insulin aspart 100 UNIT/ML injection Commonly known as:  novoLOG Inject 0-15 Units into the skin 3 (three) times daily with meals. CBG < 70: implement hypoglycemia protocol CBG 70 - 120: 0 units CBG 121 - 150: 2 units CBG 151 - 200: 3 units CBG 201 - 250: 5 units CBG 251 - 300: 8 units CBG 301 - 350: 11 units CBG 351 - 400: 15 units CBG > 400: call MD.   insulin aspart 100 UNIT/ML injection Commonly known as:  novoLOG Inject 4 Units into the skin 3 (three) times daily with meals.   insulin glargine 100 UNIT/ML injection Commonly known as:  LANTUS Inject 0.25 mLs (25 Units total) into the skin at bedtime.   metFORMIN 500 MG tablet Commonly known as:  GLUCOPHAGE Take 1 tablet (500 mg total) by mouth 2 (two) times daily with a meal.   oxyCODONE 5 MG immediate release tablet Commonly known as:  Oxy IR/ROXICODONE Take 1 tablet (5 mg total) by mouth every 6 (six) hours as needed for moderate pain or severe pain.    polyethylene glycol packet Commonly known as:  MIRALAX / GLYCOLAX Take 17 g by mouth daily as needed for mild constipation.       Contact information for follow-up providers    Newt Minion, MD Follow up in 2 week(s).   Specialty:  Orthopedic Surgery Contact information: [place] [address] [phone]        [place]. Go to.   Why:  Your appointment is scheduled for Monday, July 23, 2017 at  Contact information: [address] [place] [address] [phone]       M.D. at SNF. Schedule an appointment as soon as possible for a visit in 3 day(s).   Why:  To be seen with repeat labs (CBC & BMP).           Contact information for after-discharge care    Destination    [facility] Follow up.   Service:  Skilled Chiropodist information: [address] [place] [phone]  No Known Allergies    Procedures/Studies: Dg Chest 2 View  Result Date: 07/03/2017 CLINICAL DATA:  History of fluid buildup in the lower right leg, some pain in the right foot EXAM: CHEST  2 VIEW COMPARISON:  None. FINDINGS: No active infiltrate or effusion is seen. Mediastinal and hilar contours are unremarkable. The heart is mildly enlarged. There are degenerative changes throughout the thoracic spine. IMPRESSION: Mild cardiomegaly.  No active lung disease. Electronically Signed   By: Ivar Drape M.D.   On: 07/03/2017 16:12   Mr Foot Right Wo Contrast  Result Date: 07/04/2017 CLINICAL DATA:  Diabetic insensate neuropathy who states he has had over a 2-week history of ulceration and drainage beneath the great toe first metatarsal. EXAM: MRI OF THE RIGHT FOREFOOT WITHOUT CONTRAST TECHNIQUE: Multiplanar, multisequence MR imaging of the right forefoot was performed. No intravenous contrast was administered. COMPARISON:   None. FINDINGS: Bones/Joint/Cartilage Soft tissue ulcer along the plantar aspect of the first metatarsal head. No cortical destruction. Mild low level bone marrow edema in the first proximal phalanx likely reactive. 3.9 x 1 cm complex fluid collection in the subcutaneous fat along the medial aspect of the first proximal phalanx extending proximally into the webspace most concerning for abscess with a few low signal foci within the fluid likely reflecting air. No fracture or dislocation. Normal alignment. No joint effusion. Mild osteoarthritis of the first MTP joint. Mild osteoarthritis of the fourth TMT joint. Ligaments Collateral ligaments are intact.  Intact Lisfranc joint. Muscles and Tendons Flexor, peroneal and extensor compartment tendons are intact. Mild T2 hyperintense signal within the plantar musculature likely neurogenic. Soft tissue No fluid collection or hematoma.  No soft tissue mass. IMPRESSION: 1. Soft tissue ulcer along the plantar aspect of the first metatarsal head. Cellulitis around the first proximal phalanx with a 3.9 x 1 cm complex fluid collection in the subcutaneous fat along the medial aspect of the first proximal phalanx extending into the webspace concerning for abscess. Small amount air within the soft tissues concerning for necrotizing infection. No evidence of osteomyelitis. Electronically Signed   By: Kathreen Devoid   On: 07/04/2017 11:16   Dg Foot Complete Right  Result Date: 07/03/2017 CLINICAL DATA:  Draining wound on the plantar surface of the right great toe for 2 weeks. Diabetic patient. EXAM: RIGHT FOOT COMPLETE - 3+ VIEW COMPARISON:  None. FINDINGS: Soft tissues of the great and second toe are markedly swollen. Gas is seen in the soft tissues of the great toe along the lateral margin of the proximal phalanx. No bony destructive change or periosteal reaction. No acute fracture or dislocation. Remote healed fracture of the proximal phalanx of the second toe is noted. Negative  foreign body. IMPRESSION: Soft tissue swelling of the first and second toes with gas in the soft tissues of the first toe consistent with infection. No plain film evidence of osteomyelitis. Electronically Signed   By: Inge Rise M.D.   On: 07/03/2017 15:02      Subjective: Patient denies complaints. Just finished working with PT. Not much pain reported. No fever or chills.  Discharge Exam:  Vitals:   07/09/17 1533 07/09/17 2302 07/10/17 0700 07/10/17 1339  BP: 124/67 118/69 126/75 123/71  Pulse: 62 60 61 (!) 56  Resp: 18 17 17 17   Temp: 98.4 F (36.9 C) 98.7 F (37.1 C) 98.6 F (37 C) 98.4 F (36.9 C)  TempSrc: Oral Oral Oral Oral  SpO2: 98% 98% 95% 98%  Weight:  Height:        General exam: Pleasant middle-aged male, sitting up comfortably in a reclining chair. Respiratory system: Clear to auscultation. No increased work of breathing. Cardiovascular system: S1 and S2 heard, RRR. No JVD, murmurs or pedal edema. Gastrointestinal system: Abdomen is nondistended, soft and nontender. Normal bowel sounds heard. Central nervous system: Alert and oriented. No focal neurological deficits.  Extremities: Symmetric 5 x 5 power. Skin: Amputated right first and second toe with wound VAC in place now. No acute findings.  Psychiatry: Judgement and insight appear normal. Mood & affect appropriate.       The results of significant diagnostics from this hospitalization (including imaging, microbiology, ancillary and laboratory) are listed below for reference.     Microbiology: Recent Results (from the past 240 hour(s))  Culture, blood (Routine x 2)     Status: None   Collection Time: 07/03/17  5:34 PM  Result Value Ref Range Status   Specimen Description BLOOD RIGHT FOREARM  Final   Special Requests   Final    BOTTLES DRAWN AEROBIC AND ANAEROBIC Blood Culture adequate volume   Culture NO GROWTH 5 DAYS  Final   Report Status 07/08/2017 FINAL  Final  Culture, blood (Routine  x 2)     Status: None   Collection Time: 07/03/17  5:44 PM  Result Value Ref Range Status   Specimen Description BLOOD RIGHT FOREARM DISTAL  Final   Special Requests   Final    BOTTLES DRAWN AEROBIC AND ANAEROBIC Blood Culture adequate volume   Culture NO GROWTH 5 DAYS  Final   Report Status 07/08/2017 FINAL  Final  Surgical pcr screen     Status: None   Collection Time: 07/05/17  6:29 PM  Result Value Ref Range Status   MRSA, PCR NEGATIVE NEGATIVE Final   Staphylococcus aureus NEGATIVE NEGATIVE Final    Comment: (NOTE) The Xpert SA Assay (FDA approved for NASAL specimens in patients 78 years of age and older), is one component of a comprehensive surveillance program. It is not intended to diagnose infection nor to guide or monitor treatment.   Fungus Culture With Stain     Status: None (Preliminary result)   Collection Time: 07/06/17 11:37 AM  Result Value Ref Range Status   Fungus Stain Final report  Final    Comment: (NOTE) Performed At: [facility] [place], [place] [id number] Rush Farmer MD [id number]    Fungus (Mycology) Culture PENDING  Incomplete   Fungal Source WOUND  Final    Comment: RIGHT FOOT GREAT TOED AND SECOND TOE   Aerobic/Anaerobic Culture (surgical/deep wound)     Status: None (Preliminary result)   Collection Time: 07/06/17 11:37 AM  Result Value Ref Range Status   Specimen Description WOUND RIGHT FOOT  Final   Special Requests RIGHT FOOT GREAT TOE AND SECOND TOE  Final   Gram Stain   Final    ABUNDANT WBC PRESENT,BOTH PMN AND MONONUCLEAR FEW GRAM POSITIVE COCCI FEW GRAM VARIABLE ROD    Culture   Final    MODERATE GROUP B STREP(S.AGALACTIAE)ISOLATED TESTING AGAINST S. AGALACTIAE NOT ROUTINELY PERFORMED DUE TO PREDICTABILITY OF AMP/PEN/VAN SUSCEPTIBILITY. NO ANAEROBES ISOLATED; CULTURE IN PROGRESS FOR 5 DAYS    Report Status PENDING  Incomplete  Fungus Culture Result     Status: None   Collection Time: 07/06/17 11:37  AM  Result Value Ref Range Status   Result 1 Comment  Final    Comment: (NOTE) KOH/Calcofluor preparation:  no  fungus observed. Performed At: [facility] [place], [place] [id number] Rush Farmer MD YB:[id number]      Labs: CBC: Recent Labs  Lab 07/03/17 2033 07/04/17 0517 07/05/17 0553 07/07/17 0524 07/08/17 0711  WBC 10.8* 10.4 9.4 15.7* 10.9*  NEUTROABS  --  6.5  --   --   --   HGB 12.7* 11.9* 12.2* 12.2* 11.7*  HCT 37.6* 35.4* 36.9* 36.4* 34.5*  MCV 82.1 81.4 82.4 82.2 82.5  PLT 163 177 186 216 606   Basic Metabolic Panel: Recent Labs  Lab 07/03/17 2033 07/04/17 0517 07/05/17 0553 07/07/17 0524 07/10/17 0632  NA  --  135 134* 132*  --   K  --  3.8 3.7 3.7  --   CL  --  98* 95* 97*  --   CO2  --  27 29 27   --   GLUCOSE  --  171* 195* 241*  --   BUN  --  6 7 10   --   CREATININE 0.81 0.72 0.82 0.79 0.70  CALCIUM  --  8.9 9.1 8.8*  --    CBG: Recent Labs  Lab 07/09/17 1130 07/09/17 1626 07/09/17 2149 07/10/17 0618 07/10/17 1153  GLUCAP 160* 320* 145* 145* 177*   Urinalysis    Component Value Date/Time   COLORURINE STRAW (A) 07/03/2017 1947   APPEARANCEUR CLEAR 07/03/2017 1947   LABSPEC 1.027 07/03/2017 1947   PHURINE 6.0 07/03/2017 1947   GLUCOSEU >=500 (A) 07/03/2017 1947   HGBUR NEGATIVE 07/03/2017 [place] NEGATIVE 07/03/2017 [place] NEGATIVE 07/03/2017 1947   PROTEINUR NEGATIVE 07/03/2017 1947   UROBILINOGEN 0.2 08/17/2015 1250   NITRITE NEGATIVE 07/03/2017 1947   LEUKOCYTESUR NEGATIVE 07/03/2017 1947      Time coordinating discharge: Over 30 minutes  SIGNED:  Vernell Leep, MD, FACP, [facility]. [facility] Pager [phone]  If 7PM-7AM, please contact night-coverage [email or web address] Password [email or web address] 07/10/2017, 3:58 PM

## 2017-07-11 LAB — AEROBIC/ANAEROBIC CULTURE W GRAM STAIN (SURGICAL/DEEP WOUND)

## 2017-07-11 LAB — AEROBIC/ANAEROBIC CULTURE (SURGICAL/DEEP WOUND)

## 2017-07-23 ENCOUNTER — Ambulatory Visit (INDEPENDENT_AMBULATORY_CARE_PROVIDER_SITE_OTHER): Payer: Self-pay | Admitting: Physician Assistant

## 2017-07-23 ENCOUNTER — Telehealth (INDEPENDENT_AMBULATORY_CARE_PROVIDER_SITE_OTHER): Payer: Self-pay

## 2017-07-23 ENCOUNTER — Encounter (INDEPENDENT_AMBULATORY_CARE_PROVIDER_SITE_OTHER): Payer: Self-pay | Admitting: Physician Assistant

## 2017-07-23 ENCOUNTER — Other Ambulatory Visit: Payer: Self-pay

## 2017-07-23 ENCOUNTER — Encounter (INDEPENDENT_AMBULATORY_CARE_PROVIDER_SITE_OTHER): Payer: Self-pay | Admitting: Family

## 2017-07-23 ENCOUNTER — Ambulatory Visit (INDEPENDENT_AMBULATORY_CARE_PROVIDER_SITE_OTHER): Payer: Self-pay | Admitting: Family

## 2017-07-23 VITALS — BP 128/80 | HR 70 | Temp 97.9°F | Wt 236.6 lb

## 2017-07-23 DIAGNOSIS — M86271 Subacute osteomyelitis, right ankle and foot: Secondary | ICD-10-CM

## 2017-07-23 DIAGNOSIS — Z23 Encounter for immunization: Secondary | ICD-10-CM

## 2017-07-23 DIAGNOSIS — Z89411 Acquired absence of right great toe: Secondary | ICD-10-CM

## 2017-07-23 DIAGNOSIS — Z794 Long term (current) use of insulin: Secondary | ICD-10-CM

## 2017-07-23 DIAGNOSIS — E11628 Type 2 diabetes mellitus with other skin complications: Secondary | ICD-10-CM

## 2017-07-23 DIAGNOSIS — IMO0002 Reserved for concepts with insufficient information to code with codable children: Secondary | ICD-10-CM

## 2017-07-23 MED ORDER — INSULIN GLARGINE 100 UNIT/ML ~~LOC~~ SOLN: 30.0000 [IU] | Freq: Every day | SUBCUTANEOUS | Status: DC

## 2017-07-23 NOTE — Telephone Encounter (Signed)
Patient wants me to let you know  [place]  [phone] is the phone  number that need to for  glucose levels  To adjust his insuline

## 2017-07-23 NOTE — Telephone Encounter (Signed)
Please add that number to his contacts. Thank you.

## 2017-07-23 NOTE — Progress Notes (Signed)
Subjective:  Patient ID: Spencer Robles, male    DOB: [birth year]  Age: 56 y.o. MRN: [id number]  CC:  Hospital f/u for diabetic foot infection   HPI Spencer Robles is a 56 y.o. male with a medical history of DM2 and OSA (does not use CPAP since 2011) presents as a new patient for management of DM2. A1c 14.9% two weeks ago. Patient is at the [facility] which is a rest home/rehabilitation and they are administering his insulin and testing his blood sugar "two to three times a day". Does not have any of the glucometer readings but he thinks he may have seen 180 to 220. Endorses some visual blurring.     Patient currently attending wound care for an amputation of his right hallux and right second toe. Will be following up with his surgeon today at 1400 hrs. Does not endorse worsening pain, swelling, or redness of the RLE. Does not endorse fever, chills, nausea, or vomiting.       Outpatient Medications Prior to Visit  Medication Sig Dispense Refill  . acetaminophen (TYLENOL) 325 MG tablet Take 2 tablets (650 mg total) by mouth every 4 (four) hours as needed for mild pain, fever or headache.    . Amino Acids-Protein Hydrolys (FEEDING SUPPLEMENT, PRO-STAT SUGAR FREE 64,) LIQD Take 30 mLs by mouth 3 (three) times daily with meals.    Marland Kitchen amoxicillin-clavulanate (AUGMENTIN) 875-125 MG tablet Take 1 tablet by mouth every 12 (twelve) hours. For 4 weeks starting 07/07/17. Discontinue after 08/02/2017 doses.    Marland Kitchen docusate sodium (COLACE) 100 MG capsule Take 1 capsule (100 mg total) by mouth 2 (two) times daily.    . feeding supplement, ENSURE ENLIVE, (ENSURE ENLIVE) LIQD Take 237 mLs by mouth 2 (two) times daily between meals.    . insulin aspart (NOVOLOG) 100 UNIT/ML injection Inject 0-15 Units into the skin 3 (three) times daily with meals. CBG < 70: implement hypoglycemia protocol CBG 70 - 120: 0 units CBG 121 - 150: 2 units CBG 151 - 200: 3 units CBG 201 - 250: 5 units CBG 251 - 300: 8 units CBG  301 - 350: 11 units CBG 351 - 400: 15 units CBG > 400: call MD.    . insulin aspart (NOVOLOG) 100 UNIT/ML injection Inject 4 Units into the skin 3 (three) times daily with meals.    . insulin glargine (LANTUS) 100 UNIT/ML injection Inject 0.25 mLs (25 Units total) into the skin at bedtime.    . metFORMIN (GLUCOPHAGE) 500 MG tablet Take 1 tablet (500 mg total) by mouth 2 (two) times daily with a meal. 60 tablet 5  . oxyCODONE (OXY IR/ROXICODONE) 5 MG immediate release tablet Take 1 tablet (5 mg total) by mouth every 6 (six) hours as needed for moderate pain or severe pain. 20 tablet 0  . polyethylene glycol (MIRALAX / GLYCOLAX) packet Take 17 g by mouth daily as needed for mild constipation.     No facility-administered medications prior to visit.      ROS Review of Systems  Constitutional: Negative for chills, fever and malaise/fatigue.  Eyes: Negative for blurred vision.  Respiratory: Negative for shortness of breath.   Cardiovascular: Negative for chest pain and palpitations.  Gastrointestinal: Negative for abdominal pain and nausea.  Genitourinary: Negative for dysuria and hematuria.  Musculoskeletal: Negative for joint pain and myalgias.  Skin: Negative for rash.  Neurological: Negative for tingling and headaches.  Endo/Heme/Allergies: Positive for polydipsia.  Psychiatric/Behavioral: Negative for depression. The patient is  not nervous/anxious.     Objective:  BP 128/80 (BP Location: Left Arm, Patient Position: Sitting, Cuff Size: Large)   Pulse 70   Temp 97.9 F (36.6 C) (Oral)   Wt 236 lb 9.6 oz (107.3 kg)   SpO2 94%   BMI 33.00 kg/m   BP/Weight 07/23/2017 07/10/2017 09/38/1829  Systolic BP 937 169 -  Diastolic BP 80 71 -  Wt. (Lbs) 236.6 - 232  BMI 33 - 32.36      Physical Exam  Constitutional: He is oriented to person, place, and time.  Well developed, well nourished, NAD, polite, right foot wrapped with clean and dry dressings.   HENT:  Head: Normocephalic  and atraumatic.  Eyes: No scleral icterus.  Cardiovascular: Normal rate, regular rhythm and normal heart sounds.  Pulmonary/Chest: Effort normal and breath sounds normal. No respiratory distress. He has no wheezes.  Musculoskeletal: He exhibits no edema.  Foot wrapped with clean and dry guaze over an amputated right hallux and right second digit.   Neurological: He is alert and oriented to person, place, and time. No cranial nerve deficit. Coordination normal.  Skin: Skin is warm and dry. No rash noted. No erythema. No pallor.  Psychiatric: He has a normal mood and affect. His behavior is normal. Thought content normal.  Vitals reviewed.    Assessment & Plan:    1. Type 2 diabetes mellitus with other skin complication, with long-term current use of insulin (HCC) - Increase insulin glargine (LANTUS) 100 UNIT/ML injection; Inject 0.3 mLs (30 Units total) into the skin at bedtime.  Dispense: 10 mL  2. Need for Tdap vaccination - Future Tdap vaccine greater than or equal to 7yo IM; Future  3. Need for prophylactic vaccination against Streptococcus pneumoniae (pneumococcus) - Future Pneumococcal conjugate vaccine 13-valent; Future  4. Status post amputation of right great toe (HCC) - CBC with Differential   Meds ordered this encounter  Medications  . insulin glargine (LANTUS) 100 UNIT/ML injection    Sig: Inject 0.3 mLs (30 Units total) into the skin at bedtime.    Dispense:  10 mL    Order Specific Question:   Supervising Provider    Answer:   Tresa Garter [[id number]]    Follow-up: Return in about 4 weeks (around 08/20/2017) for diabetes.   Clent Demark PA

## 2017-07-23 NOTE — Patient Instructions (Signed)
Diabetes Mellitus and Nutrition When you have diabetes (diabetes mellitus), it is very important to have healthy eating habits because your blood sugar (glucose) levels are greatly affected by what you eat and drink. Eating healthy foods in the appropriate amounts, at about the same times every day, can help you:  Control your blood glucose.  Lower your risk of heart disease.  Improve your blood pressure.  Reach or maintain a healthy weight.  Every person with diabetes is different, and each person has different needs for a meal plan. Your health care provider may recommend that you work with a diet and nutrition specialist (dietitian) to make a meal plan that is best for you. Your meal plan may vary depending on factors such as:  The calories you need.  The medicines you take.  Your weight.  Your blood glucose, blood pressure, and cholesterol levels.  Your activity level.  Other health conditions you have, such as heart or kidney disease.  How do carbohydrates affect me? Carbohydrates affect your blood glucose level more than any other type of food. Eating carbohydrates naturally increases the amount of glucose in your blood. Carbohydrate counting is a method for keeping track of how many carbohydrates you eat. Counting carbohydrates is important to keep your blood glucose at a healthy level, especially if you use insulin or take certain oral diabetes medicines. It is important to know how many carbohydrates you can safely have in each meal. This is different for every person. Your dietitian can help you calculate how many carbohydrates you should have at each meal and for snack. Foods that contain carbohydrates include:  Bread, cereal, rice, pasta, and crackers.  Potatoes and corn.  Peas, beans, and lentils.  Milk and yogurt.  Fruit and juice.  Desserts, such as cakes, cookies, ice cream, and candy.  How does alcohol affect me? Alcohol can cause a sudden decrease in blood  glucose (hypoglycemia), especially if you use insulin or take certain oral diabetes medicines. Hypoglycemia can be a life-threatening condition. Symptoms of hypoglycemia (sleepiness, dizziness, and confusion) are similar to symptoms of having too much alcohol. If your health care provider says that alcohol is safe for you, follow these guidelines:  Limit alcohol intake to no more than 1 drink per day for nonpregnant women and 2 drinks per day for men. One drink equals 12 oz of beer, 5 oz of wine, or 1 oz of hard liquor.  Do not drink on an empty stomach.  Keep yourself hydrated with water, diet soda, or unsweetened iced tea.  Keep in mind that regular soda, juice, and other mixers may contain a lot of sugar and must be counted as carbohydrates.  What are tips for following this plan? Reading food labels  Start by checking the serving size on the label. The amount of calories, carbohydrates, fats, and other nutrients listed on the label are based on one serving of the food. Many foods contain more than one serving per package.  Check the total grams (g) of carbohydrates in one serving. You can calculate the number of servings of carbohydrates in one serving by dividing the total carbohydrates by 15. For example, if a food has 30 g of total carbohydrates, it would be equal to 2 servings of carbohydrates.  Check the number of grams (g) of saturated and trans fats in one serving. Choose foods that have low or no amount of these fats.  Check the number of milligrams (mg) of sodium in one serving. Most people   should limit total sodium intake to less than 2,300 mg per day.  Always check the nutrition information of foods labeled as "low-fat" or "nonfat". These foods may be higher in added sugar or refined carbohydrates and should be avoided.  Talk to your dietitian to identify your daily goals for nutrients listed on the label. Shopping  Avoid buying canned, premade, or processed foods. These  foods tend to be high in fat, sodium, and added sugar.  Shop around the outside edge of the grocery store. This includes fresh fruits and vegetables, bulk grains, fresh meats, and fresh dairy. Cooking  Use low-heat cooking methods, such as baking, instead of high-heat cooking methods like deep frying.  Cook using healthy oils, such as olive, canola, or sunflower oil.  Avoid cooking with butter, cream, or high-fat meats. Meal planning  Eat meals and snacks regularly, preferably at the same times every day. Avoid going long periods of time without eating.  Eat foods high in fiber, such as fresh fruits, vegetables, beans, and whole grains. Talk to your dietitian about how many servings of carbohydrates you can eat at each meal.  Eat 4-6 ounces of lean protein each day, such as lean meat, chicken, fish, eggs, or tofu. 1 ounce is equal to 1 ounce of meat, chicken, or fish, 1 egg, or 1/4 cup of tofu.  Eat some foods each day that contain healthy fats, such as avocado, nuts, seeds, and fish. Lifestyle   Check your blood glucose regularly.  Exercise at least 30 minutes 5 or more days each week, or as told by your health care provider.  Take medicines as told by your health care provider.  Do not use any products that contain nicotine or tobacco, such as cigarettes and e-cigarettes. If you need help quitting, ask your health care provider.  Work with a counselor or diabetes educator to identify strategies to manage stress and any emotional and social challenges. What are some questions to ask my health care provider?  Do I need to meet with a diabetes educator?  Do I need to meet with a dietitian?  What number can I call if I have questions?  When are the best times to check my blood glucose? Where to find more information:  American Diabetes Association: diabetes.org/food-and-fitness/food  Academy of Nutrition and Dietetics:  www.eatright.org/resources/health/diseases-and-conditions/diabetes  National Institute of Diabetes and Digestive and Kidney Diseases (NIH): www.niddk.nih.gov/health-information/diabetes/overview/diet-eating-physical-activity Summary  A healthy meal plan will help you control your blood glucose and maintain a healthy lifestyle.  Working with a diet and nutrition specialist (dietitian) can help you make a meal plan that is best for you.  Keep in mind that carbohydrates and alcohol have immediate effects on your blood glucose levels. It is important to count carbohydrates and to use alcohol carefully. This information is not intended to replace advice given to you by your health care provider. Make sure you discuss any questions you have with your health care provider. Document Released: 04/06/2005 Document Revised: 08/14/2016 Document Reviewed: 08/14/2016 Elsevier Interactive Patient Education  2018 Elsevier Inc.  

## 2017-07-24 ENCOUNTER — Encounter (INDEPENDENT_AMBULATORY_CARE_PROVIDER_SITE_OTHER): Payer: Self-pay | Admitting: Family

## 2017-07-24 DIAGNOSIS — IMO0002 Reserved for concepts with insufficient information to code with codable children: Secondary | ICD-10-CM | POA: Insufficient documentation

## 2017-07-24 LAB — CBC WITH DIFFERENTIAL/PLATELET
Basophils Absolute: 0.1 10*3/uL (ref 0.0–0.2)
Basos: 1 %
EOS (ABSOLUTE): 0.7 10*3/uL — AB (ref 0.0–0.4)
Eos: 7 %
Hematocrit: 40.1 % (ref 37.5–51.0)
Hemoglobin: 14.1 g/dL (ref 13.0–17.7)
IMMATURE GRANULOCYTES: 0 %
Immature Grans (Abs): 0 10*3/uL (ref 0.0–0.1)
Lymphocytes Absolute: 2.9 10*3/uL (ref 0.7–3.1)
Lymphs: 30 %
MCH: 28 pg (ref 26.6–33.0)
MCHC: 35.2 g/dL (ref 31.5–35.7)
MCV: 80 fL (ref 79–97)
MONOS ABS: 0.7 10*3/uL (ref 0.1–0.9)
Monocytes: 7 %
NEUTROS PCT: 55 %
Neutrophils Absolute: 5.6 10*3/uL (ref 1.4–7.0)
PLATELETS: 181 10*3/uL (ref 150–379)
RBC: 5.03 x10E6/uL (ref 4.14–5.80)
RDW: 13.9 % (ref 12.3–15.4)
WBC: 9.9 10*3/uL (ref 3.4–10.8)

## 2017-07-24 NOTE — Progress Notes (Signed)
   Post-Op Visit Note   Patient: Spencer Robles           Date of Birth: [birth year]           MRN: [id number] Visit Date: 07/23/2017 PCP: Clent Demark, PA-C  Chief Complaint:  Chief Complaint  Patient presents with  . Right Foot - Follow-up    HPI:  HPI The patient is a 57 year old gentleman who presents today 2 weeks status post right great and second toe amputation has been nonweightbearing.  Does reside at skilled nursing for rehab.  No concerns today. Ortho Exam Incision is well approximated and healing well there is no gaping no drainage no erythema minimal swelling.  Visit Diagnoses: No diagnosis found.  Plan: Harvest sutures today.  May begin touchdown weightbearing to the heel.  Anticipate releasing him to full weightbearing in regular shoewear at follow-up in 2 more weeks.  Continue with daily incisional care and dry dressings.  Follow-Up Instructions: No Follow-up on file.   Imaging: No results found.  Orders:  No orders of the defined types were placed in this encounter.  No orders of the defined types were placed in this encounter.    PMFS History: Patient Active Problem List   Diagnosis Date Noted  . Diabetic foot infection ([place])   . Subacute osteomyelitis, right ankle and foot ([place])   . Diabetic foot ([place]) 07/03/2017  . Diabetes mellitus type 2, uncomplicated ([place]) 24/40/1027  . Prehypertension 04/15/2015   Past Medical History:  Diagnosis Date  . Diabetes mellitus without complication ([place])   . Sleep apnea    does not use a cpap    No family history on file.  Past Surgical History:  Procedure Laterality Date  . AMPUTATION TOE Right 07/06/2017   Procedure: RIGHT GREAT TOE AND SECOND TOE AMPUTATION;  Surgeon: Newt Minion, MD;  Location: [facility];  Service: Orthopedics;  Laterality: Right;  . CIRCUMCISION     Social History   Occupational History  . Not on file  Tobacco Use  . Smoking status: Never Smoker  . Smokeless tobacco: Never  Used  Substance and Sexual Activity  . Alcohol use: No    Alcohol/week: 0.0 oz  . Drug use: No  . Sexual activity: Not on file

## 2017-07-25 ENCOUNTER — Telehealth (INDEPENDENT_AMBULATORY_CARE_PROVIDER_SITE_OTHER): Payer: Self-pay

## 2017-07-25 NOTE — Telephone Encounter (Signed)
Left patient a message detailing infection improving, continue on medications as indicated by the wound care specialist. Nat Christen, [place]

## 2017-07-25 NOTE — Telephone Encounter (Signed)
-----   Message from Clent Demark, PA-C sent at 07/25/2017 11:07 AM EST ----- Infection is resolving. Continue on meds as indicated by his wound care provider.

## 2017-07-26 NOTE — Telephone Encounter (Signed)
Noted  

## 2017-07-27 ENCOUNTER — Telehealth (INDEPENDENT_AMBULATORY_CARE_PROVIDER_SITE_OTHER): Payer: Self-pay

## 2017-07-27 NOTE — Telephone Encounter (Signed)
Patient is without medication. He has called [facility] and was told they don't have him in the system. I called [facility] and they in fact do not have him in the system at all. Please advise if you are able to Resend the Prescriptions or what the next course of action is. Nat Christen, CMA

## 2017-07-30 ENCOUNTER — Other Ambulatory Visit (INDEPENDENT_AMBULATORY_CARE_PROVIDER_SITE_OTHER): Payer: Self-pay | Admitting: Physician Assistant

## 2017-07-30 DIAGNOSIS — Z794 Long term (current) use of insulin: Secondary | ICD-10-CM

## 2017-07-30 DIAGNOSIS — Z760 Encounter for issue of repeat prescription: Secondary | ICD-10-CM

## 2017-07-30 DIAGNOSIS — E11628 Type 2 diabetes mellitus with other skin complications: Secondary | ICD-10-CM

## 2017-07-30 MED ORDER — INSULIN ASPART 100 UNIT/ML ~~LOC~~ SOLN: 0.0000 [IU] | Freq: Three times a day (TID) | SUBCUTANEOUS | Status: DC

## 2017-07-30 MED ORDER — INSULIN GLARGINE 100 UNIT/ML ~~LOC~~ SOLN: 30.0000 [IU] | Freq: Every day | SUBCUTANEOUS | Status: DC

## 2017-07-30 MED ORDER — INSULIN SYRINGE 29G X 1/2" 1 ML MISC
1.0000 | Freq: Four times a day (QID) | 11 refills | Status: DC
Start: 2017-07-30 — End: 2017-11-12

## 2017-07-30 NOTE — Telephone Encounter (Signed)
I have sent his insulins to [facility] on [address].

## 2017-07-31 NOTE — Telephone Encounter (Signed)
Patient aware. Tempestt S Roberts, CMA  

## 2017-08-03 LAB — FUNGUS CULTURE RESULT

## 2017-08-03 LAB — FUNGUS CULTURE WITH STAIN

## 2017-08-03 LAB — FUNGAL ORGANISM REFLEX

## 2017-08-06 ENCOUNTER — Ambulatory Visit (INDEPENDENT_AMBULATORY_CARE_PROVIDER_SITE_OTHER): Payer: Self-pay | Admitting: Orthopedic Surgery

## 2017-08-06 ENCOUNTER — Telehealth (INDEPENDENT_AMBULATORY_CARE_PROVIDER_SITE_OTHER): Payer: Self-pay | Admitting: Radiology

## 2017-08-06 ENCOUNTER — Encounter (INDEPENDENT_AMBULATORY_CARE_PROVIDER_SITE_OTHER): Payer: Self-pay | Admitting: Orthopedic Surgery

## 2017-08-06 VITALS — Ht 71.0 in | Wt 236.0 lb

## 2017-08-06 DIAGNOSIS — IMO0002 Reserved for concepts with insufficient information to code with codable children: Secondary | ICD-10-CM

## 2017-08-06 DIAGNOSIS — Z89411 Acquired absence of right great toe: Secondary | ICD-10-CM

## 2017-08-06 NOTE — Progress Notes (Signed)
Office Visit Note   Patient: Spencer Robles           Date of Birth: 1961-06-25           MRN: [id number] Visit Date: 08/06/2017              Requested by: Clent Demark, PA-C [place], [place] [id number] PCP: Clent Demark, PA-C  Chief Complaint  Patient presents with  . Right Foot - Routine Post Op    07/06/17 right great toe and second toe amputation       HPI: Patient is a 57 year old gentleman who presents status post right foot great toe and second toe amputation he is nonweightbearing with a walker.  Patient is just been discharged from skilled nursing he has been giving prescriptions for Lantus NovoLog and metformin he has started a dose of amoxicillin.  Assessment & Plan: Visit Diagnoses:  1. Great toe amputation status, right ([place])     Plan: Recommended he not take the amoxicillin patient has no clinical signs of infection the incision is well-healed.  We will keep him nonweightbearing with a walker he will start washing the foot with soap and water using a moisturizing lotion and elevate to decrease the swelling.  Follow-Up Instructions: Return in about 2 weeks (around 08/20/2017).   Ortho Exam  Patient is alert, oriented, no adenopathy, well-dressed, normal affect, normal respiratory effort. Examination patient has venous stasis swelling in the right foot and leg but there are no open ulcers.  The surgical incision is well-healed.  There is callus skin plantarly and this was debrided.  There is no redness no cellulitis no signs of infection.  Imaging: No results found. No images are attached to the encounter.  Labs: Lab Results  Component Value Date   HGBA1C 14.9 (H) 07/04/2017   HGBA1C 7.1 (H) 08/17/2015   HGBA1C 9.6 (H) 04/15/2015   ESRSEDRATE 70 (H) 07/03/2017   ESRSEDRATE 75 (H) 07/03/2017   CRP 12.9 (H) 07/03/2017   CRP 14.2 (H) 07/03/2017   REPTSTATUS 07/11/2017 FINAL 07/06/2017   GRAMSTAIN  07/06/2017    ABUNDANT WBC  PRESENT,BOTH PMN AND MONONUCLEAR FEW GRAM POSITIVE COCCI FEW GRAM VARIABLE ROD    CULT  07/06/2017    MODERATE GROUP B STREP(S.AGALACTIAE)ISOLATED TESTING AGAINST S. AGALACTIAE NOT ROUTINELY PERFORMED DUE TO PREDICTABILITY OF AMP/PEN/VAN SUSCEPTIBILITY. NO ANAEROBES ISOLATED    LABORGA ESCHERICHIA COLI 05/17/2015    @LABSALLVALUES (HGBA1)@  Body mass index is 32.92 kg/m.  Orders:  No orders of the defined types were placed in this encounter.  No orders of the defined types were placed in this encounter.    Procedures: No procedures performed  Clinical Data: No additional findings.  ROS:  All other systems negative, except as noted in the HPI. Review of Systems  Objective: Vital Signs: Ht 5\' 11"  (1.803 m)   Wt 236 lb (107 kg)   BMI 32.92 kg/m   Specialty Comments:  No specialty comments available.  PMFS History: Patient Active Problem List   Diagnosis Date Noted  . Great toe amputation status, right ([place]) 07/24/2017  . Diabetic foot infection ([place])   . Subacute osteomyelitis, right ankle and foot ([place])   . Diabetic foot ([place]) 07/03/2017  . Diabetes mellitus type 2, uncomplicated ([place]) 26/71/2458  . Prehypertension 04/15/2015   Past Medical History:  Diagnosis Date  . Diabetes mellitus without complication ([place])   . Sleep apnea    does not use a cpap    History  reviewed. No pertinent family history.  Past Surgical History:  Procedure Laterality Date  . AMPUTATION TOE Right 07/06/2017   Procedure: RIGHT GREAT TOE AND SECOND TOE AMPUTATION;  Surgeon: Newt Minion, MD;  Location: [facility];  Service: Orthopedics;  Laterality: Right;  . CIRCUMCISION     Social History   Occupational History  . Not on file  Tobacco Use  . Smoking status: Never Smoker  . Smokeless tobacco: Never Used  Substance and Sexual Activity  . Alcohol use: No    Alcohol/week: 0.0 oz  . Drug use: No  . Sexual activity: Not on file

## 2017-08-06 NOTE — Telephone Encounter (Signed)
I called and left voicemail for patient advising that [facility] paperwork is complete and at front desk for pick up.

## 2017-08-10 ENCOUNTER — Ambulatory Visit: Payer: Self-pay | Attending: Physician Assistant

## 2017-08-10 MED FILL — !LANTUS SOLOSTAR 100UNITS/M: 100 | 24 days supply | Qty: 6 | Fill #0

## 2017-08-10 MED FILL — !NOVOLOG 100UNITS/ML VIAL: 100/ML | 28 days supply | Qty: 10 | Fill #0

## 2017-08-15 ENCOUNTER — Other Ambulatory Visit: Payer: Self-pay

## 2017-08-15 ENCOUNTER — Encounter (HOSPITAL_COMMUNITY): Payer: Self-pay | Admitting: Emergency Medicine

## 2017-08-15 ENCOUNTER — Inpatient Hospital Stay (HOSPITAL_COMMUNITY)
Admission: EM | Admit: 2017-08-15 | Discharge: 2017-08-27 | DRG: 717 | Disposition: A | Discharge status: Home Health | Payer: Self-pay | Attending: Internal Medicine | Admitting: Internal Medicine

## 2017-08-15 ENCOUNTER — Emergency Department (HOSPITAL_COMMUNITY): Payer: Self-pay

## 2017-08-15 DIAGNOSIS — N453 Epididymo-orchitis: Principal | ICD-10-CM | POA: Diagnosis present

## 2017-08-15 DIAGNOSIS — N4889 Other specified disorders of penis: Secondary | ICD-10-CM | POA: Diagnosis present

## 2017-08-15 DIAGNOSIS — Z6831 Body mass index (BMI) 31.0-31.9, adult: Secondary | ICD-10-CM

## 2017-08-15 DIAGNOSIS — E669 Obesity, unspecified: Secondary | ICD-10-CM | POA: Diagnosis present

## 2017-08-15 DIAGNOSIS — E1165 Type 2 diabetes mellitus with hyperglycemia: Secondary | ICD-10-CM

## 2017-08-15 DIAGNOSIS — D2920 Benign neoplasm of unspecified testis: Secondary | ICD-10-CM

## 2017-08-15 DIAGNOSIS — E119 Type 2 diabetes mellitus without complications: Secondary | ICD-10-CM

## 2017-08-15 DIAGNOSIS — IMO0002 Reserved for concepts with insufficient information to code with codable children: Secondary | ICD-10-CM

## 2017-08-15 DIAGNOSIS — N492 Inflammatory disorders of scrotum: Secondary | ICD-10-CM | POA: Diagnosis present

## 2017-08-15 DIAGNOSIS — A419 Sepsis, unspecified organism: Secondary | ICD-10-CM

## 2017-08-15 DIAGNOSIS — E876 Hypokalemia: Secondary | ICD-10-CM | POA: Diagnosis present

## 2017-08-15 DIAGNOSIS — D696 Thrombocytopenia, unspecified: Secondary | ICD-10-CM | POA: Diagnosis present

## 2017-08-15 DIAGNOSIS — Z89421 Acquired absence of other right toe(s): Secondary | ICD-10-CM

## 2017-08-15 DIAGNOSIS — Z89411 Acquired absence of right great toe: Secondary | ICD-10-CM

## 2017-08-15 DIAGNOSIS — E874 Mixed disorder of acid-base balance: Secondary | ICD-10-CM | POA: Diagnosis present

## 2017-08-15 DIAGNOSIS — Z8739 Personal history of other diseases of the musculoskeletal system and connective tissue: Secondary | ICD-10-CM

## 2017-08-15 DIAGNOSIS — Z833 Family history of diabetes mellitus: Secondary | ICD-10-CM

## 2017-08-15 DIAGNOSIS — R6510 Systemic inflammatory response syndrome (SIRS) of non-infectious origin without acute organ dysfunction: Secondary | ICD-10-CM

## 2017-08-15 DIAGNOSIS — Z8631 Personal history of diabetic foot ulcer: Secondary | ICD-10-CM

## 2017-08-15 DIAGNOSIS — G4730 Sleep apnea, unspecified: Secondary | ICD-10-CM | POA: Diagnosis present

## 2017-08-15 DIAGNOSIS — N179 Acute kidney failure, unspecified: Secondary | ICD-10-CM | POA: Diagnosis present

## 2017-08-15 DIAGNOSIS — N5089 Other specified disorders of the male genital organs: Secondary | ICD-10-CM | POA: Diagnosis present

## 2017-08-15 DIAGNOSIS — N451 Epididymitis: Secondary | ICD-10-CM

## 2017-08-15 DIAGNOSIS — B9562 Methicillin resistant Staphylococcus aureus infection as the cause of diseases classified elsewhere: Secondary | ICD-10-CM | POA: Diagnosis present

## 2017-08-15 DIAGNOSIS — Z8619 Personal history of other infectious and parasitic diseases: Secondary | ICD-10-CM

## 2017-08-15 DIAGNOSIS — Z794 Long term (current) use of insulin: Secondary | ICD-10-CM

## 2017-08-15 HISTORY — DX: Other specified disorders of the male genital organs: N50.89

## 2017-08-15 HISTORY — DX: Epididymitis: N45.1

## 2017-08-15 HISTORY — DX: Personal history of other diseases of the musculoskeletal system and connective tissue: Z87.39

## 2017-08-15 LAB — BILIRUBIN, FRACTIONATED(TOT/DIR/INDIR)
BILIRUBIN DIRECT: 0.4 mg/dL (ref 0.1–0.5)
BILIRUBIN TOTAL: 2 mg/dL — AB (ref 0.3–1.2)
Indirect Bilirubin: 1.6 mg/dL — ABNORMAL HIGH (ref 0.3–0.9)

## 2017-08-15 LAB — COMPREHENSIVE METABOLIC PANEL
ALBUMIN: 3.6 g/dL (ref 3.5–5.0)
ALT: 22 U/L (ref 17–63)
ANION GAP: 15 (ref 5–15)
AST: 32 U/L (ref 15–41)
Alkaline Phosphatase: 82 U/L (ref 38–126)
BUN: 10 mg/dL (ref 6–20)
CHLORIDE: 96 mmol/L — AB (ref 101–111)
CO2: 22 mmol/L (ref 22–32)
Calcium: 9.3 mg/dL (ref 8.9–10.3)
Creatinine, Ser: 1.21 mg/dL (ref 0.61–1.24)
GFR calc Af Amer: >60 mL/min (ref >60)
GFR calc non Af Amer: >60 mL/min (ref >60)
GLUCOSE: 154 mg/dL — AB (ref 65–99)
Potassium: 3.5 mmol/L (ref 3.5–5.1)
SODIUM: 133 mmol/L — AB (ref 135–145)
Total Bilirubin: 2.1 mg/dL — ABNORMAL HIGH (ref 0.3–1.2)
Total Protein: 7.4 g/dL (ref 6.5–8.1)

## 2017-08-15 LAB — I-STAT CG4 LACTIC ACID, ED: Lactic Acid, Venous: 3.9 mmol/L (ref 0.5–1.9)

## 2017-08-15 LAB — URINALYSIS, ROUTINE W REFLEX MICROSCOPIC
Bilirubin Urine: NEGATIVE
GLUCOSE, UA: NEGATIVE mg/dL
Hgb urine dipstick: NEGATIVE
Ketones, ur: 5 mg/dL — AB
LEUKOCYTES UA: NEGATIVE
NITRITE: NEGATIVE
PH: 7 (ref 5.0–8.0)
Protein, ur: NEGATIVE mg/dL
SPECIFIC GRAVITY, URINE: 1.013 (ref 1.005–1.030)

## 2017-08-15 LAB — CBC WITH DIFFERENTIAL/PLATELET
BASOS PCT: 0 %
Basophils Absolute: 0 10*3/uL (ref 0.0–0.1)
EOS ABS: 0 10*3/uL (ref 0.0–0.7)
EOS PCT: 0 %
HCT: 38.3 % — ABNORMAL LOW (ref 39.0–52.0)
HEMOGLOBIN: 13.2 g/dL (ref 13.0–17.0)
LYMPHS ABS: 1.5 10*3/uL (ref 0.7–4.0)
Lymphocytes Relative: 8 %
MCH: 28.5 pg (ref 26.0–34.0)
MCHC: 34.5 g/dL (ref 30.0–36.0)
MCV: 82.7 fL (ref 78.0–100.0)
Monocytes Absolute: 1.3 10*3/uL — ABNORMAL HIGH (ref 0.1–1.0)
Monocytes Relative: 7 %
NEUTROS PCT: 85 %
Neutro Abs: 15.6 10*3/uL — ABNORMAL HIGH (ref 1.7–7.7)
PLATELETS: 126 10*3/uL — AB (ref 150–400)
RBC: 4.63 MIL/uL (ref 4.22–5.81)
RDW: 13.5 % (ref 11.5–15.5)
WBC: 18.4 10*3/uL — AB (ref 4.0–10.5)

## 2017-08-15 LAB — CBG MONITORING, ED: Glucose-Capillary: 130 mg/dL — ABNORMAL HIGH (ref 65–99)

## 2017-08-15 LAB — LACTATE DEHYDROGENASE: LDH: 180 U/L (ref 98–192)

## 2017-08-15 LAB — GLUCOSE, CAPILLARY: GLUCOSE-CAPILLARY: 201 mg/dL — AB (ref 65–99)

## 2017-08-15 MED ORDER — ACETAMINOPHEN 650 MG RE SUPP: 650.0000 mg | Freq: Four times a day (QID) | RECTAL | Status: DC | PRN

## 2017-08-15 MED ORDER — SODIUM CHLORIDE 0.9% FLUSH
3.0000 mL | Freq: Two times a day (BID) | INTRAVENOUS | Status: DC
  Administered 2017-08-15 – 2017-08-27 (×17): 3 mL via INTRAVENOUS

## 2017-08-15 MED ORDER — DEXTROSE 5 % IV SOLN
2.0000 g | INTRAVENOUS | Status: DC
  Administered 2017-08-15 – 2017-08-16 (×2): 2 g via INTRAVENOUS
  Filled 2017-08-15 (×3): qty 2

## 2017-08-15 MED ORDER — INSULIN ASPART 100 UNIT/ML ~~LOC~~ SOLN
0.0000 [IU] | Freq: Three times a day (TID) | SUBCUTANEOUS | Status: DC
  Administered 2017-08-16 – 2017-08-18 (×7): 3 [IU] via SUBCUTANEOUS

## 2017-08-15 MED ORDER — INSULIN GLARGINE 100 UNIT/ML ~~LOC~~ SOLN
20.0000 [IU] | Freq: Every day | SUBCUTANEOUS | Status: DC
  Administered 2017-08-15 – 2017-08-17 (×3): 20 [IU] via SUBCUTANEOUS
  Filled 2017-08-15 (×5): qty 0.2

## 2017-08-15 MED ORDER — SODIUM CHLORIDE 0.9 % IV SOLN
INTRAVENOUS | Status: DC
  Administered 2017-08-17: 150 mL/h via INTRAVENOUS
  Administered 2017-08-17 – 2017-08-18 (×3): via INTRAVENOUS

## 2017-08-15 MED ORDER — ENOXAPARIN SODIUM 40 MG/0.4ML ~~LOC~~ SOLN
40.0000 mg | SUBCUTANEOUS | Status: DC
  Administered 2017-08-15 – 2017-08-20 (×6): 40 mg via SUBCUTANEOUS
  Filled 2017-08-15 (×8): qty 0.4

## 2017-08-15 MED ORDER — SODIUM CHLORIDE 0.9% FLUSH: 3.0000 mL | INTRAVENOUS | Status: DC | PRN

## 2017-08-15 MED ORDER — ACETAMINOPHEN 325 MG PO TABS
650.0000 mg | ORAL_TABLET | Freq: Four times a day (QID) | ORAL | Status: DC | PRN
  Administered 2017-08-16 – 2017-08-27 (×11): 650 mg via ORAL
  Filled 2017-08-15 (×13): qty 2

## 2017-08-15 MED ORDER — OXYCODONE HCL 5 MG PO TABS
5.0000 mg | ORAL_TABLET | ORAL | Status: DC | PRN
  Administered 2017-08-15 – 2017-08-20 (×16): 5 mg via ORAL
  Filled 2017-08-15 (×16): qty 1

## 2017-08-15 MED ORDER — SODIUM CHLORIDE 0.9 % IV SOLN: 250.0000 mL | INTRAVENOUS | Status: DC | PRN

## 2017-08-15 MED ORDER — ACETAMINOPHEN 500 MG PO TABS
1000.0000 mg | ORAL_TABLET | Freq: Once | ORAL | Status: AC
Start: 2017-08-15 — End: 2017-08-15
  Administered 2017-08-15: 1000 mg via ORAL
  Filled 2017-08-15: qty 2

## 2017-08-15 MED ORDER — SODIUM CHLORIDE 0.9% FLUSH
3.0000 mL | Freq: Two times a day (BID) | INTRAVENOUS | Status: DC
  Administered 2017-08-18 – 2017-08-27 (×9): 3 mL via INTRAVENOUS

## 2017-08-15 MED ORDER — MORPHINE SULFATE (PF) 4 MG/ML IV SOLN
4.0000 mg | INTRAVENOUS | Status: DC | PRN
  Administered 2017-08-15 – 2017-08-18 (×8): 4 mg via INTRAVENOUS
  Filled 2017-08-15 (×8): qty 1

## 2017-08-15 MED ORDER — DOCUSATE SODIUM 100 MG PO CAPS
100.0000 mg | ORAL_CAPSULE | Freq: Two times a day (BID) | ORAL | Status: DC
  Administered 2017-08-15 – 2017-08-27 (×22): 100 mg via ORAL
  Filled 2017-08-15 (×23): qty 1

## 2017-08-15 MED ORDER — LEVOFLOXACIN IN D5W 500 MG/100ML IV SOLN
500.0000 mg | Freq: Once | INTRAVENOUS | Status: AC
  Administered 2017-08-15: 500 mg via INTRAVENOUS
  Filled 2017-08-15: qty 100

## 2017-08-15 MED ORDER — SENNOSIDES-DOCUSATE SODIUM 8.6-50 MG PO TABS
1.0000 | ORAL_TABLET | Freq: Two times a day (BID) | ORAL | Status: DC
  Administered 2017-08-15 – 2017-08-27 (×22): 1 via ORAL
  Filled 2017-08-15 (×22): qty 1

## 2017-08-15 MED ORDER — SODIUM CHLORIDE 0.9 % IV BOLUS (SEPSIS)
2000.0000 mL | Freq: Once | INTRAVENOUS | Status: AC
  Administered 2017-08-15: 2000 mL via INTRAVENOUS

## 2017-08-15 MED ORDER — MORPHINE SULFATE (PF) 4 MG/ML IV SOLN
4.0000 mg | Freq: Once | INTRAVENOUS | Status: AC
  Administered 2017-08-15: 4 mg via INTRAVENOUS
  Filled 2017-08-15: qty 1

## 2017-08-15 NOTE — Progress Notes (Signed)
Pharmacy Antibiotic Note  Pearley Baranek is a 57 y.o. male admitted on 08/15/2017 with epididymitis. Pharmacy has been consulted for rocephin dosing. Tm 103.1, blood cultures are pending  Plan: Rocephin 2g IV Q 24 hrs No dose adjustment needed. Pharmacy sign off     Temp (24hrs), Avg:101.2 F (38.4 C), Min:99.3 F (37.4 C), Max:103.1 F (39.5 C)  Recent Labs  Lab 08/15/17 1055 08/15/17 1309  WBC 18.4*  --   CREATININE 1.21  --   LATICACIDVEN  --  3.90*    Estimated Creatinine Clearance: 82.8 mL/min (by C-G formula based on SCr of 1.21 mg/dL).    No Known Allergies  Antimicrobials this admission: 1/23 >>  Dose adjustments this admission:   Microbiology results: 1/23 BCx:    Thank you for allowing pharmacy to be a part of this patient's care.  Maryanna Shape, PharmD, BCPS  Clinical Pharmacist  Pager: [phone]   08/15/2017 6:52 PM

## 2017-08-15 NOTE — H&P (Signed)
Date: 08/15/2017               Patient Name:  Spencer Robles MRN: [id number]  DOB: 1961-02-19 Age / Sex: 57 y.o., male   PCP: Clent Demark, PA-C         Medical Service: Internal Medicine Teaching Service         Attending Physician: Dr. Evette Doffing, Mallie Mussel, *    First Contact: Dr. Tarri Abernethy Pager: [phone]  Second Contact: Dr. Danford Bad Pager: [phone]       After Hours (After 5p/  First Contact Pager: [phone]  weekends / holidays): Second Contact Pager: [phone]   Chief Complaint: Left Scrotal Swelling  History of Present Illness: Spencer Robles is a 57 year old male with uncontrolled type 2 diabetes mellitus who presented to the emergency department with complaints of progressive scrotal pain and swelling for 2 days duration. Patient states that 2 days prior to admission he felt left-sided scrotal pain and felt like he was coming down with the flu. The scrotal pain does not radiate anywhere. He describes these feelings of flu as decreased appetite, myalgias, and diaphoresis. He states that since the onset of his symptoms things have just gotten worse and he is now having scrotal and penile swelling. He has not tried anything for self treatment other than some Excedrin Migraine for his subjective fevers. He states that >10 years ago he had a similar issue and was found to have a mass on his testicle. He states that he had it biopsied and was told that it was noncancerous. He is not sexually active and has never had an STI. He does occasionally have some yellow penile discharge. He denies any signs or symptoms of BPH other than some increased nocturia. Patient has not noticed any new wounds around his scrotum or on his penis. He does have a childhood history of mumps.  Patient does have uncontrolled type 2 diabetes and was recently admitted on 12/11 for necrotizing fasciitis of the right lower extremity. He subsequently went for amputation of the 1st and 2nd metacarpals. He was  previously not on any medications but was discharged on Lantus, Novolog, and Metformin. He does not check his sugars at home.   Patient denies abdominal pain, nausea/vomiting, dysuria, foul-smelling urine, new rashes, or difficulty urinating.  Meds:  Current Meds  Medication Sig  . acetaminophen (TYLENOL) 325 MG tablet Take 2 tablets (650 mg total) by mouth every 4 (four) hours as needed for mild pain, fever or headache.  . Amino Acids-Protein Hydrolys (FEEDING SUPPLEMENT, PRO-STAT SUGAR FREE 64,) LIQD Take 30 mLs by mouth 3 (three) times daily with meals.  . docusate sodium (COLACE) 100 MG capsule Take 1 capsule (100 mg total) by mouth 2 (two) times daily.  . insulin glargine (LANTUS) 100 UNIT/ML injection Inject 0.3 mLs (30 Units total) into the skin at bedtime. (Patient taking differently: Inject 25 Units into the skin at bedtime. )  . INSULIN SYRINGE 1CC/29G 29G X 1/2" 1 ML MISC 1 application by Does not apply route 4 (four) times daily.  . metFORMIN (GLUCOPHAGE) 500 MG tablet Take 1 tablet (500 mg total) by mouth 2 (two) times daily with a meal.  . polyethylene glycol (MIRALAX / GLYCOLAX) packet Take 17 g by mouth daily as needed for mild constipation.  . [DISCONTINUED] insulin aspart (NOVOLOG) 100 UNIT/ML injection Inject 0-15 Units into the skin 3 (three) times daily with meals. CBG < 70: implement hypoglycemia protocol CBG 70 - 120: 0 units  CBG 121 - 150: 2 units CBG 151 - 200: 3 units CBG 201 - 250: 5 units CBG 251 - 300: 8 units CBG 301 - 350: 11 units CBG 351 - 400: 15 units CBG > 400: call MD. (Patient taking differently: Inject 6 Units into the skin 3 (three) times daily with meals. )   Allergies: Allergies as of 08/15/2017  . (No Known Allergies)   Past Medical History:  Diagnosis Date  . Diabetes mellitus without complication ([place])   . Sleep apnea    does not use a cpap   Family History:  Family: + DM  Social History:  Denies the use of EtOH, Tobacco, and Illicit  substance  Review of Systems: A complete ROS was negative except as per HPI.   Physical Exam: Blood pressure 122/69, pulse (!) 101, temperature (!) 103.1 F (39.5 C), temperature source Oral, resp. rate 20, SpO2 96 %.  General: Well-nourished male in no acute distress HENT: Normocephalic, atraumatic, moist mucous membranes Pulm: Good air movement with no wheezing or crackles  CV: RRR, no murmurs, no rubs  Abdomen: Active bowel sounds, soft, non-distended, no tenderness to palpation  GU: Scrotum is erythematous and edematous. No palpable lymphadenopathy. No open wounds. There is penile edema. No crepitus felt on exam. Minimal tenderness to manipulation of the scrotum or the penis Extremities: Trace to mild pitting edema bilaterally. Amputation site on the right foot is without ulceration Skin: Warm and dry Neuro: Alert and oriented x3  Assessment & Plan by Problem: Active Problems:   Diabetes mellitus type 2, uncomplicated ([facility])   Great toe amputation status, right ([facility])   Scrotal swelling  Left Scrotal Swelling likely due to epididymitis. Spencer Robles is a 57 year old male with uncontrolled type 2 diabetes mellitus who presented to the emergency department with complaints of progressive scrotal pain and swelling for 2 days duration. Afebrile with T-max of 103.1, tachycardic, and to have several lab abnormalities including an AKI, elevated lactic acid, leukocytosis, and thrombocytopenia. Patient does meet sepsis criteria; the source of the infection is either orchitis versus epididymitis. Based on the patient's physical exam and lack of crepitus and severe pain with movement of the scrotum, necrotizing fasciitis or Fournier's gangrene is less likely. Scrotal ultrasound in the emergency department did illustrated  bilateral varicoceles with small right hydrocele and abnormal echotexture throughout the right testicle, with no reduced blood flow. Ruling out torsion or acute thrombosis. There  was also noted to be enlarged heterogeneous epididymi bilaterally consistent with epididymitis. Patient denies any current sexual activity but urine GC/Chylamidia testing have been ordered.  - Pain management with OXY IR 5 mg and IV Morphine for breakthrough pain  - Treating with Levofloxacin 500 mg QD  - Repeat lactic acid  - Urology consulted from the ED  AKI.  - Baseline creatinine of 0.7-0.8, presented with acute elevation 1.21 - Urine analysis bland with no hematuria, pyuria, or proteinuria  - Received 2L NS bolus in the ED  - Repeat BMP in the AM  Uncontrolled diabetes mellitus.   - Recent A1c 14.9 - Home regimen includes Lantus 25 units QHS, Novolog 6 units 2 times per day, and metformin 500 mg BID - Hospital regimen Lantus 20 units and SSI resistance  Thrombocytopenia  - Plt count down to 126 from 181 one month prior  - No acute bleeding  - Monitor for now. No anemia but bilirubin is elevated. Will check LDH, haptoglobin, and fractionated bilirubin  - I suspect this is secondary  to acute infection.   Diet: Carb modified VTE ppx: Lovenox  CODE STATUS: Full code   Dispo: Admit patient to Inpatient with expected length of stay greater than 2 midnights.  Signed: [name], MD 08/15/2017, 3:43 PM  My Pager: [phone]

## 2017-08-15 NOTE — ED Triage Notes (Signed)
Pt to ER for evaluation of left scrotal swelling. Pt appears to be in pain, states similar situation occurred 15+ years ago.

## 2017-08-15 NOTE — ED Notes (Signed)
MD Ray to bedside to assess patient with this RN. Swelling and redness noted to scrotum. Patient reports no difficulty urine. MD to place [place] order

## 2017-08-15 NOTE — Consult Note (Signed)
Urology Consult   Physician requesting consult: Dr. Evette Doffing  Reason for consult: Scrotal swelling  History of Present Illness: Spencer Robles is a 57 y.o. who presents to the emergency room with a 2-day history of scrotal swelling and pain.  He has had developing severe pain mostly located in the left hemiscrotum but also somewhat in the right.  He developed worsening pain today and has been noted to have fever up to 103.1 F in the emergency department.  He has denied any hematuria, dysuria, flank pain, or fever prior to presenting in the emergency department.  He does have a distant history of epididymitis around 15-20 years ago.  He is not sexually active.  He underwent a scrotal ultrasound earlier today that was equivocal.  He denies a history of voiding or storage urinary symptoms, hematuria, UTIs, STDs, urolithiasis, GU malignancy/trauma/surgery.  Past Medical History:  Diagnosis Date  . Diabetes mellitus without complication ([place])   . Sleep apnea    does not use a cpap    Past Surgical History:  Procedure Laterality Date  . AMPUTATION TOE Right 07/06/2017   Procedure: RIGHT GREAT TOE AND SECOND TOE AMPUTATION;  Surgeon: Newt Minion, MD;  Location: [place];  Service: Orthopedics;  Laterality: Right;  . CIRCUMCISION      Medications:  Home meds:  Current Meds  Medication Sig  . acetaminophen (TYLENOL) 325 MG tablet Take 2 tablets (650 mg total) by mouth every 4 (four) hours as needed for mild pain, fever or headache.  . Amino Acids-Protein Hydrolys (FEEDING SUPPLEMENT, PRO-STAT SUGAR FREE 64,) LIQD Take 30 mLs by mouth 3 (three) times daily with meals.  . docusate sodium (COLACE) 100 MG capsule Take 1 capsule (100 mg total) by mouth 2 (two) times daily.  . insulin glargine (LANTUS) 100 UNIT/ML injection Inject 0.3 mLs (30 Units total) into the skin at bedtime. (Patient taking differently: Inject 25 Units into the skin at bedtime. )  . INSULIN SYRINGE 1CC/29G 29G X 1/2" 1 ML  MISC 1 application by Does not apply route 4 (four) times daily.  . metFORMIN (GLUCOPHAGE) 500 MG tablet Take 1 tablet (500 mg total) by mouth 2 (two) times daily with a meal.  . polyethylene glycol (MIRALAX / GLYCOLAX) packet Take 17 g by mouth daily as needed for mild constipation.  . [DISCONTINUED] insulin aspart (NOVOLOG) 100 UNIT/ML injection Inject 0-15 Units into the skin 3 (three) times daily with meals. CBG < 70: implement hypoglycemia protocol CBG 70 - 120: 0 units CBG 121 - 150: 2 units CBG 151 - 200: 3 units CBG 201 - 250: 5 units CBG 251 - 300: 8 units CBG 301 - 350: 11 units CBG 351 - 400: 15 units CBG > 400: call MD. (Patient taking differently: Inject 6 Units into the skin 3 (three) times daily with meals. )    Scheduled Meds: Continuous Infusions: PRN Meds:.  Allergies: No Known Allergies  History reviewed. No pertinent family history.  Social History:  reports that  has never smoked. he has never used smokeless tobacco. He reports that he does not drink alcohol or use drugs.  ROS: A complete review of systems was performed.  All systems are negative except for pertinent findings as noted.  Physical Exam:  Vital signs in last 24 hours: Temp:  [99.3 F (37.4 C)-103.1 F (39.5 C)] 103.1 F (39.5 C) (01/23 1236) Pulse Rate:  [97-116] 101 (01/23 1430) Resp:  [20] 20 (01/23 1010) BP: (97-137)/(49-90) 122/69 (01/23 1430) SpO2:  [  96 %-100 %] 96 % (01/23 1430) Constitutional:  Alert and oriented, No acute distress Cardiovascular: Regular rate and rhythm, No JVD Respiratory: Normal respiratory effort, Lungs clear bilaterally GI: Abdomen is soft, nontender, nondistended, no abdominal masses Genitourinary: No CVAT. This penile skin is significantly edematous.  His meatus is patent.  The scrotum is severely edematous bilaterally.  He has no drainage or fluctuant areas on the scrotum.  No perineal masses or fluctuance.  He does have some mild erythema of the entire  scrotum and up into the inguinal regions bilaterally. Rectal: Normal sphincter tone, no rectal masses, prostate is non tender and without nodularity.  Lymphatic: No lymphadenopathy Neurologic: Grossly intact, no focal deficits Psychiatric: Normal mood and affect  Laboratory Data:  Recent Labs    08/15/17 1055  WBC 18.4*  HGB 13.2  HCT 38.3*  PLT 126*    Recent Labs    08/15/17 1055  NA 133*  K 3.5  CL 96*  GLUCOSE 154*  BUN 10  CALCIUM 9.3  CREATININE 1.21     Results for orders placed or performed during the hospital encounter of 08/15/17 (from the past 24 hour(s))  CBC with Differential     Status: Abnormal   Collection Time: 08/15/17 10:55 AM  Result Value Ref Range   WBC 18.4 (H) 4.0 - 10.5 K/uL   RBC 4.63 4.22 - 5.81 MIL/uL   Hemoglobin 13.2 13.0 - 17.0 g/dL   HCT 38.3 (L) 39.0 - 52.0 %   MCV 82.7 78.0 - 100.0 fL   MCH 28.5 26.0 - 34.0 pg   MCHC 34.5 30.0 - 36.0 g/dL   RDW 13.5 11.5 - 15.5 %   Platelets 126 (L) 150 - 400 K/uL   Neutrophils Relative % 85 %   Neutro Abs 15.6 (H) 1.7 - 7.7 K/uL   Lymphocytes Relative 8 %   Lymphs Abs 1.5 0.7 - 4.0 K/uL   Monocytes Relative 7 %   Monocytes Absolute 1.3 (H) 0.1 - 1.0 K/uL   Eosinophils Relative 0 %   Eosinophils Absolute 0.0 0.0 - 0.7 K/uL   Basophils Relative 0 %   Basophils Absolute 0.0 0.0 - 0.1 K/uL  Comprehensive metabolic panel     Status: Abnormal   Collection Time: 08/15/17 10:55 AM  Result Value Ref Range   Sodium 133 (L) 135 - 145 mmol/L   Potassium 3.5 3.5 - 5.1 mmol/L   Chloride 96 (L) 101 - 111 mmol/L   CO2 22 22 - 32 mmol/L   Glucose, Bld 154 (H) 65 - 99 mg/dL   BUN 10 6 - 20 mg/dL   Creatinine, Ser 1.21 0.61 - 1.24 mg/dL   Calcium 9.3 8.9 - 10.3 mg/dL   Total Protein 7.4 6.5 - 8.1 g/dL   Albumin 3.6 3.5 - 5.0 g/dL   AST 32 15 - 41 U/L   ALT 22 17 - 63 U/L   Alkaline Phosphatase 82 38 - 126 U/L   Total Bilirubin 2.1 (H) 0.3 - 1.2 mg/dL   GFR calc non Af Amer >60 >60 mL/min   GFR calc  Af Amer >60 >60 mL/min   Anion gap 15 5 - 15  Urinalysis, Routine w reflex microscopic     Status: Abnormal   Collection Time: 08/15/17 11:00 AM  Result Value Ref Range   Color, Urine YELLOW YELLOW   APPearance CLEAR CLEAR   Specific Gravity, Urine 1.013 1.005 - 1.030   pH 7.0 5.0 - 8.0   Glucose, UA  NEGATIVE NEGATIVE mg/dL   Hgb urine dipstick NEGATIVE NEGATIVE   Bilirubin Urine NEGATIVE NEGATIVE   Ketones, ur 5 (A) NEGATIVE mg/dL   Protein, ur NEGATIVE NEGATIVE mg/dL   Nitrite NEGATIVE NEGATIVE   Leukocytes, UA NEGATIVE NEGATIVE  I-Stat CG4 Lactic Acid, ED     Status: Abnormal   Collection Time: 08/15/17  1:09 PM  Result Value Ref Range   Lactic Acid, Venous 3.90 (HH) 0.5 - 1.9 mmol/L   Comment NOTIFIED PHYSICIAN    No results found for this or any previous visit (from the past 240 hour(s)).  Renal Function: Recent Labs    08/15/17 1055  CREATININE 1.21   Estimated Creatinine Clearance: 82.8 mL/min (by C-G formula based on SCr of 1.21 mg/dL).  Radiologic Imaging: US Scrotum  Result Date: 08/15/2017 CLINICAL DATA:  Right testicular swelling for 2 days. EXAM: SCROTAL ULTRASOUND DOPPLER ULTRASOUND OF THE TESTICLES TECHNIQUE: Complete ultrasound examination of the testicles, epididymis, and other scrotal structures was performed. Color and spectral Doppler ultrasound were also utilized to evaluate blood flow to the testicles. COMPARISON:  None. FINDINGS: Right testicle Measurements: 4.7 x 3.6 x 2.9 cm. Heterogeneous appearance throughout the right testicle. No measurable mass. Left testicle Measurements: 5.3 x 3.4 x 2.8 cm. No mass or microlithiasis visualized. Right epididymis:  Enlarged and heterogeneous Left epididymis:  Enlarged and heterogeneous Hydrocele:  Small right hydrocele Varicocele:  Bilateral varicoceles. Pulsed Doppler interrogation of both testes demonstrates normal low resistance blood flow in both testicles. No hyperemia. IMPRESSION: Abnormal echotexture throughout  the right testicle. No associated hyperemia. While this could reflect changes of orchitis, I cannot exclude infiltrating process/mass. Recommend urologic consultation and short-term follow-up. Enlarged, heterogeneous epididymi bilaterally, again without hyperemia. Size and heterogeneous appearance would be consistent with epididymitis, but there is no associated increase blood flow/hyperemia. Small right hydrocele. Bilateral varicoceles. Electronically Signed   By: Rolm Baptise M.D.   On: 08/15/2017 11:55   [place] Art/ven Flow Abd Pelv Doppler  Result Date: 08/15/2017 CLINICAL DATA:  Right testicular swelling for 2 days. EXAM: SCROTAL ULTRASOUND DOPPLER ULTRASOUND OF THE TESTICLES TECHNIQUE: Complete ultrasound examination of the testicles, epididymis, and other scrotal structures was performed. Color and spectral Doppler ultrasound were also utilized to evaluate blood flow to the testicles. COMPARISON:  None. FINDINGS: Right testicle Measurements: 4.7 x 3.6 x 2.9 cm. Heterogeneous appearance throughout the right testicle. No measurable mass. Left testicle Measurements: 5.3 x 3.4 x 2.8 cm. No mass or microlithiasis visualized. Right epididymis:  Enlarged and heterogeneous Left epididymis:  Enlarged and heterogeneous Hydrocele:  Small right hydrocele Varicocele:  Bilateral varicoceles. Pulsed Doppler interrogation of both testes demonstrates normal low resistance blood flow in both testicles. No hyperemia. IMPRESSION: Abnormal echotexture throughout the right testicle. No associated hyperemia. While this could reflect changes of orchitis, I cannot exclude infiltrating process/mass. Recommend urologic consultation and short-term follow-up. Enlarged, heterogeneous epididymi bilaterally, again without hyperemia. Size and heterogeneous appearance would be consistent with epididymitis, but there is no associated increase blood flow/hyperemia. Small right hydrocele. Bilateral varicoceles. Electronically Signed   By: Rolm Baptise M.D.   On: 08/15/2017 11:55    I independently reviewed the above imaging studies.  Impression/Recommendation Epididymoorchitis: His ultrasound findings and exam are consistent with epididymal orchitis.  He has no clear evidence of scrotal abscess on exam or ultrasound that would require surgical intervention.  I would recommend broad-spectrum IV antibiotic therapy such as ceftriaxone pending final blood and urine cultures.  Will follow his white blood count and  clinical exams to ensure that he does not develop a scrotal abscess that would require surgical intervention.  He is currently awaiting admission per medicine.  I will follow him during his hospitalization.  Whitney Bingaman,LES 08/15/2017, 3:46 PM    Pryor Curia MD  CC: Dr. Evette Doffing

## 2017-08-15 NOTE — ED Notes (Signed)
Heart Healthy Diet ordered for Dinner.

## 2017-08-15 NOTE — ED Notes (Signed)
Patient transported to Ultrasound 

## 2017-08-15 NOTE — ED Provider Notes (Signed)
[place] EMERGENCY DEPARTMENT Provider Note   CSN: [id number] Arrival date & time: 08/15/17  3790     History   Chief Complaint Chief Complaint  Patient presents with  . Groin Swelling    HPI Spencer Robles is a 57 y.o. male.  The history is provided by the patient and medical records. No language interpreter was used.   Spencer Robles is a 57 y.o. male  with a PMH of DM2 who presents to the Emergency Department complaining of progressively worsening scrotal pain for the last 2 days.  Associated with scrotal swelling.  Pain and swelling worse on the left than the right.  He states he initially felt a scab or skin irritation to the posterior aspect of the left side of the scrotum and feels as if something there may have gotten infected. No medications taken prior to arrival for symptoms.  Denies dysuria, urinary urgency or frequency.  He does endorse a history of similar around 2001 where he was seen at [facility] with [facility].  He is unsure of the diagnosis at that time, but states that it was not cancer and they gave him medicine which made it go away.  He is a type II diabetic on metformin and lantus. He has been taking medications as described but is unsure what his sugars have been running lately as he has no glucometer. Felt hot and cold but did not check temperature at home.    Past Medical History:  Diagnosis Date  . Diabetes mellitus without complication ([place])   . Sleep apnea    does not use a cpap    Patient Active Problem List   Diagnosis Date Noted  . Scrotal swelling 08/15/2017  . Great toe amputation status, right ([place]) 07/24/2017  . Diabetic foot infection ([place])   . Subacute osteomyelitis, right ankle and foot ([place])   . Diabetic foot ([place]) 07/03/2017  . Diabetes mellitus type 2, uncomplicated ([place]) 24/03/7352  . Prehypertension 04/15/2015    Past Surgical History:  Procedure Laterality Date  . AMPUTATION TOE Right 07/06/2017   Procedure: RIGHT GREAT TOE AND SECOND TOE AMPUTATION;  Surgeon: Newt Minion, MD;  Location: [place];  Service: Orthopedics;  Laterality: Right;  . CIRCUMCISION         Home Medications    Prior to Admission medications   Medication Sig Start Date End Date Taking? Authorizing Provider  acetaminophen (TYLENOL) 325 MG tablet Take 2 tablets (650 mg total) by mouth every 4 (four) hours as needed for mild pain, fever or headache. 07/10/17  Yes Hongalgi, Lenis Dickinson, MD  Amino Acids-Protein Hydrolys (FEEDING SUPPLEMENT, PRO-STAT SUGAR FREE 64,) LIQD Take 30 mLs by mouth 3 (three) times daily with meals. 07/10/17  Yes Hongalgi, Lenis Dickinson, MD  docusate sodium (COLACE) 100 MG capsule Take 1 capsule (100 mg total) by mouth 2 (two) times daily. 07/10/17  Yes Hongalgi, Lenis Dickinson, MD  insulin glargine (LANTUS) 100 UNIT/ML injection Inject 0.3 mLs (30 Units total) into the skin at bedtime. Patient taking differently: Inject 25 Units into the skin at bedtime.  07/30/17  Yes Clent Demark, PA-C  INSULIN SYRINGE 1CC/29G 29G X 1/2" 1 ML MISC 1 application by Does not apply route 4 (four) times daily. 07/30/17  Yes Clent Demark, PA-C  metFORMIN (GLUCOPHAGE) 500 MG tablet Take 1 tablet (500 mg total) by mouth 2 (two) times daily with a meal. 08/18/15  Yes Dorena Dew, FNP  polyethylene glycol [facility] /  GLYCOLAX) packet Take 17 g by mouth daily as needed for mild constipation. 07/10/17  Yes Hongalgi, Lenis Dickinson, MD  amoxicillin-clavulanate (AUGMENTIN) 875-125 MG tablet Take 1 tablet by mouth every 12 (twelve) hours. For 4 weeks starting 07/07/17. Discontinue after 08/02/2017 doses. Patient not taking: Reported on 08/15/2017 07/10/17   Modena Jansky, MD  feeding supplement, ENSURE ENLIVE, (ENSURE ENLIVE) LIQD Take 237 mLs by mouth 2 (two) times daily between meals. 07/10/17   Hongalgi, Lenis Dickinson, MD  insulin aspart (NOVOLOG) 100 UNIT/ML injection Inject 4 Units into the skin 3 (three) times daily with  meals. Patient taking differently: Inject 6 Units into the skin 2 (two) times daily.  07/10/17   Hongalgi, Lenis Dickinson, MD  oxyCODONE (OXY IR/ROXICODONE) 5 MG immediate release tablet Take 1 tablet (5 mg total) by mouth every 6 (six) hours as needed for moderate pain or severe pain. Patient not taking: Reported on 08/15/2017 07/10/17   Modena Jansky, MD    Family History History reviewed. No pertinent family history.  Social History Social History   Tobacco Use  . Smoking status: Never Smoker  . Smokeless tobacco: Never Used  Substance Use Topics  . Alcohol use: No    Alcohol/week: 0.0 oz  . Drug use: No     Allergies   Patient has no known allergies.   Review of Systems Review of Systems  Constitutional: Positive for chills and fever (Subjective).  Genitourinary: Positive for scrotal swelling and testicular pain. Negative for difficulty urinating, dysuria and penile swelling.  All other systems reviewed and are negative.    Physical Exam Updated Vital Signs BP 122/69   Pulse (!) 101   Temp (!) 103.1 F (39.5 C) (Oral)   Resp 20   SpO2 96%   Physical Exam  Constitutional: He is oriented to person, place, and time. He appears well-developed and well-nourished. No distress.  HENT:  Head: Normocephalic and atraumatic.  Cardiovascular: Normal heart sounds.  No murmur heard. Mildly tachycardic but regular.  Pulmonary/Chest: Effort normal and breath sounds normal. No respiratory distress.  Abdominal: Soft. He exhibits no distension. There is no tenderness.  Genitourinary:  Genitourinary Comments: Bilateral scrotal swelling and tenderness, L>R. + erythema.  Neurological: He is alert and oriented to person, place, and time.  Skin: Skin is warm and dry.  Nursing note and vitals reviewed.    ED Treatments / Results  Labs (all labs ordered are listed, but only abnormal results are displayed) Labs Reviewed  CBC WITH DIFFERENTIAL/PLATELET - Abnormal; Notable for the  following components:      Result Value   WBC 18.4 (*)    HCT 38.3 (*)    Platelets 126 (*)    Neutro Abs 15.6 (*)    Monocytes Absolute 1.3 (*)    All other components within normal limits  COMPREHENSIVE METABOLIC PANEL - Abnormal; Notable for the following components:   Sodium 133 (*)    Chloride 96 (*)    Glucose, Bld 154 (*)    Total Bilirubin 2.1 (*)    All other components within normal limits  URINALYSIS, ROUTINE W REFLEX MICROSCOPIC - Abnormal; Notable for the following components:   Ketones, ur 5 (*)    All other components within normal limits  I-STAT CG4 LACTIC ACID, ED - Abnormal; Notable for the following components:   Lactic Acid, Venous 3.90 (*)    All other components within normal limits  CULTURE, BLOOD (ROUTINE X 2)  CULTURE, BLOOD (ROUTINE X 2)  I-STAT CG4 LACTIC ACID, ED  GC/CHLAMYDIA PROBE AMP ([place]) NOT AT [facility]    EKG  EKG Interpretation None       Radiology US Scrotum  Result Date: 08/15/2017 CLINICAL DATA:  Right testicular swelling for 2 days. EXAM: SCROTAL ULTRASOUND DOPPLER ULTRASOUND OF THE TESTICLES TECHNIQUE: Complete ultrasound examination of the testicles, epididymis, and other scrotal structures was performed. Color and spectral Doppler ultrasound were also utilized to evaluate blood flow to the testicles. COMPARISON:  None. FINDINGS: Right testicle Measurements: 4.7 x 3.6 x 2.9 cm. Heterogeneous appearance throughout the right testicle. No measurable mass. Left testicle Measurements: 5.3 x 3.4 x 2.8 cm. No mass or microlithiasis visualized. Right epididymis:  Enlarged and heterogeneous Left epididymis:  Enlarged and heterogeneous Hydrocele:  Small right hydrocele Varicocele:  Bilateral varicoceles. Pulsed Doppler interrogation of both testes demonstrates normal low resistance blood flow in both testicles. No hyperemia. IMPRESSION: Abnormal echotexture throughout the right testicle. No associated hyperemia. While this could reflect changes  of orchitis, I cannot exclude infiltrating process/mass. Recommend urologic consultation and short-term follow-up. Enlarged, heterogeneous epididymi bilaterally, again without hyperemia. Size and heterogeneous appearance would be consistent with epididymitis, but there is no associated increase blood flow/hyperemia. Small right hydrocele. Bilateral varicoceles. Electronically Signed   By: Rolm Baptise M.D.   On: 08/15/2017 11:55   [place] Art/ven Flow Abd Pelv Doppler  Result Date: 08/15/2017 CLINICAL DATA:  Right testicular swelling for 2 days. EXAM: SCROTAL ULTRASOUND DOPPLER ULTRASOUND OF THE TESTICLES TECHNIQUE: Complete ultrasound examination of the testicles, epididymis, and other scrotal structures was performed. Color and spectral Doppler ultrasound were also utilized to evaluate blood flow to the testicles. COMPARISON:  None. FINDINGS: Right testicle Measurements: 4.7 x 3.6 x 2.9 cm. Heterogeneous appearance throughout the right testicle. No measurable mass. Left testicle Measurements: 5.3 x 3.4 x 2.8 cm. No mass or microlithiasis visualized. Right epididymis:  Enlarged and heterogeneous Left epididymis:  Enlarged and heterogeneous Hydrocele:  Small right hydrocele Varicocele:  Bilateral varicoceles. Pulsed Doppler interrogation of both testes demonstrates normal low resistance blood flow in both testicles. No hyperemia. IMPRESSION: Abnormal echotexture throughout the right testicle. No associated hyperemia. While this could reflect changes of orchitis, I cannot exclude infiltrating process/mass. Recommend urologic consultation and short-term follow-up. Enlarged, heterogeneous epididymi bilaterally, again without hyperemia. Size and heterogeneous appearance would be consistent with epididymitis, but there is no associated increase blood flow/hyperemia. Small right hydrocele. Bilateral varicoceles. Electronically Signed   By: Rolm Baptise M.D.   On: 08/15/2017 11:55    Procedures Procedures (including  critical care time)  CRITICAL CARE Performed by: Ozella Almond Ward  Total critical care time: 35 minutes  Critical care time was exclusive of separately billable procedures and treating other patients.  Critical care was necessary to treat or prevent imminent or life-threatening deterioration.  Critical care was time spent personally by me on the following activities: development of treatment plan with patient and/or surrogate as well as nursing, discussions with consultants, evaluation of patient's response to treatment, examination of patient, obtaining history from patient or surrogate, ordering and performing treatments and interventions, ordering and review of laboratory studies, ordering and review of radiographic studies, pulse oximetry and re-evaluation of patient's condition.   Medications Ordered in ED Medications  morphine 4 MG/ML injection 4 mg (4 mg Intravenous Given 08/15/17 1235)  levofloxacin (LEVAQUIN) IVPB 500 mg (0 mg Intravenous Stopped 08/15/17 1335)  acetaminophen (TYLENOL) tablet 1,000 mg (1,000 mg Oral Given 08/15/17 1301)  sodium chloride 0.9 % bolus  2,000 mL (2,000 mLs Intravenous New Bag/Given 08/15/17 1338)     Initial Impression / Assessment and Plan / ED Course  I have reviewed the triage vital signs and the nursing notes.  Pertinent labs & imaging results that were available during my care of the patient were reviewed by me and considered in my medical decision making (see chart for details).    Spencer Robles is a 57 y.o. male who presents to ED for progressively worsening scrotal pain and swelling x 2 days. Upon arrival to ED, mildly tachycardic and afebrile with temp of 99.3. On exam, patient with scrotal swelling, tenderness and erythema. Pain meds given. Labs reviewed: elevated white count of 18.4, glucose 154. Ultrasound obtained:  IMPRESSION: Abnormal echotexture throughout the right testicle. No associated hyperemia. While this could reflect  changes of orchitis, I cannot exclude infiltrating process/mass. Recommend urologic consultation and short-term follow-up.  Enlarged, heterogeneous epididymi bilaterally, again without hyperemia. Size and heterogeneous appearance would be consistent with epididymitis, but there is no associated increase blood flow/hyperemia.  12:14 PM - Patient started on Levaquin.   12:49 PM - Consulted urology, Dr. Alinda Money, who will come evaluate patient. Repeat temperature with fever of 103.1. Blood cx's and lactic added on.   Lactic 3.90. Fluids initiated.   Teaching service consulted who will admit.   Patient seen by and discussed with Dr. Jeanell Sparrow who agrees with treatment plan.    Final Clinical Impressions(s) / ED Diagnoses   Final diagnoses:  Scrotal swelling  SIRS (systemic inflammatory response syndrome) [facility])    ED Discharge Orders    None       Ward, Ozella Almond, PA-C 08/15/17 1500    Ward, Ozella Almond, PA-C 08/15/17 1605    Pattricia Boss, MD 08/15/17 1714

## 2017-08-15 NOTE — ED Notes (Signed)
I stat lactic acid results given to Dr. Rogene Houston by B. Yolanda Bonine, EMT

## 2017-08-16 ENCOUNTER — Telehealth (INDEPENDENT_AMBULATORY_CARE_PROVIDER_SITE_OTHER): Payer: Self-pay | Admitting: Physician Assistant

## 2017-08-16 ENCOUNTER — Inpatient Hospital Stay (HOSPITAL_COMMUNITY): Payer: Self-pay

## 2017-08-16 DIAGNOSIS — E872 Acidosis: Secondary | ICD-10-CM

## 2017-08-16 LAB — HAPTOGLOBIN: Haptoglobin: 250 mg/dL — ABNORMAL HIGH (ref 34–200)

## 2017-08-16 LAB — URINALYSIS, ROUTINE W REFLEX MICROSCOPIC
BACTERIA UA: NONE SEEN
BILIRUBIN URINE: NEGATIVE
Glucose, UA: NEGATIVE mg/dL
Ketones, ur: NEGATIVE mg/dL
LEUKOCYTES UA: NEGATIVE
NITRITE: NEGATIVE
Protein, ur: 30 mg/dL — AB
RBC / HPF: NONE SEEN RBC/hpf (ref 0–5)
SPECIFIC GRAVITY, URINE: 1.016 (ref 1.005–1.030)
pH: 5 (ref 5.0–8.0)

## 2017-08-16 LAB — CBC
HEMATOCRIT: 41.7 % (ref 39.0–52.0)
Hemoglobin: 13.9 g/dL (ref 13.0–17.0)
MCH: 28.1 pg (ref 26.0–34.0)
MCHC: 33.3 g/dL (ref 30.0–36.0)
MCV: 84.4 fL (ref 78.0–100.0)
Platelets: 128 10*3/uL — ABNORMAL LOW (ref 150–400)
RBC: 4.94 MIL/uL (ref 4.22–5.81)
RDW: 13.7 % (ref 11.5–15.5)
WBC: 20.3 10*3/uL — ABNORMAL HIGH (ref 4.0–10.5)

## 2017-08-16 LAB — GLUCOSE, CAPILLARY
GLUCOSE-CAPILLARY: 188 mg/dL — AB (ref 65–99)
GLUCOSE-CAPILLARY: 210 mg/dL — AB (ref 65–99)
GLUCOSE-CAPILLARY: 196 mg/dL — AB (ref 65–99)
Glucose-Capillary: 162 mg/dL — ABNORMAL HIGH (ref 65–99)

## 2017-08-16 LAB — LACTIC ACID, PLASMA
LACTIC ACID, VENOUS: 2.2 mmol/L — AB (ref 0.5–1.9)
Lactic Acid, Venous: 2.6 mmol/L (ref 0.5–1.9)

## 2017-08-16 LAB — BASIC METABOLIC PANEL
ANION GAP: 21 — AB (ref 5–15)
BUN: 12 mg/dL (ref 6–20)
CHLORIDE: 99 mmol/L — AB (ref 101–111)
CO2: 16 mmol/L — AB (ref 22–32)
Calcium: 9.3 mg/dL (ref 8.9–10.3)
Creatinine, Ser: 1.3 mg/dL — ABNORMAL HIGH (ref 0.61–1.24)
GFR calc non Af Amer: 59 mL/min — ABNORMAL LOW (ref >60)
GLUCOSE: 158 mg/dL — AB (ref 65–99)
Potassium: 4.3 mmol/L (ref 3.5–5.1)
Sodium: 136 mmol/L (ref 135–145)

## 2017-08-16 LAB — SODIUM, URINE, RANDOM: Sodium, Ur: 35 mmol/L

## 2017-08-16 LAB — GC/CHLAMYDIA PROBE AMP (~~LOC~~) NOT AT ARMC
CHLAMYDIA, DNA PROBE: NEGATIVE
Neisseria Gonorrhea: NEGATIVE

## 2017-08-16 LAB — CREATININE, URINE, RANDOM: CREATININE, URINE: 142.74 mg/dL

## 2017-08-16 MED ORDER — LACTATED RINGERS IV BOLUS (SEPSIS)
500.0000 mL | Freq: Once | INTRAVENOUS | Status: AC
  Administered 2017-08-16: 500 mL via INTRAVENOUS

## 2017-08-16 NOTE — Telephone Encounter (Signed)
Pt called to let you know that is in the [facility] since 08/15/17 still there he is in room 3-east-21 if you like to talk to him, he is getting the treatment for his Diabetes, but since he is there since his Scrotal swelling he does not when he be going home...FYI

## 2017-08-16 NOTE — Progress Notes (Signed)
   Subjective: Patient feels that he is doing well today. States that his scrotal pain is improved. States that when he came into the ED his pain was a 10 out of 10 now it is 4 out of 10. He continues to have good urine output and feels like he is able to empty his bladder completely; however, he does endorse weaker stream than usual.  We discussed that we will be monitoring him closely for the next day or so and continuing IV antibiotics. He agrees with the plan. All questions and concerns addressed.  Objective: Vital signs in last 24 hours: Vitals:   08/16/17 0015 08/16/17 0048 08/16/17 0414 08/16/17 0746  BP: 123/68  138/76 (!) 113/57  Pulse: (!) 110  (!) 102 (!) 125  Resp: (!) 22  (!) 22 (!) 22  Temp: (!) 102 F (38.9 C) 100.1 F (37.8 C) 100.1 F (37.8 C) (!) 101.1 F (38.4 C)  TempSrc: Oral Oral Oral Oral  SpO2: 98%  100% 92%  Weight:   247 lb (112 kg)   Height:       General: Obese male in no acute distress Pulm: Good air movement with no wheezing or crackles CV: Regular rate and rhythm, no murmurs, no rubs GU: Scrotal and penile swelling with surrounding erythema. Minimal tenderness to palpation Extremities: Trace lower extremity edema  Assessment/Plan:  Spencer Robles is a 57 year old male with uncontrolled type 2 diabetes mellitus who presented to the emergency department with complaints of progressive scrotal pain and swelling for 2 days duration. Findings are consistent with epididyoorchitis. Further management as outlined below.   Epididymoorchitis - Patient presented with 2 day history of left scrotal swelling and pain - Patient continues to be febrile and have elevated leukocytosis   - Urology has evaluated the patient and recommend IV ceftriaxone pending blood and urine cultures. Appreciate their recommendations. - Pain management with Oxy IR 5 mg and Morphine 4mg  for break through pain   AKI.  - Baseline creatinine of 0.7-0.8, presented with acute elevation  1.21 - Creatinine elevated to 1.30 this AM after 2L NS bolus  - FeNa < 1% indicating pre-renal etiology  - Renal ultrasound pending  High Anion Gap Acidosis with Metabolic alkalosis  - AG increased to 21, sample was not hemolyzed   - No ketones noted on UA, unlikely to be secondary to uremia,  - Lactic acid was elevated yesterday to 3.90, will recheck  - Does not fit with salicylate toxicity   Uncontrolled diabetes mellitus.   - Recent A1c 14.9 - Home regimen includes Lantus 25 units QHS, Novolog 6 units 2 times per day, and metformin 500 mg BID - Hospital regimen Lantus 20 units and SSI resistance  Thrombocytopenia  - Plt count down to 128 this AM - No acute bleeding  - No Anemia or signs of hemolysis with elevated Haptoglobin, normal LDH, however indirect bilirubin is elevated - Continue to monitor  Dispo: Anticipated discharge in approximately >1 day(s).   [name], MD 08/16/2017, 10:02 AM My Pager: [phone]

## 2017-08-16 NOTE — Progress Notes (Signed)
Responded to [facility] to support patient.  Per patient request prayed and provided emotional support, empathetic listening and presence. Patient alert and appears to be please with his progress.  Chaplain will follow as needed. Jaclynn Major, [place], [facility], Pager [phone]

## 2017-08-16 NOTE — Progress Notes (Signed)
In and out cath not done, resistance is felt have way through inserting the cath.

## 2017-08-16 NOTE — Progress Notes (Signed)
CRITICAL VALUE ALERT  Critical Value:  Lactic Acid 2.6  Date & Time Notied:  08/16/16 12nn  Provider Notified: Dr. Tarri Abernethy  Orders Received/Actions taken: none

## 2017-08-16 NOTE — Progress Notes (Signed)
Patient ID: Spencer Robles, male   DOB: 1961/04/05, 57 y.o.   MRN: [id number]    Subjective: Pt subjectively feeling better today.  Pain is better and fairly well controlled with po and IV medication.  He is voiding well but has noted increased edema.  Still with fever overnight and this morning.  Objective: Vital signs in last 24 hours: Temp:  [100.1 F (37.8 C)-103.1 F (39.5 C)] 101.1 F (38.4 C) (01/24 0746) Pulse Rate:  [97-125] 125 (01/24 0746) Resp:  [16-22] 22 (01/24 0746) BP: (97-138)/(49-90) 113/57 (01/24 0746) SpO2:  [92 %-100 %] 92 % (01/24 0746) Weight:  [111.7 kg (246 lb 3.2 oz)-112 kg (247 lb)] 112 kg (247 lb) (01/24 0414)  Intake/Output from previous day: 01/23 0701 - 01/24 0700 In: 2870 [P.O.:720; IV Piggyback:2150] Out: 5732 [Urine:1425] Intake/Output this shift: Total I/O In: 240 [P.O.:240] Out: -   Physical Exam:  General: Alert and oriented GU: Increased penile shaft edema.  Scrotal edema is stable but severe.  Erythema appears slightly improved.  He is less tender on exam today.  I still do not feel any clear areas of fluctuance and see no areas of drainage to suggest abscess formation.  Lab Results: Recent Labs    08/15/17 1055 08/16/17 0407  HGB 13.2 13.9  HCT 38.3* 41.7   CBC Latest Ref Rng & Units 08/16/2017 08/15/2017 07/23/2017  WBC 4.0 - 10.5 K/uL 20.3(H) 18.4(H) 9.9  Hemoglobin 13.0 - 17.0 g/dL 13.9 13.2 14.1  Hematocrit 39.0 - 52.0 % 41.7 38.3(L) 40.1  Platelets 150 - 400 K/uL 128(L) 126(L) 181     BMET Recent Labs    08/15/17 1055 08/16/17 0407  NA 133* 136  K 3.5 4.3  CL 96* 99*  CO2 22 16*  GLUCOSE 154* 158*  BUN 10 12  CREATININE 1.21 1.30*  CALCIUM 9.3 9.3     Studies/Results: US Scrotum  Result Date: 08/15/2017 CLINICAL DATA:  Right testicular swelling for 2 days. EXAM: SCROTAL ULTRASOUND DOPPLER ULTRASOUND OF THE TESTICLES TECHNIQUE: Complete ultrasound examination of the testicles, epididymis, and other scrotal  structures was performed. Color and spectral Doppler ultrasound were also utilized to evaluate blood flow to the testicles. COMPARISON:  None. FINDINGS: Right testicle Measurements: 4.7 x 3.6 x 2.9 cm. Heterogeneous appearance throughout the right testicle. No measurable mass. Left testicle Measurements: 5.3 x 3.4 x 2.8 cm. No mass or microlithiasis visualized. Right epididymis:  Enlarged and heterogeneous Left epididymis:  Enlarged and heterogeneous Hydrocele:  Small right hydrocele Varicocele:  Bilateral varicoceles. Pulsed Doppler interrogation of both testes demonstrates normal low resistance blood flow in both testicles. No hyperemia. IMPRESSION: Abnormal echotexture throughout the right testicle. No associated hyperemia. While this could reflect changes of orchitis, I cannot exclude infiltrating process/mass. Recommend urologic consultation and short-term follow-up. Enlarged, heterogeneous epididymi bilaterally, again without hyperemia. Size and heterogeneous appearance would be consistent with epididymitis, but there is no associated increase blood flow/hyperemia. Small right hydrocele. Bilateral varicoceles. Electronically Signed   By: Rolm Baptise M.D.   On: 08/15/2017 11:55   US Renal  Result Date: 08/16/2017 CLINICAL DATA:  Acute renal injury. EXAM: RENAL / URINARY TRACT ULTRASOUND COMPLETE COMPARISON:  No recent. FINDINGS: Right Kidney: Length: 12.6 cm. Mild increased echogenicity consistent with chronic medical renal disease. No mass or hydronephrosis visualized. Left Kidney: Length: 13.1 cm. Mild increased echogenicity consistent chronic medical renal disease. No mass or hydronephrosis visualized. Bladder: Appears normal for degree of bladder distention. Prostate appears prominent. IMPRESSION: 1. Mild increased renal  echogenicity suggesting chronic medical renal disease. No acute abnormality. No hydronephrosis. 2.  Prostate enlargement. Electronically Signed   By: Marcello Moores  Register   On: 08/16/2017  10:37   [place] Art/ven Flow Abd Pelv Doppler  Result Date: 08/15/2017 CLINICAL DATA:  Right testicular swelling for 2 days. EXAM: SCROTAL ULTRASOUND DOPPLER ULTRASOUND OF THE TESTICLES TECHNIQUE: Complete ultrasound examination of the testicles, epididymis, and other scrotal structures was performed. Color and spectral Doppler ultrasound were also utilized to evaluate blood flow to the testicles. COMPARISON:  None. FINDINGS: Right testicle Measurements: 4.7 x 3.6 x 2.9 cm. Heterogeneous appearance throughout the right testicle. No measurable mass. Left testicle Measurements: 5.3 x 3.4 x 2.8 cm. No mass or microlithiasis visualized. Right epididymis:  Enlarged and heterogeneous Left epididymis:  Enlarged and heterogeneous Hydrocele:  Small right hydrocele Varicocele:  Bilateral varicoceles. Pulsed Doppler interrogation of both testes demonstrates normal low resistance blood flow in both testicles. No hyperemia. IMPRESSION: Abnormal echotexture throughout the right testicle. No associated hyperemia. While this could reflect changes of orchitis, I cannot exclude infiltrating process/mass. Recommend urologic consultation and short-term follow-up. Enlarged, heterogeneous epididymi bilaterally, again without hyperemia. Size and heterogeneous appearance would be consistent with epididymitis, but there is no associated increase blood flow/hyperemia. Small right hydrocele. Bilateral varicoceles. Electronically Signed   By: Rolm Baptise M.D.   On: 08/15/2017 11:55    Assessment/Plan: 1) Epididymo-orchitis:  He has no evidence of abscess formation that would require surgical intervention.  Would continue to proceed with IV antibiotic therapy.  Pain and erythema appear improved which is encouraging.  WBC has increased and he remains febrile but it may be premature to see effects of antibiotic therapy.  Continue with current care and I will continue to follow.  Hopefully, his WBC will start to decreased to tomorrow along  with a decline in his fever curve.     LOS: 1 day   Nader Boys,LES 08/16/2017, 10:51 AM

## 2017-08-17 DIAGNOSIS — N179 Acute kidney failure, unspecified: Secondary | ICD-10-CM

## 2017-08-17 DIAGNOSIS — D696 Thrombocytopenia, unspecified: Secondary | ICD-10-CM

## 2017-08-17 DIAGNOSIS — E118 Type 2 diabetes mellitus with unspecified complications: Secondary | ICD-10-CM

## 2017-08-17 DIAGNOSIS — Z794 Long term (current) use of insulin: Secondary | ICD-10-CM

## 2017-08-17 DIAGNOSIS — E669 Obesity, unspecified: Secondary | ICD-10-CM

## 2017-08-17 LAB — BASIC METABOLIC PANEL
ANION GAP: 13 (ref 5–15)
BUN: 13 mg/dL (ref 6–20)
CHLORIDE: 96 mmol/L — AB (ref 101–111)
CO2: 22 mmol/L (ref 22–32)
Calcium: 8.8 mg/dL — ABNORMAL LOW (ref 8.9–10.3)
Creatinine, Ser: 1.02 mg/dL (ref 0.61–1.24)
GFR calc Af Amer: >60 mL/min (ref >60)
GFR calc non Af Amer: >60 mL/min (ref >60)
GLUCOSE: 186 mg/dL — AB (ref 65–99)
POTASSIUM: 3.3 mmol/L — AB (ref 3.5–5.1)
Sodium: 131 mmol/L — ABNORMAL LOW (ref 135–145)

## 2017-08-17 LAB — GLUCOSE, CAPILLARY
GLUCOSE-CAPILLARY: 169 mg/dL — AB (ref 65–99)
GLUCOSE-CAPILLARY: 166 mg/dL — AB (ref 65–99)
Glucose-Capillary: 171 mg/dL — ABNORMAL HIGH (ref 65–99)
Glucose-Capillary: 160 mg/dL — ABNORMAL HIGH (ref 65–99)

## 2017-08-17 LAB — CBC
HEMATOCRIT: 33.3 % — AB (ref 39.0–52.0)
HEMOGLOBIN: 11.5 g/dL — AB (ref 13.0–17.0)
MCH: 28.3 pg (ref 26.0–34.0)
MCHC: 34.5 g/dL (ref 30.0–36.0)
MCV: 82 fL (ref 78.0–100.0)
Platelets: 88 10*3/uL — ABNORMAL LOW (ref 150–400)
RBC: 4.06 MIL/uL — ABNORMAL LOW (ref 4.22–5.81)
RDW: 13.4 % (ref 11.5–15.5)
WBC: 15.8 10*3/uL — AB (ref 4.0–10.5)

## 2017-08-17 MED ORDER — VANCOMYCIN HCL 10 G IV SOLR
2000.0000 mg | Freq: Once | INTRAVENOUS | Status: AC
  Administered 2017-08-17: 2000 mg via INTRAVENOUS
  Filled 2017-08-17: qty 2000

## 2017-08-17 MED ORDER — CEFEPIME HCL 1 G IJ SOLR
1.0000 g | Freq: Three times a day (TID) | INTRAMUSCULAR | Status: DC
  Administered 2017-08-17 – 2017-08-23 (×18): 1 g via INTRAVENOUS
  Filled 2017-08-17 (×21): qty 1

## 2017-08-17 MED ORDER — VANCOMYCIN HCL IN DEXTROSE 1-5 GM/200ML-% IV SOLN
1000.0000 mg | Freq: Two times a day (BID) | INTRAVENOUS | Status: DC
  Administered 2017-08-18 – 2017-08-20 (×6): 1000 mg via INTRAVENOUS
  Filled 2017-08-17 (×7): qty 200

## 2017-08-17 MED ORDER — POTASSIUM CHLORIDE CRYS ER 20 MEQ PO TBCR
40.0000 meq | EXTENDED_RELEASE_TABLET | Freq: Two times a day (BID) | ORAL | Status: AC
  Administered 2017-08-17 (×2): 40 meq via ORAL
  Filled 2017-08-17 (×2): qty 2

## 2017-08-17 NOTE — Progress Notes (Addendum)
Pharmacy Antibiotic Note  Spencer Robles is a 57 y.o. male admitted on 08/15/2017 with epididymoorchitis/cellulitis that has been slow to respond to IV ceftriaxone.  Pharmacy has been consulted to broaden therapy to cefepime. WBC has trended down to 15.8 and Tmax 24 103. SCr down to 1.02 and CrCl ~ 100 ml/min.   Plan: Cefepime 1 gm every 8 hours  Monitor renal function and clinical improvement   Addendum: Also asked to add vancomycin - will start vancomycin 2 g x 1, then 1g q 12 hrs.  Trough level at steady state as indicated.  Height: 5' 11.5" (181.6 cm) Weight: 247 lb (112 kg)(b scale) IBW/kg (Calculated) : 76.45  Temp (24hrs), Avg:101.3 F (38.5 C), Min:99.1 F (37.3 C), Max:103 F (39.4 C)  Recent Labs  Lab 08/15/17 1055 08/15/17 1309 08/16/17 0407 08/16/17 1042 08/16/17 1227 08/17/17 0615  WBC 18.4*  --  20.3*  --   --  15.8*  CREATININE 1.21  --  1.30*  --   --  1.02  LATICACIDVEN  --  3.90*  --  2.6* 2.2*  --     Estimated Creatinine Clearance: 101.3 mL/min (by C-G formula based on SCr of 1.02 mg/dL).    No Known Allergies  Antimicrobials this admission: 1/23 Ceftriaxone >>>1/25 1/23 Levaquin X 1 1/25 Cefepime >> 1/25 vancomycin >>    Microbiology results: 1/23 BCx: NGTD   Thank you for allowing pharmacy to be a part of this patient's care.  Jimmy Footman, PharmD, BCPS PGY2 Infectious Diseases Pharmacy Resident Pager: [phone]  08/17/2017 4:54 PM

## 2017-08-17 NOTE — Telephone Encounter (Signed)
Tell patient to follow the directions of the doctors that are treating him. I will see him on f/u.

## 2017-08-17 NOTE — Plan of Care (Signed)
  Education: Knowledge of General Education information will improve 08/17/2017 0045 - Progressing by Theador Hawthorne, RN   Activity: Risk for activity intolerance will decrease 08/17/2017 0045 - Progressing by Theador Hawthorne, RN   Pain Managment: General experience of comfort will improve 08/17/2017 0045 - Progressing by Theador Hawthorne, RN   Skin Integrity: Risk for impaired skin integrity will decrease 08/17/2017 0045 - Progressing by Theador Hawthorne, RN

## 2017-08-17 NOTE — Progress Notes (Signed)
Patient ID: Spencer Robles, male   DOB: 03/01/1961, 57 y.o.   MRN: [id number]    Subjective: Pt still with fever this morning to 101.  He has continued to have stable pain requiring IV pain medication over past 24 hrs.  There was an attempt to place a urethral catheter last night for unclear reasons.  This was unsuccessful.  The patient has still been subjectively voiding well.  Objective: Vital signs in last 24 hours: Temp:  [99.1 F (37.3 C)-103 F (39.4 C)] 101.7 F (38.7 C) (01/25 0518) Pulse Rate:  [95-128] 107 (01/25 0518) Resp:  [18-20] 18 (01/25 0518) BP: (103-112)/(61-71) 112/67 (01/25 0518) SpO2:  [94 %-100 %] 100 % (01/25 0518) Weight:  [112 kg (247 lb)] 112 kg (247 lb) (01/25 0527)  Intake/Output from previous day: 01/24 0701 - 01/25 0700 In: 1602.5 [P.O.:360; I.V.:817.5; IV Piggyback:425] Out: 9485 [IOEVO:3500] Intake/Output this shift: No intake/output data recorded.  Physical Exam:  General: Alert and oriented GU: Edema of the penile and scrotal tissues continues to progress.  Erythema remains improved compared to his original presentation.  Still significantly tender to palpation.  No fluctuance or drainage noted.  Lab Results: Recent Labs    08/15/17 1055 08/16/17 0407 08/17/17 0615  HGB 13.2 13.9 11.5*  HCT 38.3* 41.7 33.3*   CBC Latest Ref Rng & Units 08/17/2017 08/16/2017 08/15/2017  WBC 4.0 - 10.5 K/uL 15.8(H) 20.3(H) 18.4(H)  Hemoglobin 13.0 - 17.0 g/dL 11.5(L) 13.9 13.2  Hematocrit 39.0 - 52.0 % 33.3(L) 41.7 38.3(L)  Platelets 150 - 400 K/uL PENDING 128(L) 126(L)     BMET Recent Labs    08/16/17 0407 08/17/17 0615  NA 136 131*  K 4.3 3.3*  CL 99* 96*  CO2 16* 22  GLUCOSE 158* 186*  BUN 12 13  CREATININE 1.30* 1.02  CALCIUM 9.3 8.8*   Blood cultures negative thus far.  Studies/Results: US Scrotum  Result Date: 08/15/2017 CLINICAL DATA:  Right testicular swelling for 2 days. EXAM: SCROTAL ULTRASOUND DOPPLER ULTRASOUND OF THE TESTICLES  TECHNIQUE: Complete ultrasound examination of the testicles, epididymis, and other scrotal structures was performed. Color and spectral Doppler ultrasound were also utilized to evaluate blood flow to the testicles. COMPARISON:  None. FINDINGS: Right testicle Measurements: 4.7 x 3.6 x 2.9 cm. Heterogeneous appearance throughout the right testicle. No measurable mass. Left testicle Measurements: 5.3 x 3.4 x 2.8 cm. No mass or microlithiasis visualized. Right epididymis:  Enlarged and heterogeneous Left epididymis:  Enlarged and heterogeneous Hydrocele:  Small right hydrocele Varicocele:  Bilateral varicoceles. Pulsed Doppler interrogation of both testes demonstrates normal low resistance blood flow in both testicles. No hyperemia. IMPRESSION: Abnormal echotexture throughout the right testicle. No associated hyperemia. While this could reflect changes of orchitis, I cannot exclude infiltrating process/mass. Recommend urologic consultation and short-term follow-up. Enlarged, heterogeneous epididymi bilaterally, again without hyperemia. Size and heterogeneous appearance would be consistent with epididymitis, but there is no associated increase blood flow/hyperemia. Small right hydrocele. Bilateral varicoceles. Electronically Signed   By: Rolm Baptise M.D.   On: 08/15/2017 11:55   US Renal  Result Date: 08/16/2017 CLINICAL DATA:  Acute renal injury. EXAM: RENAL / URINARY TRACT ULTRASOUND COMPLETE COMPARISON:  No recent. FINDINGS: Right Kidney: Length: 12.6 cm. Mild increased echogenicity consistent with chronic medical renal disease. No mass or hydronephrosis visualized. Left Kidney: Length: 13.1 cm. Mild increased echogenicity consistent chronic medical renal disease. No mass or hydronephrosis visualized. Bladder: Appears normal for degree of bladder distention. Prostate appears prominent. IMPRESSION: 1. Mild  increased renal echogenicity suggesting chronic medical renal disease. No acute abnormality. No  hydronephrosis. 2.  Prostate enlargement. Electronically Signed   By: Marcello Moores  Register   On: 08/16/2017 10:37   [place] Art/ven Flow Abd Pelv Doppler  Result Date: 08/15/2017 CLINICAL DATA:  Right testicular swelling for 2 days. EXAM: SCROTAL ULTRASOUND DOPPLER ULTRASOUND OF THE TESTICLES TECHNIQUE: Complete ultrasound examination of the testicles, epididymis, and other scrotal structures was performed. Color and spectral Doppler ultrasound were also utilized to evaluate blood flow to the testicles. COMPARISON:  None. FINDINGS: Right testicle Measurements: 4.7 x 3.6 x 2.9 cm. Heterogeneous appearance throughout the right testicle. No measurable mass. Left testicle Measurements: 5.3 x 3.4 x 2.8 cm. No mass or microlithiasis visualized. Right epididymis:  Enlarged and heterogeneous Left epididymis:  Enlarged and heterogeneous Hydrocele:  Small right hydrocele Varicocele:  Bilateral varicoceles. Pulsed Doppler interrogation of both testes demonstrates normal low resistance blood flow in both testicles. No hyperemia. IMPRESSION: Abnormal echotexture throughout the right testicle. No associated hyperemia. While this could reflect changes of orchitis, I cannot exclude infiltrating process/mass. Recommend urologic consultation and short-term follow-up. Enlarged, heterogeneous epididymi bilaterally, again without hyperemia. Size and heterogeneous appearance would be consistent with epididymitis, but there is no associated increase blood flow/hyperemia. Small right hydrocele. Bilateral varicoceles. Electronically Signed   By: Rolm Baptise M.D.   On: 08/15/2017 11:55    Assessment/Plan: 1) Epididymo-orchitis:  WBC now improving and patient remains otherwise clinically stable.  Despite worsening edema (not unexpected), he still has no exam findings to suggest abscess formation.  Still somewhat concerning he continues to have fever.  Considering that WBC is improving, I would continue IV antibiotics and await blood cultures  results.  Continue to follow WBC and clinical exams.   If he continues to have fever or demonstrates worsening clinical parameters over the weekend, it would be reasonable to consider a CT scan with contrast of the pelvis with extended images through the scrotum and upper thighs.  There is no indication that he requires urethral catheterization.  He is subjectively voiding adequately and his ultrasound from yesterday does not raise concern for bladder distention.   LOS: 2 days   Mishel Sans,LES 08/17/2017, 8:03 AM

## 2017-08-17 NOTE — Progress Notes (Signed)
   Subjective: Patient continues to have swelling this morning. States that yesterday he had an episode of hematuria and clots. He still feels that he is emptying his bladder completely, and states that his stream is not weakened compared to yesterday. His pain continues to be a 4 out of 10 and he feels that it is improving. He continues to be febrile over the interval. We discussed continued treatment with antibiotics. All questions and concerns addressed.  Objective: Vital signs in last 24 hours: Vitals:   08/17/17 0020 08/17/17 0105 08/17/17 0518 08/17/17 0527  BP:   112/67   Pulse:   (!) 107   Resp:   18   Temp: 99.1 F (37.3 C) 99.4 F (37.4 C) (!) 101.7 F (38.7 C)   TempSrc: Oral  Oral   SpO2:   100%   Weight:    247 lb (112 kg)  Height:       General: Obese male in no acute distress Pulm: Good air movement, no wheezing, no crackles CV: Regular rate and rhythm, no murmurs, no rubs Abdomen: Active bowel sounds, soft, nondistended, no tenderness to palpation GU: Patient's penile edema and pelvic edema are increased compared to prior exam.  Assessment/Plan:  Spencer Robles a 57 year old male with uncontrolled type 2 diabetes mellitus who presented to the emergency department with complaints of progressive scrotalpain and swelling for 2 days duration. Findings are consistent with epididyoorchitis. Further management as outlined below.   Epididymoorchitis - Patient presented with 2 day history of left scrotal swelling and pain - Patient continues to be febrile and have elevated leukocytosis; however, leukocytosis has improved compared to prior   - Blood cultures have shown no growth to date - Continue daily PVR - Urology has evaluated the patient and recommend IV ceftriaxone. Appreciate their recommendations. - Pain management with Oxy IR 5 mg and Morphine 4mg  for break through pain   AKI.  - Baseline creatinine of 0.7-0.8, presented with acute elevation1.21 -  Creatinine elevated to 1.02 today   - FeNa < 1% indicating pre-renal etiology  - Continuing fluids  Uncontrolled diabetes mellitus. - Recent A1c 14.9 - Home regimen includes Lantus 25 units QHS, Novolog 6 units 2 times per day, and metformin 500 mg BID - Hospital regimen Lantus 20 units and SSI resistance  Thrombocytopenia  - Plt count down to 88 this AM - Hgb is down to 11.5, will monitor and if it gets worse will recheck hemolysis labs - Continue to monitor  High Anion Gap Acidosis with Metabolic alkalosis. Resolved  Hypokalemia. Replacing   Dispo: Anticipated discharge in approximately >1 day(s).   [name], MD 08/17/2017, 5:49 AM My Pager: [phone]

## 2017-08-17 NOTE — Progress Notes (Signed)
Internal Medicine Attending:   I re-evaluated Mr. Wierzbicki this afternoon and did a bedside POCUS exam of the scrotum. There is profound soft tissue edema throughout the scrotum, penis shaft, and moving up her lower abdomen. But there is no gas and no drainable fluid collections in the dermis today. However, he is not making clinical progress after the first 48 hours of antibiotics; so, we I would like to broaden antibiotics. Given his diabetes he has some risk for pseudomonas, and given recent hospital stay one month ago he has risk for MRSA as well. Plan to change from ceftriaxone to vanc and cefepime please. Will re-evaluate tomorrow and continue to work closely with urology.

## 2017-08-18 ENCOUNTER — Inpatient Hospital Stay (HOSPITAL_COMMUNITY): Payer: Self-pay

## 2017-08-18 ENCOUNTER — Encounter (HOSPITAL_COMMUNITY): Payer: Self-pay | Admitting: Radiology

## 2017-08-18 DIAGNOSIS — R000 Tachycardia, unspecified: Secondary | ICD-10-CM

## 2017-08-18 LAB — CBC
HCT: 31.3 % — ABNORMAL LOW (ref 39.0–52.0)
HEMOGLOBIN: 10.8 g/dL — AB (ref 13.0–17.0)
MCH: 28.2 pg (ref 26.0–34.0)
MCHC: 34.5 g/dL (ref 30.0–36.0)
MCV: 81.7 fL (ref 78.0–100.0)
PLATELETS: 89 10*3/uL — AB (ref 150–400)
RBC: 3.83 MIL/uL — AB (ref 4.22–5.81)
RDW: 13.5 % (ref 11.5–15.5)
WBC: 13.7 10*3/uL — AB (ref 4.0–10.5)

## 2017-08-18 LAB — GLUCOSE, CAPILLARY
GLUCOSE-CAPILLARY: 87 mg/dL (ref 65–99)
GLUCOSE-CAPILLARY: 115 mg/dL — AB (ref 65–99)
Glucose-Capillary: 179 mg/dL — ABNORMAL HIGH (ref 65–99)
Glucose-Capillary: 188 mg/dL — ABNORMAL HIGH (ref 65–99)

## 2017-08-18 LAB — COMPREHENSIVE METABOLIC PANEL
ALBUMIN: 2.5 g/dL — AB (ref 3.5–5.0)
ALK PHOS: 80 U/L (ref 38–126)
ALT: 18 U/L (ref 17–63)
AST: 21 U/L (ref 15–41)
Anion gap: 10 (ref 5–15)
BUN: 11 mg/dL (ref 6–20)
CALCIUM: 8.3 mg/dL — AB (ref 8.9–10.3)
CO2: 22 mmol/L (ref 22–32)
CREATININE: 0.97 mg/dL (ref 0.61–1.24)
Chloride: 98 mmol/L — ABNORMAL LOW (ref 101–111)
GFR calc Af Amer: >60 mL/min (ref >60)
GFR calc non Af Amer: >60 mL/min (ref >60)
GLUCOSE: 163 mg/dL — AB (ref 65–99)
Potassium: 3.6 mmol/L (ref 3.5–5.1)
Sodium: 130 mmol/L — ABNORMAL LOW (ref 135–145)
Total Bilirubin: 1.2 mg/dL (ref 0.3–1.2)
Total Protein: 5.8 g/dL — ABNORMAL LOW (ref 6.5–8.1)

## 2017-08-18 MED ORDER — INSULIN ASPART 100 UNIT/ML ~~LOC~~ SOLN
0.0000 [IU] | Freq: Three times a day (TID) | SUBCUTANEOUS | Status: DC
  Administered 2017-08-18: 4 [IU] via SUBCUTANEOUS
  Administered 2017-08-19 – 2017-08-20 (×2): 3 [IU] via SUBCUTANEOUS
  Administered 2017-08-22: 4 [IU] via SUBCUTANEOUS
  Administered 2017-08-23 – 2017-08-25 (×4): 3 [IU] via SUBCUTANEOUS
  Administered 2017-08-25 – 2017-08-27 (×4): 4 [IU] via SUBCUTANEOUS

## 2017-08-18 MED ORDER — MORPHINE SULFATE (PF) 4 MG/ML IV SOLN
4.0000 mg | INTRAVENOUS | Status: DC | PRN
  Administered 2017-08-18 – 2017-08-20 (×10): 4 mg via INTRAVENOUS
  Filled 2017-08-18 (×10): qty 1

## 2017-08-18 MED ORDER — INSULIN GLARGINE 100 UNIT/ML ~~LOC~~ SOLN
5.0000 [IU] | Freq: Once | SUBCUTANEOUS | Status: AC
  Administered 2017-08-18: 5 [IU] via SUBCUTANEOUS
  Filled 2017-08-18: qty 0.05

## 2017-08-18 MED ORDER — INSULIN GLARGINE 100 UNIT/ML ~~LOC~~ SOLN
30.0000 [IU] | Freq: Every day | SUBCUTANEOUS | Status: DC
  Administered 2017-08-18 – 2017-08-26 (×9): 30 [IU] via SUBCUTANEOUS
  Filled 2017-08-18 (×10): qty 0.3

## 2017-08-18 MED ORDER — IOPAMIDOL (ISOVUE-300) INJECTION 61%
INTRAVENOUS | Status: AC
Start: 2017-08-18 — End: 2017-08-19
  Filled 2017-08-18: qty 100

## 2017-08-18 NOTE — Progress Notes (Signed)
   Subjective: He continues to have pain and swelling, particularly with movement. Still able to empty bladder but is painful. Pain is improved with morphine, but feels it could be better controlled. Discussed broadening antibiotics yesterday and continued close monitoring. All questions and concerns addressed.  Objective: Vital signs in last 24 hours: Vitals:   08/17/17 2104 08/17/17 2319 08/18/17 0346 08/18/17 0626  BP:    129/74  Pulse:    (!) 102  Resp:    20  Temp: (!) 103 F (39.4 C) (!) 102.4 F (39.1 C) (!) 101.6 F (38.7 C) 99.1 F (37.3 C)  TempSrc: Rectal Oral Oral Oral  SpO2:    95%  Weight:    257 lb 8 oz (116.8 kg)  Height:       General: Obese male recumbent in recliner, in no acute distress Pulm: Good air movement, no wheezing, no crackles. Breathing comfortably on RA CV: Tachycardic but otherwise regular. No murmurs, no rubs Abdomen: Active bowel sounds, soft GU: Significant penile, mons pubic and scrotal edema and erythema persists. Tender to palpation with brawny texture. No discrete area of fluctuance appreciated. No open wound or drainage appreciated although area is moist.   Assessment/Plan: Spencer Robles a 57 year old male with uncontrolled type 2 diabetes mellitus who presented to the emergency department 1/23 with complaints of progressive scrotalpain and swelling for 2 days duration. Findings are consistent with epididyoorchitis. Antibiotics were broadened 1/25 to Vancomycin and Cefepime following progressive disease on 48 hours of IV Ceftriaxone.   Epididymoorchitis Patient remains febrile with leukocytosis, WBC overall improving. Fevers still to 103. Edema and erythema of penis, scrotum and mons pubis continued to progress yesterday following 48 hours of IV Ceftriaxone and was subsequently broadened to IV Vancomycin and Cefepime. POCUS yesterday afternoon demonstrated profound soft tissue edema throughout without evidence for gas or drainable fluid  collection.  -Bcx 1/23 NGTD. Repeat Bcx obtained overnight 1/25 as pt febrile to 103.  -Continue daily PVR -Urology on, appreciate their recommendations. -Pain management with Oxy IR 5 mg and have increased Morphine 4mg  Q3H for breakthrough pain  AKI.  Baseline creatinine of 0.7-0.8, presented with acute elevation1.21, now 0.97. FeNa < 1% indicating pre-renal etiology  -DC fluids today as patient has adequate PO intake  Uncontrolled diabetes mellitus. Most recent A1c 07/04/17 14.9%. He is on a modified insulin regimen while in hospital with CBG ranging 160-180. Will work towards better glycemic control today. -Increase Lantus to 30 units QHS and increasing sliding scale to resistant.   Thrombocytopenia  Plt count remains steady at 89 today. Hemoglobin 10.8 from 11.5 yesterday. No overt source of blood loss. Bilirubin normal at 1.2 today, suggesting against hemolysis. LDH 1/23 180. He is on Lovenox however plt count trended from 126 --> 89. Suspect thrombocytopenia related to current infection however will keep close eye on this going forward. -Trend CBC, Bili and if worsening will recheck hemolysis labs  Dispo: Anticipated discharge in approximately >2 day(s).   Spencer Gallina, DO 08/18/2017, 9:14 AM My Pager: [phone]

## 2017-08-18 NOTE — Progress Notes (Signed)
Paged md CT scan results have resulted.

## 2017-08-18 NOTE — Progress Notes (Signed)
Urology Inpatient Progress Report  Scrotal swelling [N50.89] SIRS (systemic inflammatory response syndrome) ([place]) [R65.10]        Intv/Subj: Patient continues to be febrile overnight.  He was febrile to 103.  Leukocytosis has improved a small amount again today.  Scrotum continues to be exquisitely tender and edematous with slight skin changes on the underside of the scrotum.  His main complaint is severe pain of the scrotum though he thinks that this has improved a small amount.  Principal Problem:   Epididymoorchitis Active Problems:   Diabetes mellitus type 2, uncomplicated (HCC)   Great toe amputation status, right (HCC)  Current Facility-Administered Medications  Medication Dose Route Frequency Provider Last Rate Last Dose  . 0.9 %  sodium chloride infusion  250 mL Intravenous PRN Maryellen Pile, MD   Stopped at 08/17/17 0033  . acetaminophen (TYLENOL) tablet 650 mg  650 mg Oral Q6H PRN Maryellen Pile, MD   650 mg at 08/17/17 2227   Or  . acetaminophen (TYLENOL) suppository 650 mg  650 mg Rectal Q6H PRN Maryellen Pile, MD      . ceFEPIme (MAXIPIME) 1 g in dextrose 5 % 50 mL IVPB  1 g Intravenous Q8H Susa Raring, [place]   Stopped at 08/18/17 1240  . docusate sodium (COLACE) capsule 100 mg  100 mg Oral BID Maryellen Pile, MD   100 mg at 08/18/17 0905  . enoxaparin (LOVENOX) injection 40 mg  40 mg Subcutaneous Q24H Maryellen Pile, MD   40 mg at 08/17/17 2227  . insulin aspart (novoLOG) injection 0-20 Units  0-20 Units Subcutaneous TID WC Molt, Bethany, DO   4 Units at 08/18/17 1240  . insulin glargine (LANTUS) injection 30 Units  30 Units Subcutaneous QHS Molt, Bethany, DO      . morphine 4 MG/ML injection 4 mg  4 mg Intravenous Q3H PRN Molt, Bethany, DO   4 mg at 08/18/17 0905  . oxyCODONE (Oxy IR/ROXICODONE) immediate release tablet 5 mg  5 mg Oral Q4H PRN Maryellen Pile, MD   5 mg at 08/18/17 0905  . senna-docusate (Senokot-S) tablet 1 tablet  1 tablet Oral BID Maryellen Pile, MD   1 tablet at 08/18/17 0933  . sodium chloride flush (NS) 0.9 % injection 3 mL  3 mL Intravenous Q12H Maryellen Pile, MD      . sodium chloride flush (NS) 0.9 % injection 3 mL  3 mL Intravenous Q12H Maryellen Pile, MD   3 mL at 08/18/17 0933  . sodium chloride flush (NS) 0.9 % injection 3 mL  3 mL Intravenous PRN Maryellen Pile, MD      . vancomycin (VANCOCIN) IVPB 1000 mg/200 mL premix  1,000 mg Intravenous Q12H Pat Patrick, RPH   Stopped at 08/18/17 1030     Objective: Vital: Vitals:   08/17/17 2104 08/17/17 2319 08/18/17 0346 08/18/17 0626  BP:    129/74  Pulse:    (!) 102  Resp:    20  Temp: (!) 103 F (39.4 C) (!) 102.4 F (39.1 C) (!) 101.6 F (38.7 C) 99.1 F (37.3 C)  TempSrc: Rectal Oral Oral Oral  SpO2:    95%  Weight:    116.8 kg (257 lb 8 oz)  Height:       I/Os: I/O last 3 completed shifts: In: 2855 [P.O.:840; I.V.:1865; IV Piggyback:150] Out: 3295 [Urine:1705]  Physical Exam:  General: Patient is in no apparent distress Lungs: Normal respiratory effort, chest expands symmetrically. GI:The abdomen is soft  and nontender without mass. GU:.  Marked scrotal and penile edema.  Scrotum exquisitely tender to palpation.  There is slight skin changes on the underside of the scrotum along the median raphae but no obvious gangrenous areas.  No obvious crepitus or fluctuance however exam is somewhat limited due to patient discomfort. Ext: lower extremities symmetric  Lab Results: Recent Labs    08/16/17 0407 08/17/17 0615 08/18/17 0050  WBC 20.3* 15.8* 13.7*  HGB 13.9 11.5* 10.8*  HCT 41.7 33.3* 31.3*   Recent Labs    08/16/17 0407 08/17/17 0615 08/18/17 0050  NA 136 131* 130*  K 4.3 3.3* 3.6  CL 99* 96* 98*  CO2 16* 22 22  GLUCOSE 158* 186* 163*  BUN 12 13 11   CREATININE 1.30* 1.02 0.97  CALCIUM 9.3 8.8* 8.3*   No results for input(s): LABPT, INR in the last 72 hours. No results for input(s): LABURIN in the last 72 hours. Results  for orders placed or performed during the hospital encounter of 08/15/17  Culture, blood (routine x 2)     Status: None (Preliminary result)   Collection Time: 08/15/17  1:36 PM  Result Value Ref Range Status   Specimen Description BLOOD LEFT HAND  Final   Special Requests IN PEDIATRIC BOTTLE Blood Culture adequate volume  Final   Culture NO GROWTH 2 DAYS  Final   Report Status PENDING  Incomplete  Culture, blood (routine x 2)     Status: None (Preliminary result)   Collection Time: 08/15/17  1:37 PM  Result Value Ref Range Status   Specimen Description BLOOD RIGHT ANTECUBITAL  Final   Special Requests IN PEDIATRIC BOTTLE Blood Culture adequate volume  Final   Culture NO GROWTH 2 DAYS  Final   Report Status PENDING  Incomplete    Studies/Results: No results found.  Assessment: scrotal cellulitis with scrotal and penile edema and epididymoorchitis   Plan: I ordered a CT of the pelvis with extended images down to the mid thigh with IV contrast to rule out Fournier's gangrene or abscess. Continue broad-spectrum antibiotic coverage   Link Snuffer, MD Urology 08/18/2017, 2:05 PM

## 2017-08-18 NOTE — Plan of Care (Signed)
  Education: Knowledge of General Education information will improve 08/18/2017 0050 - Progressing by Theador Hawthorne, RN   Activity: Risk for activity intolerance will decrease 08/18/2017 0050 - Progressing by Theador Hawthorne, RN   Nutrition: Adequate nutrition will be maintained 08/18/2017 0050 - Progressing by Theador Hawthorne, RN   Elimination: Will not experience complications related to bowel motility 08/18/2017 0050 - Progressing by Theador Hawthorne, RN   Pain Managment: General experience of comfort will improve 08/18/2017 0050 - Progressing by Theador Hawthorne, RN

## 2017-08-19 ENCOUNTER — Inpatient Hospital Stay (HOSPITAL_COMMUNITY): Payer: Self-pay

## 2017-08-19 DIAGNOSIS — I517 Cardiomegaly: Secondary | ICD-10-CM

## 2017-08-19 LAB — COMPREHENSIVE METABOLIC PANEL
ALT: 21 U/L (ref 17–63)
ANION GAP: 12 (ref 5–15)
AST: 25 U/L (ref 15–41)
Albumin: 2.4 g/dL — ABNORMAL LOW (ref 3.5–5.0)
Alkaline Phosphatase: 101 U/L (ref 38–126)
BUN: 9 mg/dL (ref 6–20)
CO2: 24 mmol/L (ref 22–32)
Calcium: 8.5 mg/dL — ABNORMAL LOW (ref 8.9–10.3)
Chloride: 96 mmol/L — ABNORMAL LOW (ref 101–111)
Creatinine, Ser: 0.91 mg/dL (ref 0.61–1.24)
GFR calc Af Amer: >60 mL/min (ref >60)
Glucose, Bld: 125 mg/dL — ABNORMAL HIGH (ref 65–99)
POTASSIUM: 3.2 mmol/L — AB (ref 3.5–5.1)
Sodium: 132 mmol/L — ABNORMAL LOW (ref 135–145)
Total Bilirubin: 1 mg/dL (ref 0.3–1.2)
Total Protein: 6.3 g/dL — ABNORMAL LOW (ref 6.5–8.1)

## 2017-08-19 LAB — CBC WITH DIFFERENTIAL/PLATELET
BASOS ABS: 0 10*3/uL (ref 0.0–0.1)
Band Neutrophils: 0 %
Basophils Relative: 0 %
Blasts: 0 %
EOS PCT: 0 %
Eosinophils Absolute: 0 10*3/uL (ref 0.0–0.7)
HEMATOCRIT: 32.1 % — AB (ref 39.0–52.0)
Hemoglobin: 10.7 g/dL — ABNORMAL LOW (ref 13.0–17.0)
LYMPHS ABS: 0.8 10*3/uL (ref 0.7–4.0)
LYMPHS PCT: 6 %
MCH: 27.7 pg (ref 26.0–34.0)
MCHC: 33.3 g/dL (ref 30.0–36.0)
MCV: 83.2 fL (ref 78.0–100.0)
METAMYELOCYTES PCT: 0 %
MONOS PCT: 5 %
Monocytes Absolute: 0.7 10*3/uL (ref 0.1–1.0)
Myelocytes: 0 %
NEUTROS ABS: 12.1 10*3/uL — AB (ref 1.7–7.7)
NRBC: 0 /100{WBCs}
Neutrophils Relative %: 89 %
OTHER: 0 %
PLATELETS: 106 10*3/uL — AB (ref 150–400)
Promyelocytes Absolute: 0 %
RBC: 3.86 MIL/uL — AB (ref 4.22–5.81)
RDW: 14.4 % (ref 11.5–15.5)
WBC: 13.6 10*3/uL — AB (ref 4.0–10.5)

## 2017-08-19 LAB — GLUCOSE, CAPILLARY
GLUCOSE-CAPILLARY: 141 mg/dL — AB (ref 65–99)
GLUCOSE-CAPILLARY: 132 mg/dL — AB (ref 65–99)
Glucose-Capillary: 121 mg/dL — ABNORMAL HIGH (ref 65–99)
Glucose-Capillary: 223 mg/dL — ABNORMAL HIGH (ref 65–99)

## 2017-08-19 LAB — ECHOCARDIOGRAM COMPLETE
Height: 71.5 in
Weight: 4121.6 oz

## 2017-08-19 MED ORDER — FUROSEMIDE 10 MG/ML IJ SOLN
40.0000 mg | Freq: Once | INTRAMUSCULAR | Status: AC
Start: 2017-08-19 — End: 2017-08-19
  Administered 2017-08-19: 40 mg via INTRAVENOUS
  Filled 2017-08-19: qty 4

## 2017-08-19 MED ORDER — POTASSIUM CHLORIDE CRYS ER 20 MEQ PO TBCR
40.0000 meq | EXTENDED_RELEASE_TABLET | Freq: Two times a day (BID) | ORAL | Status: AC
  Administered 2017-08-19 (×2): 40 meq via ORAL
  Filled 2017-08-19 (×2): qty 2

## 2017-08-19 NOTE — Progress Notes (Addendum)
Pt's temp= 100.6. MD notified per order. RN gave prn acetaminophen. RN will recheck temperature in 1 hour.

## 2017-08-19 NOTE — Progress Notes (Signed)
Pt's temp= 99.8

## 2017-08-19 NOTE — Progress Notes (Signed)
  Echocardiogram 2D Echocardiogram has been performed.  [place]ira Olkowski T Tyleigh Mahn 08/19/2017, 10:06 AM

## 2017-08-19 NOTE — Progress Notes (Signed)
MD is aware of pt's temperature. No new orders.

## 2017-08-19 NOTE — Progress Notes (Addendum)
Urology Inpatient Progress Report  Scrotal swelling [N50.89] SIRS (systemic inflammatory response syndrome) (HCC) [R65.10]        Intv/Subj: No acute events overnight.  Continues to be intermittently febrile.  Leukocytosis continues but is stable.  He underwent a CT scan yesterday which revealed no evidence of subcutaneous gas or abscess.  He continues to have scrotal pain and edema.  He states he is voiding fine but his urine is getting trapped some in his foreskin secondary to edema.  Principal Problem:   Epididymoorchitis Active Problems:   Diabetes mellitus type 2, uncomplicated (HCC)   Great toe amputation status, right (HCC)  Current Facility-Administered Medications  Medication Dose Route Frequency Provider Last Rate Last Dose  . 0.9 %  sodium chloride infusion  250 mL Intravenous PRN Maryellen Pile, MD   Stopped at 08/17/17 0033  . acetaminophen (TYLENOL) tablet 650 mg  650 mg Oral Q6H PRN Maryellen Pile, MD   650 mg at 08/19/17 0131   Or  . acetaminophen (TYLENOL) suppository 650 mg  650 mg Rectal Q6H PRN Maryellen Pile, MD      . ceFEPIme (MAXIPIME) 1 g in dextrose 5 % 50 mL IVPB  1 g Intravenous Q8H Susa Raring, [place]   Stopped at 08/19/17 0645  . docusate sodium (COLACE) capsule 100 mg  100 mg Oral BID Maryellen Pile, MD   100 mg at 08/19/17 0843  . enoxaparin (LOVENOX) injection 40 mg  40 mg Subcutaneous Q24H Maryellen Pile, MD   40 mg at 08/18/17 2250  . insulin aspart (novoLOG) injection 0-20 Units  0-20 Units Subcutaneous TID WC Molt, Bethany, DO   4 Units at 08/18/17 1240  . insulin glargine (LANTUS) injection 30 Units  30 Units Subcutaneous QHS Molt, Bethany, DO   30 Units at 08/18/17 2248  . morphine 4 MG/ML injection 4 mg  4 mg Intravenous Q3H PRN Molt, Bethany, DO   4 mg at 08/19/17 0843  . oxyCODONE (Oxy IR/ROXICODONE) immediate release tablet 5 mg  5 mg Oral Q4H PRN Maryellen Pile, MD   5 mg at 08/19/17 0016  . senna-docusate (Senokot-S) tablet 1  tablet  1 tablet Oral BID Maryellen Pile, MD   1 tablet at 08/19/17 0843  . sodium chloride flush (NS) 0.9 % injection 3 mL  3 mL Intravenous Q12H Maryellen Pile, MD   3 mL at 08/18/17 2256  . sodium chloride flush (NS) 0.9 % injection 3 mL  3 mL Intravenous Q12H Maryellen Pile, MD   3 mL at 08/19/17 0931  . sodium chloride flush (NS) 0.9 % injection 3 mL  3 mL Intravenous PRN Maryellen Pile, MD      . vancomycin (VANCOCIN) IVPB 1000 mg/200 mL premix  1,000 mg Intravenous Q12H Pat Patrick, RPH   Stopped at 08/19/17 0015     Objective: Vital: Vitals:   08/18/17 2010 08/19/17 0120 08/19/17 0331 08/19/17 0515  BP: 136/68   132/70  Pulse: 96   99  Resp: 18   18  Temp: (!) 102.9 F (39.4 C) (!) 103 F (39.4 C) 99.2 F (37.3 C) 98.6 F (37 C)  TempSrc: Oral   Oral  SpO2: 98%   98%  Weight:    116.8 kg (257 lb 9.6 oz)  Height:       I/Os: I/O last 3 completed shifts: In: 2597.5 [P.O.:1200; I.V.:1047.5; IV Piggyback:350] Out: 1350 [Urine:1350]  Physical Exam:  General: Patient is in no apparent distress Lungs: Normal respiratory effort, chest expands  symmetrically. GI: The abdomen is soft and nontender without mass. GU: There is marked scrotal and penile edema scrotum continues to be exquisitely tender to palpation.  Minimal change in exam with a small amount of skin change on the median raphae appears to be 2/2 to edema and pressure but no obvious gangrenous areas.  There is no obvious crepitus or fluctuance. Ext: lower extremities symmetric  Lab Results: Recent Labs    08/17/17 0615 08/18/17 0050 08/19/17 0641  WBC 15.8* 13.7* 13.6*  HGB 11.5* 10.8* 10.7*  HCT 33.3* 31.3* 32.1*   Recent Labs    08/17/17 0615 08/18/17 0050 08/19/17 0641  NA 131* 130* 132*  K 3.3* 3.6 3.2*  CL 96* 98* 96*  CO2 22 22 24   GLUCOSE 186* 163* 125*  BUN 13 11 9   CREATININE 1.02 0.97 0.91  CALCIUM 8.8* 8.3* 8.5*   No results for input(s): LABPT, INR in the last 72 hours. No  results for input(s): LABURIN in the last 72 hours. Results for orders placed or performed during the hospital encounter of 08/15/17  Culture, blood (routine x 2)     Status: None (Preliminary result)   Collection Time: 08/15/17  1:36 PM  Result Value Ref Range Status   Specimen Description BLOOD LEFT HAND  Final   Special Requests IN PEDIATRIC BOTTLE Blood Culture adequate volume  Final   Culture NO GROWTH 3 DAYS  Final   Report Status PENDING  Incomplete  Culture, blood (routine x 2)     Status: None (Preliminary result)   Collection Time: 08/15/17  1:37 PM  Result Value Ref Range Status   Specimen Description BLOOD RIGHT ANTECUBITAL  Final   Special Requests IN PEDIATRIC BOTTLE Blood Culture adequate volume  Final   Culture NO GROWTH 3 DAYS  Final   Report Status PENDING  Incomplete    Studies/Results: Ct Pelvis W Contrast  Result Date: 08/18/2017 CLINICAL DATA:  Scrotal swelling. EXAM: CT PELVIS WITH CONTRAST TECHNIQUE: Multidetector CT imaging of the pelvis was performed using the standard protocol following the bolus administration of intravenous contrast. CONTRAST:  100 cc Isovue 300 COMPARISON:  No comparison studies available. FINDINGS: Urinary Tract:  Bladder unremarkable Bowel:  No bowel dilatation. Vascular/Lymphatic: No evidence for external or common iliac vein occlusion. Borderline lymphadenopathy noted in the external iliac chain and groin regions bilaterally Reproductive: Prostate gland unremarkable. There is extensive scrotal edema with subcutaneous edema tracking up into the suprapubic lower anterior abdominal wall. Minimal edema tracks posteriorly into the perineum. There is no soft tissue gas. No focal or rim enhancing fluid collection to suggest organized abscess Other:  None. Musculoskeletal: Degenerative changes noted in the SI joints bilaterally. IMPRESSION: Marked edema mainly in the scrotum although some subcutaneous edema tracks anteriorly up into the suprapubic lower  anterior abdominal wall. Minimal edema tracking posteriorly into the perineum. Imaging features compatible with cellulitis although third-spacing of fluid into the scrotum could have this appearance. No gas in the soft tissues of the scrotum or perineum to suggest Fournier's gangrene although absence of soft tissue gas does not exclude this etiology. No evidence for drainable abscess. Electronically Signed   By: Misty Stanley M.D.   On: 08/18/2017 16:20    Assessment: Scrotal and penile edema secondary to scrotal cellulitis and epididymoorchitis  Plan: CT was negative for abscess or subcutaneous gas.  Exam is minimally changed today.  There is a small amount of skin change that was present yesterday it is unchanged today but certainly  warrants follow-up exam to ensure that it does not worsen to suggest an underlying necrotic skin infection.  Continue antibiotics and we will continue to follow with exams.   Link Snuffer, MD Urology 08/19/2017, 10:14 AM

## 2017-08-19 NOTE — Progress Notes (Signed)
RN attempted to hang scheduled 1900 antibiotic. Antibiotic bag was leaking. RN notified pharmacy to send replacement bag.

## 2017-08-19 NOTE — Progress Notes (Signed)
   Subjective: Patient continues to have worsening scrotal pain and edema. He states that his pain gets up to a 10/10 with movement or ambulation. He feels that his urine is getting trapped within his skin and that is what all the moisture is. He continues to feel that he is voiding appropriately without any retention. We discussed the results of his CT scan and that his antibiotics have been broadened. All questions and concerns addressed.   Objective: Vital signs in last 24 hours: Vitals:   08/18/17 2010 08/19/17 0120 08/19/17 0331 08/19/17 0515  BP: 136/68   132/70  Pulse: 96   99  Resp: 18   18  Temp: (!) 102.9 F (39.4 C) (!) 103 F (39.4 C) 99.2 F (37.3 C) 98.6 F (37 C)  TempSrc: Oral   Oral  SpO2: 98%   98%  Weight:    257 lb 9.6 oz (116.8 kg)  Height:       General: Obese male  Pulm: Good air movement with no wheezing or crackles  CV: RRR, no murmurs, no rubs Abdomen: Active bowel sounds, soft, non-distended, no tenderness to palpation  GU: Continues to have worsening swelling, diffuse tenderness to palpation, progression of skin discoloration on the scrotum, continues to have pubic and peritoneum swelling, no crepitus or gas felt.   Assessment/Plan:  Spencer Robles a 57 year old male with uncontrolled type 2 diabetes mellitus who presented to the emergency department 1/23 with complaints of progressive scrotalpain and swelling for 2 days duration.Findings are consistent with epididyoorchitis. Antibiotics were broadened on 1/25 to Vancomycin and Cefepime following progressive disease on 48 hours of IV Ceftriaxone.   Epididymoorchitis - Patient remains febrile with leukocytosis, WBC overall improving. Fevers still to 103.  - Edema and erythema of penis, scrotum and mons pubis continued to progress compared to yesterday  - CT abdomen and pelvis with contrast preformed yesterday illustrating marker edema but no subcutaneous gas or abscess noted.  - Repeat BC pending  -  Continue daily PVR - Urology on, appreciate their recommendations. - Pain management with Oxy IR 5 mg and Morphine 4mg  Q3H for breakthrough pain - Antibiotics: Vancomycin and Cefepime   Uncontrolled diabetes mellitus. - Most recent A1c 07/04/17 14.9%.  - Glucose under better control over the interval  - Continue Lantus to 30 units QHS and SSI-resistant.   Thrombocytopenia  - Plt count remains steady at 89 today. Hemoglobin 10.7 - This is likely due to his acute infection  - Repeat pending   AKI. Pre-renal. Resolved Hypokalemia. Replacing   Dispo: Anticipated discharge in approximately >1 day(s).   Spencer Homes, MD 08/19/2017, 5:54 AM My Pager: [phone]

## 2017-08-20 ENCOUNTER — Ambulatory Visit (INDEPENDENT_AMBULATORY_CARE_PROVIDER_SITE_OTHER): Payer: Self-pay | Admitting: Physician Assistant

## 2017-08-20 ENCOUNTER — Ambulatory Visit (INDEPENDENT_AMBULATORY_CARE_PROVIDER_SITE_OTHER): Payer: Self-pay | Admitting: Orthopedic Surgery

## 2017-08-20 LAB — CULTURE, BLOOD (ROUTINE X 2)
CULTURE: NO GROWTH
Culture: NO GROWTH
SPECIAL REQUESTS: ADEQUATE
SPECIAL REQUESTS: ADEQUATE

## 2017-08-20 LAB — CBC
HCT: 31 % — ABNORMAL LOW (ref 39.0–52.0)
Hemoglobin: 10.4 g/dL — ABNORMAL LOW (ref 13.0–17.0)
MCH: 27.6 pg (ref 26.0–34.0)
MCHC: 33.5 g/dL (ref 30.0–36.0)
MCV: 82.2 fL (ref 78.0–100.0)
PLATELETS: 122 10*3/uL — AB (ref 150–400)
RBC: 3.77 MIL/uL — AB (ref 4.22–5.81)
RDW: 14.1 % (ref 11.5–15.5)
WBC: 17.9 10*3/uL — ABNORMAL HIGH (ref 4.0–10.5)

## 2017-08-20 LAB — BASIC METABOLIC PANEL
Anion gap: 12 (ref 5–15)
BUN: 7 mg/dL (ref 6–20)
CHLORIDE: 96 mmol/L — AB (ref 101–111)
CO2: 24 mmol/L (ref 22–32)
CREATININE: 0.82 mg/dL (ref 0.61–1.24)
Calcium: 8.3 mg/dL — ABNORMAL LOW (ref 8.9–10.3)
GFR calc Af Amer: >60 mL/min (ref >60)
Glucose, Bld: 140 mg/dL — ABNORMAL HIGH (ref 65–99)
POTASSIUM: 3.1 mmol/L — AB (ref 3.5–5.1)
SODIUM: 132 mmol/L — AB (ref 135–145)

## 2017-08-20 LAB — VANCOMYCIN, TROUGH: Vancomycin Tr: 7 ug/mL — ABNORMAL LOW (ref 15–20)

## 2017-08-20 LAB — GLUCOSE, CAPILLARY
GLUCOSE-CAPILLARY: 93 mg/dL (ref 65–99)
GLUCOSE-CAPILLARY: 142 mg/dL — AB (ref 65–99)
Glucose-Capillary: 95 mg/dL (ref 65–99)
Glucose-Capillary: 139 mg/dL — ABNORMAL HIGH (ref 65–99)

## 2017-08-20 MED ORDER — MAGNESIUM SULFATE 2 GM/50ML IV SOLN
2.0000 g | Freq: Once | INTRAVENOUS | Status: AC
  Administered 2017-08-20: 2 g via INTRAVENOUS
  Filled 2017-08-20: qty 50

## 2017-08-20 MED ORDER — OXYCODONE HCL 5 MG PO TABS
10.0000 mg | ORAL_TABLET | ORAL | Status: DC | PRN
  Administered 2017-08-20 – 2017-08-27 (×18): 10 mg via ORAL
  Filled 2017-08-20 (×18): qty 2

## 2017-08-20 MED ORDER — VANCOMYCIN HCL 500 MG IV SOLR
500.0000 mg | Freq: Once | INTRAVENOUS | Status: AC
  Administered 2017-08-21: 500 mg via INTRAVENOUS
  Filled 2017-08-20: qty 500

## 2017-08-20 MED ORDER — HYDROMORPHONE HCL 1 MG/ML IJ SOLN
1.0000 mg | INTRAMUSCULAR | Status: DC | PRN
  Administered 2017-08-20 – 2017-08-21 (×3): 1 mg via INTRAVENOUS
  Filled 2017-08-20 (×4): qty 1

## 2017-08-20 MED ORDER — METRONIDAZOLE IN NACL 5-0.79 MG/ML-% IV SOLN
500.0000 mg | Freq: Three times a day (TID) | INTRAVENOUS | Status: DC
  Administered 2017-08-20: 500 mg via INTRAVENOUS
  Filled 2017-08-20: qty 100

## 2017-08-20 MED ORDER — VANCOMYCIN HCL 10 G IV SOLR
1500.0000 mg | Freq: Two times a day (BID) | INTRAVENOUS | Status: DC
  Administered 2017-08-21 – 2017-08-23 (×6): 1500 mg via INTRAVENOUS
  Filled 2017-08-20 (×5): qty 1500

## 2017-08-20 MED ORDER — METRONIDAZOLE 500 MG PO TABS
500.0000 mg | ORAL_TABLET | Freq: Three times a day (TID) | ORAL | Status: DC
  Administered 2017-08-20 – 2017-08-23 (×9): 500 mg via ORAL
  Filled 2017-08-20 (×9): qty 1

## 2017-08-20 MED ORDER — POTASSIUM CHLORIDE CRYS ER 20 MEQ PO TBCR
40.0000 meq | EXTENDED_RELEASE_TABLET | Freq: Two times a day (BID) | ORAL | Status: AC
  Administered 2017-08-20 (×2): 40 meq via ORAL
  Filled 2017-08-20 (×2): qty 2

## 2017-08-20 NOTE — Progress Notes (Addendum)
Patient ID: Spencer Robles, male   DOB: 1961/03/26, 57 y.o.   MRN: [id number]    Subjective: Pt with CT scan over the weekend that did not demonstrate a clear abscess.  However, he has continued being febrile and his WBC increased today.  Pain is not as well controlled.  He last ate 20 minutes ago.  Objective: Vital signs in last 24 hours: Temp:  [97.9 F (36.6 C)-102.1 F (38.9 C)] 99.6 F (37.6 C) (01/28 1848) Pulse Rate:  [83-92] 83 (01/28 1300) Resp:  [17-20] 19 (01/28 1300) BP: (95-130)/(58-72) 125/69 (01/28 1848) SpO2:  [94 %-99 %] 97 % (01/28 1029) Weight:  [116.1 kg (255 lb 14.4 oz)] 116.1 kg (255 lb 14.4 oz) (01/28 0322)  Intake/Output from previous day: 01/27 0701 - 01/28 0700 In: 480 [P.O.:480] Out: 400 [Urine:400] Intake/Output this shift: No intake/output data recorded.  Physical Exam:  General: Alert and oriented GU: Worsening of scrotal edema with skin breakdown now.  More erythematous.  Tender throughout the scrotum.  Still no clear fluctuant area.  Lab Results: Recent Labs    08/18/17 0050 08/19/17 0641 08/20/17 0758  HGB 10.8* 10.7* 10.4*  HCT 31.3* 32.1* 31.0*   CBC Latest Ref Rng & Units 08/20/2017 08/19/2017 08/18/2017  WBC 4.0 - 10.5 K/uL 17.9(H) 13.6(H) 13.7(H)  Hemoglobin 13.0 - 17.0 g/dL 10.4(L) 10.7(L) 10.8(L)  Hematocrit 39.0 - 52.0 % 31.0(L) 32.1(L) 31.3(L)  Platelets 150 - 400 K/uL 122(L) 106(L) 89(L)     BMET Recent Labs    08/19/17 0641 08/20/17 0758  NA 132* 132*  K 3.2* 3.1*  CL 96* 96*  CO2 24 24  GLUCOSE 125* 140*  BUN 9 7  CREATININE 0.91 0.82  CALCIUM 8.5* 8.3*     Studies/Results: No results found.  Assessment/Plan: Probable scrotal abscess: Despite no clear objective findings on imaging or physical exam, the fact his clinical situation has not improved would suggest scrotal abscess.  Will plan for operative exploration tomorrow evening (would otherwise do tonight but patient has recently eaten).  Will proceed with  incision and drainage.  Likely will leave scrotum open with wound care needed postoperatively.  I discussed the potential benefits and risks of the procedure, side effects of the proposed treatment, the likelihood of the patient achieving the goals of the procedure, and any potential problems that might occur during the procedure or recuperation. He gives informed consent. NPO after MN. SSI.   LOS: 5 days   Spencer Robles,LES 08/20/2017, 7:19 PM

## 2017-08-20 NOTE — Progress Notes (Addendum)
Pharmacy Antibiotic Note  Spencer Robles is a 57 y.o. male admitted on 08/15/2017 with epididymoorchitis/cellulitis that has been slow to respond to ceftriaxone. Antibiotics were broadened to vancomycin and cefepime on 1/25. Today is day #4. Continues to have fevers and WBC increasing. Will check vancomycin trough tonight. Renal function stable.  Plan: 1) Continue vancomycin 1g IV q12 - trough tonight prior to 2200 dose 2) Continue cefepime 1g IV q8 3) Order BMET for 1/29  Height: 5' 11.5" (181.6 cm) Weight: 255 lb 14.4 oz (116.1 kg)(b scale) IBW/kg (Calculated) : 76.45  Temp (24hrs), Avg:100.2 F (37.9 C), Min:97.9 F (36.6 C), Max:102.1 F (38.9 C)  Recent Labs  Lab 08/15/17 1055 08/15/17 1309 08/16/17 0407 08/16/17 1042 08/16/17 1227 08/17/17 0615 08/18/17 0050 08/19/17 0641 08/20/17 0758  WBC 18.4*  --  20.3*  --   --  15.8* 13.7* 13.6* 17.9*  CREATININE 1.21  --  1.30*  --   --  1.02 0.97 0.91  --   LATICACIDVEN  --  3.90*  --  2.6* 2.2*  --   --   --   --     Estimated Creatinine Clearance: 115.5 mL/min (by C-G formula based on SCr of 0.91 mg/dL).    No Known Allergies  Antimicrobials this admission: 1/23 Ceftriaxone >>>1/25 1/23 Levaquin X 1 1/25 Cefepime >> 1/25 vancomycin >>    Microbiology results: 1/23 BCx: NGTD 1/26 BCx: NGTD   Thank you for allowing pharmacy to be a part of this patient's care.  Nena Jordan, PharmD, BCPS 08/20/2017 9:42 AM

## 2017-08-20 NOTE — Progress Notes (Signed)
Pharmacy Antibiotic Note  Spencer Robles is a 57 y.o. male admitted on 08/15/2017 with epididymoorchitis/cellulitis that has been slow to respond to ceftriaxone. Antibiotics were broadened to vancomycin and cefepime on 1/25. Today is day #4. Continues to have fevers and WBC increasing. Renal fx remains stable. Vancomycin trough is SUBtherapeutic. The vancomycin 1 g dose was already given tonight, will give another 500 mg tonight to equal 1500 mg.  Plan: -Vancomycin 500 mg x1 then 1500 mg IV q12h -Monitor renal fx, cultures, VT again at steady -Continue cefepime 1 g IV q8h -Continue flagyl 500 mg IV q8h   Height: 5' 11.5" (181.6 cm) Weight: 255 lb 14.4 oz (116.1 kg)(b scale) IBW/kg (Calculated) : 76.45  Temp (24hrs), Avg:99.9 F (37.7 C), Min:97.9 F (36.6 C), Max:101.4 F (38.6 C)  Recent Labs  Lab 08/15/17 1309 08/16/17 0407 08/16/17 1042 08/16/17 1227 08/17/17 0615 08/18/17 0050 08/19/17 0641 08/20/17 0758 08/20/17 2116  WBC  --  20.3*  --   --  15.8* 13.7* 13.6* 17.9*  --   CREATININE  --  1.30*  --   --  1.02 0.97 0.91 0.82  --   LATICACIDVEN 3.90*  --  2.6* 2.2*  --   --   --   --   --   VANCOTROUGH  --   --   --   --   --   --   --   --  7*    Estimated Creatinine Clearance: 128.2 mL/min (by C-G formula based on SCr of 0.82 mg/dL).     Antimicrobials this admission: 1/23 Ceftriaxone >>>1/25 1/23 Levaquin X 1 1/25 Cefepime >> 1/25 vancomycin >>    Microbiology results: 1/23 BCx: NGF 1/26 BCx: NGTD    Spencer Robles, [name] 08/20/2017 11:11 PM

## 2017-08-20 NOTE — Progress Notes (Signed)
   Subjective: She continues to have scrotal pain and edema. States that the pain continues to be worse with movement and ambulation. He does feel like he is still able to empty his bladder completely without issue. He continues to have weeping. Does feel that his pubic edema is worse. Discussed that we will continue antibiotics and continue to monitor. We will perform a POCUS this afternoon. All questions and concerns addressed.   Objective: Vital signs in last 24 hours: Vitals:   08/19/17 1814 08/19/17 2059 08/20/17 0058 08/20/17 0322  BP:  (!) 95/58    Pulse:  92    Resp:  20    Temp: 99.6 F (37.6 C) (!) 102.1 F (38.9 C) 97.9 F (36.6 C)   TempSrc: Oral Oral Oral   SpO2:  99%    Weight:    255 lb 14.4 oz (116.1 kg)  Height:       General: Obese male in no acute distress Pulm: Good air movement with no wheezing or crackles CV: Regular rate and rhythm, no murmurs, no rubs Abdomen: Active bowel sounds, soft, nondistended, suprapubic tenderness and edema GU: Significant prolonged penile edema, advancement of skin discoloration on the scrotum.  No fluctuant masses or crepitus palpated. Extremities: Mild pitting edema bilaterally  Assessment/Plan:  Spencer Robles a 57 year old male with uncontrolled type 2 diabetes mellitus who presented to the emergency department1/23with complaints of progressive scrotalpain and swelling for 2 days duration.Findings are consistent with epididyoorchitis.Antibiotics were broadened on 1/25 to Vancomycin and Cefepime following progressive disease on 48 hours of IV Ceftriaxone.  Epididymoorchitis - Patient remains febrile with leukocytosis, WBC overall improving.  - Edema and erythema of penis, scrotum and mons pubis continues to advance - Repeat BC on 1/26 have shown no growth to date - Continue daily PVR - Urologyon, appreciate their recommendations. - Pain management with Oxy IR 5 mg andMorphine 4mg Q3H for breakthrough pain -  Antibiotics: Vancomycin and Cefepime  - With increasing leukocytosis we will repeat imaging today   Uncontrolled diabetes mellitus. - Most recent A1c 07/04/17 14.9%.  - Glucose under better control over the interval  - Continue Lantus to 30 units QHS and SSI-resistant.  Thrombocytopenia - Plt count improved to 122. Hemoglobin stable  - This is likely due to his acute infection   AKI. Pre-renal. Resolved Hypokalemia. Replacing   Dispo: Anticipated discharge in approximately >1 day(s).   Spencer Homes, MD 08/20/2017, 5:35 AM My Pager: [phone]

## 2017-08-21 ENCOUNTER — Encounter (HOSPITAL_COMMUNITY): Payer: Self-pay | Admitting: Certified Registered"

## 2017-08-21 ENCOUNTER — Inpatient Hospital Stay (HOSPITAL_COMMUNITY): Payer: Self-pay | Admitting: Certified Registered"

## 2017-08-21 ENCOUNTER — Encounter (HOSPITAL_COMMUNITY)
Admission: EM | Disposition: A | Discharge status: Home Health | Payer: Self-pay | Source: Home / Self Care | Attending: Student in an Organized Health Care Education/Training Program

## 2017-08-21 DIAGNOSIS — N454 Abscess of epididymis or testis: Secondary | ICD-10-CM

## 2017-08-21 HISTORY — PX: SCROTAL EXPLORATION: SHX2386

## 2017-08-21 LAB — GLUCOSE, CAPILLARY
GLUCOSE-CAPILLARY: 80 mg/dL (ref 65–99)
GLUCOSE-CAPILLARY: 133 mg/dL — AB (ref 65–99)
Glucose-Capillary: 101 mg/dL — ABNORMAL HIGH (ref 65–99)
Glucose-Capillary: 106 mg/dL — ABNORMAL HIGH (ref 65–99)
Glucose-Capillary: 85 mg/dL (ref 65–99)

## 2017-08-21 LAB — BASIC METABOLIC PANEL
ANION GAP: 11 (ref 5–15)
BUN: 7 mg/dL (ref 6–20)
CALCIUM: 8.1 mg/dL — AB (ref 8.9–10.3)
CO2: 24 mmol/L (ref 22–32)
Chloride: 99 mmol/L — ABNORMAL LOW (ref 101–111)
Creatinine, Ser: 0.78 mg/dL (ref 0.61–1.24)
GFR calc Af Amer: >60 mL/min (ref >60)
GFR calc non Af Amer: >60 mL/min (ref >60)
GLUCOSE: 102 mg/dL — AB (ref 65–99)
POTASSIUM: 3.8 mmol/L (ref 3.5–5.1)
Sodium: 134 mmol/L — ABNORMAL LOW (ref 135–145)

## 2017-08-21 LAB — CBC
HEMATOCRIT: 30.6 % — AB (ref 39.0–52.0)
HEMOGLOBIN: 10.7 g/dL — AB (ref 13.0–17.0)
MCH: 28.1 pg (ref 26.0–34.0)
MCHC: 35 g/dL (ref 30.0–36.0)
MCV: 80.3 fL (ref 78.0–100.0)
Platelets: 139 10*3/uL — ABNORMAL LOW (ref 150–400)
RBC: 3.81 MIL/uL — ABNORMAL LOW (ref 4.22–5.81)
RDW: 14.4 % (ref 11.5–15.5)
WBC: 19.1 10*3/uL — ABNORMAL HIGH (ref 4.0–10.5)

## 2017-08-21 LAB — SURGICAL PCR SCREEN
MRSA, PCR: NEGATIVE
Staphylococcus aureus: NEGATIVE

## 2017-08-21 SURGERY — EXPLORATION, SCROTUM
Anesthesia: General

## 2017-08-21 MED ORDER — MIDAZOLAM HCL 2 MG/2ML IJ SOLN
INTRAMUSCULAR | Status: AC
  Filled 2017-08-21: qty 2

## 2017-08-21 MED ORDER — ONDANSETRON HCL 4 MG/2ML IJ SOLN
INTRAMUSCULAR | Status: DC | PRN
  Administered 2017-08-21: 4 mg via INTRAVENOUS

## 2017-08-21 MED ORDER — ENOXAPARIN SODIUM 60 MG/0.6ML ~~LOC~~ SOLN
60.0000 mg | SUBCUTANEOUS | Status: DC
  Filled 2017-08-21: qty 0.6

## 2017-08-21 MED ORDER — 0.9 % SODIUM CHLORIDE (POUR BTL) OPTIME
TOPICAL | Status: DC | PRN
  Administered 2017-08-21: 1000 mL

## 2017-08-21 MED ORDER — STERILE WATER FOR IRRIGATION IR SOLN
Status: DC | PRN
  Administered 2017-08-21: 1000 mL

## 2017-08-21 MED ORDER — PROPOFOL 10 MG/ML IV BOLUS
INTRAVENOUS | Status: DC | PRN
  Administered 2017-08-21: 150 mg via INTRAVENOUS

## 2017-08-21 MED ORDER — MIDAZOLAM HCL 2 MG/2ML IJ SOLN
1.0000 mg | Freq: Once | INTRAMUSCULAR | Status: AC
Start: 2017-08-21 — End: 2017-08-21
  Administered 2017-08-21: 1 mg via INTRAVENOUS

## 2017-08-21 MED ORDER — LACTATED RINGERS IV SOLN
INTRAVENOUS | Status: DC
  Administered 2017-08-21: 18:00:00 via INTRAVENOUS

## 2017-08-21 MED ORDER — HYDROMORPHONE HCL 1 MG/ML IJ SOLN
INTRAMUSCULAR | Status: AC
  Filled 2017-08-21: qty 1

## 2017-08-21 MED ORDER — POLYMYXIN B SULFATE 500000 UNITS IJ SOLR
INTRAMUSCULAR | Status: DC | PRN
  Administered 2017-08-21: 500 mL

## 2017-08-21 MED ORDER — FENTANYL CITRATE (PF) 100 MCG/2ML IJ SOLN
INTRAMUSCULAR | Status: DC | PRN
  Administered 2017-08-21: 50 ug via INTRAVENOUS
  Administered 2017-08-21: 100 ug via INTRAVENOUS
  Administered 2017-08-21 (×2): 50 ug via INTRAVENOUS

## 2017-08-21 MED ORDER — HYDROMORPHONE HCL 1 MG/ML IJ SOLN
0.2500 mg | INTRAMUSCULAR | Status: DC | PRN
  Administered 2017-08-21 (×4): 0.5 mg via INTRAVENOUS

## 2017-08-21 MED ORDER — MIDAZOLAM HCL 2 MG/2ML IJ SOLN
INTRAMUSCULAR | Status: DC | PRN
  Administered 2017-08-21: 1 mg via INTRAVENOUS

## 2017-08-21 MED ORDER — ROCURONIUM BROMIDE 10 MG/ML (PF) SYRINGE
PREFILLED_SYRINGE | INTRAVENOUS | Status: DC | PRN
  Administered 2017-08-21: 50 mg via INTRAVENOUS

## 2017-08-21 MED ORDER — ONDANSETRON HCL 4 MG/2ML IJ SOLN
INTRAMUSCULAR | Status: AC
  Filled 2017-08-21: qty 2

## 2017-08-21 MED ORDER — BUPIVACAINE HCL (PF) 0.25 % IJ SOLN
INTRAMUSCULAR | Status: AC
  Filled 2017-08-21: qty 30

## 2017-08-21 MED ORDER — LIDOCAINE 2% (20 MG/ML) 5 ML SYRINGE
INTRAMUSCULAR | Status: DC | PRN
  Administered 2017-08-21: 100 mg via INTRAVENOUS

## 2017-08-21 MED ORDER — PROPOFOL 10 MG/ML IV BOLUS
INTRAVENOUS | Status: AC
  Filled 2017-08-21: qty 40

## 2017-08-21 MED ORDER — DEXAMETHASONE SODIUM PHOSPHATE 10 MG/ML IJ SOLN
INTRAMUSCULAR | Status: AC
  Filled 2017-08-21: qty 1

## 2017-08-21 MED ORDER — ROCURONIUM BROMIDE 10 MG/ML (PF) SYRINGE
PREFILLED_SYRINGE | INTRAVENOUS | Status: AC
  Filled 2017-08-21: qty 5

## 2017-08-21 MED ORDER — SODIUM CHLORIDE 0.9 % IR SOLN
Status: DC | PRN
  Administered 2017-08-21: 3000 mL

## 2017-08-21 MED ORDER — HYDROMORPHONE HCL 1 MG/ML IJ SOLN
1.5000 mg | INTRAMUSCULAR | Status: DC | PRN
  Administered 2017-08-21 (×3): 1.5 mg via INTRAVENOUS
  Filled 2017-08-21 (×3): qty 1.5

## 2017-08-21 MED ORDER — SUGAMMADEX SODIUM 200 MG/2ML IV SOLN
INTRAVENOUS | Status: DC | PRN
  Administered 2017-08-21: 200 mg via INTRAVENOUS

## 2017-08-21 MED ORDER — FENTANYL CITRATE (PF) 250 MCG/5ML IJ SOLN
INTRAMUSCULAR | Status: AC
  Filled 2017-08-21: qty 5

## 2017-08-21 MED ORDER — DEXAMETHASONE SODIUM PHOSPHATE 10 MG/ML IJ SOLN
INTRAMUSCULAR | Status: DC | PRN
  Administered 2017-08-21: 4 mg via INTRAVENOUS

## 2017-08-21 SURGICAL SUPPLY — 43 items
BAG DECANTER FOR FLEXI CONT (MISCELLANEOUS) IMPLANT
BLADE 10 SAFETY STRL DISP (BLADE) ×3 IMPLANT
BLADE SURG 15 STRL LF DISP TIS (BLADE) ×1 IMPLANT
BLADE SURG 15 STRL SS (BLADE) ×2
BNDG GAUZE ELAST 4 BULKY (GAUZE/BANDAGES/DRESSINGS) ×3 IMPLANT
BRIEF STRETCH FOR OB PAD LRG (UNDERPADS AND DIAPERS) ×3 IMPLANT
CATH COUDE FOLEY 2W 5CC 16FR (CATHETERS) ×3 IMPLANT
CATH FOLEY 2W COUNCIL 5CC 16FR (CATHETERS) ×3 IMPLANT
COVER SURGICAL LIGHT HANDLE (MISCELLANEOUS) ×3 IMPLANT
DRAPE HALF SHEET 40X57 (DRAPES) IMPLANT
DRAPE LAPAROTOMY T 102X78X121 (DRAPES) IMPLANT
ELECT REM PT RETURN 9FT ADLT (ELECTROSURGICAL) ×3
ELECTRODE REM PT RTRN 9FT ADLT (ELECTROSURGICAL) ×1 IMPLANT
GAUZE SPONGE 4X4 16PLY XRAY LF (GAUZE/BANDAGES/DRESSINGS) IMPLANT
GLOVE BIOGEL M STRL SZ7.5 (GLOVE) ×3 IMPLANT
GOWN STRL REUS W/ TWL LRG LVL3 (GOWN DISPOSABLE) ×2 IMPLANT
GOWN STRL REUS W/TWL LRG LVL3 (GOWN DISPOSABLE) ×4
GUIDEWIRE STR DUAL SENSOR (WIRE) ×3 IMPLANT
HANDPIECE INTERPULSE COAX TIP (DISPOSABLE) ×2
KIT BASIN OR (CUSTOM PROCEDURE TRAY) ×3 IMPLANT
KIT ROOM TURNOVER OR (KITS) ×3 IMPLANT
LEGGING LITHOTOMY PAIR STRL (DRAPES) ×3 IMPLANT
NEEDLE HYPO 25X1 1.5 SAFETY (NEEDLE) IMPLANT
NS IRRIG 1000ML POUR BTL (IV SOLUTION) ×3 IMPLANT
PACK SURGICAL SETUP 50X90 (CUSTOM PROCEDURE TRAY) ×3 IMPLANT
PAD ARMBOARD 7.5X6 YLW CONV (MISCELLANEOUS) ×6 IMPLANT
PENCIL BUTTON HOLSTER BLD 10FT (ELECTRODE) ×3 IMPLANT
SET HNDPC FAN SPRY TIP SCT (DISPOSABLE) ×1 IMPLANT
SET IRRIG Y TYPE TUR BLADDER L (SET/KITS/TRAYS/PACK) ×3 IMPLANT
SPONGE LAP 18X18 X RAY DECT (DISPOSABLE) ×3 IMPLANT
SPONGE LAP 4X18 X RAY DECT (DISPOSABLE) IMPLANT
SUT CHROMIC 3 0 PS 2 (SUTURE) IMPLANT
SUT CHROMIC 3 0 SH 27 (SUTURE) IMPLANT
SUT CHROMIC 4 0 RB 1X27 (SUTURE) IMPLANT
SWAB COLLECTION DEVICE MRSA (MISCELLANEOUS) ×3 IMPLANT
SWAB CULTURE ESWAB REG 1ML (MISCELLANEOUS) ×3 IMPLANT
SYR BULB IRRIGATION 50ML (SYRINGE) ×3 IMPLANT
SYR CONTROL 10ML LL (SYRINGE) IMPLANT
TOWEL OR 17X24 6PK STRL BLUE (TOWEL DISPOSABLE) ×3 IMPLANT
TUBE CONNECTING 12'X1/4 (SUCTIONS) ×1
TUBE CONNECTING 12X1/4 (SUCTIONS) ×2 IMPLANT
WATER STERILE IRR 1000ML POUR (IV SOLUTION) IMPLANT
YANKAUER SUCT BULB TIP NO VENT (SUCTIONS) ×3 IMPLANT

## 2017-08-21 NOTE — Anesthesia Procedure Notes (Signed)
Procedure Name: Intubation Date/Time: 08/21/2017 5:59 PM Performed by: Imagene Riches, CRNA Pre-anesthesia Checklist: Patient identified, Emergency Drugs available, Suction available and Patient being monitored Patient Re-evaluated:Patient Re-evaluated prior to induction Oxygen Delivery Method: Circle System Utilized Preoxygenation: Pre-oxygenation with 100% oxygen Induction Type: IV induction Ventilation: Mask ventilation without difficulty Laryngoscope Size: Mac and 4 Grade View: Grade I Tube type: Oral Tube size: 7.5 mm Number of attempts: 1 Airway Equipment and Method: Stylet and Oral airway Placement Confirmation: ETT inserted through vocal cords under direct vision,  positive ETCO2 and breath sounds checked- equal and bilateral Secured at: 23 cm Tube secured with: Tape Dental Injury: Teeth and Oropharynx as per pre-operative assessment

## 2017-08-21 NOTE — Anesthesia Preprocedure Evaluation (Addendum)
Anesthesia Evaluation  Patient identified by MRN, date of birth, ID band Patient awake    Reviewed: Allergy & Precautions, H&P , NPO status , Patient's Chart, lab work & pertinent test results  Airway Mallampati: II  TM Distance: >3 FB Neck ROM: Full    Dental no notable dental hx. (+) Teeth Intact, Dental Advisory Given   Pulmonary sleep apnea ,    Pulmonary exam normal breath sounds clear to auscultation       Cardiovascular negative cardio ROS   Rhythm:Regular Rate:Normal     Neuro/Psych negative neurological ROS  negative psych ROS   GI/Hepatic negative GI ROS, Neg liver ROS,   Endo/Other  diabetes, Insulin Dependent, Oral Hypoglycemic Agents  Renal/GU negative Renal ROS  negative genitourinary   Musculoskeletal   Abdominal   Peds  Hematology negative hematology ROS (+)   Anesthesia Other Findings   Reproductive/Obstetrics negative OB ROS                            Anesthesia Physical Anesthesia Plan  ASA: III  Anesthesia Plan: General   Post-op Pain Management:    Induction: Intravenous  PONV Risk Score and Plan: 3 and Ondansetron and Midazolam  Airway Management Planned: Oral ETT and LMA  Additional Equipment:   Intra-op Plan:   Post-operative Plan: Extubation in OR  Informed Consent: I have reviewed the patients History and Physical, chart, labs and discussed the procedure including the risks, benefits and alternatives for the proposed anesthesia with the patient or authorized representative who has indicated his/her understanding and acceptance.   Dental advisory given  Plan Discussed with: CRNA  Anesthesia Plan Comments:         Anesthesia Quick Evaluation

## 2017-08-21 NOTE — Op Note (Signed)
Preoperative diagnosis: Probable scrotal abscess  Postoperative diagnosis: Probable scrotal abscess, difficult urethral catheterization  Procedures: 1.  Incision and drainage of scrotal abscess 2.  Flexible cystoscopy with complex urethral catheter placement  Surgeon: Pryor Curia MD  Anesthesia: General  Complications: None  EBL: Minimal  Specimens: Aerobic and anaerobic cultures sent from scrotal wound to microbiology  Indication: Spencer Robles is a 57 year old gentleman who presented last week with presumed bilateral epididymal orchitis.  Scrotal ultrasonography did not demonstrate evidence of a scrotal abscess and his exam was not consistent with a scrotal abscess at that time.  He was admitted on IV antibiotics and improved marginally with a decreased leukocytosis.  However, he had persistent fevers.  After 72 hours, he underwent repeat imaging with a CT scan that did not demonstrate any obvious fluid collection or abscess.  His clinical situation continued to worsen and it was recommended that he proceed with operative exploration and incision and drainage.  The potential risks, complications, and the expected recovery process associated with the above procedures were discussed in detail.  Informed consent was obtained.   Description of procedure: The patient was taken to the operating room and a general anesthetic was administered.  He was given preoperative antibiotics, placed in the dorsolithotomy position, and prepped and draped in the usual sterile fashion.  Next, a preoperative timeout was performed.  The patient's scrotum was then examined.  It was severely edematous with superficial skin breakdown.  However, no clear fluctuant area could be identified.  Therefore, I made an incision in the midline of the scrotum extending posteriorly toward the perineum.  Digital exploration of the scrotum revealed no large pockets of purulent material.  Aerobic and anaerobic cultures were  obtained.  There appeared to be diffuse and severe edema throughout the scrotum.  No obvious purulent pockets were able to be identified despite extensive exploration.  The wound was then pulse lavaged with saline.  I then used polymyxin antibiotic irrigation.  A wet-to-dry Kerlix dressing was placed and the wound was left open.  Considering the large wound, I did think it would be best to place a Foley catheter so that he would not need to ambulate for the next 24-48 hours if he was in significant pain.  An attempt to place both a 51 [place] Foley and a 71 [place] coud catheter was unsuccessful.  I therefore inserted a 16 French flexible cystoscope and was able to enter the bladder without significant difficulty.  As a precaution, I placed a 0.38 Sensor guidewire and advanced a 16 [place] [employer] tip catheter over the wire and into the bladder.  I will plan to remove this in the next 48 hours for a voiding trial if his pain and clinical situation is improved.  He tolerated the procedure well without  complications.  He was able to be extubated and transferred to the recovery unit in satisfactory condition.

## 2017-08-21 NOTE — Progress Notes (Signed)
Cefepime due @1900  not given.Patient off unit for surgery.

## 2017-08-21 NOTE — Progress Notes (Signed)
   Subjective: Patient is in extreme pain this morning. His pain continues to worsen. He states that urology told him they would take him for surgery today. We discussed that we will otherwise continue his antibiotics and work on pain control. He denies urinary obstructive symptoms. All questions and concerns addressed.   Objective: Vital signs in last 24 hours: Vitals:   08/20/17 1300 08/20/17 1516 08/20/17 1848 08/20/17 2110  BP: 130/72  125/69 121/73  Pulse: 83   88  Resp: 19   17  Temp:  (!) 101.4 F (38.6 C) 99.6 F (37.6 C) 99.4 F (37.4 C)  TempSrc: Oral Oral Oral Oral  SpO2:    94%  Weight:      Height:       General: Obese male  Pulm: Good air movement with no wheezing or crackles  CV: RRR, no murmurs, no rubs  Abdomen: Active bowel sounds, soft, non-distended, no tenderness to palpation  GU: Continued swelling and erythema of the scrotum, penis, and mons pubis. Diffuse tenderness to palpation  Extremities: Mild pitting edema bilaterally    Assessment/Plan:  Orva Gwaltney a 57 year old male with uncontrolled type 2 diabetes mellitus who presented to the emergency department1/23with complaints of progressive scrotalpain and swelling for 2 days duration.Findings are consistent with epididyoorchitis.Patient continues to have worsening edema and erythema. POCUS yesterday illustrating possible scrotal abscess. Urology planning for I&D today. Currently on Vanc, Cefepime, and Metronidazole. Further management as outlined below.   Epididymoorchitis -Patient remains febrile with worsening leukocytosis.  -Edema and erythema of penis, scrotum and mons pubis continues to advance -Repeat BC on 1/26 have shown no growth to date - Continue daily PVR - Pain management with Dilaudid 1 mg every 2 hours and Oxycodone 10 mg every 4 hours.  - Continue antibiotics Vanc, Cefepime, and Metronidazole  - POCUS yesterday illustrating possible scrotal abscess. Urologyplanning to  taking the patient for I&D today.  - If pain is uncontrolled after surgery will start PCA pump  Uncontrolled diabetes mellitus. -Most recent A1c 07/04/17 14.9%. - Glucose under better control over the interval -ContinueLantus to 30 units QHS and SSI-resistant.  Thrombocytopenia -Plt count improved to 139. Hemoglobin stable  -This is likely due to his acute infection   AKI.Pre-renal. Resolved Hypokalemia.Resolved  Dispo: Anticipated discharge in approximately >1 day(s).   [name], MD 08/21/2017, 5:45 AM My Pager: [phone]

## 2017-08-21 NOTE — Progress Notes (Signed)
Patient ID: Spencer Robles, male   DOB: April 26, 1961, 57 y.o.   MRN: [id number]  Day of Surgery Subjective: Pt with continued fever and increasing WBC.  Objective: Vital signs in last 24 hours: Temp:  [99.4 F (37.4 C)-100.6 F (38.1 C)] 100.5 F (38.1 C) (01/29 1418) Pulse Rate:  [88-94] 88 (01/29 1123) Resp:  [17-18] 18 (01/29 1123) BP: (121-140)/(69-76) 132/70 (01/29 1123) SpO2:  [91 %-94 %] 94 % (01/29 1123) Weight:  [114.3 kg (251 lb 14.4 oz)] 114.3 kg (251 lb 14.4 oz) (01/29 0651)  Intake/Output from previous day: 01/28 0701 - 01/29 0700 In: 1990 [P.O.:840; IV Piggyback:1150] Out: 100 [Urine:100] Intake/Output this shift: Total I/O In: 100 [IV Piggyback:100] Out: 200 [Urine:200]  Physical Exam:  General: Alert and oriented GU: Exam unchanged and remains concerning for scrotal abscess  Lab Results: Recent Labs    08/19/17 0641 08/20/17 0758 08/21/17 0526  HGB 10.7* 10.4* 10.7*  HCT 32.1* 31.0* 30.6*   CBC Latest Ref Rng & Units 08/21/2017 08/20/2017 08/19/2017  WBC 4.0 - 10.5 K/uL 19.1(H) 17.9(H) 13.6(H)  Hemoglobin 13.0 - 17.0 g/dL 10.7(L) 10.4(L) 10.7(L)  Hematocrit 39.0 - 52.0 % 30.6(L) 31.0(L) 32.1(L)  Platelets 150 - 400 K/uL 139(L) 122(L) 106(L)     BMET Recent Labs    08/20/17 0758 08/21/17 0526  NA 132* 134*  K 3.1* 3.8  CL 96* 99*  CO2 24 24  GLUCOSE 140* 102*  BUN 7 7  CREATININE 0.82 0.78  CALCIUM 8.3* 8.1*     Studies/Results: No results found.  Assessment/Plan: Probable scrotal abscess - Plan for OR for I&D today.     LOS: 6 days   Kenard Morawski,LES 08/21/2017, 5:36 PM

## 2017-08-21 NOTE — Transfer of Care (Signed)
Immediate Anesthesia Transfer of Care Note  Patient: Spencer Robles  Procedure(s) Performed: INCISION AND DRAINAGE SCROTAL ABSCESS, CYSTOSCOPY, COMPLEX INSERTION OF URETHERAL FOLEY CATHETER (N/A )  Patient Location: PACU  Anesthesia Type:General  Level of Consciousness: awake, alert  and oriented  Airway & Oxygen Therapy: Patient Spontanous Breathing and Patient connected to nasal cannula oxygen  Post-op Assessment: Report given to RN and Post -op Vital signs reviewed and stable  Post vital signs: Reviewed and stable  Last Vitals:  Vitals:   08/21/17 1123 08/21/17 1418  BP: 132/70   Pulse: 88   Resp: 18   Temp: (!) 38.1 C (!) 38.1 C  SpO2: 94%     Last Pain:  Vitals:   08/21/17 1500  TempSrc:   PainSc: 10-Worst pain ever      Patients Stated Pain Goal: 2 (53/29/92 4268)  Complications: No apparent anesthesia complications

## 2017-08-21 NOTE — Plan of Care (Signed)
  Clinical Measurements: Ability to maintain clinical measurements within normal limits will improve 08/21/2017 0142 - Progressing by Dala Breault A, RN   Nutrition: Adequate nutrition will be maintained 08/21/2017 0142 - Progressing by Skarleth Delmonico, Roma Kayser, RN

## 2017-08-22 ENCOUNTER — Encounter (HOSPITAL_COMMUNITY): Payer: Self-pay | Admitting: Urology

## 2017-08-22 DIAGNOSIS — Z960 Presence of urogenital implants: Secondary | ICD-10-CM

## 2017-08-22 LAB — CBC
HEMATOCRIT: 30.4 % — AB (ref 39.0–52.0)
HEMOGLOBIN: 10.3 g/dL — AB (ref 13.0–17.0)
MCH: 27.9 pg (ref 26.0–34.0)
MCHC: 33.9 g/dL (ref 30.0–36.0)
MCV: 82.4 fL (ref 78.0–100.0)
Platelets: 255 10*3/uL (ref 150–400)
RBC: 3.69 MIL/uL — ABNORMAL LOW (ref 4.22–5.81)
RDW: 14.3 % (ref 11.5–15.5)
WBC: 28.5 10*3/uL — ABNORMAL HIGH (ref 4.0–10.5)

## 2017-08-22 LAB — BASIC METABOLIC PANEL
Anion gap: 12 (ref 5–15)
BUN: 8 mg/dL (ref 6–20)
CHLORIDE: 98 mmol/L — AB (ref 101–111)
CO2: 24 mmol/L (ref 22–32)
CREATININE: 0.77 mg/dL (ref 0.61–1.24)
Calcium: 8.2 mg/dL — ABNORMAL LOW (ref 8.9–10.3)
GFR calc non Af Amer: >60 mL/min (ref >60)
GLUCOSE: 109 mg/dL — AB (ref 65–99)
Potassium: 3.7 mmol/L (ref 3.5–5.1)
Sodium: 134 mmol/L — ABNORMAL LOW (ref 135–145)

## 2017-08-22 LAB — GLUCOSE, CAPILLARY
GLUCOSE-CAPILLARY: 134 mg/dL — AB (ref 65–99)
GLUCOSE-CAPILLARY: 183 mg/dL — AB (ref 65–99)
GLUCOSE-CAPILLARY: 124 mg/dL — AB (ref 65–99)
Glucose-Capillary: 90 mg/dL (ref 65–99)

## 2017-08-22 MED ORDER — OXYBUTYNIN CHLORIDE 5 MG PO TABS
5.0000 mg | ORAL_TABLET | Freq: Three times a day (TID) | ORAL | Status: DC | PRN
  Administered 2017-08-22: 5 mg via ORAL
  Filled 2017-08-22: qty 1

## 2017-08-22 MED ORDER — DIPHENHYDRAMINE HCL 50 MG/ML IJ SOLN: 12.5000 mg | Freq: Four times a day (QID) | INTRAMUSCULAR | Status: DC | PRN

## 2017-08-22 MED ORDER — HYDROMORPHONE HCL 1 MG/ML IJ SOLN
1.5000 mg | INTRAMUSCULAR | Status: DC | PRN
  Administered 2017-08-23 – 2017-08-25 (×2): 1.5 mg via INTRAVENOUS
  Filled 2017-08-22 (×2): qty 1.5

## 2017-08-22 MED ORDER — ENOXAPARIN SODIUM 60 MG/0.6ML ~~LOC~~ SOLN
60.0000 mg | SUBCUTANEOUS | Status: DC
  Administered 2017-08-22 – 2017-08-26 (×5): 60 mg via SUBCUTANEOUS
  Filled 2017-08-22 (×5): qty 0.6

## 2017-08-22 MED ORDER — ONDANSETRON HCL 4 MG/2ML IJ SOLN: 4.0000 mg | Freq: Four times a day (QID) | INTRAMUSCULAR | Status: DC | PRN

## 2017-08-22 MED ORDER — NALOXONE HCL 0.4 MG/ML IJ SOLN: 0.4000 mg | INTRAMUSCULAR | Status: DC | PRN

## 2017-08-22 MED ORDER — DIPHENHYDRAMINE HCL 12.5 MG/5ML PO ELIX: 12.5000 mg | ORAL_SOLUTION | Freq: Four times a day (QID) | ORAL | Status: DC | PRN

## 2017-08-22 MED ORDER — HYDROMORPHONE 1 MG/ML IV SOLN
INTRAVENOUS | Status: DC
  Filled 2017-08-22: qty 25

## 2017-08-22 MED ORDER — SODIUM CHLORIDE 0.9% FLUSH: 9.0000 mL | INTRAVENOUS | Status: DC | PRN

## 2017-08-22 NOTE — Consult Note (Signed)
WOC consulted for wound care, however after review of the chart this patient underwent surgical procedure yesterday per urology with catheter placement and I&D. Would suggest to not change any wound care and /or dressings until given direction from the urologist on the care they would prefer.  If saline moist gauze packing or packing strip, beside nurses are ok to proceed with dressing changes per urology orders.    Re consult if needed, will not follow at this time. Thanks  Jamaurie Bernier [employer], RN,CWOCN, CNS, [place] [phone])

## 2017-08-22 NOTE — Progress Notes (Signed)
   Subjective: Patient's pain is improved this AM but still a 4/10. He is concerned that his foley is coming out. We discussed that there is a balloon on the end of the catheter that prevents it from coming out. He continues to express concern. We discussed that we will continue to work on pain control, continue antibiotics, and consult wound care for recommendation. All questions and concerns addressed.   Objective: Vital signs in last 24 hours: Vitals:   08/21/17 2000 08/21/17 2015 08/21/17 2042 08/22/17 0124  BP: (!) 156/83 (!) 154/94 139/78 (P) 116/80  Pulse: (!) 105 (!) 106 (!) 103 (P) 89  Resp: (!) 26 (!) 27 (!) 22 (P) 20  Temp:  99.9 F (37.7 C) (!) 100.8 F (38.2 C) (P) 99.1 F (37.3 C)  TempSrc:   Oral (P) Oral  SpO2: 94% 94% 100% (P) 94%  Weight:      Height:       General: Obese male  Pulm: Good air movement with no wheezing or crackles  CV: RRR, no murmurs, no rubs  GU: Scrotal/penile edema and erythema have improved compared to days prior, packed laceration on the scrotum   Assessment/Plan:  Louise Victory a 57 year old male with uncontrolled type 2 diabetes mellitus who presented to the emergency departmenton 1/23with complaints of progressive scrotalpain and swelling for 2 days duration.Findings are consistent with epididyoorchitis.Patient was taken for I&D yesterday with no obvious abscess identified. Currently on Vanc, Cefepime, and Metronidazole. Further management as outlined below.   Epididymoorchitis / Cellulitis  -Patient remains febrile with worsening leukocytosis, up to 28.5 this AM  - Went for I&D yesterday evening with no obvious abscess identified. Foley catheter placed for the next 24-48 hours.  - Wound cultures obtained  - Pain management with Dilaudid 1.5 mg every 2 hours and Oxycodone IR 10 mg every 4 hours  - Continue Vanc, Cefepime, and Metronidazole  - Appreciate urology's help. - If pain becomes uncontrolled, will transfer so that the  patient may use a PCA pump - Have consulted wound care   Uncontrolled diabetes mellitus. -Most recent A1c 07/04/17 14.9%. - Glucose under better control over the interval -ContinueLantus to 30 units QHS and SSI-resistant.  Thrombocytopenia. Resolved AKI.Pre-renal. Resolved Hypokalemia.Resolved  Dispo: Anticipated discharge in approximately >1 day(s).   [name], MD 08/22/2017, 5:39 AM My Pager: [phone]

## 2017-08-22 NOTE — Progress Notes (Signed)
Page to last MD who has documented seeing the patient, as orders were placed by physician whos contact information cannot be located at this time.

## 2017-08-22 NOTE — Progress Notes (Signed)
Call placed to urology on call and internal medicine. Urology advised that patient has c/o feeling like the catheter is coming out. Output noted in tubing; per urology catheter is in place.  Internal medicine informed of 3 east scope of care eliminating PCA use except for end-of-life care per department director. They report rounding in the next hour to visit pt and readdress the need for PCA order placed this morning.   Oxybutynin given to pt for bladder spasms, pt re-assured that draining catheter is a good sign and that the medication will help alleviate the sensation of the catheter trying to "jump out".

## 2017-08-22 NOTE — Plan of Care (Signed)
  Nutrition: Adequate nutrition will be maintained 08/22/2017 0540 - Progressing by Mayford Alberg A, RN   Coping: Level of anxiety will decrease 08/22/2017 0540 - Progressing by Harce Volden, Roma Kayser, RN

## 2017-08-22 NOTE — Anesthesia Postprocedure Evaluation (Signed)
Anesthesia Post Note  Patient: Spencer Robles  Procedure(s) Performed: INCISION AND DRAINAGE SCROTAL ABSCESS, CYSTOSCOPY, COMPLEX INSERTION OF URETHERAL FOLEY CATHETER (N/A )     Patient location during evaluation: PACU Anesthesia Type: General Level of consciousness: awake and alert Pain management: pain level controlled Vital Signs Assessment: post-procedure vital signs reviewed and stable Respiratory status: spontaneous breathing, nonlabored ventilation and respiratory function stable Cardiovascular status: blood pressure returned to baseline and stable Postop Assessment: no apparent nausea or vomiting Anesthetic complications: no    Last Vitals:  Vitals:   08/22/17 0124 08/22/17 0638  BP: 116/80 133/71  Pulse: 89 78  Resp: 20 18  Temp: 37.3 C 37.5 C  SpO2: 94% 94%    Last Pain:  Vitals:   08/22/17 2094  TempSrc: Oral  PainSc:                  Catalina Gravel

## 2017-08-22 NOTE — Progress Notes (Signed)
Patient ID: Spencer Robles, male   DOB: 03/19/1961, 57 y.o.   MRN: [id number]  1 Day Post-Op Subjective: Pt s/p I & D of scrotum last night.  There was no clear purulent area to drain but just generalized edema with very minimal purulence.  Cultures were obtained.  Fever curve improved today. He reports that his scrotum feels improved but complains of bladder spasm symptoms.  Objective: Vital signs in last 24 hours: Temp:  [99.1 F (37.3 C)-100.8 F (38.2 C)] 99.5 F (37.5 C) (01/30 3893) Pulse Rate:  [78-106] 78 (01/30 0638) Resp:  [18-27] 18 (01/30 0638) BP: (116-156)/(71-94) 133/71 (01/30 0638) SpO2:  [94 %-100 %] 94 % (01/30 7342) Weight:  [113.4 kg (250 lb 0 oz)] 113.4 kg (250 lb 0 oz) (01/30 8768)  Intake/Output from previous day: 01/29 0701 - 01/30 0700 In: 1200 [P.O.:600; I.V.:500; IV Piggyback:100] Out: 2260 [Urine:2250; Blood:10] Intake/Output this shift: No intake/output data recorded.  Physical Exam:  General: Alert and oriented GU: Urine clear, Erythema over lower abdomen improved.  Still with severe edema of the scrotum and penile skin.  Progressive skin breakdown on ventral penile and anterior scrotum skin.  Dressing intact but has not been changed in 24 hours despite order after surgery.  Minimal tenderness around incision.   Lab Results: Recent Labs    08/20/17 0758 08/21/17 0526 08/22/17 0500  HGB 10.4* 10.7* 10.3*  HCT 31.0* 30.6* 30.4*   CBC Latest Ref Rng & Units 08/22/2017 08/21/2017 08/20/2017  WBC 4.0 - 10.5 K/uL 28.5(H) 19.1(H) 17.9(H)  Hemoglobin 13.0 - 17.0 g/dL 10.3(L) 10.7(L) 10.4(L)  Hematocrit 39.0 - 52.0 % 30.4(L) 30.6(L) 31.0(L)  Platelets 150 - 400 K/uL 255 139(L) 122(L)     BMET Recent Labs    08/21/17 0526 08/22/17 0500  NA 134* 134*  K 3.8 3.7  CL 99* 98*  CO2 24 24  GLUCOSE 102* 109*  BUN 7 8  CREATININE 0.78 0.77  CALCIUM 8.1* 8.2*     Studies/Results: No results found.  Assessment/Plan: Probable scrotal abscess - I  & D very unsatisfying last night in that no large purulent drainage noted.  However, erythema improved and fever curve improved today.  Increased WBC this morning consistent with postoperative elevation and would expect to improve tomorrow.  I will talk with nursing staff re: wet to dry dressing changes q shift.  Continue antibiotics pending final cultures.  If cultures prove to be negative, may benefit from advice from ID for ongoing antibiotic therapy.  I would recommend beginning process of talking with case management about possible discharge plans for SNF vs home with home health for outpatient dressing changes and wound care.    - Will d/c catheter as patient is easily able to ambulate and catheter has been irritating to him.    LOS: 7 days   Spencer Robles,LES 08/22/2017, 7:49 PM

## 2017-08-22 NOTE — Progress Notes (Addendum)
Patient has now had a second iv access "come out" of his arm/hand today. Pt is demanding to see urology, page has been sent to Dr McDiarmid.   Per Dr McDiarmid, Dr Alinda Money will be rounding on patient tonight.   IV team order placed, for yet another IV.   Pt informed of urology rounding planned for this evening

## 2017-08-22 NOTE — Progress Notes (Signed)
PCA orders canceled by MD, if IV/PO pain medications are not handling the pts pain, call back to teaching services/MD and request transfer/PCA pump orders.

## 2017-08-23 DIAGNOSIS — Z89411 Acquired absence of right great toe: Secondary | ICD-10-CM

## 2017-08-23 DIAGNOSIS — N453 Epididymo-orchitis: Principal | ICD-10-CM

## 2017-08-23 LAB — GLUCOSE, CAPILLARY
GLUCOSE-CAPILLARY: 122 mg/dL — AB (ref 65–99)
GLUCOSE-CAPILLARY: 70 mg/dL (ref 65–99)
GLUCOSE-CAPILLARY: 129 mg/dL — AB (ref 65–99)
Glucose-Capillary: 64 mg/dL — ABNORMAL LOW (ref 65–99)
Glucose-Capillary: 161 mg/dL — ABNORMAL HIGH (ref 65–99)

## 2017-08-23 LAB — CULTURE, BLOOD (ROUTINE X 2)
CULTURE: NO GROWTH
CULTURE: NO GROWTH
SPECIAL REQUESTS: ADEQUATE
Special Requests: ADEQUATE

## 2017-08-23 LAB — CBC
HEMATOCRIT: 31 % — AB (ref 39.0–52.0)
HEMOGLOBIN: 10.2 g/dL — AB (ref 13.0–17.0)
MCH: 27.4 pg (ref 26.0–34.0)
MCHC: 32.9 g/dL (ref 30.0–36.0)
MCV: 83.3 fL (ref 78.0–100.0)
Platelets: 321 10*3/uL (ref 150–400)
RBC: 3.72 MIL/uL — ABNORMAL LOW (ref 4.22–5.81)
RDW: 14.5 % (ref 11.5–15.5)
WBC: 20.5 10*3/uL — ABNORMAL HIGH (ref 4.0–10.5)

## 2017-08-23 MED ORDER — LEVOFLOXACIN 500 MG PO TABS
500.0000 mg | ORAL_TABLET | Freq: Every day | ORAL | Status: DC
  Administered 2017-08-23: 500 mg via ORAL
  Filled 2017-08-23: qty 1

## 2017-08-23 NOTE — Discharge Summary (Signed)
Name: Spencer Robles MRN: [id number] DOB: Jul 02, 1961 57 y.o. PCP: Tawny Asal  Date of Admission: 08/15/2017 10:28 AM Date of Discharge: 08/27/2017 Attending Physician: Oval Linsey, MD  Discharge Diagnosis: 1. Epididymoorchitis / Scrotal Cellulitis  2. Type 2 Diabetes Mellitus   Principal Problem:   Epididymoorchitis Active Problems:   Diabetes mellitus type 2, uncomplicated (HCC)   Great toe amputation status, right [facility])  Discharge Medications: Allergies as of 08/27/2017   No Known Allergies     Medication List    STOP taking these medications   amoxicillin-clavulanate 875-125 MG tablet Commonly known as:  AUGMENTIN     TAKE these medications   acetaminophen 325 MG tablet Commonly known as:  TYLENOL Take 2 tablets (650 mg total) by mouth every 4 (four) hours as needed for mild pain, fever or headache.   docusate sodium 100 MG capsule Commonly known as:  COLACE Take 1 capsule (100 mg total) by mouth 2 (two) times daily.   doxycycline 100 MG tablet Commonly known as:  VIBRA-TABS Take 1 tablet (100 mg total) by mouth every 12 (twelve) hours for 7 days.   feeding supplement (ENSURE ENLIVE) Liqd Take 237 mLs by mouth 2 (two) times daily between meals.   feeding supplement (PRO-STAT SUGAR FREE 64) Liqd Take 30 mLs by mouth 3 (three) times daily with meals.   insulin aspart 100 UNIT/ML injection Commonly known as:  novoLOG Inject 4 Units into the skin 3 (three) times daily with meals. What changed:    how much to take  when to take this   insulin glargine 100 UNIT/ML injection Commonly known as:  LANTUS Inject 0.3 mLs (30 Units total) into the skin at bedtime. What changed:  how much to take   INSULIN SYRINGE 1CC/29G 29G X 1/2" 1 ML Misc 1 application by Does not apply route 4 (four) times daily.   metFORMIN 500 MG tablet Commonly known as:  GLUCOPHAGE Take 1 tablet (500 mg total) by mouth 2 (two) times daily with a meal.   oxyCODONE 5  MG immediate release tablet Commonly known as:  Oxy IR/ROXICODONE Take 1 tablet (5 mg total) by mouth every 6 (six) hours as needed for up to 7 days for moderate pain or severe pain.   polyethylene glycol packet Commonly known as:  MIRALAX / GLYCOLAX Take 17 g by mouth daily as needed for mild constipation.     Disposition and follow-up:   Spencer Robles was discharged from [facility] in Stable condition.  At the hospital follow up visit please address:  1. Epididymoorchitis / Scrotal Cellulitis: Evaluate healing and ability to perform wound care effectively as patient may not be approved for home health assistance. Evaluate for adequacy of pain control. Ensure follow up with Urology and completion of antibiotic course. Ensure good diabetes control to promote wound healing.  3.  Labs / imaging needed at time of follow-up: None  4.  Pending labs/ test needing follow-up: None  Follow-up Appointments: Follow-up Information    Clent Demark, PA-C. Go on 09/06/2017.   Specialty:  Physician Assistant Why:  Please call to schedule a follow up appointment for a day in the next week@2pm  Contact information: [address] [place] [address] [phone]         Hospital Course by problem list: Principal Problem:   Epididymoorchitis Active Problems:   Diabetes mellitus type 2, uncomplicated ([place])   Great toe amputation status, right ([place])   Epididymoorchitis / Scrotal  Cellulitis. Spencer Robles is a 57 year old male with uncontrolled type 2 diabetes mellitus who presented to the emergency department with signs and symptoms of Epididymoorchitis. Urology was consutled and the patient was started on Ceftriaxone. The patient continued to be febrile with worsening erythema and swelling of the scrotum and penis. He was transitioned to Vancomycin, Cefepime, and Metronidazole. However, again the patient's swelling and erythema progressed. He was taken for I&D on  1/29 with exploration of the scrotum. No abscesses or purulence was noted. The patient incision was left open with wet to dry q12 hour dressing changes. S/p I&D the patient became afebrile and his swelling/erythema significantly improved. Infectious disease was consulted for antibiotic therapy and duration recommendations. Wound cultures were positive for MRSA and patient was transitioned to Doxycycline.Patient remained afebrile for several days and leukocytosis improved. Patient continued to experience dragging sensation worse at left inguinal region. CT Pelvis was performed on 08/26/17 to rule out any residual fluid collection. This showed persistent tissue edema, more prominent in the left inguinal region, consistent with cellulitis and no fluid collection. Urology cleared the patient for discharge the following day. The patient was discharged in stable condition to home as he did not meet criteria for SNF. He was discharged with orders for home health to assist with dressing changes (though it was unclear if this would be approved) and Doxycycline to be taken through 09/03/17, one week of pain medication, and instructions for close follow up.  Type 2 Diabetes Mellitus. The patient has uncontrolled DM with a recent A1c of 14.8%. He was managed in the hospital with Lantus 30 units QHS and Resistant SSI. On discharge he was transitioned to Lantus 30 units QHS. The patient will need outpatient follow-up to determine if he should remain on insulin therapy of be transitioned to oral medications.   Discharge Vitals:   BP (!) 142/85 (BP Location: Right Arm)   Pulse 65   Temp 98.8 F (37.1 C) (Oral)   Resp 20   Ht 5' 11.5" (1.816 m)   Wt 229 lb 4.8 oz (104 kg)   SpO2 98%   BMI 31.54 kg/m   Pertinent Labs, Studies, and Procedures:  CBC Latest Ref Rng & Units 08/26/2017 08/25/2017 08/24/2017  WBC 4.0 - 10.5 K/uL 11.0(H) 12.0(H) 13.8(H)  Hemoglobin 13.0 - 17.0 g/dL 10.5(L) 10.0(L) 10.2(L)  Hematocrit 39.0 - 52.0  % 32.3(L) 30.4(L) 31.3(L)  Platelets 150 - 400 K/uL 424(H) 373 334   BMP Latest Ref Rng & Units 08/24/2017 08/22/2017 08/21/2017  Glucose 65 - 99 mg/dL 99 109(H) 102(H)  BUN 6 - 20 mg/dL 5(L) 8 7  Creatinine 0.61 - 1.24 mg/dL 0.79 0.77 0.78  Sodium 135 - 145 mmol/L 135 134(L) 134(L)  Potassium 3.5 - 5.1 mmol/L 3.6 3.7 3.8  Chloride 101 - 111 mmol/L 100(L) 98(L) 99(L)  CO2 22 - 32 mmol/L 26 24 24   Calcium 8.9 - 10.3 mg/dL 8.1(L) 8.2(L) 8.1(L)   CT Pelvis 08/18/2017: IMPRESSION: Marked edema mainly in the scrotum although some subcutaneous edema tracks anteriorly up into the suprapubic lower anterior abdominal wall. Minimal edema tracking posteriorly into the perineum. Imaging features compatible with cellulitis although third-spacing of fluid into the scrotum could have this appearance. No gas in the soft tissues of the scrotum or perineum to suggest Fournier's gangrene although absence of soft tissue gas does not exclude this etiology. - No evidence for drainable abscess.  CT Pelvis 08/26/2017: IMPRESSION: - No drainable fluid collection/abscess is evident on CT. - Mild subcutaneous  stranding/edema in the left greater than right inguinal/perineal region, extending to the scrotum, suggesting cellulitis. - Postsurgical changes involving the scrotum.  ECHO 08/19/2017: Study Conclusions - Left ventricle: The cavity size was normal. Wall thickness was normal. Systolic function was normal. The estimated ejection fraction was in the range of 60% to 65%. Wall motion was normal; there were no regional wall motion abnormalities. - Left atrium: The atrium was mildly dilated. - Right ventricle: The cavity size was mildly dilated.   Discharge Instructions: Discharge Instructions    Call MD for:  difficulty breathing, headache or visual disturbances   Complete by:  As directed    Call MD for:  persistant dizziness or light-headedness   Complete by:  As directed    Call MD for:  persistant nausea and  vomiting   Complete by:  As directed    Call MD for:  redness, tenderness, or signs of infection (pain, swelling, redness, odor or green/yellow discharge around incision site)   Complete by:  As directed    Call MD for:  severe uncontrolled pain   Complete by:  As directed    Call MD for:  temperature >100.4   Complete by:  As directed    Diet - low sodium heart healthy   Complete by:  As directed    Discharge instructions   Complete by:  As directed    Thank you for allowing [place] to care for you.  Your symptoms were due to an infection of your scrotum and nearby structures: - Please perform dressing changes twice a day as instructed, If you qualify, you may receive home health assistance with this - Continue to take Doxycycline 100mg , Twice a day, for the next 7 days - We have provided you with a prescription for pain medication to be taken as need for the next 7 days - Please follow up with your primary care provider in the next week, to evaluate your progress - Please follow up with Urology as scheduled by their office   Increase activity slowly   Complete by:  As directed       Signed: Neva Seat, MD 08/27/2017, 11:15 AM

## 2017-08-23 NOTE — Consult Note (Signed)
[facility] for Infectious Disease    Date of Admission:  08/15/2017     Total days of antibiotics 9  Day 4 Flagyl                Reason for Consult: antibiotic recommendations    Referring Provider: Evette Doffing   Principal Problem:   Epididymoorchitis Active Problems:   Diabetes mellitus type 2, uncomplicated ([place])   Great toe amputation status, right [facility])  Assessment: 57 y.o. male with s/p debridement for epididymoorchitis (1/29). No longer needs anaerobic coverage as he is s/p debridement. In discussion with him the urethral drainage has stopped and he has had no trouble voiding since surgery. No longer with fevers and wbc count coming down now.   Plan: 1. Would do Levaquin 500 mg QD x 7 days.  2. OK to discharge with close outpatient follow up with urology.   Available outpatient as needed should he not improve with the above recommendations.   Thank you for the consult.    HPI: Spencer Robles is a 57 y.o. male admitted 08/15/2017 with left scrotal swelling and pain that had been progressively worsening prior to admission. He felt as if he was 'coming down with the flu' with fevers, chills, decreased appetite, myalgias and sweating. He is not sexually active and reports himself to have low risk for STIs and reported having intermittent yellow discharge from penis prior to admission.   Hospital Course: he was started on ceftriaxone initially and was transitioned to Cefepime + Vancomycin after no improvement after 48h. Flagyl was added on antibiotic day 5. Throughout admission he had persistent daily fevers until he underwent debridement surgery on 1/29 for this condition.   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 Through 08/06/17. Hgb A1C 14.9%.   Review of Systems: Review of Systems  Constitutional: Negative for chills and fever.  HENT: Negative for sore throat.    Respiratory: Negative for cough and shortness of breath.   Cardiovascular: Positive for leg swelling. Negative for chest pain.  Gastrointestinal: Negative for abdominal pain, constipation and diarrhea.  Genitourinary: Negative for dysuria and hematuria.  Musculoskeletal: Negative for joint pain and myalgias.  Skin: Negative for rash.  Neurological: Positive for weakness. Negative for dizziness.     . docusate sodium  100 mg Oral BID  . enoxaparin (LOVENOX) injection  60 mg Subcutaneous Q24H  . insulin aspart  0-20 Units Subcutaneous TID WC  . insulin glargine  30 Units Subcutaneous QHS  . metroNIDAZOLE  500 mg Oral Q8H  . senna-docusate  1 tablet Oral BID  . sodium chloride flush  3 mL Intravenous Q12H  . sodium chloride flush  3 mL Intravenous Q12H    Past Medical History:  Diagnosis Date  . Diabetes mellitus without complication ([place])   . Epididymitis 08/15/2017  . Hx of necrotising fasciitis 06/2017   RLE/notes 08/15/2017  . Scrotal swelling    left; due to Epididymitis/notes 08/15/2017  . Sleep apnea    does not use a cpap    Social History   Tobacco Use  . Smoking status: Never Smoker  . Smokeless tobacco: Never Used  Substance Use Topics  . Alcohol use: No    Alcohol/week: 0.0 oz  . Drug use: No    History reviewed. No pertinent family history. No Known Allergies  OBJECTIVE: Blood pressure 138/70, pulse 82,  temperature 99.5 F (37.5 C), temperature source Oral, resp. rate 18, height 5' 11.5" (1.816 m), weight 249 lb 1.6 oz (113 kg), SpO2 96 %.  Physical Exam  Constitutional: He is oriented to person, place, and time and well-developed, well-nourished, and in no distress.  HENT:  Mouth/Throat: No oral lesions. Normal dentition. No dental caries.  Eyes: No scleral icterus.  Cardiovascular: Normal rate, regular rhythm and normal heart sounds.  Pulmonary/Chest: Effort normal and breath sounds normal.  Abdominal: Soft. He exhibits no distension. There is no  tenderness.  Genitourinary:  Genitourinary Comments: Edematous scrotum. Wound to posterior scrotom with moderate serosanguinous drainage noted to gauze w/o odor. Flaking skin noted.   Lymphadenopathy:    He has no cervical adenopathy.  Neurological: He is alert and oriented to person, place, and time.  Skin: Skin is warm and dry. No rash noted.  Psychiatric: Mood and affect normal.    Lab Results Lab Results  Component Value Date   WBC 20.5 (H) 08/23/2017   HGB 10.2 (L) 08/23/2017   HCT 31.0 (L) 08/23/2017   MCV 83.3 08/23/2017   PLT 321 08/23/2017    Lab Results  Component Value Date   CREATININE 0.77 08/22/2017   BUN 8 08/22/2017   NA 134 (L) 08/22/2017   K 3.7 08/22/2017   CL 98 (L) 08/22/2017   CO2 24 08/22/2017    Lab Results  Component Value Date   ALT 21 08/19/2017   AST 25 08/19/2017   ALKPHOS 101 08/19/2017   BILITOT 1.0 08/19/2017     Microbiology: Recent Results (from the past 240 hour(s))  Culture, blood (routine x 2)     Status: None   Collection Time: 08/15/17  1:36 PM  Result Value Ref Range Status   Specimen Description BLOOD LEFT HAND  Final   Special Requests IN PEDIATRIC BOTTLE Blood Culture adequate volume  Final   Culture NO GROWTH 5 DAYS  Final   Report Status 08/20/2017 FINAL  Final  Culture, blood (routine x 2)     Status: None   Collection Time: 08/15/17  1:37 PM  Result Value Ref Range Status   Specimen Description BLOOD RIGHT ANTECUBITAL  Final   Special Requests IN PEDIATRIC BOTTLE Blood Culture adequate volume  Final   Culture NO GROWTH 5 DAYS  Final   Report Status 08/20/2017 FINAL  Final  Culture, blood (routine x 2)     Status: None   Collection Time: 08/18/17 12:56 AM  Result Value Ref Range Status   Specimen Description BLOOD RIGHT HAND  Final   Special Requests   Final    BOTTLES DRAWN AEROBIC AND ANAEROBIC Blood Culture adequate volume   Culture NO GROWTH 5 DAYS  Final   Report Status 08/23/2017 FINAL  Final  Culture,  blood (routine x 2)     Status: None   Collection Time: 08/18/17 12:58 AM  Result Value Ref Range Status   Specimen Description BLOOD LEFT ANTECUBITAL  Final   Special Requests   Final    BOTTLES DRAWN AEROBIC AND ANAEROBIC Blood Culture adequate volume   Culture NO GROWTH 5 DAYS  Final   Report Status 08/23/2017 FINAL  Final  Surgical pcr screen     Status: None   Collection Time: 08/21/17  5:13 AM  Result Value Ref Range Status   MRSA, PCR NEGATIVE NEGATIVE Final   Staphylococcus aureus NEGATIVE NEGATIVE Final    Comment: (NOTE) The Xpert SA Assay (FDA approved for NASAL specimens in  patients 2 years of age and older), is one component of a comprehensive surveillance program. It is not intended to diagnose infection nor to guide or monitor treatment.   Aerobic/Anaerobic Culture (surgical/deep wound)     Status: None (Preliminary result)   Collection Time: 08/21/17  6:15 PM  Result Value Ref Range Status   Specimen Description ABSCESS  Final   Special Requests SCROTUM  Final   Gram Stain   Final    RARE WBC PRESENT, PREDOMINANTLY PMN RARE GRAM POSITIVE COCCI IN CLUSTERS RARE GRAM VARIABLE ROD    Culture NO GROWTH < 24 HOURS  Final   Report Status PENDING  Incomplete    Janene Madeira, MSN, NP-C [place] for Infectious Disease [facility] Cell: [phone] Pager: [phone]  08/23/2017 1:44 PM

## 2017-08-23 NOTE — Progress Notes (Signed)
**Note Spencer-Identified via Obfuscation**    Subjective: Patient is much more comfortable this AM. He got his catheter out overnight and is doing much better. He is no longer having the recurrent urge to urinate. His pain is significantly improved and he feels that his scrotal and penile swelling are decreasing. We discussed the plan to continue antibiotics at this point and coordinate care with Urology. All questions and concerns addressed.   Objective: Vital signs in last 24 hours: Vitals:   08/22/17 0124 08/22/17 0638 08/22/17 2058 08/23/17 0508  BP: 116/80 133/71 140/77 (!) 145/64  Pulse: 89 78 77 83  Resp: 20 18 18 20   Temp: 99.1 F (37.3 C) 99.5 F (37.5 C) 98.9 F (37.2 C) 99.4 F (37.4 C)  TempSrc: Oral Oral Oral Oral  SpO2: 94% 94% 96% 95%  Weight:  250 lb 0 oz (113.4 kg)  249 lb 1.6 oz (113 kg)  Height:       General: Obese male  Pulm: Good air movement with no wheezing or crackles  CV: RRR, no murmurs, no rubs  Abdomen: Active bowel sounds, soft, non-distended GU: Scrotal/penile edema and erythema continue to improve compared to prior exam  Assessment/Plan:  Spencer Robles a 57 year old male with uncontrolled type 2 diabetes mellitus who presented to the emergency departmenton 1/23with complaints of progressive scrotalpain and swelling for 2 days duration.Findings are consistent with epididyoorchitis.Patient was taken for I&D on 1/29 with no obvious abscess identified. Currently on Vanc, Cefepime, and Metronidazole. Further management as outlined below.  Epididymoorchitis / Scrotal Cellulitis  -Patient has been afebrile for 24 hours. Leukocytosis improving  - S/p I&D on 1/29  - Catheter removed yesterday evening by urology - Wound cultures have shown no growth to date  - Wet to dry dressing changes every shift - Pain management with Dilaudid 1.5 mg every 2 hours and Oxycodone IR 10 mg every 4 hours  - Continue Vanc, Cefepime, and Metronidazole(Day #8 total Abx) - Appreciate urology's help. - Have  consulted social work for placement for wound care vs home health   Uncontrolled diabetes mellitus. -Good CBG control with current regimen  -ContinueLantus to 30 units QHS and SSI-resistant.  Thrombocytopenia. Resolved AKI.Pre-renal. Resolved Hypokalemia.Resolved  Dispo: Anticipated discharge in approximately >1 day(s).   [name], MD 08/23/2017, 5:51 AM My Pager: [phone]

## 2017-08-23 NOTE — Progress Notes (Signed)
Pharmacy Note  Please note that patient does not have a blood sugar monitor or testing supplies at home. Please write a prescription for these supplies at discharge!  Thanks,   Jimmy Footman, PharmD, BCPS PGY2 Infectious Diseases Pharmacy Resident Pager: [phone]

## 2017-08-24 DIAGNOSIS — B9562 Methicillin resistant Staphylococcus aureus infection as the cause of diseases classified elsewhere: Secondary | ICD-10-CM

## 2017-08-24 LAB — CBC
HCT: 31.3 % — ABNORMAL LOW (ref 39.0–52.0)
Hemoglobin: 10.2 g/dL — ABNORMAL LOW (ref 13.0–17.0)
MCH: 27.3 pg (ref 26.0–34.0)
MCHC: 32.6 g/dL (ref 30.0–36.0)
MCV: 83.9 fL (ref 78.0–100.0)
Platelets: 334 10*3/uL (ref 150–400)
RBC: 3.73 MIL/uL — ABNORMAL LOW (ref 4.22–5.81)
RDW: 14.5 % (ref 11.5–15.5)
WBC: 13.8 10*3/uL — ABNORMAL HIGH (ref 4.0–10.5)

## 2017-08-24 LAB — BASIC METABOLIC PANEL
Anion gap: 9 (ref 5–15)
BUN: 5 mg/dL — ABNORMAL LOW (ref 6–20)
CALCIUM: 8.1 mg/dL — AB (ref 8.9–10.3)
CO2: 26 mmol/L (ref 22–32)
Chloride: 100 mmol/L — ABNORMAL LOW (ref 101–111)
Creatinine, Ser: 0.79 mg/dL (ref 0.61–1.24)
GFR calc Af Amer: >60 mL/min (ref >60)
GLUCOSE: 99 mg/dL (ref 65–99)
Potassium: 3.6 mmol/L (ref 3.5–5.1)
Sodium: 135 mmol/L (ref 135–145)

## 2017-08-24 LAB — GLUCOSE, CAPILLARY
GLUCOSE-CAPILLARY: 81 mg/dL (ref 65–99)
GLUCOSE-CAPILLARY: 121 mg/dL — AB (ref 65–99)
Glucose-Capillary: 99 mg/dL (ref 65–99)

## 2017-08-24 MED ORDER — DOXYCYCLINE HYCLATE 100 MG PO TABS
100.0000 mg | ORAL_TABLET | Freq: Two times a day (BID) | ORAL | Status: DC
  Administered 2017-08-24 – 2017-08-27 (×8): 100 mg via ORAL
  Filled 2017-08-24 (×8): qty 1

## 2017-08-24 NOTE — Progress Notes (Signed)
   Subjective: Patient was seen resting in his bed this morning. He states that his pain remains controlled and that the swelling in his groin has continued to improve. We discussed that his cultures became positive for MRSA this morning and that meant a change of antibiotics for him and new precautions for staff. He has no other complaints this morning.  Objective: Vital signs in last 24 hours: Vitals:   08/23/17 0508 08/23/17 1329 08/23/17 1956 08/24/17 0416  BP: (!) 145/64 138/70 (!) 158/91 (!) 141/86  Pulse: 83 82 72 77  Resp: 20 18 18 18   Temp: 99.4 F (37.4 C) 99.5 F (37.5 C) 99 F (37.2 C) 99 F (37.2 C)  TempSrc: Oral Oral Oral Oral  SpO2: 95% 96% 98% 98%  Weight: 249 lb 1.6 oz (113 kg)   248 lb 3.2 oz (112.6 kg)  Height:       Physical Exam  Constitutional: He is oriented to person, place, and time. He appears well-developed and well-nourished.  HENT:  Head: Normocephalic and atraumatic.  Eyes: EOM are normal. Right eye exhibits no discharge. Left eye exhibits no discharge.  Cardiovascular: Normal rate, regular rhythm, normal heart sounds and intact distal pulses.  Pulmonary/Chest: Effort normal and breath sounds normal. No respiratory distress.  Abdominal: Soft. Bowel sounds are normal. He exhibits no distension. There is no tenderness.  Genitourinary:  Genitourinary Comments: Scrotal/penile edema and erythema continues to improve, wrapped in dressing with tape this morning after being seen by urology  Musculoskeletal: He exhibits no edema or deformity.  Neurological: He is alert and oriented to person, place, and time.  Skin: Skin is warm and dry.    Assessment/Plan: Spencer Robles a 57 year old male with uncontrolled type 2 diabetes mellitus who presented to the emergency departmenton 1/23with complaints of progressive scrotalpain and swelling for 2 days duration.Findings are consistent with epididyoorchitis.  Epididymoorchitis / Scrotal Cellulitis:  Improving. S/P I&D on 1/29. Catheter removed 1/30 by urology. Wound cultures positive for MRSA. Patient has been afebrile since 1/30. Leukocytosis improving - Appreciate Urology and ID Reccomendations - Wet to dry dressing changes every shift - Pain management with Dilaudid 1.5 mg every 2 hours and Oxycodone IR 10 mg every 4 hours  - Antibiotics changed to Levaquin 1/31 and then to Doxycyline 2/1 (due to MRSA) (Day #9 total Abx) - Have consulted social work for placement for wound care vs home health  - AM CBC to follow WBC  Uncontrolled diabetes mellitus. -Good CBG control with current regimen  -ContinueLantus to 30 units QHS and SSI-resistant.  Thrombocytopenia. Resolved AKI.Pre-renal. Resolved Hypokalemia.Resolved  Dispo: Anticipated discharge in approximately 1-3 day(s).   Neva Seat, MD 08/24/2017, 6:05 PM My Pager: [phone]

## 2017-08-24 NOTE — Progress Notes (Addendum)
Patient ID: Spencer Robles, male   DOB: [birth year], 59 y.o.   MRN: [id number]  3 Days Post-Op Subjective: Pt still with scrotal pain but improving.  No fever for > 24 hrs now.  WBC improved yesterday.  Objective: Vital signs in last 24 hours: Temp:  [99 F (37.2 C)-99.5 F (37.5 C)] 99 F (37.2 C) (02/01 0416) Pulse Rate:  [72-82] 77 (02/01 0416) Resp:  [18] 18 (02/01 0416) BP: (138-158)/(70-91) 141/86 (02/01 0416) SpO2:  [96 %-98 %] 98 % (02/01 0416) Weight:  [112.6 kg (248 lb 3.2 oz)] 112.6 kg (248 lb 3.2 oz) (02/01 0416)  Intake/Output from previous day: 01/31 0701 - 02/01 0700 In: 240 [P.O.:240] Out: -  Intake/Output this shift: No intake/output data recorded.  Physical Exam:  General: Alert and oriented GU: Edema now improving but still impressive.  Erythema definitely improved over last 48 hrs.  Wound inspected and there is a large amount of fibrinous material but no necrotic tissue or fluctuance.  Beginnings of granulation tissue.  Scrotal skin still with breakdown anteriorly but no necrosis.  Lab Results: Recent Labs    08/22/17 0500 08/23/17 0539  HGB 10.3* 10.2*  HCT 30.4* 31.0*   CBC Latest Ref Rng & Units 08/23/2017 08/22/2017 08/21/2017  WBC 4.0 - 10.5 K/uL 20.5(H) 28.5(H) 19.1(H)  Hemoglobin 13.0 - 17.0 g/dL 10.2(L) 10.3(L) 10.7(L)  Hematocrit 39.0 - 52.0 % 31.0(L) 30.4(L) 30.6(L)  Platelets 150 - 400 K/uL 321 255 139(L)     BMET Recent Labs    08/22/17 0500  NA 134*  K 3.7  CL 98*  CO2 24  GLUCOSE 109*  BUN 8  CREATININE 0.77  CALCIUM 8.2*     Studies/Results: No results found.  Assessment/Plan: Scrotal abscess s/p I&D on 1/29 - Definite improvement since I&D.  Awaiting CBC from this morning.  Cultures growing GPC with rare S. Aureus. Would continue IV antibiotics with Vancomycin pending final culture results (would not switch to levofloxacin at this time until OR cultures are final).  I have reinforced need for Wet to Dry saline dressings q  shift right now with nursing staff (dressing was quite wet this morning).  Will need plan for outpatient wound care with at least dressing changes BID with either home health or SNF.  If patient continues to improve, possibly will be ready for discharge early next week.     LOS: 9 days   Elesia Pemberton,LES 08/24/2017, 7:13 AM    Culture returned positive for MRSA.  Would change to doxycycline and continue for 10 days.

## 2017-08-25 LAB — GLUCOSE, CAPILLARY
GLUCOSE-CAPILLARY: 151 mg/dL — AB (ref 65–99)
Glucose-Capillary: 143 mg/dL — ABNORMAL HIGH (ref 65–99)
Glucose-Capillary: 136 mg/dL — ABNORMAL HIGH (ref 65–99)
Glucose-Capillary: 166 mg/dL — ABNORMAL HIGH (ref 65–99)

## 2017-08-25 LAB — CBC
HCT: 30.4 % — ABNORMAL LOW (ref 39.0–52.0)
HEMOGLOBIN: 10 g/dL — AB (ref 13.0–17.0)
MCH: 27.9 pg (ref 26.0–34.0)
MCHC: 32.9 g/dL (ref 30.0–36.0)
MCV: 84.9 fL (ref 78.0–100.0)
Platelets: 373 10*3/uL (ref 150–400)
RBC: 3.58 MIL/uL — ABNORMAL LOW (ref 4.22–5.81)
RDW: 14.5 % (ref 11.5–15.5)
WBC: 12 10*3/uL — ABNORMAL HIGH (ref 4.0–10.5)

## 2017-08-25 MED ORDER — RAMELTEON 8 MG PO TABS
8.0000 mg | ORAL_TABLET | Freq: Once | ORAL | Status: AC
  Administered 2017-08-25: 8 mg via ORAL
  Filled 2017-08-25: qty 1

## 2017-08-25 NOTE — Progress Notes (Signed)
Continues to refuse bed alarm. Will continue to monitor.

## 2017-08-25 NOTE — Progress Notes (Signed)
4 Days Post-Op  Subjective: Spencer Robles has minimal complaints today.  He is s/p I&D of the scrotum on 1/29.  No abscess was found. He remains on antibiotic therapy.  His temp spiked to 104 but is now back to normal which makes me wonder if that high temp was erroneous.   He states he is voiding and his pain control is good.  He is getting his dressing changes.  His WBC's are down slightly.   ROS:  Review of Systems  Constitutional: Negative for chills and fever.  Gastrointestinal: Negative for abdominal pain.    Anti-infectives: Anti-infectives (From admission, onward)   Start     Dose/Rate Route Frequency Ordered Stop   08/24/17 1115  doxycycline (VIBRA-TABS) tablet 100 mg     100 mg Oral Every 12 hours 08/24/17 1108 08/31/17 0959   08/23/17 1800  levofloxacin (LEVAQUIN) tablet 500 mg  Status:  Discontinued     500 mg Oral Daily 08/23/17 1402 08/24/17 1108   08/21/17 1827  polymyxin B 500,000 Units, bacitracin 50,000 Units in sodium chloride 0.9 % 500 mL irrigation  Status:  Discontinued       As needed 08/21/17 1827 08/21/17 1916   08/21/17 1200  vancomycin (VANCOCIN) 1,500 mg in sodium chloride 0.9 % 500 mL IVPB  Status:  Discontinued     1,500 mg 250 mL/hr over 120 Minutes Intravenous Every 12 hours 08/20/17 2315 08/23/17 1402   08/20/17 2330  vancomycin (VANCOCIN) 500 mg in sodium chloride 0.9 % 100 mL IVPB     500 mg 100 mL/hr over 60 Minutes Intravenous  Once 08/20/17 2315 08/21/17 0126   08/20/17 2100  metroNIDAZOLE (FLAGYL) tablet 500 mg  Status:  Discontinued     500 mg Oral Every 8 hours 08/20/17 1435 08/23/17 1402   08/20/17 1100  metroNIDAZOLE (FLAGYL) IVPB 500 mg  Status:  Discontinued     500 mg 100 mL/hr over 60 Minutes Intravenous Every 8 hours 08/20/17 1051 08/20/17 1435   08/18/17 1000  vancomycin (VANCOCIN) IVPB 1000 mg/200 mL premix  Status:  Discontinued     1,000 mg 200 mL/hr over 60 Minutes Intravenous Every 12 hours 08/17/17 2118 08/20/17 2319   08/17/17 2130   vancomycin (VANCOCIN) 2,000 mg in sodium chloride 0.9 % 500 mL IVPB     2,000 mg 250 mL/hr over 120 Minutes Intravenous  Once 08/17/17 2118 08/18/17 0121   08/17/17 1900  ceFEPIme (MAXIPIME) 1 g in dextrose 5 % 50 mL IVPB  Status:  Discontinued     1 g 100 mL/hr over 30 Minutes Intravenous Every 8 hours 08/17/17 1653 08/23/17 1402   08/15/17 1930  cefTRIAXone (ROCEPHIN) 2 g in dextrose 5 % 50 mL IVPB  Status:  Discontinued     2 g 100 mL/hr over 30 Minutes Intravenous Every 24 hours 08/15/17 1848 08/17/17 1643   08/15/17 1215  levofloxacin (LEVAQUIN) IVPB 500 mg     500 mg 100 mL/hr over 60 Minutes Intravenous  Once 08/15/17 1214 08/15/17 1335      Current Facility-Administered Medications  Medication Dose Route Frequency Provider Last Rate Last Dose  . 0.9 %  sodium chloride infusion  250 mL Intravenous PRN Maryellen Pile, MD   Stopped at 08/17/17 0033  . acetaminophen (TYLENOL) tablet 650 mg  650 mg Oral Q6H PRN Maryellen Pile, MD   650 mg at 08/20/17 1530   Or  . acetaminophen (TYLENOL) suppository 650 mg  650 mg Rectal Q6H PRN Maryellen Pile, MD      .  docusate sodium (COLACE) capsule 100 mg  100 mg Oral BID Maryellen Pile, MD   100 mg at 08/25/17 0849  . doxycycline (VIBRA-TABS) tablet 100 mg  100 mg Oral Q12H Tommy Medal, Lavell Islam, MD   100 mg at 08/25/17 0848  . enoxaparin (LOVENOX) injection 60 mg  60 mg Subcutaneous Q24H Axel Filler, MD   60 mg at 08/24/17 2137  . HYDROmorphone (DILAUDID) injection 1.5 mg  1.5 mg Intravenous Q2H PRN [name], MD   1.5 mg at 08/25/17 1223  . insulin aspart (novoLOG) injection 0-20 Units  0-20 Units Subcutaneous TID WC Molt, Bethany, DO   3 Units at 08/25/17 1224  . insulin glargine (LANTUS) injection 30 Units  30 Units Subcutaneous QHS Molt, Bethany, DO   30 Units at 08/24/17 2322  . lactated ringers infusion   Intravenous Continuous Effie Berkshire, MD      . oxybutynin [facility]) tablet 5 mg  5 mg Oral Q8H PRN Bjorn Loser, MD   5 mg at 08/22/17 0802  . oxyCODONE (Oxy IR/ROXICODONE) immediate release tablet 10 mg  10 mg Oral Q4H PRN Molt, Bethany, DO   10 mg at 08/25/17 0848  . senna-docusate (Senokot-S) tablet 1 tablet  1 tablet Oral BID Maryellen Pile, MD   1 tablet at 08/25/17 0848  . sodium chloride flush (NS) 0.9 % injection 3 mL  3 mL Intravenous Q12H Maryellen Pile, MD   3 mL at 08/25/17 0850  . sodium chloride flush (NS) 0.9 % injection 3 mL  3 mL Intravenous Q12H Maryellen Pile, MD   3 mL at 08/25/17 0850  . sodium chloride flush (NS) 0.9 % injection 3 mL  3 mL Intravenous PRN Maryellen Pile, MD         Objective: Vital signs in last 24 hours: Temp:  [98.5 F (36.9 C)-104 F (40 C)] 98.5 F (36.9 C) (02/02 1313) Pulse Rate:  [74-77] 74 (02/02 1313) Resp:  [18-20] 20 (02/02 1313) BP: (137-139)/(64-89) 137/64 (02/02 1313) SpO2:  [96 %-100 %] 100 % (02/02 1313)  Intake/Output from previous day: 02/01 0701 - 02/02 0700 In: 660 [P.O.:600] Out: 351 [Urine:350; Stool:1] Intake/Output this shift: Total I/O In: 240 [P.O.:240] Out: -    Physical Exam   Gen: WD, WN BM in NAD. GU: He has persistent marked penoscrotal edema with an eschar on the anterior scrotum.  Wound dressing in place.  No fluctuance,   Lab Results:  Recent Labs    08/24/17 0526 08/25/17 0504  WBC 13.8* 12.0*  HGB 10.2* 10.0*  HCT 31.3* 30.4*  PLT 334 373   BMET Recent Labs    08/24/17 0526  NA 135  K 3.6  CL 100*  CO2 26  GLUCOSE 99  BUN 5*  CREATININE 0.79  CALCIUM 8.1*   PT/INR No results for input(s): LABPROT, INR in the last 72 hours. ABG No results for input(s): PHART, HCO3 in the last 72 hours.  Invalid input(s): PCO2, PO2  Studies/Results: No results found.   Assessment and Plan: S/P scrotal I&D with persistent edema and scrotal eschar.   Continue dressing changes and we will follow.       LOS: 10 days    Irine Seal 08/25/2017 [phone]

## 2017-08-25 NOTE — Plan of Care (Signed)
  Coping: Level of anxiety will decrease 08/25/2017 0505 - Completed/Met by Evert Kohl, RN   Safety: Ability to remain free from injury will improve 08/25/2017 0505 - Completed/Met by Garnett-Mellinger, Sophronia Simas, RN

## 2017-08-25 NOTE — Progress Notes (Signed)
Pt stable, vitals stable, pain medicine given around the clock for scrotal pain alternating tab and IV, RN asked couple of times to dressing changed this afternoon but patient refused and on assessment dressing not damp or wet, it is situated in a position and is dry, will reassess again timely, no other complaints of distress, appetite good, No BM today so far, will continue to monitor the patient  Palma Holter, RN

## 2017-08-25 NOTE — Progress Notes (Signed)
   Subjective: Patient was feeling better this morning.  Sitting in chair.  His pain is well controlled with current pain regimen.  He denies any problem with voiding.  He would like to go to a skilled nursing facility for wound care after discharge.  Objective:  Vital signs in last 24 hours: Vitals:   08/24/17 0416 08/24/17 2108 08/25/17 0700 08/25/17 1313  BP: (!) 141/86 139/89  137/64  Pulse: 77 77  74  Resp: 18 18  20   Temp: 99 F (37.2 C) 99.6 F (37.6 C) (!) 104 F (40 C) 98.5 F (36.9 C)  TempSrc: Oral Oral  Oral  SpO2: 98% 96%  100%  Weight: 248 lb 3.2 oz (112.6 kg)     Height:       Gen. well-developed, well-nourished gentleman, in no acute distress. Chest.  Clear bilaterally. CVS.  Regular rate and rhythm. Abdomen.  Soft, nontender, bowel sounds positive. GU.  Marked scrotal and penile edema, appears improving.  Scrotum wrapped in dressing with some serosanguineous discharge. Extremities.  No edema, no cyanosis, pulses intact and symmetrical.  Assessment/Plan:  Spencer Robles a 57 year old male with uncontrolled type 2 diabetes mellitus who presented to the emergency departmenton1/23with complaints of progressive scrotalpain and swelling for 2 days duration.  MRSA positive Scrotal cellulitis/Epididymoorchitis.  Continue to improve, patient remained afebrile since January 30.  Leukocytosis continued to improve at 12 today as compared to 13.8 yesterday. -Continue doxycycline ( Day 10 of AB) -Continue pain management with oxycodone. -We appreciate urology and ID recommendations. -A.m. CBC follow leukocytosis.  Diabetes mellitus.  CBG between 99-143 over the past 24-hour. -ContinueLantus to 30 units QHS and resistant SSI.   Dispo: Anticipated discharge in approximately 2-3 day(s).   Lorella Nimrod, MD 08/25/2017, 3:04 PM Pager: [phone]

## 2017-08-26 ENCOUNTER — Inpatient Hospital Stay (HOSPITAL_COMMUNITY): Payer: Self-pay

## 2017-08-26 LAB — AEROBIC/ANAEROBIC CULTURE W GRAM STAIN (SURGICAL/DEEP WOUND)

## 2017-08-26 LAB — CBC
HCT: 32.3 % — ABNORMAL LOW (ref 39.0–52.0)
HEMOGLOBIN: 10.5 g/dL — AB (ref 13.0–17.0)
MCH: 27.7 pg (ref 26.0–34.0)
MCHC: 32.5 g/dL (ref 30.0–36.0)
MCV: 85.2 fL (ref 78.0–100.0)
Platelets: 424 10*3/uL — ABNORMAL HIGH (ref 150–400)
RBC: 3.79 MIL/uL — AB (ref 4.22–5.81)
RDW: 14.5 % (ref 11.5–15.5)
WBC: 11 10*3/uL — AB (ref 4.0–10.5)

## 2017-08-26 LAB — GLUCOSE, CAPILLARY
GLUCOSE-CAPILLARY: 154 mg/dL — AB (ref 65–99)
Glucose-Capillary: 111 mg/dL — ABNORMAL HIGH (ref 65–99)
Glucose-Capillary: 161 mg/dL — ABNORMAL HIGH (ref 65–99)
Glucose-Capillary: 147 mg/dL — ABNORMAL HIGH (ref 65–99)

## 2017-08-26 LAB — AEROBIC/ANAEROBIC CULTURE (SURGICAL/DEEP WOUND)

## 2017-08-26 NOTE — Progress Notes (Signed)
Patient refused bed alarm. Will continue to monitor patient. 

## 2017-08-26 NOTE — Plan of Care (Signed)
  Pain Managment: General experience of comfort will improve 08/26/2017 0124 - Completed/Met by Garnett-Mellinger, Sophronia Simas, RN

## 2017-08-26 NOTE — Progress Notes (Signed)
   Subjective: Patient condition remained the same and seen this morning.  He was complaining of dragging sensation in his scrotum whenever he is upright.  He denies any problem with voiding.  He remained afebrile, except one reading of 104 temperature without any explanation.  Patient denied today having any fever for the past few days.  Objective:  Vital signs in last 24 hours: Vitals:   08/25/17 2102 08/26/17 0340 08/26/17 0629 08/26/17 0930  BP: 135/72  136/78 120/78  Pulse: 71  65 68  Resp: 18  18 18   Temp: 99 F (37.2 C)  99 F (37.2 C) 98 F (36.7 C)  TempSrc: Oral  Oral Oral  SpO2: 96%  95% 98%  Weight:  238 lb 14.4 oz (108.4 kg)    Height:       General.  Well-developed gentleman, lying comfortably in bed, in no acute distress. GU.  Marked penile and scrotal edema, unchanged from yesterday.  Scrotum wrapped in dressing with some drainage. Lungs.  Clear bilaterally. CVS.  Regular rate and rhythm. Abdomen.  Soft, nontender, bowel sounds positive. Extremities.  No edema, no cyanosis, pulses intact and symmetrical.  Assessment/Plan:  Spencer Robles a 57 year old male with uncontrolled type 2 diabetes mellitus who presented to the emergency departmenton1/23with complaints of progressive scrotalpain and swelling for 2 days duration.  MRSA positive Scrotal cellulitis/Epididymoorchitis.  His swelling looks pretty persistent, he remained afebrile for the past few days.  There was one reading of temperature 104 which quickly resolved without any intervention most likely a typo error.  Leukocytosis continued to improve.   Urology ordered a CT pelvis to rule out any other fluid collection. -Continue doxycycline.(Day 11 of AB). -CT pelvis ordered by urology pending. -Continue pain management with oxycodone. -Appreciate urology recommendations.  Diabetes mellitus.  CBG between 111-163 over the past 24-hour. -ContinueLantus to 30 units QHS and resistant SSI.  Dispo:  Anticipated discharge in approximately 2-3 day(s).   Lorella Nimrod, MD 08/26/2017, 11:41 AM Pager: [phone]

## 2017-08-26 NOTE — Progress Notes (Signed)
Pt stable, vitals stable, pain medicine given around the clock for scrotal pain , Dressing changed,  no other complaints of distress or SOB, appetite good, CT of pelvis done, will continue to monitor the patient  Palma Holter, RN

## 2017-08-26 NOTE — Progress Notes (Addendum)
5 Days Post-Op  Subjective: CC: Scrotal pain.  Hx:  Spencer Robles is 5 days out from scrotal exploration.  He continues to have penoscrotal edema and also reports persistent induration and swelling with pain in the left inguinal area that hasn't improved.  He has the most pain from the weight of the scrotum pulling down when he is upright.   He is voiding well.  He has no further significant fever.  His wound culture has grown MRSA.   ROS:  Review of Systems  Constitutional: Negative for fever.  Respiratory: Negative for shortness of breath.   Cardiovascular: Negative for chest pain.  Gastrointestinal:       Left inguinal pain only.   Genitourinary:       See history.    Anti-infectives: Anti-infectives (From admission, onward)   Start     Dose/Rate Route Frequency Ordered Stop   08/24/17 1115  doxycycline (VIBRA-TABS) tablet 100 mg     100 mg Oral Every 12 hours 08/24/17 1108 08/31/17 0959   08/23/17 1800  levofloxacin (LEVAQUIN) tablet 500 mg  Status:  Discontinued     500 mg Oral Daily 08/23/17 1402 08/24/17 1108   08/21/17 1827  polymyxin B 500,000 Units, bacitracin 50,000 Units in sodium chloride 0.9 % 500 mL irrigation  Status:  Discontinued       As needed 08/21/17 1827 08/21/17 1916   08/21/17 1200  vancomycin (VANCOCIN) 1,500 mg in sodium chloride 0.9 % 500 mL IVPB  Status:  Discontinued     1,500 mg 250 mL/hr over 120 Minutes Intravenous Every 12 hours 08/20/17 2315 08/23/17 1402   08/20/17 2330  vancomycin (VANCOCIN) 500 mg in sodium chloride 0.9 % 100 mL IVPB     500 mg 100 mL/hr over 60 Minutes Intravenous  Once 08/20/17 2315 08/21/17 0126   08/20/17 2100  metroNIDAZOLE (FLAGYL) tablet 500 mg  Status:  Discontinued     500 mg Oral Every 8 hours 08/20/17 1435 08/23/17 1402   08/20/17 1100  metroNIDAZOLE (FLAGYL) IVPB 500 mg  Status:  Discontinued     500 mg 100 mL/hr over 60 Minutes Intravenous Every 8 hours 08/20/17 1051 08/20/17 1435   08/18/17 1000  vancomycin  (VANCOCIN) IVPB 1000 mg/200 mL premix  Status:  Discontinued     1,000 mg 200 mL/hr over 60 Minutes Intravenous Every 12 hours 08/17/17 2118 08/20/17 2319   08/17/17 2130  vancomycin (VANCOCIN) 2,000 mg in sodium chloride 0.9 % 500 mL IVPB     2,000 mg 250 mL/hr over 120 Minutes Intravenous  Once 08/17/17 2118 08/18/17 0121   08/17/17 1900  ceFEPIme (MAXIPIME) 1 g in dextrose 5 % 50 mL IVPB  Status:  Discontinued     1 g 100 mL/hr over 30 Minutes Intravenous Every 8 hours 08/17/17 1653 08/23/17 1402   08/15/17 1930  cefTRIAXone (ROCEPHIN) 2 g in dextrose 5 % 50 mL IVPB  Status:  Discontinued     2 g 100 mL/hr over 30 Minutes Intravenous Every 24 hours 08/15/17 1848 08/17/17 1643   08/15/17 1215  levofloxacin (LEVAQUIN) IVPB 500 mg     500 mg 100 mL/hr over 60 Minutes Intravenous  Once 08/15/17 1214 08/15/17 1335      Current Facility-Administered Medications  Medication Dose Route Frequency Provider Last Rate Last Dose  . 0.9 %  sodium chloride infusion  250 mL Intravenous PRN Maryellen Pile, MD   Stopped at 08/17/17 0033  . acetaminophen (TYLENOL) tablet 650 mg  650 mg  Oral Q6H PRN Maryellen Pile, MD   650 mg at 08/20/17 1530   Or  . acetaminophen (TYLENOL) suppository 650 mg  650 mg Rectal Q6H PRN Maryellen Pile, MD      . docusate sodium (COLACE) capsule 100 mg  100 mg Oral BID Maryellen Pile, MD   100 mg at 08/26/17 0630  . doxycycline (VIBRA-TABS) tablet 100 mg  100 mg Oral Q12H Tommy Medal, Lavell Islam, MD   100 mg at 08/26/17 1601  . enoxaparin (LOVENOX) injection 60 mg  60 mg Subcutaneous Q24H Axel Filler, MD   60 mg at 08/25/17 2226  . HYDROmorphone (DILAUDID) injection 1.5 mg  1.5 mg Intravenous Q2H PRN [name], MD   1.5 mg at 08/25/17 1223  . insulin aspart (novoLOG) injection 0-20 Units  0-20 Units Subcutaneous TID WC Molt, Bethany, DO   4 Units at 08/25/17 1644  . insulin glargine (LANTUS) injection 30 Units  30 Units Subcutaneous QHS Molt, Bethany, DO    30 Units at 08/25/17 2230  . lactated ringers infusion   Intravenous Continuous Effie Berkshire, MD      . oxybutynin [facility]) tablet 5 mg  5 mg Oral Q8H PRN Bjorn Loser, MD   5 mg at 08/22/17 0802  . oxyCODONE (Oxy IR/ROXICODONE) immediate release tablet 10 mg  10 mg Oral Q4H PRN Molt, Bethany, DO   10 mg at 08/26/17 0928  . senna-docusate (Senokot-S) tablet 1 tablet  1 tablet Oral BID Maryellen Pile, MD   1 tablet at 08/26/17 [phone]  . sodium chloride flush (NS) 0.9 % injection 3 mL  3 mL Intravenous Q12H Maryellen Pile, MD   3 mL at 08/26/17 0929  . sodium chloride flush (NS) 0.9 % injection 3 mL  3 mL Intravenous Q12H Maryellen Pile, MD   3 mL at 08/26/17 0928  . sodium chloride flush (NS) 0.9 % injection 3 mL  3 mL Intravenous PRN Maryellen Pile, MD         Objective: Vital signs in last 24 hours: Temp:  [98.5 F (36.9 C)-99 F (37.2 C)] 99 F (37.2 C) (02/03 0629) Pulse Rate:  [65-74] 65 (02/03 0629) Resp:  [18-20] 18 (02/03 0629) BP: (135-137)/(64-78) 136/78 (02/03 0629) SpO2:  [95 %-100 %] 95 % (02/03 0629) Weight:  [108.4 kg (238 lb 14.4 oz)] 108.4 kg (238 lb 14.4 oz) (02/03 0340)  Intake/Output from previous day: 02/02 0701 - 02/03 0700 In: 920 [P.O.:920] Out: 2070 [Urine:2070] Intake/Output this shift: No intake/output data recorded.   Physical Exam  Constitutional: He is oriented to person, place, and time.  Obese, WD in NAD  Abdominal: Soft. He exhibits mass (in the left inguinal area consistent with indurated edematous tissue. ). There is tenderness.  Genitourinary:  Genitourinary Comments: He has no change in the marked penile edema.  The scrotum is slightly less swollen but there is a persistent, slightly progressive eschar on the anterior scrotum extending from the anterior aspect of the wound to the base of the penis and to both lateral aspects of the scrotum.  There is no erythema, purulent drainage or crepitus.   The wound is clean and packed with a  dry overlying dressing. .     Musculoskeletal: Normal range of motion. He exhibits no edema.  Neurological: He is alert and oriented to person, place, and time.  Vitals reviewed.   Lab Results:  Recent Labs    08/25/17 0504 08/26/17 0801  WBC 12.0* 11.0*  HGB 10.0* 10.5*  HCT 30.4* 32.3*  PLT 373 424*   BMET Recent Labs    08/24/17 0526  NA 135  K 3.6  CL 100*  CO2 26  GLUCOSE 99  BUN 5*  CREATININE 0.79  CALCIUM 8.1*   PT/INR No results for input(s): LABPROT, INR in the last 72 hours. ABG No results for input(s): PHART, HCO3 in the last 72 hours.  Invalid input(s): PCO2, PO2  Studies/Results: No results found.   Assessment and Plan: Scrotal Edema with cellulitis with MRSA.  He has persistent induration and swelling in the left inguinal region in addition to the scrotum.  I am going to order a CT pelvis to make sure there is not fluid accumulation that might have developed and need further drainage.    Addendum:  CT pelvis today shows persistent tissue edema consistent with cellulitis that is more prominent in the left inguinal area than the right but there is nothing drainable.   Continue current therapy.    LOS: 11 days    Irine Seal 08/26/2017 [phone] ID: Spencer Robles, male   DOB: 01/08/1961, 57 y.o.   MRN: [id number]

## 2017-08-26 NOTE — Progress Notes (Signed)
Patient continues to refuse bed alarm.

## 2017-08-27 ENCOUNTER — Telehealth (INDEPENDENT_AMBULATORY_CARE_PROVIDER_SITE_OTHER): Payer: Self-pay | Admitting: *Deleted

## 2017-08-27 LAB — GLUCOSE, CAPILLARY
GLUCOSE-CAPILLARY: 165 mg/dL — AB (ref 65–99)
Glucose-Capillary: 131 mg/dL — ABNORMAL HIGH (ref 65–99)
Glucose-Capillary: 114 mg/dL — ABNORMAL HIGH (ref 65–99)

## 2017-08-27 MED ORDER — ENOXAPARIN SODIUM 60 MG/0.6ML ~~LOC~~ SOLN: 50.0000 mg | SUBCUTANEOUS | Status: DC

## 2017-08-27 MED ORDER — OXYCODONE HCL 5 MG PO TABS: 5.0000 mg | ORAL_TABLET | Freq: Four times a day (QID) | ORAL | 0 refills | Status: DC | PRN

## 2017-08-27 MED ORDER — DOXYCYCLINE HYCLATE 100 MG PO TABS: 100.0000 mg | ORAL_TABLET | Freq: Two times a day (BID) | ORAL | 0 refills | Status: AC

## 2017-08-27 MED ORDER — OXYCODONE HCL 5 MG PO TABS: 5.0000 mg | ORAL_TABLET | Freq: Four times a day (QID) | ORAL | 0 refills | Status: AC | PRN

## 2017-08-27 MED FILL — ?DOXYCYCLINE 100MG TABLET: 100 | 7 days supply | Qty: 14 | Fill #0

## 2017-08-27 NOTE — Clinical Social Work Note (Signed)
CSW spoke with Mudlogger of social work department regarding potential LOG for SNF for wound care. According to RN's since admission, patient is independent. Patient is not appropriate for LOG for SNF placement just for wet to dry dressings. MD notified.  CSW signing off. Consult again if any other social work needs arise.  Dayton Scrape, [place]

## 2017-08-27 NOTE — Progress Notes (Signed)
Patient was educated on how to do his dressing changes.  Patient not cooperative with doing his own dressing.  Patient kept stating "I can't do it, I just can't do it".  Nurse encouraged patient to try.  Patient kept stating that he couldn't reach the back of his scrotum to dress the wound.  Patient got upset and stated nurse forcing him to do something he cannot do.  Nurse cleaned and dressed the wound per order. Marcille Blanco, RN

## 2017-08-27 NOTE — Plan of Care (Signed)
  Nutrition: Adequate nutrition will be maintained 08/27/2017 0445 - Completed/Met by Evert Kohl, RN

## 2017-08-27 NOTE — Telephone Encounter (Signed)
Patient verified DOB Patient contacted the office during his admission to the hospital. Patient states he was treated for MRSA of the scrotum and feels as though he is not ready to be discharged. Patient was advised of needing to voice these concerns to the providers treating him.  Patients PCP will follow up with patient once discharged but he does not have the authority to override the hospitalist decisions. Patient advised to ask all questions and concerns prior to discharge. No further questions.

## 2017-08-27 NOTE — Care Management Note (Signed)
Case Management Note  Patient Details  Name: Spencer Robles MRN: [id number] Date of Birth: August 01, 1960  Subjective/Objective:   Scrotal Abscess                Action/Plan: Patent lives at home; he goes to the [facility] for Medical Care. Lots of encouragement given to patient to help do his dressing change; noted Urology note- dressing is wet to dry; bedside nurse to show pt how to do dressing change at home. CM will continue to follow for progression of care.  Expected Discharge Date:    08/27/2017              Expected Discharge Plan:   home/ self care  Status of Service:   Inpatient  Sherrilyn Rist [phone] 08/27/2017, 10:08 AM

## 2017-08-27 NOTE — Progress Notes (Signed)
   Subjective: Patient seen resting in his bed this morning. He continues to feel like he is improving and his pain remains controlled on his current regimen. He continues to experience a pulling sensation at his scrotum and left inguinal region. He has remained afebrile. Patient was informed that he would be discharged today to a facility to continue further care, as previously discussed. The patient expressed apprehension at being discharge as he felt he was comfortable with the care he has received and for him the care he would receive at a facility was unknown. He was informed that he is ready to go medically and that he would be able to talk with his social worker and be involved in selecting a facility. He remained apprehensive, but all questions answered.   Upon speaking with SW patient does not have insurance and does not qualify for a SNF. Patient will likely need to be discharged home with home health.  Objective:  Vital signs in last 24 hours: Vitals:   08/26/17 0930 08/26/17 1403 08/26/17 2204 08/27/17 0553  BP: 120/78 133/75 (!) 146/77 (!) 142/85  Pulse: 68 69 65   Resp: 18 20 20 20   Temp: 98 F (36.7 C) 99.1 F (37.3 C)  98.8 F (37.1 C)  TempSrc: Oral Oral  Oral  SpO2: 98% 96% 94% 98%  Weight:    229 lb 4.8 oz (104 kg)  Height:       Physical Exam  Constitutional: He is oriented to person, place, and time. He appears well-developed and well-nourished.  HENT:  Head: Normocephalic and atraumatic.  Eyes: EOM are normal. Right eye exhibits no discharge. Left eye exhibits no discharge.  Cardiovascular: Normal rate, regular rhythm, normal heart sounds and intact distal pulses.  Pulmonary/Chest: Effort normal and breath sounds normal. No respiratory distress.  Abdominal: Soft. Bowel sounds are normal. He exhibits no distension. There is no tenderness.  Genitourinary:  Genitourinary Comments: Improving penile and scrotal edema. Scrotum wrapped in dressing.  Musculoskeletal: He  exhibits no edema or deformity.  Neurological: He is alert and oriented to person, place, and time.  Skin: Skin is warm and dry.    Assessment/Plan:  Spencer Robles a 57 year old male with uncontrolled type 2 diabetes mellitus who presented to the emergency departmenton1/23with complaints of progressive scrotalpain and swelling for 2 days duration. Findings consistent with epididyoorchitis. S/P I&D on 1/29. Catheter removed 1/30 by urology. Wound cultures positive for MRSA. Patient has been afebrile since 1/30 except for 1 unexplained measurement of 104 on 2/2 (suspected typographical error). Leukocytosis improving  MRSA positive Scrotal cellulitis/Epididymoorchitis.  Improving. His swelling remians persistent. Afebrile with resolving leukocytosis. CT Pelvis on 08/26/17 showed persistent tissue edema, more prominent in the left inguinal region, consistent with cellulitis; no fluid collection. Urology has cleared for discharge. Patient would prefer SNF over [facility] wound care.  - SW stated he is unlikely to qualify for SNF - Continue doxycycline. (Day 12 of AB). Will continue through 09/03/17 - Continue pain management with oxycodone. - Appreciate urology recommendations. - PT eval  Diabetes mellitus. Stable.  CBG between 111-163 over the past 24-hour. - ContinueLantus to 30 units QHS and resistant SSI.  Dispo: Anticipated discharge later today.  Neva Seat, MD 08/27/2017, 6:45 AM [phone]

## 2017-08-27 NOTE — Progress Notes (Signed)
Patient ID: Marquese Burkland, male   DOB: 1960-09-20, 57 y.o.   MRN: [id number]  6 Days Post-Op Subjective: Pt with one high fever over the weekend.  CT scan yesterday with no further abscess formation.  WBC has continued to improve.  Objective: Vital signs in last 24 hours: Temp:  [98 F (36.7 C)-99.1 F (37.3 C)] 98.8 F (37.1 C) (02/04 0553) Pulse Rate:  [65-69] 65 (02/03 2204) Resp:  [18-20] 20 (02/04 0553) BP: (120-146)/(75-85) 142/85 (02/04 0553) SpO2:  [94 %-98 %] 98 % (02/04 0553) Weight:  [104 kg (229 lb 4.8 oz)] 104 kg (229 lb 4.8 oz) (02/04 0553)  Intake/Output from previous day: 02/03 0701 - 02/04 0700 In: 1076 [P.O.:956] Out: 3650 [Urine:3650] Intake/Output this shift: Total I/O In: 356 [P.O.:236; Other:120] Out: 1950 [Urine:1950]  Physical Exam:  General: Alert and oriented GU: Edema and erythema definitely continuing to improve, dressing intact, skin changes noted as before    Lab Results: Recent Labs    08/25/17 0504 08/26/17 0801  HGB 10.0* 10.5*  HCT 30.4* 32.3*   . CBC Latest Ref Rng & Units 08/26/2017 08/25/2017 08/24/2017  WBC 4.0 - 10.5 K/uL 11.0(H) 12.0(H) 13.8(H)  Hemoglobin 13.0 - 17.0 g/dL 10.5(L) 10.0(L) 10.2(L)  Hematocrit 39.0 - 52.0 % 32.3(L) 30.4(L) 31.3(L)  Platelets 150 - 400 K/uL 424(H) 373 334     Studies/Results: Ct Pelvis Wo Contrast  Result Date: 08/26/2017 CLINICAL DATA:  Status post I&D of scrotal abscess 5 days ago, residual induration, tenderness and swelling to the left inguinal/scrotal region EXAM: CT PELVIS WITHOUT CONTRAST TECHNIQUE: Multidetector CT imaging of the pelvis was performed following the standard protocol without intravenous contrast. COMPARISON:  08/18/2017 FINDINGS: Urinary Tract:  Thick-walled bladder. Bowel:  Visualized bowel is unremarkable. Vascular/Lymphatic: No evidence of aneurysm. No suspicious pelvic lymphadenopathy. Reproductive:  Prostate is notable for dystrophic calcifications. Other:  No pelvic ascites.  Mild subcutaneous stranding/edema in the left greater than right inguinal/perineal region (series 4/image 118), extending to the scrotum, suggesting cellulitis. Mild soft tissue gas in the left hemiscrotum (series 4/image 162) with postsurgical changes and dressing posteriorly (series 4/image 182). No drainable fluid collection/abscess is evident on CT. Musculoskeletal: Mild degenerative changes of the lower thoracic spine. Sclerosis involving the bilateral sacroiliac joints. Sclerotic lesion in the left parasymphyseal region (series 5/image 100), likely reflecting a benign bone island. IMPRESSION: No drainable fluid collection/abscess is evident on CT. Mild subcutaneous stranding/edema in the left greater than right inguinal/perineal region, extending to the scrotum, suggesting cellulitis. Postsurgical changes involving the scrotum. Electronically Signed   By: Julian Hy M.D.   On: 08/26/2017 12:24    Assessment/Plan: Scrotal abscess - Continue doxycycline for about one more week.  Pt can be discharged with BID wet to dry saline dressing changes in outpatient setting.  I will arrange f/u to check wound in about 10-14 days.   LOS: 12 days   Zelia Yzaguirre,LES 08/27/2017, 6:56 AM

## 2017-08-27 NOTE — Evaluation (Signed)
Physical Therapy Evaluation Patient Details Name: Spencer Robles MRN: [id number] DOB: 07/20/1961 Today's Date: 08/27/2017   History of Present Illness  56yo male presenting to the ED with compliants of progressive scrotal pain and swelling, diagnosed with L scrotal edema secondary to likely epydidymitis, AKI. Received I&D to scrotum and cystoscopy 08/21/17. 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 He is able to complete functional bed mobility with Mod(I) and extra time, also able to complete functional transfers and gait with RW and S today. He was able to perform self care in the bathroom with distant S from PT for safety. Behavior and interaction with PT all WFL today, however patient does demonstrate strange behaviors such as taking entire hospital gown off to use the bathroom. At this point/per his report, he appears to generally be at his baseline level of function in terms of mobility, gait, and balance and does not appear to be in need of skilled PT services at this point; note he may benefit from general supervision at home moving forward however. He was left seated at the edge of the bed, all needs met and questions/concerns otherwise addressed this afternoon. PT signing off for now, thank you for the referral.     Follow Up Recommendations No PT follow up;Supervision/Assistance - 24 hour    Equipment Recommendations  None recommended by PT    Recommendations for Other Services       Precautions / Restrictions Precautions Precautions: Fall Restrictions Weight Bearing Restrictions: No      Mobility  Bed Mobility Overal bed mobility: Modified Independent             General bed mobility comments: increased time and effort   Transfers Overall transfer level: Needs assistance Equipment used: Rolling walker (2 wheeled) Transfers: Sit to/from Stand Sit to Stand:  Supervision         General transfer comment: S for safety   Ambulation/Gait Ambulation/Gait assistance: Supervision Ambulation Distance (Feet): 30 Feet Assistive device: Rolling walker (2 wheeled) Gait Pattern/deviations: Trunk flexed;Decreased step length - right;Decreased step length - left;Step-through pattern     General Gait Details: S for gait with walker, per patient he is generally at his baseline in terms of mobility   Stairs            Wheelchair Mobility    Modified Rankin (Stroke Patients Only)       Balance Overall balance assessment: Needs assistance Sitting-balance support: Bilateral upper extremity supported;Feet supported Sitting balance-Leahy Scale: Good     Standing balance support: No upper extremity supported;During functional activity Standing balance-Leahy Scale: Good Standing balance comment: able to perform toileting with distant S from PT, no device                              Pertinent Vitals/Pain Pain Assessment: No/denies pain    Home Living Family/patient expects to be discharged to:: Private residence Living Arrangements: Alone Available Help at Discharge: Family;Friend(s);Available PRN/intermittently Type of Home: Apartment Home Access: Level entry     Home Layout: One level Home Equipment: Walker - 2 wheels Additional Comments: pt lives alone and mentioned possible church members but has not called anyone to ask at this time. pt requires using a laundry mat to wash clothing and currenlty sleeps on air mattress without sheets.     Prior Function Level   of Independence: Independent         Comments: Works loading trucks and as cashier at [employer]     Hand Dominance        Extremity/Trunk Assessment   Upper Extremity Assessment Upper Extremity Assessment: Overall WFL for tasks assessed    Lower Extremity Assessment Lower Extremity Assessment: Overall WFL for tasks assessed    Cervical /  Trunk Assessment Cervical / Trunk Assessment: Normal  Communication   Communication: No difficulties  Cognition Arousal/Alertness: Awake/alert Behavior During Therapy: Anxious Overall Cognitive Status: Within Functional Limits for tasks assessed                                 General Comments: patient WFL for behaviors during interactions, but has strange behavniors such such as taking entire hospital gown off to go to the bathroom       General Comments General comments (skin integrity, edema, etc.): per patient's report he generally appears to be at his baseline level of mobility however demonstrates some strange behaviors such as taking entire hospital gown off to go to the bathroom     Exercises     Assessment/Plan    PT Assessment Patent does not need any further PT services  PT Problem List         PT Treatment Interventions      PT Goals (Current goals can be found in the Care Plan section)  Acute Rehab PT Goals Patient Stated Goal: to go to SNF  PT Goal Formulation: With patient Time For Goal Achievement: 09/10/17 Potential to Achieve Goals: Fair    Frequency     Barriers to discharge        Co-evaluation               AM-PAC PT "6 Clicks" Daily Activity  Outcome Measure Difficulty turning over in bed (including adjusting bedclothes, sheets and blankets)?: None Difficulty moving from lying on back to sitting on the side of the bed? : None Difficulty sitting down on and standing up from a chair with arms (e.g., wheelchair, bedside commode, etc,.)?: None Help needed moving to and from a bed to chair (including a wheelchair)?: None Help needed walking in hospital room?: None Help needed climbing 3-5 steps with a railing? : A Little 6 Click Score: 23    End of Session Equipment Utilized During Treatment: Gait belt Activity Tolerance: Patient tolerated treatment well Patient left: in bed;with call bell/phone within reach Nurse  Communication: Mobility status PT Visit Diagnosis: Other abnormalities of gait and mobility (R26.89);Difficulty in walking, not elsewhere classified (R26.2)    Time: 1325-1338 PT Time Calculation (min) (ACUTE ONLY): 13 min   Charges:   PT Evaluation $PT Eval Low Complexity: 1 Low     PT G Codes:        Kristen Unger PT, DPT, CBIS  Supplemental Physical Therapist [place]   Pager [phone]    

## 2017-08-29 ENCOUNTER — Inpatient Hospital Stay (HOSPITAL_COMMUNITY)
Admission: EM | Admit: 2017-08-29 | Discharge: 2017-09-04 | DRG: 581 | Disposition: A | Discharge status: Skilled Nursing Facility | Payer: Self-pay | Attending: Internal Medicine | Admitting: Internal Medicine

## 2017-08-29 ENCOUNTER — Encounter (HOSPITAL_COMMUNITY): Payer: Self-pay | Admitting: *Deleted

## 2017-08-29 ENCOUNTER — Emergency Department (HOSPITAL_COMMUNITY): Payer: Self-pay

## 2017-08-29 DIAGNOSIS — R369 Urethral discharge, unspecified: Secondary | ICD-10-CM | POA: Diagnosis present

## 2017-08-29 DIAGNOSIS — L988 Other specified disorders of the skin and subcutaneous tissue: Secondary | ICD-10-CM

## 2017-08-29 DIAGNOSIS — E1165 Type 2 diabetes mellitus with hyperglycemia: Secondary | ICD-10-CM | POA: Diagnosis present

## 2017-08-29 DIAGNOSIS — L089 Local infection of the skin and subcutaneous tissue, unspecified: Principal | ICD-10-CM | POA: Diagnosis present

## 2017-08-29 DIAGNOSIS — Z79899 Other long term (current) drug therapy: Secondary | ICD-10-CM

## 2017-08-29 DIAGNOSIS — N492 Inflammatory disorders of scrotum: Secondary | ICD-10-CM | POA: Diagnosis present

## 2017-08-29 DIAGNOSIS — I96 Gangrene, not elsewhere classified: Secondary | ICD-10-CM

## 2017-08-29 DIAGNOSIS — Z794 Long term (current) use of insulin: Secondary | ICD-10-CM

## 2017-08-29 DIAGNOSIS — L0291 Cutaneous abscess, unspecified: Secondary | ICD-10-CM | POA: Diagnosis present

## 2017-08-29 DIAGNOSIS — B9562 Methicillin resistant Staphylococcus aureus infection as the cause of diseases classified elsewhere: Secondary | ICD-10-CM | POA: Diagnosis present

## 2017-08-29 LAB — CBC WITH DIFFERENTIAL/PLATELET
BASOS ABS: 0.1 10*3/uL (ref 0.0–0.1)
BASOS PCT: 1 %
EOS ABS: 0.1 10*3/uL (ref 0.0–0.7)
Eosinophils Relative: 1 %
HCT: 36.5 % — ABNORMAL LOW (ref 39.0–52.0)
HEMOGLOBIN: 11.8 g/dL — AB (ref 13.0–17.0)
Lymphocytes Relative: 22 %
Lymphs Abs: 2.8 10*3/uL (ref 0.7–4.0)
MCH: 27.6 pg (ref 26.0–34.0)
MCHC: 32.3 g/dL (ref 30.0–36.0)
MCV: 85.3 fL (ref 78.0–100.0)
Monocytes Absolute: 0.7 10*3/uL (ref 0.1–1.0)
Monocytes Relative: 6 %
NEUTROS PCT: 70 %
Neutro Abs: 8.9 10*3/uL — ABNORMAL HIGH (ref 1.7–7.7)
Platelets: 572 10*3/uL — ABNORMAL HIGH (ref 150–400)
RBC: 4.28 MIL/uL (ref 4.22–5.81)
RDW: 14.6 % (ref 11.5–15.5)
WBC: 12.6 10*3/uL — AB (ref 4.0–10.5)

## 2017-08-29 LAB — BASIC METABOLIC PANEL
ANION GAP: 6 (ref 5–15)
BUN: 12 mg/dL (ref 6–20)
CHLORIDE: 100 mmol/L — AB (ref 101–111)
CO2: 31 mmol/L (ref 22–32)
CREATININE: 0.8 mg/dL (ref 0.61–1.24)
Calcium: 9.1 mg/dL (ref 8.9–10.3)
GFR calc non Af Amer: >60 mL/min (ref >60)
Glucose, Bld: 96 mg/dL (ref 65–99)
Potassium: 4.4 mmol/L (ref 3.5–5.1)
SODIUM: 137 mmol/L (ref 135–145)

## 2017-08-29 NOTE — ED Provider Notes (Addendum)
[facility] DEPT Provider Note   CSN: [id number] Arrival date & time: 08/29/17  1230     History   Chief Complaint Chief Complaint  Patient presents with  . Penile Discharge    HPI Spencer Robles is a 57 y.o. male.  Presents with purulent drainage from scrotum for the past 4 days with mild to moderate pain at scrotum.  He denies having any difficulty urinating.  Denies fever.  Treated with doxycycline.  Nothing makes symptoms better or worse.  He denies fever.  HPI  Past Medical History:  Diagnosis Date  . Diabetes mellitus without complication ([place])     There are no active problems to display for this patient.   Patient underwent incision and drainage of scrotum 08/21/2017.  Discharged 08/27/2017     Home Medications   Doxycycline, oxycodone Prior to Admission medications   Not on File    Family History No family history on file.  Social History Social History   Tobacco Use  . Smoking status: Not on file  Substance Use Topics  . Alcohol use: Not on file  . Drug use: Not on file   No tobacco no alcohol no drugs  Allergies   Patient has no allergy information on record.   Review of Systems Review of Systems  Musculoskeletal: Positive for gait problem.       Walks with cane  Skin: Positive for wound.  Allergic/Immunologic: Positive for immunocompromised state.       Diabetic     Physical Exam Updated Vital Signs BP (!) 171/95 (BP Location: Left Arm)   Pulse 89   Temp 98.6 F (37 C) (Oral)   Resp 20   SpO2 99%   Physical Exam  Constitutional: He appears well-developed and well-nourished. No distress.  HENT:  Head: Normocephalic and atraumatic.  Eyes: Conjunctivae are normal. Pupils are equal, round, and reactive to light.  Neck: Neck supple. No tracheal deviation present. No thyromegaly present.  Cardiovascular: Normal rate and regular rhythm.  No murmur heard. Pulmonary/Chest: Effort normal and breath sounds  normal.  Abdominal: Soft. Bowel sounds are normal. He exhibits no distension. There is no tenderness.  Genitourinary:  Genitourinary Comments: Genitalia penis and foreskin and scrotum markedly swollen.  There is clean appearing open wound at the base of the scrotum.  There is a 2 cm pustular lesion with thickened necrotic eschar on the scrotum.  Glans penis is not well visualized due to swelling of foreskin  Musculoskeletal: Normal range of motion. He exhibits no edema or tenderness.  Neurological: He is alert. Coordination normal.  Skin: Skin is warm and dry. No rash noted.  Psychiatric: He has a normal mood and affect.  Nursing note and vitals reviewed.    ED Treatments / Results  Labs (all labs ordered are listed, but only abnormal results are displayed) Labs Reviewed  CBC WITH DIFFERENTIAL/PLATELET - Abnormal; Notable for the following components:      Result Value   WBC 12.6 (*)    Hemoglobin 11.8 (*)    HCT 36.5 (*)    Platelets 572 (*)    Neutro Abs 8.9 (*)    All other components within normal limits  BASIC METABOLIC PANEL - Abnormal; Notable for the following components:   Chloride 100 (*)    All other components within normal limits  URINE CULTURE  URINALYSIS, ROUTINE W REFLEX MICROSCOPIC   Results for orders placed or performed during the hospital encounter of 08/29/17  CBC with Differential  Result Value Ref Range   WBC 12.6 (H) 4.0 - 10.5 K/uL   RBC 4.28 4.22 - 5.81 MIL/uL   Hemoglobin 11.8 (L) 13.0 - 17.0 g/dL   HCT 36.5 (L) 39.0 - 52.0 %   MCV 85.3 78.0 - 100.0 fL   MCH 27.6 26.0 - 34.0 pg   MCHC 32.3 30.0 - 36.0 g/dL   RDW 14.6 11.5 - 15.5 %   Platelets 572 (H) 150 - 400 K/uL   Neutrophils Relative % 70 %   Neutro Abs 8.9 (H) 1.7 - 7.7 K/uL   Lymphocytes Relative 22 %   Lymphs Abs 2.8 0.7 - 4.0 K/uL   Monocytes Relative 6 %   Monocytes Absolute 0.7 0.1 - 1.0 K/uL   Eosinophils Relative 1 %   Eosinophils Absolute 0.1 0.0 - 0.7 K/uL   Basophils Relative  1 %   Basophils Absolute 0.1 0.0 - 0.1 K/uL  Basic metabolic panel  Result Value Ref Range   Sodium 137 135 - 145 mmol/L   Potassium 4.4 3.5 - 5.1 mmol/L   Chloride 100 (L) 101 - 111 mmol/L   CO2 31 22 - 32 mmol/L   Glucose, Bld 96 65 - 99 mg/dL   BUN 12 6 - 20 mg/dL   Creatinine, Ser 0.80 0.61 - 1.24 mg/dL   Calcium 9.1 8.9 - 10.3 mg/dL   GFR calc non Af Amer >60 >60 mL/min   GFR calc Af Amer >60 >60 mL/min   Anion gap 6 5 - 15   US Scrotum W/doppler  Result Date: 08/29/2017 CLINICAL DATA:  Left testicular swelling for several days EXAM: SCROTAL ULTRASOUND DOPPLER ULTRASOUND OF THE TESTICLES TECHNIQUE: Complete ultrasound examination of the testicles, epididymis, and other scrotal structures was performed. Color and spectral Doppler ultrasound were also utilized to evaluate blood flow to the testicles. COMPARISON:  08/15/2017 FINDINGS: Right testicle Measurements: 4.0 x 1.8 x 3.7 cm. Previously seen heterogeneity of the right testicle is not as well appreciated on today's exam. Improved vascularity is noted. No focal mass is seen. Left testicle Measurements: 4.0 x 1.8 x 3.9 cm. No mass or microlithiasis visualized. Right epididymis:  Normal in size and appearance. Left epididymis:  Normal in size and appearance. Hydrocele: Right-sided hydrocele is noted increased from the prior exam. Varicocele:  None visualized. Pulsed Doppler interrogation of both testes demonstrates normal low resistance arterial and venous waveforms bilaterally. Scrotal wall swelling is noted consistent with the known history of prior scrotal abscess and drainage. IMPRESSION: Resolution of previously seen heterogeneity within the right testicle. Normal-appearing left testicle. Scrotal wall thickening consistent with the known inflammatory changes and recent scrotal abscess drainage. Right-sided hydrocele.  This is increased from the prior exam. Electronically Signed   By: Inez Catalina M.D.   On: 08/29/2017 15:26   EKG  EKG  Interpretation None       Radiology US Scrotum W/doppler  Result Date: 08/29/2017 CLINICAL DATA:  Left testicular swelling for several days EXAM: SCROTAL ULTRASOUND DOPPLER ULTRASOUND OF THE TESTICLES TECHNIQUE: Complete ultrasound examination of the testicles, epididymis, and other scrotal structures was performed. Color and spectral Doppler ultrasound were also utilized to evaluate blood flow to the testicles. COMPARISON:  08/15/2017 FINDINGS: Right testicle Measurements: 4.0 x 1.8 x 3.7 cm. Previously seen heterogeneity of the right testicle is not as well appreciated on today's exam. Improved vascularity is noted. No focal mass is seen. Left testicle Measurements: 4.0 x 1.8 x 3.9 cm. No mass or microlithiasis visualized. Right epididymis:  Normal in size and appearance. Left epididymis:  Normal in size and appearance. Hydrocele: Right-sided hydrocele is noted increased from the prior exam. Varicocele:  None visualized. Pulsed Doppler interrogation of both testes demonstrates normal low resistance arterial and venous waveforms bilaterally. Scrotal wall swelling is noted consistent with the known history of prior scrotal abscess and drainage. IMPRESSION: Resolution of previously seen heterogeneity within the right testicle. Normal-appearing left testicle. Scrotal wall thickening consistent with the known inflammatory changes and recent scrotal abscess drainage. Right-sided hydrocele.  This is increased from the prior exam. Electronically Signed   By: Inez Catalina M.D.   On: 08/29/2017 15:26    Procedures Procedures (including critical care time)  Medications Ordered in ED Medications - No data to display   Initial Impression / Assessment and Plan / ED Course  I have reviewed the triage vital signs and the nursing notes.  Pertinent labs & imaging results that were available during my care of the patient were reviewed by me and considered in my medical decision making (see chart for  details).     I consulted Dr. Diona Fanti who will come and evaluate patient in the ED Dr. Diona Fanti saw patient in the emergency department and plans on taking him to the operating room tomorrow for debridement.  He requests admission to hospitalist service for management of medical conditions.  Hospitalist service Dr Alcario Drought consulted by me.  And will arrange for overnight stay Final Clinical Impressions(s) / ED Diagnoses  Dx necrotic eschar of scrotum Final diagnoses:  None    ED Discharge Orders    None       Orlie Dakin, MD 08/30/17 Shelah Lewandowsky    Orlie Dakin, MD 08/30/17 0030

## 2017-08-29 NOTE — ED Triage Notes (Signed)
Per EMS, pt noticed white splotchy substance on the head of his penis. Pt has swelling to his scrotum, is currently on doxycycline.   BP 130/80 HR 84 RR 18 CBG 160

## 2017-08-29 NOTE — ED Triage Notes (Signed)
Pt transported to [place] per [place]

## 2017-08-30 ENCOUNTER — Other Ambulatory Visit: Payer: Self-pay

## 2017-08-30 ENCOUNTER — Inpatient Hospital Stay (HOSPITAL_COMMUNITY): Payer: Self-pay | Admitting: Anesthesiology

## 2017-08-30 ENCOUNTER — Encounter (HOSPITAL_COMMUNITY): Payer: Self-pay | Admitting: *Deleted

## 2017-08-30 ENCOUNTER — Encounter (HOSPITAL_COMMUNITY)
Admission: EM | Disposition: A | Discharge status: Skilled Nursing Facility | Payer: Self-pay | Source: Home / Self Care | Attending: Internal Medicine

## 2017-08-30 DIAGNOSIS — L0291 Cutaneous abscess, unspecified: Secondary | ICD-10-CM | POA: Diagnosis present

## 2017-08-30 DIAGNOSIS — N492 Inflammatory disorders of scrotum: Secondary | ICD-10-CM | POA: Diagnosis present

## 2017-08-30 HISTORY — PX: SCROTAL EXPLORATION: SHX2386

## 2017-08-30 LAB — URINALYSIS, ROUTINE W REFLEX MICROSCOPIC
BILIRUBIN URINE: NEGATIVE
Glucose, UA: NEGATIVE mg/dL
Ketones, ur: NEGATIVE mg/dL
NITRITE: NEGATIVE
PROTEIN: NEGATIVE mg/dL
Specific Gravity, Urine: 1.019 (ref 1.005–1.030)
pH: 5 (ref 5.0–8.0)

## 2017-08-30 LAB — GLUCOSE, CAPILLARY
GLUCOSE-CAPILLARY: 105 mg/dL — AB (ref 65–99)
GLUCOSE-CAPILLARY: 265 mg/dL — AB (ref 65–99)
GLUCOSE-CAPILLARY: 76 mg/dL (ref 65–99)
GLUCOSE-CAPILLARY: 82 mg/dL (ref 65–99)
Glucose-Capillary: 108 mg/dL — ABNORMAL HIGH (ref 65–99)
Glucose-Capillary: 118 mg/dL — ABNORMAL HIGH (ref 65–99)
Glucose-Capillary: 108 mg/dL — ABNORMAL HIGH (ref 65–99)
Glucose-Capillary: 83 mg/dL (ref 65–99)
Glucose-Capillary: 287 mg/dL — ABNORMAL HIGH (ref 65–99)

## 2017-08-30 SURGERY — EXPLORATION, SCROTUM
Anesthesia: General

## 2017-08-30 MED ORDER — BUPIVACAINE-EPINEPHRINE 0.25% -1:200000 IJ SOLN
INTRAMUSCULAR | Status: AC
  Filled 2017-08-30: qty 1

## 2017-08-30 MED ORDER — BUPIVACAINE HCL (PF) 0.25 % IJ SOLN
INTRAMUSCULAR | Status: AC
  Filled 2017-08-30: qty 30

## 2017-08-30 MED ORDER — HYDROMORPHONE HCL 1 MG/ML IJ SOLN
0.2500 mg | INTRAMUSCULAR | Status: DC | PRN
  Administered 2017-08-30: 0.5 mg via INTRAVENOUS
  Administered 2017-08-30 (×2): 0.25 mg via INTRAVENOUS

## 2017-08-30 MED ORDER — MEPERIDINE HCL 50 MG/ML IJ SOLN: 6.2500 mg | INTRAMUSCULAR | Status: DC | PRN

## 2017-08-30 MED ORDER — PIPERACILLIN-TAZOBACTAM 3.375 G IVPB 30 MIN
3.3750 g | Freq: Once | INTRAVENOUS | Status: AC
Start: 2017-08-30 — End: 2017-08-30
  Administered 2017-08-30: 3.375 g via INTRAVENOUS
  Filled 2017-08-30 (×2): qty 50

## 2017-08-30 MED ORDER — FENTANYL CITRATE (PF) 250 MCG/5ML IJ SOLN
INTRAMUSCULAR | Status: AC
  Filled 2017-08-30: qty 5

## 2017-08-30 MED ORDER — MIDAZOLAM HCL 2 MG/2ML IJ SOLN
INTRAMUSCULAR | Status: AC
  Filled 2017-08-30: qty 2

## 2017-08-30 MED ORDER — PROPOFOL 10 MG/ML IV BOLUS
INTRAVENOUS | Status: AC
  Filled 2017-08-30: qty 20

## 2017-08-30 MED ORDER — INSULIN ASPART 100 UNIT/ML ~~LOC~~ SOLN
0.0000 [IU] | SUBCUTANEOUS | Status: DC
  Administered 2017-08-30: 5 [IU] via SUBCUTANEOUS
  Administered 2017-08-31 (×2): 2 [IU] via SUBCUTANEOUS
  Administered 2017-08-31: 5 [IU] via SUBCUTANEOUS
  Administered 2017-08-31: 2 [IU] via SUBCUTANEOUS
  Administered 2017-08-31 (×2): 1 [IU] via SUBCUTANEOUS
  Administered 2017-09-01 (×2): 2 [IU] via SUBCUTANEOUS
  Administered 2017-09-01: 1 [IU] via SUBCUTANEOUS

## 2017-08-30 MED ORDER — VANCOMYCIN HCL 10 G IV SOLR
1250.0000 mg | Freq: Two times a day (BID) | INTRAVENOUS | Status: DC
  Administered 2017-08-30 – 2017-08-31 (×2): 1250 mg via INTRAVENOUS
  Filled 2017-08-30 (×2): qty 1250

## 2017-08-30 MED ORDER — OXYCODONE HCL 5 MG PO TABS
5.0000 mg | ORAL_TABLET | ORAL | Status: DC | PRN
  Administered 2017-08-30 – 2017-08-31 (×2): 5 mg via ORAL
  Administered 2017-09-01 – 2017-09-03 (×9): 10 mg via ORAL
  Filled 2017-08-30: qty 2
  Filled 2017-08-30: qty 1
  Filled 2017-08-30 (×2): qty 2
  Filled 2017-08-30: qty 1
  Filled 2017-08-30 (×6): qty 2

## 2017-08-30 MED ORDER — PROPOFOL 10 MG/ML IV BOLUS
INTRAVENOUS | Status: DC | PRN
  Administered 2017-08-30: 200 mg via INTRAVENOUS

## 2017-08-30 MED ORDER — ONDANSETRON HCL 4 MG/2ML IJ SOLN
INTRAMUSCULAR | Status: DC | PRN
  Administered 2017-08-30: 4 mg via INTRAVENOUS

## 2017-08-30 MED ORDER — FENTANYL CITRATE (PF) 100 MCG/2ML IJ SOLN
INTRAMUSCULAR | Status: DC | PRN
  Administered 2017-08-30: 50 ug via INTRAVENOUS
  Administered 2017-08-30: 25 ug via INTRAVENOUS

## 2017-08-30 MED ORDER — PIPERACILLIN-TAZOBACTAM 3.375 G IVPB
3.3750 g | Freq: Three times a day (TID) | INTRAVENOUS | Status: DC
  Administered 2017-08-30 – 2017-08-31 (×4): 3.375 g via INTRAVENOUS
  Filled 2017-08-30 (×4): qty 50

## 2017-08-30 MED ORDER — ONDANSETRON HCL 4 MG/2ML IJ SOLN: 4.0000 mg | Freq: Once | INTRAMUSCULAR | Status: DC | PRN

## 2017-08-30 MED ORDER — HYDROMORPHONE HCL 1 MG/ML IJ SOLN
INTRAMUSCULAR | Status: AC
  Filled 2017-08-30: qty 1

## 2017-08-30 MED ORDER — DEXAMETHASONE SODIUM PHOSPHATE 10 MG/ML IJ SOLN
INTRAMUSCULAR | Status: DC | PRN
  Administered 2017-08-30: 5 mg via INTRAVENOUS

## 2017-08-30 MED ORDER — HYDROMORPHONE HCL 1 MG/ML IJ SOLN
1.0000 mg | Freq: Once | INTRAMUSCULAR | Status: AC
  Administered 2017-08-30: 1 mg via INTRAVENOUS
  Filled 2017-08-30: qty 1

## 2017-08-30 MED ORDER — ONDANSETRON HCL 4 MG/2ML IJ SOLN
INTRAMUSCULAR | Status: AC
  Filled 2017-08-30: qty 2

## 2017-08-30 MED ORDER — 0.9 % SODIUM CHLORIDE (POUR BTL) OPTIME
TOPICAL | Status: DC | PRN
  Administered 2017-08-30: 1000 mL

## 2017-08-30 MED ORDER — VANCOMYCIN HCL 10 G IV SOLR
2000.0000 mg | Freq: Once | INTRAVENOUS | Status: AC
  Administered 2017-08-30: 2000 mg via INTRAVENOUS
  Filled 2017-08-30: qty 2000

## 2017-08-30 MED ORDER — MIDAZOLAM HCL 5 MG/5ML IJ SOLN
INTRAMUSCULAR | Status: DC | PRN
  Administered 2017-08-30: 2 mg via INTRAVENOUS

## 2017-08-30 MED ORDER — LIDOCAINE HCL (CARDIAC) 20 MG/ML IV SOLN
INTRAVENOUS | Status: DC | PRN
  Administered 2017-08-30: 100 mg via INTRAVENOUS

## 2017-08-30 MED ORDER — SODIUM CHLORIDE 0.9 % IV SOLN
INTRAVENOUS | Status: DC
  Administered 2017-08-30 – 2017-09-01 (×3): via INTRAVENOUS

## 2017-08-30 MED ORDER — INSULIN GLARGINE 100 UNIT/ML ~~LOC~~ SOLN
10.0000 [IU] | Freq: Every day | SUBCUTANEOUS | Status: DC
  Administered 2017-08-30 – 2017-09-02 (×5): 10 [IU] via SUBCUTANEOUS
  Filled 2017-08-30 (×6): qty 0.1

## 2017-08-30 MED ORDER — SODIUM CHLORIDE 0.9 % IR SOLN
Status: DC | PRN
  Administered 2017-08-30: 3000 mL

## 2017-08-30 MED ORDER — VANCOMYCIN HCL IN DEXTROSE 1-5 GM/200ML-% IV SOLN
1000.0000 mg | Freq: Once | INTRAVENOUS | Status: DC
  Administered 2017-08-30: 1000 mg via INTRAVENOUS
  Filled 2017-08-30: qty 200

## 2017-08-30 MED ORDER — LACTATED RINGERS IV SOLN
INTRAVENOUS | Status: DC
  Administered 2017-08-30: 1000 mL via INTRAVENOUS

## 2017-08-30 SURGICAL SUPPLY — 32 items
BAG URO CATCHER STRL LF (MISCELLANEOUS) ×2 IMPLANT
BLADE 15 SAFETY STRL DISP (BLADE) ×2 IMPLANT
BLADE HEX COATED 2.75 (ELECTRODE) IMPLANT
BNDG GAUZE ELAST 4 BULKY (GAUZE/BANDAGES/DRESSINGS) ×2 IMPLANT
CATH INTERMIT  6FR 70CM (CATHETERS) IMPLANT
CLOTH BEACON ORANGE TIMEOUT ST (SAFETY) IMPLANT
COVER FOOTSWITCH UNIV (MISCELLANEOUS) IMPLANT
COVER SURGICAL LIGHT HANDLE (MISCELLANEOUS) ×2 IMPLANT
ELECT PENCIL ROCKER SW 15FT (MISCELLANEOUS) ×2 IMPLANT
ELECT REM PT RETURN 15FT ADLT (MISCELLANEOUS) ×2 IMPLANT
GAUZE SPONGE 4X4 12PLY STRL (GAUZE/BANDAGES/DRESSINGS) ×2 IMPLANT
GLOVE BIOGEL M STRL SZ7.5 (GLOVE) ×2 IMPLANT
GOWN STRL REUS W/TWL LRG LVL3 (GOWN DISPOSABLE) ×2 IMPLANT
GUIDEWIRE STR DUAL SENSOR (WIRE) IMPLANT
KIT BASIN OR (CUSTOM PROCEDURE TRAY) ×2 IMPLANT
MANIFOLD NEPTUNE II (INSTRUMENTS) ×2 IMPLANT
NEEDLE HYPO 22GX1.5 SAFETY (NEEDLE) IMPLANT
NS IRRIG 1000ML POUR BTL (IV SOLUTION) ×2 IMPLANT
PACK CYSTO (CUSTOM PROCEDURE TRAY) ×2 IMPLANT
PACK GENERAL/GYN (CUSTOM PROCEDURE TRAY) IMPLANT
PAD ABD 8X10 STRL (GAUZE/BANDAGES/DRESSINGS) ×4 IMPLANT
SPONGE LAP 18X18 X RAY DECT (DISPOSABLE) ×4 IMPLANT
SUPPORT SCROTAL LG STRP (MISCELLANEOUS) IMPLANT
SUT CHROMIC 2 0 SH (SUTURE) ×2 IMPLANT
SUT CHROMIC 3 0 SH 27 (SUTURE) IMPLANT
SUT VIC AB 2-0 UR5 27 (SUTURE) IMPLANT
SUT VICRYL 0 TIES 12 18 (SUTURE) IMPLANT
SYR CONTROL 10ML LL (SYRINGE) IMPLANT
TOWEL OR 17X26 10 PK STRL BLUE (TOWEL DISPOSABLE) ×2 IMPLANT
TUBING CONNECTING 10 (TUBING) ×2 IMPLANT
WATER STERILE IRR 1000ML POUR (IV SOLUTION) IMPLANT
YANKAUER SUCT BULB TIP NO VENT (SUCTIONS) ×2 IMPLANT

## 2017-08-30 NOTE — Anesthesia Preprocedure Evaluation (Signed)
Anesthesia Evaluation  Patient identified by MRN, date of birth, ID band Patient awake    Reviewed: Allergy & Precautions, NPO status , Patient's Chart, lab work & pertinent test results  Airway Mallampati: I  TM Distance: >3 FB Neck ROM: Full    Dental   Pulmonary    Pulmonary exam normal        Cardiovascular Normal cardiovascular exam     Neuro/Psych    GI/Hepatic   Endo/Other  diabetes, Well Controlled, Type 2, Insulin Dependent  Renal/GU      Musculoskeletal   Abdominal   Peds  Hematology   Anesthesia Other Findings   Reproductive/Obstetrics                             Anesthesia Physical Anesthesia Plan  ASA: II  Anesthesia Plan: General   Post-op Pain Management:    Induction: Intravenous  PONV Risk Score and Plan: 2 and Ondansetron and Midazolam  Airway Management Planned: LMA  Additional Equipment:   Intra-op Plan:   Post-operative Plan: Extubation in OR  Informed Consent: I have reviewed the patients History and Physical, chart, labs and discussed the procedure including the risks, benefits and alternatives for the proposed anesthesia with the patient or authorized representative who has indicated his/her understanding and acceptance.     Plan Discussed with: CRNA and Surgeon  Anesthesia Plan Comments:         Anesthesia Quick Evaluation

## 2017-08-30 NOTE — Plan of Care (Signed)
  Education: Knowledge of General Education information will improve 08/30/2017 2205 - Progressing by Ashley Murrain, RN   Clinical Measurements: Ability to maintain clinical measurements within normal limits will improve 08/30/2017 2205 - Progressing by Ashley Murrain, RN   Nutrition: Adequate nutrition will be maintained 08/30/2017 2205 - Progressing by Ashley Murrain, RN

## 2017-08-30 NOTE — H&P (Signed)
History and Physical    Spencer Robles ZLD:[id number] DOB: 06-23-1961 DOA: 08/29/2017  I have briefly reviewed the patient's prior medical records in [place]  PCP: Patient, No Pcp Per  Patient coming from: home  Chief Complaint: purulent drainage from scrotum  HPI: Spencer Robles is a 57 y.o. male with medical history significant of poorly controlled diabetes mellitus, who presents to the hospital with purulent drainage from his scrotum.  He was admitted to the hospital at the end of January with scrotal cellulitis, he failed to improve on IV antibiotics and was eventually taken to the operating room by urology for I&D of a scrotal abscess.  He was discharged home just a few days ago, states that he continued to have pain/discomfort and continued drainage and decided to come back to the emergency room.  He denies any fever or chills, reports compliance with his doxycycline.  He reports pain which is about 4/10 in the area, bearable.  He has no chest pain or shortness of breath.  He denies any lightheadedness or dizziness.  There is also reports that patient ran out of supplies at home, and also per most recent progress note during his previous hospitalization that he was somewhat unengaged in RN teaching about dressing changes.  On cultures he grew MRSA which was sensitive to tetracyclines and patient was discharged home on doxycycline.  ED Course: In the emergency room his vital signs are stable, he was afebrile and normotensive.  Satting well on room air.  His blood work is remarkable for marked leukocytosis with a white count of 12.6, CBGs appear well-controlled.  Urology was consulted, plan to take patient to the operating room for reexploration, and we were asked to admit  Review of Systems: As per HPI otherwise 10 point review of systems negative.   Past Medical History:  Diagnosis Date  . Diabetes mellitus without complication ([place])     History reviewed. No pertinent surgical  history.   reports that  has never smoked. he has never used smokeless tobacco. His alcohol and drug histories are not on file.  No Known Allergies  Family history reviewed and noncontributory  Prior to Admission medications   Medication Sig Start Date End Date Taking? Authorizing Provider  acetaminophen (TYLENOL) 500 MG tablet Take 1,000 mg by mouth every 6 (six) hours as needed for mild pain.   Yes [provider]  Amino Acids-Protein Hydrolys (FEEDING SUPPLEMENT, PRO-STAT SUGAR FREE 64,) LIQD Take 30 mLs by mouth 3 (three) times daily with meals.   Yes [provider]  docusate sodium (COLACE) 100 MG capsule Take 100 mg by mouth 2 (two) times daily.   Yes [provider]  doxycycline (VIBRA-TABS) 100 MG tablet Take 100 mg by mouth 2 (two) times daily.   Yes [provider]  ENSURE (ENSURE) Take 237 mLs by mouth 2 (two) times daily.   Yes [provider]  insulin aspart (NOVOLOG) 100 UNIT/ML injection Inject 6 Units into the skin 2 (two) times daily.    Yes [provider]  insulin glargine (LANTUS) 100 UNIT/ML injection Inject 25 Units into the skin at bedtime.   Yes [provider]  metFORMIN (GLUCOPHAGE) 500 MG tablet Take 500 mg by mouth 2 (two) times daily with a meal.   Yes [provider]  oxyCODONE (OXY IR/ROXICODONE) 5 MG immediate release tablet Take 5 mg by mouth every 6 (six) hours as needed for severe pain.    Yes [provider]  polyethylene glycol (MIRALAX / GLYCOLAX) packet Take 17 g by mouth daily as needed for mild constipation.   Yes [provider]    Physical Exam: Vitals:   08/29/17 2136 08/30/17 0009 08/30/17 0100 08/30/17 0217  BP: (!) 171/95 128/82 134/81 (!) 141/83  Pulse: 89 74 64 67  Resp: 20 18 16 18   Temp:    98.9 F (37.2 C)  TempSrc:    Oral  SpO2: 99% 99% 100% 98%  Weight:    103.8 kg (228 lb 13.4 oz)  Height:    5' 11.5" (1.816 m)      Constitutional:  NAD, calm, comfortable Eyes:  lids and conjunctivae normal ENMT: Mucous membranes are moist.  Neck: normal, supple Respiratory: clear to auscultation bilaterally, no wheezing, no crackles.  Cardiovascular: Regular rate and rhythm, no murmurs / rubs / gallops. No extremity edema. Abdomen: no tenderness, no masses palpated.  Musculoskeletal: no clubbing / cyanosis.  Skin: no rashes.  Groin area packed, dressing clean Neurologic: CN 2-12 grossly intact. Strength 5/5 in all 4.  Psychiatric: Normal judgment and insight. Alert and oriented x 3. Normal mood.   Labs on Admission: I have personally reviewed following labs and imaging studies  CBC: Recent Labs  Lab 08/29/17 1431  WBC 12.6*  NEUTROABS 8.9*  HGB 11.8*  HCT 36.5*  MCV 85.3  PLT 536*   Basic Metabolic Panel: Recent Labs  Lab 08/29/17 1431  NA 137  K 4.4  CL 100*  CO2 31  GLUCOSE 96  BUN 12  CREATININE 0.80  CALCIUM 9.1   GFR: Estimated Creatinine Clearance: 124.4 mL/min (by C-G formula based on SCr of 0.8 mg/dL). Liver Function Tests: No results for input(s): AST, ALT, ALKPHOS, BILITOT, PROT, ALBUMIN in the last 168 hours. No results for input(s): LIPASE, AMYLASE in the last 168 hours. No results for input(s): AMMONIA in the last 168 hours. Coagulation Profile: No results for input(s): INR, PROTIME in the last 168 hours. Cardiac Enzymes: No results for input(s): CKTOTAL, CKMB, CKMBINDEX, TROPONINI in the last 168 hours. BNP (last 3 results) No results for input(s): PROBNP in the last 8760 hours. HbA1C: No results for input(s): HGBA1C in the last 72 hours. CBG: Recent Labs  Lab 08/30/17 0253 08/30/17 0612 08/30/17 0820  GLUCAP 108* 118* 105*   Lipid Profile: No results for input(s): CHOL, HDL, LDLCALC, TRIG, CHOLHDL, LDLDIRECT in the last 72 hours. Thyroid Function Tests: No results for input(s): TSH, T4TOTAL, FREET4, T3FREE, THYROIDAB in the last 72 hours. Anemia Panel: No results for input(s):  VITAMINB12, FOLATE, FERRITIN, TIBC, IRON, RETICCTPCT in the last 72 hours. Urine analysis:    Component Value Date/Time   COLORURINE YELLOW 08/30/2017 [place] 08/30/2017 0053   LABSPEC 1.019 08/30/2017 0053   PHURINE 5.0 08/30/2017 0053   GLUCOSEU NEGATIVE 08/30/2017 0053   HGBUR SMALL (A) 08/30/2017 0053   BILIRUBINUR NEGATIVE 08/30/2017 0053   KETONESUR NEGATIVE 08/30/2017 0053   PROTEINUR NEGATIVE 08/30/2017 0053   NITRITE NEGATIVE 08/30/2017 0053   LEUKOCYTESUR TRACE (A) 08/30/2017 0053     Radiological Exams on Admission: US Scrotum W/doppler  Result Date: 08/29/2017 CLINICAL DATA:  Left testicular swelling for several days EXAM: SCROTAL ULTRASOUND DOPPLER ULTRASOUND OF THE TESTICLES TECHNIQUE: Complete ultrasound examination of the testicles, epididymis, and other scrotal structures was performed. Color and spectral Doppler ultrasound were also utilized to evaluate blood flow to the testicles. COMPARISON:  08/15/2017 FINDINGS: Right testicle Measurements: 4.0 x 1.8 x 3.7 cm. Previously seen  heterogeneity of the right testicle is not as well appreciated on today's exam. Improved vascularity is noted. No focal mass is seen. Left testicle Measurements: 4.0 x 1.8 x 3.9 cm. No mass or microlithiasis visualized. Right epididymis:  Normal in size and appearance. Left epididymis:  Normal in size and appearance. Hydrocele: Right-sided hydrocele is noted increased from the prior exam. Varicocele:  None visualized. Pulsed Doppler interrogation of both testes demonstrates normal low resistance arterial and venous waveforms bilaterally. Scrotal wall swelling is noted consistent with the known history of prior scrotal abscess and drainage. IMPRESSION: Resolution of previously seen heterogeneity within the right testicle. Normal-appearing left testicle. Scrotal wall thickening consistent with the known inflammatory changes and recent scrotal abscess drainage. Right-sided hydrocele.  This  is increased from the prior exam. Electronically Signed   By: Inez Catalina M.D.   On: 08/29/2017 15:26   Assessment/Plan Active Problems:   Scrotal abscess   Abscess   MRSA positive scrotal abscess/cellulitis -Status post hospitalization for the same, was discharged home and came back a few days later.  Not sure about his home compliance with dressing changes -Appreciate urology assistance, patient will be taken to the operating room today for reexploration -For now continue broad-spectrum antibiotics  Poorly controlled diabetes mellitus -Most recent hemoglobin A1c was in the 14 range in December 2018 continue Lantus and sliding scale, currently sugars are well controlled    DVT prophylaxis: SCD  Code Status: Full code Family Communication: No family at bedside Disposition Plan: To be determined to come home when ready Consults called: Urology    Admission status: Inpatient   Marzetta Board, MD [facility] Pager [phone]  If 7PM-7AM, please contact night-coverage [email or web address] Password [facility]  08/30/2017, 9:56 AM

## 2017-08-30 NOTE — Progress Notes (Signed)
Patient ID: Spencer Robles, male   DOB: 1960-11-26, 57 y.o.   MRN: [id number]    Subjective: Patient is well known to me from his prior admission at [facility].  He was discharged earlier this week to home for self wound care.  He presented back to the ED last night because he "ran out of supplies".  He denies fever.  He has noted an area on the ventral penile shaft that has become "whitish" and has increased drainage.  Objective: Vital signs in last 24 hours: Temp:  [98.6 F (37 C)-98.9 F (37.2 C)] 98.9 F (37.2 C) (02/07 0217) Pulse Rate:  [64-89] 67 (02/07 0217) Resp:  [16-20] 18 (02/07 0217) BP: (128-171)/(80-95) 141/83 (02/07 0217) SpO2:  [96 %-100 %] 98 % (02/07 0217) Weight:  [103.8 kg (228 lb 13.4 oz)] 103.8 kg (228 lb 13.4 oz) (02/07 0217)  Intake/Output from previous day: 02/06 0701 - 02/07 0700 In: -  Out: 400 [Urine:400] Intake/Output this shift: No intake/output data recorded.  Physical Exam:  General: Alert and oriented GU: Posterior scrotal wound appears clean and beginning to granulate well.  Anterior scrotum and ventral penile shaft still edematous.  He does have an area that appears progressively non-viable from the ventral penile shaft down the anterior scrotum.   Lab Results: Recent Labs    08/29/17 1431  HGB 11.8*  HCT 36.5*   CBC Latest Ref Rng & Units 08/29/2017  WBC 4.0 - 10.5 K/uL 12.6(H)  Hemoglobin 13.0 - 17.0 g/dL 11.8(L)  Hematocrit 39.0 - 52.0 % 36.5(L)  Platelets 150 - 400 K/uL 572(H)     BMET Recent Labs    08/29/17 1431  NA 137  K 4.4  CL 100*  CO2 31  GLUCOSE 96  BUN 12  CREATININE 0.80  CALCIUM 9.1     Studies/Results: US Scrotum W/doppler  Result Date: 08/29/2017 CLINICAL DATA:  Left testicular swelling for several days EXAM: SCROTAL ULTRASOUND DOPPLER ULTRASOUND OF THE TESTICLES TECHNIQUE: Complete ultrasound examination of the testicles, epididymis, and other scrotal structures was performed. Color and spectral Doppler ultrasound  were also utilized to evaluate blood flow to the testicles. COMPARISON:  08/15/2017 FINDINGS: Right testicle Measurements: 4.0 x 1.8 x 3.7 cm. Previously seen heterogeneity of the right testicle is not as well appreciated on today's exam. Improved vascularity is noted. No focal mass is seen. Left testicle Measurements: 4.0 x 1.8 x 3.9 cm. No mass or microlithiasis visualized. Right epididymis:  Normal in size and appearance. Left epididymis:  Normal in size and appearance. Hydrocele: Right-sided hydrocele is noted increased from the prior exam. Varicocele:  None visualized. Pulsed Doppler interrogation of both testes demonstrates normal low resistance arterial and venous waveforms bilaterally. Scrotal wall swelling is noted consistent with the known history of prior scrotal abscess and drainage. IMPRESSION: Resolution of previously seen heterogeneity within the right testicle. Normal-appearing left testicle. Scrotal wall thickening consistent with the known inflammatory changes and recent scrotal abscess drainage. Right-sided hydrocele.  This is increased from the prior exam. Electronically Signed   By: Inez Catalina M.D.   On: 08/29/2017 15:26    Assessment/Plan: MRSA scrotal abscess - Continue appropriate culture specific antibiotic therapy per prior MRSA culture.  He has been on doxycycline. - I do think he will benefit from additional debridement of non-viable tissue to facilitate quicker wound healing.  I do not think he will be best served by discharge home after hospitalization.  Considering his history of medical non-compliance and poor medical comprehension (Hgb A1c  was over 14 at time of last admission), I think he will be best served by either home health care to assist with wound care vs. SNF.  I discussed proceed with irrigation and debridement of wound today and he gives consent to proceed after discussing the potential risks, benefits, and potential complications, etc.   - Keep NPO for procedure  later today.    LOS: 0 days   Elmore Hyslop,LES 08/30/2017, 7:21 AM

## 2017-08-30 NOTE — Anesthesia Procedure Notes (Signed)
Procedure Name: LMA Insertion Date/Time: 08/30/2017 3:04 PM Performed by: Glory Buff, CRNA Pre-anesthesia Checklist: Patient identified, Emergency Drugs available, Suction available and Patient being monitored Patient Re-evaluated:Patient Re-evaluated prior to induction Oxygen Delivery Method: Circle system utilized Preoxygenation: Pre-oxygenation with 100% oxygen Induction Type: IV induction LMA: LMA inserted LMA Size: 4.0 Number of attempts: 1 Placement Confirmation: positive ETCO2 and breath sounds checked- equal and bilateral Tube secured with: Tape Dental Injury: Teeth and Oropharynx as per pre-operative assessment

## 2017-08-30 NOTE — ED Notes (Signed)
Urology at bedside examining patient. Patient's scrotum is greatly swollen-the skin underneath the scrotum is necrotic and has split open draining purulent drainage. Urology placed wet to dry dressing to underside of scrotum and mesh panties placed to hold dressing in place.

## 2017-08-30 NOTE — Consult Note (Signed)
Urology Consult  Consulting MD: Winfred Leeds  CC: Scrotal abnormality  HPI: This is a 57year old male with long-standing diabetes and sequelae thereof who was sent home from the hospital 3 days ago status post incision and drainage of a scrotal abscess.  Cultures grew methicillin-resistant staph.  He presented to the hospital again the day complaining of white area on his scrotum as well as some pain.  He has been taking doxycycline that he was sent home with, and says that he has been compliant with dressing changes.  Patient presented, and evaluation revealed persistent scrotal edema and drainage from his wound.  Urologic consultation is requested.  PMH: Past Medical History:  Diagnosis Date  . Diabetes mellitus without complication ([facility])     PSH:   Allergies: Allergies not on file  Medications:  (Not in a hospital admission)   Social History: Social History   Socioeconomic History  . Marital status: Not on file    Spouse name: Not on file  . Number of children: Not on file  . Years of education: Not on file  . Highest education level: Not on file  Social Needs  . Financial resource strain: Not on file  . Food insecurity - worry: Not on file  . Food insecurity - inability: Not on file  . Transportation needs - medical: Not on file  . Transportation needs - non-medical: Not on file  Occupational History  . Not on file  Tobacco Use  . Smoking status: Not on file  Substance and Sexual Activity  . Alcohol use: Not on file  . Drug use: Not on file  . Sexual activity: Not on file  Other Topics Concern  . Not on file  Social History Narrative  . Not on file    Family History: No family history on file.  Review of Systems: Positive: Scrotal swelling but no significant pain.  Scrotal drainage.  No fevers, shakes or chills. Negative:   A further 10 point review of systems was negative except what is listed in the HPI.  Physical Exam: @VITALS2 @ General: No acute  distress.  Awake. Head:  Normocephalic.  Atraumatic. ENT:  EOMI.  Mucous membranes moist Neck:  Supple.  No lymphadenopathy. CV:  S1 present. S2 present. Regular rate. Pulmonary: Equal effort bilaterally.  Clear to auscultation bilaterally. Abdomen: Soft.  Non-tender to palpation.  Some induration in the infrapubic area, left greater than right.  There is no crepitus in this area. Extremity: No gross deformity of bilateral upper extremities.  Status post right toe amputations. Neurologic: Alert. Appropriate mood.  Penis:  Penile skin swelling.  Penile skin itself appears viable. Urethra: Meatus orthotopic. Scrotum:         Wound and posterior scrotum open, draining moderate amount of thin yellow discharge.  No crepitus on the scrotal skin.  Anterior scrotal skin between the wound and the base of the penis seems to be devitalized.  This has decreased sensitivity to the patient.  Probing the wound, there is no undrained purulent matter. Perineum:         No fluctuance, no significant tenderness, no crepitus  Studies:  Recent Labs    08/29/17 1431  HGB 11.8*  WBC 12.6*  PLT 572*    Recent Labs    08/29/17 1431  NA 137  K 4.4  CL 100*  CO2 31  BUN 12  CREATININE 0.80  CALCIUM 9.1  GFRNONAA >60  GFRAA >60     No results for input(s): INR,  APTT in the last 72 hours.  Invalid input(s): PT   Invalid input(s): ABG    Assessment: 57 year old diabetic male status post recent incision and drainage of scrotal wound.  He has persistent scrotal edema and, I feel devitalized anterior scrotal skin.  While this does not seem to be involved with Fournier's gangrene at this time, I think fairly urgent debridement is called for as an inpatient.  Plan: I have spoken with the patient regarding his situation.  I will ask the medicine service to admit him, and we will add him to the surgical schedule later today for debridement of scrotum.  The patient is in agreement with this.  His wound  was repacked.    Pager:[phone]

## 2017-08-30 NOTE — Transfer of Care (Signed)
Immediate Anesthesia Transfer of Care Note  Patient: Spencer Robles  Procedure(s) Performed: SCROTUM DEBRIDEMENT (N/A )  Patient Location: PACU  Anesthesia Type:General  Level of Consciousness: awake, alert  and oriented  Airway & Oxygen Therapy: Patient Spontanous Breathing and Patient connected to face mask oxygen  Post-op Assessment: Report given to RN and Post -op Vital signs reviewed and stable  Post vital signs: Reviewed and stable  Last Vitals:  Vitals:   08/30/17 0100 08/30/17 0217  BP: 134/81 (!) 141/83  Pulse: 64 67  Resp: 16 18  Temp:  37.2 C  SpO2: 100% 98%    Last Pain:  Vitals:   08/30/17 1335  TempSrc:   PainSc: 4       Patients Stated Pain Goal: 4 (55/20/80 2233)  Complications: No apparent anesthesia complications

## 2017-08-30 NOTE — ED Notes (Signed)
ED TO INPATIENT HANDOFF REPORT  Name/Age/Gender Spencer Robles 57 y.o. male  Code Status Code Status History    This patient does not have a recorded code status. Please follow your organizational policy for patients in this situation.      Home/SNF/Other Home  Chief Complaint Penial Discharge  Level of Care/Admitting Diagnosis ED Disposition    ED Disposition Condition [facility]: [facility] [100102]  Level of Care: Med-Surg [16]  Diagnosis: Scrotal abscess [720947]  Admitting Physician: Etta Quill [[id number]]  Attending Physician: Etta Quill [[id number]]  Estimated length of stay: past midnight tomorrow  Certification:: I certify this patient will need inpatient services for at least 2 midnights  PT Class (Do Not Modify): Inpatient [101]  PT Acc Code (Do Not Modify): Private [1]       Medical History Past Medical History:  Diagnosis Date  . Diabetes mellitus without complication ([place])     Allergies No Known Allergies  IV Location/Drains/Wounds Patient Lines/Drains/Airways Status   Active Line/Drains/Airways    Name:   Placement date:   Placement time:   Site:   Days:   Peripheral IV 08/30/17 Left Hand   08/30/17    0041    Hand   less than 1          Labs/Imaging Results for orders placed or performed during the hospital encounter of 08/29/17 (from the past 48 hour(s))  CBC with Differential     Status: Abnormal   Collection Time: 08/29/17  2:31 PM  Result Value Ref Range   WBC 12.6 (H) 4.0 - 10.5 K/uL   RBC 4.28 4.22 - 5.81 MIL/uL   Hemoglobin 11.8 (L) 13.0 - 17.0 g/dL   HCT 36.5 (L) 39.0 - 52.0 %   MCV 85.3 78.0 - 100.0 fL   MCH 27.6 26.0 - 34.0 pg   MCHC 32.3 30.0 - 36.0 g/dL   RDW 14.6 11.5 - 15.5 %   Platelets 572 (H) 150 - 400 K/uL   Neutrophils Relative % 70 %   Neutro Abs 8.9 (H) 1.7 - 7.7 K/uL   Lymphocytes Relative 22 %   Lymphs Abs 2.8 0.7 - 4.0 K/uL   Monocytes Relative 6 %   Monocytes  Absolute 0.7 0.1 - 1.0 K/uL   Eosinophils Relative 1 %   Eosinophils Absolute 0.1 0.0 - 0.7 K/uL   Basophils Relative 1 %   Basophils Absolute 0.1 0.0 - 0.1 K/uL    Comment: Performed at [facility], [place] [address], [place], [place] [address]  Basic metabolic panel     Status: Abnormal   Collection Time: 08/29/17  2:31 PM  Result Value Ref Range   Sodium 137 135 - 145 mmol/L   Potassium 4.4 3.5 - 5.1 mmol/L   Chloride 100 (L) 101 - 111 mmol/L   CO2 31 22 - 32 mmol/L   Glucose, Bld 96 65 - 99 mg/dL   BUN 12 6 - 20 mg/dL   Creatinine, Ser 0.80 0.61 - 1.24 mg/dL   Calcium 9.1 8.9 - 10.3 mg/dL   GFR calc non Af Amer >60 >60 mL/min   GFR calc Af Amer >60 >60 mL/min    Comment: (NOTE) The eGFR has been calculated using the CKD EPI equation. This calculation has not been validated in all clinical situations. eGFR's persistently <60 mL/min signify possible Chronic Kidney Disease.    Anion gap 6 5 - 15    Comment: Performed  at [facility], [place] [address], [place], [place] [address]  Urinalysis, Routine w reflex microscopic     Status: Abnormal   Collection Time: 08/30/17 12:53 AM  Result Value Ref Range   Color, Urine YELLOW YELLOW   APPearance CLEAR CLEAR   Specific Gravity, Urine 1.019 1.005 - 1.030   pH 5.0 5.0 - 8.0   Glucose, UA NEGATIVE NEGATIVE mg/dL   Hgb urine dipstick SMALL (A) NEGATIVE   Bilirubin Urine NEGATIVE NEGATIVE   Ketones, ur NEGATIVE NEGATIVE mg/dL   Protein, ur NEGATIVE NEGATIVE mg/dL   Nitrite NEGATIVE NEGATIVE   Leukocytes, UA TRACE (A) NEGATIVE   RBC / HPF 0-5 0 - 5 RBC/hpf   WBC, UA 0-5 0 - 5 WBC/hpf   Bacteria, UA RARE (A) NONE SEEN   Squamous Epithelial / LPF 0-5 (A) NONE SEEN   Mucus PRESENT     Comment: Performed at [facility], [place] [address] [place], [place] [address]   US Scrotum W/doppler  Result Date: 08/29/2017 CLINICAL DATA:  Left testicular swelling for several days EXAM: SCROTAL  ULTRASOUND DOPPLER ULTRASOUND OF THE TESTICLES TECHNIQUE: Complete ultrasound examination of the testicles, epididymis, and other scrotal structures was performed. Color and spectral Doppler ultrasound were also utilized to evaluate blood flow to the testicles. COMPARISON:  08/15/2017 FINDINGS: Right testicle Measurements: 4.0 x 1.8 x 3.7 cm. Previously seen heterogeneity of the right testicle is not as well appreciated on today's exam. Improved vascularity is noted. No focal mass is seen. Left testicle Measurements: 4.0 x 1.8 x 3.9 cm. No mass or microlithiasis visualized. Right epididymis:  Normal in size and appearance. Left epididymis:  Normal in size and appearance. Hydrocele: Right-sided hydrocele is noted increased from the prior exam. Varicocele:  None visualized. Pulsed Doppler interrogation of both testes demonstrates normal low resistance arterial and venous waveforms bilaterally. Scrotal wall swelling is noted consistent with the known history of prior scrotal abscess and drainage. IMPRESSION: Resolution of previously seen heterogeneity within the right testicle. Normal-appearing left testicle. Scrotal wall thickening consistent with the known inflammatory changes and recent scrotal abscess drainage. Right-sided hydrocele.  This is increased from the prior exam. Electronically Signed   By: Inez Catalina M.D.   On: 08/29/2017 15:26    Pending Labs Unresulted Labs (From admission, onward)   Start     Ordered   08/31/17 0500  Creatinine, serum  Daily,   R     08/30/17 0042   08/29/17 1348  Urine culture  STAT,   STAT     08/29/17 1347      Vitals/Pain Today's Vitals   08/29/17 2136 08/30/17 0009 08/30/17 0009 08/30/17 0100  BP: (!) 171/95 128/82  134/81  Pulse: 89 74  64  Resp: 20 18  16   Temp:      TempSrc:      SpO2: 99% 99%  100%  PainSc:   4      Isolation Precautions No active isolations  Medications Medications  piperacillin-tazobactam (ZOSYN) IVPB 3.375 g (not  administered)  vancomycin (VANCOCIN) IVPB 1000 mg/200 mL premix (not administered)  insulin aspart (novoLOG) injection 0-9 Units (not administered)  insulin glargine (LANTUS) injection 10 Units (not administered)  0.9 %  sodium chloride infusion (not administered)    Mobility walks

## 2017-08-30 NOTE — Op Note (Signed)
Preoperative diagnosis: Scrotal abscess  Postoperative diagnosis: Scrotal abscess  Surgeon: Pryor Curia MD  Procedure: Irrigation and debridement of scrotal wound  Anesthesia: General  Estimated blood loss: Minimal  Specimens: None  Indication: Spencer Robles is a 57 year old gentleman who was recently admitted to [facility] and was found to have a scrotal abscess with cultures positive for MRSA.  He was able to be discharged home a few days ago with local wound care and oral antibiotic therapy.  He presented back to the emergency room late last evening as he ran out of supplies.  He was examined and a urologic consultation was obtained.  He did appear to have areas of nonviable tissue that would potentially benefit from debridement.  I examined him this morning and was in agreement.  After discussing options, he did elect to proceed with the above procedures after reviewing the potential risks, complications, and the expected recovery process.  Description of procedure: The patient was taken to the operating room and a general anesthetic was administered.  He has been on broad-spectrum IV antibiotics and these were confirmed to have been administered and were up to date.  He was placed in the dorsal lithotomy position and prepped and draped in the usual sterile fashion.  A preoperative timeout was performed.  Examination was performed of his genitalia.  He was noted to have good granulation tissue in the midline wound that had been undergoing dressing changes.  However, he had a very well demarcated area of necrotic tissue along the anterior aspect of the scrotum extending up the midline toward the ventral aspect of the penoscrotal junction and the penile skin.  The skin was clearly not viable.  I therefore used a combination of sharp and cautery dissection to remove this area of nonviable skin.  I also evaluated his wound thoroughly and did not see any further areas of purulent  drainage to open.  There was still some fibrinous tissue but it was felt that this could be managed with local wound care to avoid excessive tissue debridement understanding that he may need further debridement in the future.  The wound was then copiously irrigated with pulse lavage.  A wet-to-dry Kerlix saline dressing was then placed.  Patient tolerated the procedure well without complications.  He was able to be awakened and transferred to the recovery unit in satisfactory condition.

## 2017-08-30 NOTE — Progress Notes (Signed)
Pharmacy Antibiotic Note  Spencer Robles is a 57 y.o. male with scrotal abscess admitted on 08/29/2017 with cellulitis.  Pharmacy has been consulted for zosyn and vancomycin dosing.  Plan: Zosyn 3.375g IV q8h (4 hour infusion).  Vancomycin 2 Gm x1 then 1250 mg IV q12h for est AUC=469 Goal AUC = 400-500 Daily Scr F/u cultures/levels     Temp (24hrs), Avg:98.6 F (37 C), Min:98.6 F (37 C), Max:98.6 F (37 C)  Recent Labs  Lab 08/29/17 1431  WBC 12.6*  CREATININE 0.80    CrCl cannot be calculated (Unknown ideal weight.).    No Known Allergies  Antimicrobials this admission: 2/7 zosyn >>  2/7 vancomycin >>   Dose adjustments this admission:   Microbiology results:  BCx:   UCx:    Sputum:    MRSA PCR:   Thank you for allowing pharmacy to be a part of this patient's care.  Dorrene German 08/30/2017 12:39 AM

## 2017-08-31 ENCOUNTER — Encounter (HOSPITAL_COMMUNITY): Payer: Self-pay

## 2017-08-31 ENCOUNTER — Telehealth: Payer: Self-pay | Admitting: Physician Assistant

## 2017-08-31 LAB — CBC
HEMATOCRIT: 31.3 % — AB (ref 39.0–52.0)
Hemoglobin: 10.2 g/dL — ABNORMAL LOW (ref 13.0–17.0)
MCH: 27.4 pg (ref 26.0–34.0)
MCHC: 32.6 g/dL (ref 30.0–36.0)
MCV: 84.1 fL (ref 78.0–100.0)
Platelets: 484 10*3/uL — ABNORMAL HIGH (ref 150–400)
RBC: 3.72 MIL/uL — ABNORMAL LOW (ref 4.22–5.81)
RDW: 14.2 % (ref 11.5–15.5)
WBC: 14.7 10*3/uL — AB (ref 4.0–10.5)

## 2017-08-31 LAB — HIV ANTIBODY (ROUTINE TESTING W REFLEX): HIV SCREEN 4TH GENERATION: NONREACTIVE

## 2017-08-31 LAB — BASIC METABOLIC PANEL
Anion gap: 7 (ref 5–15)
BUN: 12 mg/dL (ref 6–20)
CHLORIDE: 96 mmol/L — AB (ref 101–111)
CO2: 28 mmol/L (ref 22–32)
Calcium: 8.6 mg/dL — ABNORMAL LOW (ref 8.9–10.3)
Creatinine, Ser: 0.86 mg/dL (ref 0.61–1.24)
GFR calc Af Amer: >60 mL/min (ref >60)
GFR calc non Af Amer: >60 mL/min (ref >60)
GLUCOSE: 184 mg/dL — AB (ref 65–99)
POTASSIUM: 4 mmol/L (ref 3.5–5.1)
Sodium: 131 mmol/L — ABNORMAL LOW (ref 135–145)

## 2017-08-31 LAB — URINE CULTURE: CULTURE: NO GROWTH

## 2017-08-31 LAB — GLUCOSE, CAPILLARY
GLUCOSE-CAPILLARY: 129 mg/dL — AB (ref 65–99)
Glucose-Capillary: 184 mg/dL — ABNORMAL HIGH (ref 65–99)
Glucose-Capillary: 149 mg/dL — ABNORMAL HIGH (ref 65–99)
Glucose-Capillary: 179 mg/dL — ABNORMAL HIGH (ref 65–99)
Glucose-Capillary: 166 mg/dL — ABNORMAL HIGH (ref 65–99)

## 2017-08-31 MED ORDER — DEXTROSE 5 % IV SOLN
100.0000 mg | Freq: Two times a day (BID) | INTRAVENOUS | Status: DC
  Administered 2017-08-31 – 2017-09-02 (×5): 100 mg via INTRAVENOUS
  Filled 2017-08-31 (×6): qty 100

## 2017-08-31 MED ORDER — ENOXAPARIN SODIUM 40 MG/0.4ML ~~LOC~~ SOLN
40.0000 mg | Freq: Every day | SUBCUTANEOUS | Status: DC
  Administered 2017-08-31 – 2017-09-04 (×5): 40 mg via SUBCUTANEOUS
  Filled 2017-08-31 (×6): qty 0.4

## 2017-08-31 MED ORDER — DOXYCYCLINE HYCLATE 100 MG PO TABS
100.0000 mg | ORAL_TABLET | Freq: Two times a day (BID) | ORAL | Status: DC
  Administered 2017-08-31: 100 mg via ORAL
  Filled 2017-08-31: qty 1

## 2017-08-31 NOTE — Telephone Encounter (Signed)
Call placed to [place] (case manager) [phone] regarding patient. Spoke with Cookie and informed her that unfortunately we [facility]) cannot help patient with transportation. Cookie understood. No further questions or concerns.

## 2017-08-31 NOTE — Plan of Care (Signed)
  Clinical Measurements: Will remain free from infection 08/31/2017 2027 - Progressing by Ashley Murrain, RN

## 2017-08-31 NOTE — Progress Notes (Signed)
Pt will not qualify for SNF if there is no need for continuing IV ABX after discharge.  Working on [facility], however pt do not have anyone at home with him to teach wound care. Pt will need to be taught how to change his dressing here while.  Will continue to follow and encourage pt to find someone to assist him with dressing changes if he discharges home.

## 2017-08-31 NOTE — NC FL2 (Signed)
  [place] LEVEL OF CARE SCREENING TOOL     IDENTIFICATION  Patient Name: Spencer Robles Birthdate: 1961-05-04 Sex: male Admission Date (Current Location): 08/29/2017  [place] and [place] Number:  Herbalist and Address:  [facility],  [place] [address], [place]      Provider Number: [id number]  Attending Physician Name and Address:  Caren Griffins, MD  Relative Name and Phone Number:       Current Level of Care: Hospital Recommended Level of Care: [place] Prior Approval Number:    Date Approved/Denied: 08/31/17 PASRR Number: [id number]  Discharge Plan: SNF    Current Diagnoses: Patient Active Problem List   Diagnosis Date Noted  . Scrotal abscess 08/30/2017  . Abscess 08/30/2017    Orientation RESPIRATION BLADDER Height & Weight     Self, Time, Situation, Place  Normal Continent Weight: 228 lb 13.4 oz (103.8 kg) Height:  5' 11.5" (181.6 cm)  BEHAVIORAL SYMPTOMS/MOOD NEUROLOGICAL BOWEL NUTRITION STATUS      Continent Diet(See dc summary)  AMBULATORY STATUS COMMUNICATION OF NEEDS Skin   Extensive Assist Verbally Surgical wounds(Incisions, Scrotum )                       Personal Care Assistance Level of Assistance  Bathing, Feeding, Dressing Bathing Assistance: Limited assistance Feeding assistance: Independent Dressing Assistance: Limited assistance     Functional Limitations Info  Sight, Hearing, Speech Sight Info: Adequate Hearing Info: Adequate Speech Info: Adequate    SPECIAL CARE FACTORS FREQUENCY                       Contractures Contractures Info: Not present    Additional Factors Info  Code Status, Allergies, Isolation Precautions Code Status Info: Full Allergies Info: NKA     Isolation Precautions Info: MRSA     Current Medications (08/31/2017):  This is the current hospital active medication list Current Facility-Administered Medications  Medication Dose  Route Frequency Provider Last Rate Last Dose  . 0.9 %  sodium chloride infusion   Intravenous Continuous Etta Quill, DO 75 mL/hr at 08/30/17 1716    . doxycycline (VIBRAMYCIN) 100 mg in dextrose 5 % 250 mL IVPB  100 mg Intravenous Q12H Gherghe, Costin M, MD      . enoxaparin (LOVENOX) injection 40 mg  40 mg Subcutaneous Daily Caren Griffins, MD   40 mg at 08/31/17 1519  . insulin aspart (novoLOG) injection 0-9 Units  0-9 Units Subcutaneous Q4H Etta Quill, DO   2 Units at 08/31/17 1639  . insulin glargine (LANTUS) injection 10 Units  10 Units Subcutaneous QHS Etta Quill, DO   10 Units at 08/30/17 2154  . oxyCODONE (Oxy IR/ROXICODONE) immediate release tablet 5-10 mg  5-10 mg Oral Q4H PRN Caren Griffins, MD   5 mg at 08/31/17 1130     Discharge Medications: Please see discharge summary for a list of discharge medications.  Relevant Imaging Results:  Relevant Lab Results:   Additional Information ssn: [id number]   Servando Snare, LCSW

## 2017-08-31 NOTE — Progress Notes (Addendum)
PROGRESS NOTE  Spencer Robles BOF:[id number] DOB: Jul 26, 1960 DOA: 08/29/2017 PCP: Patient, No Pcp Per   LOS: 1 day   Brief Narrative / Interim history: 57 year old male with poorly controlled diabetes who came in with recurrent scrotal infection.He was admitted to the hospital at the end of January with scrotal cellulitis, he failed to improve on IV antibiotics and was eventually taken to the operating room by urology for I&D, he is growing MRSA.  He was discharged home on doxycycline based on sensitivities, however patient had difficulties in doing dressing changes, ran out of supplies, is once started having more discharge and decided to come back.  He was taken again to the operating room on 2/7 for further debridement.  Assessment & Plan: Active Problems:   Scrotal abscess   Abscess    MRSA positive scrotal abscess/cellulitis -Status post hospitalization for the same, was discharged home and came back a few days later.  -Patient taken again to the operating room on 2/7 for further debridement -Appreciate urology assistance -I agree that he is a very high risk for readmission as he has poor social support for him to appropriately do his dressing changes at home.  Discussed with case manager and social worker to see how can be best help the patient -Agree with urology, will keep over the weekend given the severity of his MRSA infection. -Discontinue vancomycin and Zosyn, placed on IV doxycycline -Very high risk of readmission will need a very safe and sound discharge plan given wound care needs  Poorly controlled diabetes mellitus -Most recent hemoglobin A1c was in the 14 range in December 2018 continue Lantus and sliding scale, currently sugars are well controlled -Discussed with patient extensively at bedside regarding the fact that he would need to much better control of his diabetes at home as this probably is more of the main reasons why he is hospitalized currently with  #1    DVT prophylaxis: Lovenox Code Status: Full code Family Communication: no family at bedside Disposition Plan: home vs SNF 3-4 days  Consultants:   Urology   Procedures:   I&D 2/7  Antimicrobials:  Vancomycin 2/7 >> 2/8  Zosyn 2/7 >> 2/8  Doxycycline 2/8 >>  Subjective: - no chest pain, shortness of breath, no abdominal pain, nausea or vomiting.   Objective: Vitals:   08/30/17 1648 08/30/17 2104 08/30/17 2359 08/31/17 0415  BP: 138/86 (!) 142/85 133/76 (!) 141/78  Pulse: 66 65 71 75  Resp: 12 14 16 18   Temp: 98.1 F (36.7 C) 99.3 F (37.4 C) 99 F (37.2 C) 99 F (37.2 C)  TempSrc:  Oral Oral Oral  SpO2: 100% 96% 96% 97%  Weight:      Height:        Intake/Output Summary (Last 24 hours) at 08/31/2017 1237 Last data filed at 08/31/2017 1000 Gross per 24 hour  Intake 4632.5 ml  Output 1675 ml  Net 2957.5 ml   Filed Weights   08/30/17 0217  Weight: 103.8 kg (228 lb 13.4 oz)    Examination:  Constitutional: NAD Eyes: lids and conjunctivae normal ENMT: Mucous membranes are moist.  Respiratory: clear to auscultation bilaterally, no wheezing, no crackles. Normal respiratory effort. Cardiovascular: Regular rate and rhythm, no murmurs / rubs / gallops. No LE edema.  Abdomen: no tenderness. Skin: no rashes. Scrotal wound packed and dressing intact Neurologic: CN 2-12 grossly intact. Strength 5/5 in all 4.  Psychiatric: Normal judgment and insight. Alert and oriented x 3. Normal mood.  Data Reviewed: I have independently reviewed following labs and imaging studies   CBC: Recent Labs  Lab 08/29/17 1431 08/31/17 0505  WBC 12.6* 14.7*  NEUTROABS 8.9*  --   HGB 11.8* 10.2*  HCT 36.5* 31.3*  MCV 85.3 84.1  PLT 572* 938*   Basic Metabolic Panel: Recent Labs  Lab 08/29/17 1431 08/31/17 0505  NA 137 131*  K 4.4 4.0  CL 100* 96*  CO2 31 28  GLUCOSE 96 184*  BUN 12 12  CREATININE 0.80 0.86  CALCIUM 9.1 8.6*   GFR: Estimated Creatinine  Clearance: 115.7 mL/min (by C-G formula based on SCr of 0.86 mg/dL). Liver Function Tests: No results for input(s): AST, ALT, ALKPHOS, BILITOT, PROT, ALBUMIN in the last 168 hours. No results for input(s): LIPASE, AMYLASE in the last 168 hours. No results for input(s): AMMONIA in the last 168 hours. Coagulation Profile: No results for input(s): INR, PROTIME in the last 168 hours. Cardiac Enzymes: No results for input(s): CKTOTAL, CKMB, CKMBINDEX, TROPONINI in the last 168 hours. BNP (last 3 results) No results for input(s): PROBNP in the last 8760 hours. HbA1C: No results for input(s): HGBA1C in the last 72 hours. CBG: Recent Labs  Lab 08/30/17 1604 08/30/17 2102 08/30/17 2356 08/31/17 0409 08/31/17 0736  GLUCAP 76 287* 265* 184* 149*   Lipid Profile: No results for input(s): CHOL, HDL, LDLCALC, TRIG, CHOLHDL, LDLDIRECT in the last 72 hours. Thyroid Function Tests: No results for input(s): TSH, T4TOTAL, FREET4, T3FREE, THYROIDAB in the last 72 hours. Anemia Panel: No results for input(s): VITAMINB12, FOLATE, FERRITIN, TIBC, IRON, RETICCTPCT in the last 72 hours. Urine analysis:    Component Value Date/Time   COLORURINE YELLOW 08/30/2017 0053   APPEARANCEUR CLEAR 08/30/2017 0053   LABSPEC 1.019 08/30/2017 0053   PHURINE 5.0 08/30/2017 0053   GLUCOSEU NEGATIVE 08/30/2017 0053   HGBUR SMALL (A) 08/30/2017 0053   BILIRUBINUR NEGATIVE 08/30/2017 0053   KETONESUR NEGATIVE 08/30/2017 0053   PROTEINUR NEGATIVE 08/30/2017 0053   NITRITE NEGATIVE 08/30/2017 0053   LEUKOCYTESUR TRACE (A) 08/30/2017 0053   Sepsis Labs: Invalid input(s): PROCALCITONIN, LACTICIDVEN  Recent Results (from the past 240 hour(s))  Urine culture     Status: None   Collection Time: 08/30/17 12:53 AM  Result Value Ref Range Status   Specimen Description   Final    URINE, CLEAN CATCH Performed at [facility], [place] [address] [place], [place] [address]    Special Requests   Final     NONE Performed at [facility], [place] [address], [place], [place] [address]    Culture   Final    NO GROWTH Performed at [facility], [place] [address] [place], [place] [address]    Report Status 08/31/2017 FINAL  Final      Radiology Studies: US Scrotum W/doppler  Result Date: 08/29/2017 CLINICAL DATA:  Left testicular swelling for several days EXAM: SCROTAL ULTRASOUND DOPPLER ULTRASOUND OF THE TESTICLES TECHNIQUE: Complete ultrasound examination of the testicles, epididymis, and other scrotal structures was performed. Color and spectral Doppler ultrasound were also utilized to evaluate blood flow to the testicles. COMPARISON:  08/15/2017 FINDINGS: Right testicle Measurements: 4.0 x 1.8 x 3.7 cm. Previously seen heterogeneity of the right testicle is not as well appreciated on today's exam. Improved vascularity is noted. No focal mass is seen. Left testicle Measurements: 4.0 x 1.8 x 3.9 cm. No mass or microlithiasis visualized. Right epididymis:  Normal in size and appearance. Left epididymis:  Normal in size and appearance. Hydrocele: Right-sided hydrocele is noted increased from the prior exam. Varicocele:  None visualized. Pulsed Doppler interrogation of both testes demonstrates normal low resistance arterial and venous waveforms bilaterally. Scrotal wall swelling is noted consistent with the known history of prior scrotal abscess and drainage. IMPRESSION: Resolution of previously seen heterogeneity within the right testicle. Normal-appearing left testicle. Scrotal wall thickening consistent with the known inflammatory changes and recent scrotal abscess drainage. Right-sided hydrocele.  This is increased from the prior exam. Electronically Signed   By: Inez Catalina M.D.   On: 08/29/2017 15:26     Scheduled Meds: . doxycycline  100 mg Oral Q12H  . insulin aspart  0-9 Units Subcutaneous Q4H  . insulin glargine  10 Units Subcutaneous QHS   Continuous Infusions: .  sodium chloride 75 mL/hr at 08/30/17 [place], MD, PhD [facility] Pager [phone]  If 7PM-7AM, please contact night-coverage [email or web address] Password [facility] 08/31/2017, 12:37 PM

## 2017-08-31 NOTE — Progress Notes (Signed)
Patient ID: Spencer Robles, male   DOB: 01/26/1961, 57 y.o.   MRN: [id number]   1 Day Post-Op Subjective: Pt s/p additional debridement of anterior scrotum yesterday due to non-viable tissue. No additional obvious abscess noted. Pain is slightly better today.    Objective: Vital signs in last 24 hours: Temp:  [97.8 F (36.6 C)-99.3 F (37.4 C)] 99 F (37.2 C) (02/08 0415) Pulse Rate:  [65-75] 75 (02/08 0415) Resp:  [8-21] 18 (02/08 0415) BP: (125-142)/(76-89) 141/78 (02/08 0415) SpO2:  [96 %-100 %] 97 % (02/08 0415)  Intake/Output from previous day: 02/07 0701 - 02/08 0700 In: 3792.5 [P.O.:1100; I.V.:2342.5; IV Piggyback:350] Out: 2075 [Urine:2050; Blood:25] Intake/Output this shift: Total I/O In: 840 [P.O.:240; I.V.:300; IV Piggyback:300] Out: 200 [Urine:200]  Physical Exam:  General: Alert and oriented GU: Scrotal wound with fibrinous material over upper anterior scrotal wound as seen yesterday, posterior wound is clean and granulating well.  Persistent edema of penoscrotal skin as expected.  Lab Results: Recent Labs    08/29/17 1431 08/31/17 0505  HGB 11.8* 10.2*  HCT 36.5* 31.3*   CBC Latest Ref Rng & Units 08/31/2017 08/29/2017  WBC 4.0 - 10.5 K/uL 14.7(H) 12.6(H)  Hemoglobin 13.0 - 17.0 g/dL 10.2(L) 11.8(L)  Hematocrit 39.0 - 52.0 % 31.3(L) 36.5(L)  Platelets 150 - 400 K/uL 484(H) 572(H)     BMET Recent Labs    08/29/17 1431 08/31/17 0505  NA 137 131*  K 4.4 4.0  CL 100* 96*  CO2 31 28  GLUCOSE 96 184*  BUN 12 12  CREATININE 0.80 0.86  CALCIUM 9.1 8.6*     Studies/Results:   Assessment/Plan: MRSA scrotal abscess  - Non-viable tissue removed yesterday to assist with healing.  Continue appropriate culture specific antibiotic therapy per prior cultures (no additional cultures obtained yesterday) - he had previously been on doxycycline.  WBC increase likely reflective of inflammatory response from surgery yesterday. - Pt has had a severe and unusual  MRSA scrotal infection.  He requires excellent wound care now.  I have serious reservations that he will be able to perform his own wound care at home and will be at very high risk for returning to the hospital again in a couple of days.  He may benefit from staying in the hospital over the weekend for good wound care and to reassess for any further need for surgical intervention.  However, if and when he is discharged, he would definitely benefit from assistance with wound care either at [facility] or with home health nursing.     LOS: 1 day   Yasuo Phimmasone,LES 08/31/2017, 11:58 AM

## 2017-08-31 NOTE — Clinical Social Work Note (Signed)
Clinical Social Work Assessment  Patient Details  Name: Spencer Robles MRN: [id number] Date of Birth: 1960-10-16  Date of referral:  08/31/17               Reason for consult:  Facility Placement                Permission sought to share information with:  Facility Sport and exercise psychologist, Family Supports Permission granted to share information::  Yes, Verbal Permission Granted  Name::     Spencer Robles; [place]::     Relationship::  Wife; Spencer Robles Information:  [phone]; [phone]  Housing/Transportation Living arrangements for the past 2 months:  Apartment Source of Information:  Patient Patient Interpreter Needed:  None Criminal Activity/Legal Involvement Pertinent to Current Situation/Hospitalization:  No - Comment as needed Significant Relationships:  Other Family Members, Spouse Lives with:  Self Do you feel safe going back to the place where you live?  No Need for family participation in patient care:  No (Coment)  Care giving concerns:  Patient from home alone. Patient reported that he is independent with ADLs and ambulation. Patient reported that he is unable to do his wound care at home and is afraid of getting an infection.   Social Worker assessment / plan:  CSW spoke with patient at bedside regarding discharge planning. Patient reported that he is from home alone and unable to complete wound care at home. Patient reported that he is agreeable to go to a SNF for wound care. Patient reported that he is currently on leave from [employer] where he has been working since 05/28/2015. Patient reports that he currently has no income and his medicaid is pending. Patient reported that he has family in the area that are supportive but do not come over to check on him. Patient reported that he has wife that he is separated from that he is still in contact with. Patient granted CSW verbal permission to contact his wife and cousin if needed.  CSW staffed case  CSW Surveyor, quantity. CSW Surveyor, quantity advised CSW to search for SNF placement for patient utilizing 30 day LOG.  CSW completed FL2 and will follow up with SNFs to see who is able to Robles patient a bed.  CSW will continue to follow and assist with discharge planning.   Employment status:  Unemployed Forensic scientist:  Self Pay (Medicaid Pending) PT Recommendations:  Not assessed at this time Information / Referral to community resources:  [place]  Patient/Family's Response to care:  Patient appreciative of CSW assistance with discharge planning.   Patient/Family's Understanding of and Emotional Response to Diagnosis, Current Treatment, and Prognosis:  Patient presented calm and appropriate. Patient verbalized understanding of diagnosis and current treatment, consistently expressing fear of his wound getting infected. CSW validated patient's concerns. Patient verbalized plan to dc to SNF for wound care. Patient hopeful that his wound will heal and he can return to work.   Emotional Assessment Appearance:  Appears stated age Attitude/Demeanor/Rapport:  Other(Cooperative) Affect (typically observed):  Calm, Appropriate Orientation:  Oriented to Self, Oriented to Place, Oriented to  Time, Oriented to Situation Alcohol / Substance use:  Not Applicable Psych involvement (Current and /or in the community):  No (Comment)  Discharge Needs  Concerns to be addressed:  Care Coordination Readmission within the last 30 days:  Yes Current discharge risk:  Other, Lives alone(Needs Wound Care) Barriers to Discharge:  Continued Medical Work up   [employer]  08/31/2017, 5:16 PM

## 2017-08-31 NOTE — Anesthesia Postprocedure Evaluation (Signed)
Anesthesia Post Note  Patient: Spencer Robles  Procedure(s) Performed: SCROTUM DEBRIDEMENT (N/A )     Patient location during evaluation: PACU Anesthesia Type: General Level of consciousness: awake and alert Pain management: pain level controlled Vital Signs Assessment: post-procedure vital signs reviewed and stable Respiratory status: spontaneous breathing, nonlabored ventilation, respiratory function stable and patient connected to nasal cannula oxygen Cardiovascular status: blood pressure returned to baseline and stable Postop Assessment: no apparent nausea or vomiting Anesthetic complications: no    Last Vitals:  Vitals:   08/30/17 2359 08/31/17 0415  BP: 133/76 (!) 141/78  Pulse: 71 75  Resp: 16 18  Temp: 37.2 C 37.2 C  SpO2: 96% 97%    Last Pain:  Vitals:   08/31/17 0415  TempSrc: Oral  PainSc:                  Makinze Jani DAVID

## 2017-09-01 LAB — GLUCOSE, CAPILLARY
GLUCOSE-CAPILLARY: 156 mg/dL — AB (ref 65–99)
GLUCOSE-CAPILLARY: 184 mg/dL — AB (ref 65–99)
GLUCOSE-CAPILLARY: 214 mg/dL — AB (ref 65–99)
Glucose-Capillary: 121 mg/dL — ABNORMAL HIGH (ref 65–99)
Glucose-Capillary: 157 mg/dL — ABNORMAL HIGH (ref 65–99)
Glucose-Capillary: 162 mg/dL — ABNORMAL HIGH (ref 65–99)

## 2017-09-01 MED ORDER — INSULIN ASPART 100 UNIT/ML ~~LOC~~ SOLN
0.0000 [IU] | Freq: Three times a day (TID) | SUBCUTANEOUS | Status: DC
  Administered 2017-09-01: 2 [IU] via SUBCUTANEOUS
  Administered 2017-09-01: 3 [IU] via SUBCUTANEOUS
  Administered 2017-09-01: 2 [IU] via SUBCUTANEOUS
  Administered 2017-09-02: 1 [IU] via SUBCUTANEOUS
  Administered 2017-09-02: 3 [IU] via SUBCUTANEOUS
  Administered 2017-09-02 (×2): 2 [IU] via SUBCUTANEOUS
  Administered 2017-09-03: 1 [IU] via SUBCUTANEOUS
  Administered 2017-09-03 – 2017-09-04 (×5): 2 [IU] via SUBCUTANEOUS

## 2017-09-01 NOTE — Progress Notes (Signed)
Patient ID: Spencer Robles, male   DOB: April 05, 1961, 57 y.o.   MRN: [id number] 2 Days Post-Op  Assessment: MRSA scrotal skin infection: He appears to be stabilizing with no progression of skin necrosis.  The fibrinous material in the superior aspect of the scrotum appears to be unchanged but not progressing.  Plan: Continue wet-to-dry dressings   Subjective: Patient is without complaint.  Specifically he reports he is not having any significant scrotal pain.  Objective: Vital signs in last 24 hours: Temp:  [98.4 F (36.9 C)-99.4 F (37.4 C)] 98.4 F (36.9 C) (02/09 0508) Pulse Rate:  [62-77] 62 (02/09 0508) Resp:  [18] 18 (02/09 0508) BP: (128-140)/(70-82) 140/75 (02/09 0508) SpO2:  [98 %-99 %] 99 % (02/09 0508)  Intake/Output from previous day: 02/08 0701 - 02/09 0700 In: 4692.5 [P.O.:1460; I.V.:2682.5; IV Piggyback:550] Out: 2100 [Urine:2100] Intake/Output this shift: Total I/O In: 240 [P.O.:240] Out: 700 [Urine:700]  Past Medical History:  Diagnosis Date  . Diabetes mellitus without complication (IXL)    Current Facility-Administered Medications  Medication Dose Route Frequency Provider Last Rate Last Dose  . 0.9 %  sodium chloride infusion   Intravenous Continuous Etta Quill, DO 75 mL/hr at 09/01/17 4656    . doxycycline (VIBRAMYCIN) 100 mg in dextrose 5 % 250 mL IVPB  100 mg Intravenous Q12H Caren Griffins, MD 125 mL/hr at 09/01/17 1015 100 mg at 09/01/17 1015  . enoxaparin (LOVENOX) injection 40 mg  40 mg Subcutaneous Daily Caren Griffins, MD   40 mg at 09/01/17 1016  . insulin aspart (novoLOG) injection 0-9 Units  0-9 Units Subcutaneous TID AC & HS Gherghe, Costin M, MD      . insulin glargine (LANTUS) injection 10 Units  10 Units Subcutaneous QHS Etta Quill, DO   10 Units at 08/31/17 2018  . oxyCODONE (Oxy IR/ROXICODONE) immediate release tablet 5-10 mg  5-10 mg Oral Q4H PRN Caren Griffins, MD   10 mg at 09/01/17 0818    Physical Exam:  General:  Patient is in no apparent distress Lungs: Normal respiratory effort, chest expands symmetrically. GI: The abdomen is soft and nontender without mass. GU: Significant penile shaft skin edema.  Scrotum reveals no erythema.  The scrotal wound has some fibrinous material in the superior aspect with no obvious evidence of skin necrosis or progression.  The remainder of the wound more inferiorly appears to be granulating.    Lab Results: Recent Labs    08/29/17 1431 08/31/17 0505  WBC 12.6* 14.7*  HGB 11.8* 10.2*  HCT 36.5* 31.3*   BMET Recent Labs    08/29/17 1431 08/31/17 0505  NA 137 131*  K 4.4 4.0  CL 100* 96*  CO2 31 28  GLUCOSE 96 184*  BUN 12 12  CREATININE 0.80 0.86  CALCIUM 9.1 8.6*   No results for input(s): LABPT, INR in the last 72 hours. No results for input(s): LABURIN in the last 72 hours. Results for orders placed or performed during the hospital encounter of 08/29/17  Urine culture     Status: None   Collection Time: 08/30/17 12:53 AM  Result Value Ref Range Status   Specimen Description   Final    URINE, CLEAN CATCH Performed at [facility], [place] [address], [place], [place] [address]    Special Requests   Final    NONE Performed at [facility], [place] [address] [place], [place] [address]    Culture   Final  NO GROWTH Performed at [facility], [place] [address] [place], [place] [address]    Report Status 08/31/2017 FINAL  Final    Studies/Results: No results found.     Maryon Kemnitz C 09/01/2017, 10:36 AM

## 2017-09-01 NOTE — Plan of Care (Signed)
Patient stable during 7 a to 7 p shift, medicated for pain in scrotum x 2 with improvement.  Staff packing wound qshift as per order, patient ambulated x 2 to bathroom at which time packing falls out which patient relates is another hinderance to him taking care of his own wound at home.  Patients wife and son in to visit during this shift, patient relates they have been separated since 2011.

## 2017-09-01 NOTE — Progress Notes (Signed)
PROGRESS NOTE  Spencer Robles TIW:[id number] DOB: [birth year] DOA: 08/29/2017 PCP: Spencer Robles, No Pcp Per   LOS: 2 days   Brief Narrative / Interim history: 57 year old male with poorly controlled diabetes who came in with recurrent scrotal infection.He was admitted to the hospital at the end of January with scrotal cellulitis, he failed to improve on IV antibiotics and was eventually taken to the operating room by urology for I&D, he is growing MRSA.  He was discharged home on doxycycline based on sensitivities, however Spencer Robles had difficulties in doing dressing changes, ran out of supplies, is once started having more discharge and decided to come back.  He was taken again to the operating room on 2/7 for further debridement.  Assessment & Plan: Active Problems:   Scrotal abscess   Abscess    MRSA positive scrotal abscess/cellulitis -Status post hospitalization for the same, was discharged home and came back a few days later.  -Spencer Robles taken again to the operating room on 2/7 for further debridement -Appreciate urology assistance -I agree with urology that he is a very high risk for readmission as he has poor social support for him to appropriately do his dressing changes at home.  Discussed with case manager and social worker to see how can be best help the Spencer Robles -Agree with urology, will keep over the weekend given the severity of his MRSA infection and readmission within few days. -Discontinue vancomycin and Zosyn, placed on IV doxycycline -will need a very safe discharge plan given wound care needs  Poorly controlled diabetes mellitus -Most recent hemoglobin A1c was in the 14 range in December 2018 continue Lantus and sliding scale, currently sugars are well controlled -Discussed with Spencer Robles extensively at bedside regarding the fact that he would need to much better control of his diabetes at home as this probably is more of the main reasons why he is hospitalized currently with  #1    DVT prophylaxis: Lovenox Code Status: Full code Family Communication: no family at bedside Disposition Plan: home vs SNF Monday-Tuesday   Consultants:   Urology   Procedures:   I&D 2/7  Antimicrobials:  Vancomycin 2/7 >> 2/8  Zosyn 2/7 >> 2/8  Doxycycline 2/8 >>  Subjective: - no chest pain, shortness of breath, no abdominal pain, nausea or vomiting.    Objective: Vitals:   08/31/17 0415 08/31/17 1300 08/31/17 2017 09/01/17 0508  BP: (!) 141/78 140/70 128/82 140/75  Pulse: 75 77 63 62  Resp: 18 18  18   Temp: 99 F (37.2 C) 99.1 F (37.3 C) 99.4 F (37.4 C) 98.4 F (36.9 C)  TempSrc: Oral Oral Oral Oral  SpO2: 97% 98% 98% 99%  Weight:      Height:        Intake/Output Summary (Last 24 hours) at 09/01/2017 1348 Last data filed at 09/01/2017 1100 Gross per 24 hour  Intake 4092.5 ml  Output 2750 ml  Net 1342.5 ml   Filed Weights   08/30/17 0217  Weight: 103.8 kg (228 lb 13.4 oz)    Examination:  Constitutional:NAD Respiratory: CTA biL  Cardiovascular: RRR   Data Reviewed: I have independently reviewed following labs and imaging studies   CBC: Recent Labs  Lab 08/29/17 1431 08/31/17 0505  WBC 12.6* 14.7*  NEUTROABS 8.9*  --   HGB 11.8* 10.2*  HCT 36.5* 31.3*  MCV 85.3 84.1  PLT 572* 250*   Basic Metabolic Panel: Recent Labs  Lab 08/29/17 1431 08/31/17 0505  NA 137 131*  K 4.4 4.0  CL 100* 96*  CO2 31 28  GLUCOSE 96 184*  BUN 12 12  CREATININE 0.80 0.86  CALCIUM 9.1 8.6*   GFR: Estimated Creatinine Clearance: 115.7 mL/min (by C-G formula based on SCr of 0.86 mg/dL). Liver Function Tests: No results for input(s): AST, ALT, ALKPHOS, BILITOT, PROT, ALBUMIN in the last 168 hours. No results for input(s): LIPASE, AMYLASE in the last 168 hours. No results for input(s): AMMONIA in the last 168 hours. Coagulation Profile: No results for input(s): INR, PROTIME in the last 168 hours. Cardiac Enzymes: No results for input(s):  CKTOTAL, CKMB, CKMBINDEX, TROPONINI in the last 168 hours. BNP (last 3 results) No results for input(s): PROBNP in the last 8760 hours. HbA1C: No results for input(s): HGBA1C in the last 72 hours. CBG: Recent Labs  Lab 08/31/17 2016 09/01/17 0010 09/01/17 0505 09/01/17 0741 09/01/17 1151  GLUCAP 166* 184* 156* 121* 157*   Lipid Profile: No results for input(s): CHOL, HDL, LDLCALC, TRIG, CHOLHDL, LDLDIRECT in the last 72 hours. Thyroid Function Tests: No results for input(s): TSH, T4TOTAL, FREET4, T3FREE, THYROIDAB in the last 72 hours. Anemia Panel: No results for input(s): VITAMINB12, FOLATE, FERRITIN, TIBC, IRON, RETICCTPCT in the last 72 hours. Urine analysis:    Component Value Date/Time   COLORURINE YELLOW 08/30/2017 0053   APPEARANCEUR CLEAR 08/30/2017 0053   LABSPEC 1.019 08/30/2017 0053   PHURINE 5.0 08/30/2017 0053   GLUCOSEU NEGATIVE 08/30/2017 0053   HGBUR SMALL (A) 08/30/2017 0053   BILIRUBINUR NEGATIVE 08/30/2017 0053   KETONESUR NEGATIVE 08/30/2017 0053   PROTEINUR NEGATIVE 08/30/2017 0053   NITRITE NEGATIVE 08/30/2017 0053   LEUKOCYTESUR TRACE (A) 08/30/2017 0053   Sepsis Labs: Invalid input(s): PROCALCITONIN, LACTICIDVEN  Recent Results (from the past 240 hour(s))  Urine culture     Status: None   Collection Time: 08/30/17 12:53 AM  Result Value Ref Range Status   Specimen Description   Final    URINE, CLEAN CATCH Performed at [facility], [place] [address], [place], [place] [address]    Special Requests   Final    NONE Performed at [facility], [place] [address] [place], [place] [address]    Culture   Final    NO GROWTH Performed at [facility], [place] [address], [place], [place] [address]    Report Status 08/31/2017 FINAL  Final      Radiology Studies: No results found.   Scheduled Meds: . enoxaparin (LOVENOX) injection  40 mg Subcutaneous Daily  . insulin aspart  0-9 Units Subcutaneous TID AC &  HS  . insulin glargine  10 Units Subcutaneous QHS   Continuous Infusions: . sodium chloride 75 mL/hr at 09/01/17 0509  . doxycycline (VIBRAMYCIN) IV Stopped (09/01/17 1215)    Marzetta Board, MD, PhD [facility] Pager [phone]  If 7PM-7AM, please contact night-coverage [email or web address] Password [email or web address] 09/01/2017, 1:48 PM

## 2017-09-02 LAB — GLUCOSE, CAPILLARY
GLUCOSE-CAPILLARY: 138 mg/dL — AB (ref 65–99)
GLUCOSE-CAPILLARY: 175 mg/dL — AB (ref 65–99)
GLUCOSE-CAPILLARY: 218 mg/dL — AB (ref 65–99)
Glucose-Capillary: 171 mg/dL — ABNORMAL HIGH (ref 65–99)

## 2017-09-02 LAB — BASIC METABOLIC PANEL
ANION GAP: 8 (ref 5–15)
BUN: 9 mg/dL (ref 6–20)
CO2: 25 mmol/L (ref 22–32)
Calcium: 8.5 mg/dL — ABNORMAL LOW (ref 8.9–10.3)
Chloride: 98 mmol/L — ABNORMAL LOW (ref 101–111)
Creatinine, Ser: 0.74 mg/dL (ref 0.61–1.24)
Glucose, Bld: 166 mg/dL — ABNORMAL HIGH (ref 65–99)
POTASSIUM: 3.9 mmol/L (ref 3.5–5.1)
SODIUM: 131 mmol/L — AB (ref 135–145)

## 2017-09-02 LAB — CBC
HCT: 31.9 % — ABNORMAL LOW (ref 39.0–52.0)
HEMOGLOBIN: 10.5 g/dL — AB (ref 13.0–17.0)
MCH: 27.7 pg (ref 26.0–34.0)
MCHC: 32.9 g/dL (ref 30.0–36.0)
MCV: 84.2 fL (ref 78.0–100.0)
PLATELETS: 369 10*3/uL (ref 150–400)
RBC: 3.79 MIL/uL — AB (ref 4.22–5.81)
RDW: 14.1 % (ref 11.5–15.5)
WBC: 9.2 10*3/uL (ref 4.0–10.5)

## 2017-09-02 MED ORDER — POLYETHYLENE GLYCOL 3350 17 G PO PACK
17.0000 g | PACK | Freq: Every day | ORAL | Status: DC
  Administered 2017-09-02 – 2017-09-04 (×3): 17 g via ORAL
  Filled 2017-09-02 (×3): qty 1

## 2017-09-02 NOTE — Progress Notes (Signed)
Wound @ scrotum cleaned with saline and packed with wet to dry gauze and ABD pad and clean underwear.Packing wound was also performed.

## 2017-09-02 NOTE — Plan of Care (Signed)
VSS, patient medicated for pain x 2 this shift with improvement.  Wound care being provided by RN's several times a shift as every time patient gets up to use urinal or use bathroom a large amount of dressing has to be replaced.  Patient anxious regarding what will happen with him, sees a need to go to a facility that can provide wound care but is worried he will be sent far away from [place] where he has some cousins and his 59 year old son.

## 2017-09-02 NOTE — Progress Notes (Signed)
Patient ID: Spencer Robles, male   DOB: 12-11-1960, 57 y.o.   MRN: [id number]    Assessment: MRSA scrotal skin infection: There is a very thin rim of necrotic skin that has developed over the past 24 hours without any evidence of what appears to be erythema or fluctuance associated with this.  The base of the wound superiorly still has fibrinous material although I suspect this would respond, over time, to wet-to-dry dressings.  I will discuss my findings with Dr. Alinda Money later today but at this time I am not sure any further debridement is necessary.  Plan: 1.  Continue wet-to-dry dressings. 2.  We will discuss the need for further surgery with Dr. Alinda Money.    Subjective: Patient reports no new complaints today.  Objective: Vital signs in last 24 hours: Temp:  [99.1 F (37.3 C)-99.7 F (37.6 C)] 99.1 F (37.3 C) (02/10 0500) Pulse Rate:  [59-68] 59 (02/10 0500) Resp:  [16-18] 16 (02/10 0500) BP: (137-145)/(75-84) 145/75 (02/10 0500) SpO2:  [95 %-98 %] 98 % (02/10 0500)A  Intake/Output from previous day: 02/09 0701 - 02/10 0700 In: 2988.8 [P.O.:720; I.V.:1768.8; IV Piggyback:500] Out: 2450 [Urine:2450] Intake/Output this shift: No intake/output data recorded.  Past Medical History:  Diagnosis Date  . Diabetes mellitus without complication [facility])     Physical Exam:  Lungs - Normal respiratory effort, chest expands symmetrically.  Abdomen - Soft, non-tender & non-distended. GU - Penis with edema that is unchanged.  Scrotum reveals a thin (2-3 mm) rim of necrotic skin involving the right upper portion of the wound over a span of about 4-5 cm with continued fibrinous appearing material at the base of the wound in its superior aspect with no change in the granulation in the lower aspect of the wound.  Lab Results: Recent Labs    08/31/17 0505 09/02/17 0520  WBC 14.7* 9.2  HGB 10.2* 10.5*  HCT 31.3* 31.9*   BMET Recent Labs    08/31/17 0505 09/02/17 0520  NA 131* 131*  K  4.0 3.9  CL 96* 98*  CO2 28 25  GLUCOSE 184* 166*  BUN 12 9  CREATININE 0.86 0.74  CALCIUM 8.6* 8.5*   No results for input(s): LABURIN in the last 72 hours. Results for orders placed or performed during the hospital encounter of 08/29/17  Urine culture     Status: None   Collection Time: 08/30/17 12:53 AM  Result Value Ref Range Status   Specimen Description   Final    URINE, CLEAN CATCH Performed at [facility], [place] [address] [place], [place] [address]    Special Requests   Final    NONE Performed at [facility], [place] [address], [place], [place] [address]    Culture   Final    NO GROWTH Performed at [facility], [place] [address], [place], [place] [address]    Report Status 08/31/2017 FINAL  Final    Studies/Results: No results found.    Justino Boze C 09/02/2017, 7:17 AM

## 2017-09-02 NOTE — Progress Notes (Signed)
[facility]                                                                              Patient Demographics  Spencer Robles, is a 57 y.o. male, DOB - 1961-02-25, FUX:[id number]  Admit date - 08/29/2017   Admitting Physician Etta Quill, DO  Outpatient Primary MD for the patient is Patient, No Pcp Per  Outpatient specialists:   LOS - 3  days   Medical records reviewed and are as summarized below:    Chief Complaint  Patient presents with  . Penile Discharge       Brief summary   57 year old male with poorly controlled diabetes who came in with recurrent scrotal infection.He was admitted to the hospital at the end of January with scrotal cellulitis, he failed to improve on IV antibiotics and was eventually taken to the operating room by urology for I&D, he is growing MRSA.  He was discharged home on doxycycline based on sensitivities, however patient had difficulties in doing dressing changes, ran out of supplies, is once started having more discharge and decided to come back.  He was taken again to the operating room on 2/7 for further debridement.   Assessment & Plan    MRSA positive scrotal abscess/cellulitis -Status post recent hospitalization at the end of January with scrotal cellulitis, I&D of the scrotal abscess, was DC'd on 2/4.  Cultures grew MRSA.  He will return back to the hospital complaining of white area on his scrotum, draining from the wound. - Status post-debridement by urology on 2/7 -Initially placed on vancomycin and Zosyn, currently on IV doxycycline -Urology following closely, recommended wet-to-dry dressing changes, will discuss need for further surgery with Dr. Alinda Money -High risk for readmission due to poor social support, patient reports difficult to do dressing changes at home, ran out of his supplies, has no transport or insurance, financial issues.   -will need a very safe discharge plan given wound care needs, will likely benefit  from skilled nursing facility if available for wound care.   Poorly controlled diabetes mellitus - will check hemoglobin A1c  - Continue Lantus, sliding scale insulin.   - Doubt if patient is taking adequate care of his diabetes.   Code Status: Full CODE STATUS DVT Prophylaxis:  Lovenox  Family Communication: Discussed in detail with the patient, all imaging results, lab results explained to the patient   Disposition Plan:  Time Spent in minutes 25 minutes  Procedures:  I&D  Consultants:   Urology  Antimicrobials:   IV doxycycline   Medications  Scheduled Meds: . enoxaparin (LOVENOX) injection  40 mg Subcutaneous Daily  . insulin aspart  0-9 Units Subcutaneous TID AC & HS  . insulin glargine  10 Units Subcutaneous QHS   Continuous Infusions: . sodium chloride 75 mL/hr at 09/01/17 0509  . doxycycline (VIBRAMYCIN) IV 100 mg (09/02/17 0935)   PRN Meds:.oxyCODONE   Antibiotics   Anti-infectives (From admission, onward)   Start     Dose/Rate Route Frequency Ordered Stop   08/31/17 2200  doxycycline (VIBRAMYCIN) 100 mg in dextrose 5 % 250 mL IVPB     100  mg 125 mL/hr over 120 Minutes Intravenous Every 12 hours 08/31/17 1241     08/31/17 1200  doxycycline (VIBRA-TABS) tablet 100 mg  Status:  Discontinued     100 mg Oral Every 12 hours 08/31/17 1043 08/31/17 1241   08/30/17 2200  vancomycin (VANCOCIN) 1,250 mg in sodium chloride 0.9 % 250 mL IVPB  Status:  Discontinued     1,250 mg 166.7 mL/hr over 90 Minutes Intravenous Every 12 hours 08/30/17 0337 08/31/17 1043   08/30/17 0800  piperacillin-tazobactam (ZOSYN) IVPB 3.375 g  Status:  Discontinued     3.375 g 12.5 mL/hr over 240 Minutes Intravenous Every 8 hours 08/30/17 0337 08/31/17 1043   08/30/17 0315  vancomycin (VANCOCIN) 2,000 mg in sodium chloride 0.9 % 500 mL IVPB     2,000 mg 250 mL/hr over 120 Minutes Intravenous  Once 08/30/17 0253 08/30/17 0511   08/30/17 0100  vancomycin (VANCOCIN) IVPB 1000 mg/200 mL  premix  Status:  Discontinued     1,000 mg 200 mL/hr over 60 Minutes Intravenous  Once 08/30/17 0021 08/30/17 0357   08/30/17 0030  piperacillin-tazobactam (ZOSYN) IVPB 3.375 g     3.375 g 100 mL/hr over 30 Minutes Intravenous  Once 08/30/17 0021 08/30/17 0326        Subjective:   Spencer Robles was seen and examined today.  Feels overwhelmed with the dressing changes. Patient denies dizziness, chest pain, shortness of breath, abdominal pain, N/V/D/C, new weakness, numbess, tingling. No acute events overnight.    Objective:   Vitals:   09/01/17 0508 09/01/17 1437 09/01/17 2145 09/02/17 0500  BP: 140/75 139/84 137/75 (!) 145/75  Pulse: 62 65 68 (!) 59  Resp: 18 18 18 16   Temp: 98.4 F (36.9 C) 99.5 F (37.5 C) 99.7 F (37.6 C) 99.1 F (37.3 C)  TempSrc: Oral Oral Oral Oral  SpO2: 99% 95% 98% 98%  Weight:      Height:        Intake/Output Summary (Last 24 hours) at 09/02/2017 1157 Last data filed at 09/02/2017 0630 Gross per 24 hour  Intake 2757.5 ml  Output 1600 ml  Net 1157.5 ml     Wt Readings from Last 3 Encounters:  08/30/17 103.8 kg (228 lb 13.4 oz)     Exam  General: Alert and oriented x 3, NAD  Eyes:   HEENT:  Atraumatic, normocephalic  Cardiovascular: S1 S2 auscultated, no rubs, murmurs or gallops. Regular rate and rhythm.  Respiratory: Clear to auscultation bilaterally, no wheezing, rales or rhonchi  Gastrointestinal: Soft, nontender, nondistended, + bowel sounds  Ext: no pedal edema bilaterally  Neuro: AAOx3, Cr N's II- XII. Strength 5/5 upper and lower extremities bilaterally,   Musculoskeletal: No digital cyanosis, clubbing  Skin/GU: Scrotum and penal edema.  Significant necrotic skin 4-5cm ulcer with fibrinous material on upper and posterior scrotum   Psych: Normal affect and demeanor, alert and oriented x3    Data Reviewed:  I have personally reviewed following labs and imaging studies  Micro Results Recent Results (from the past  240 hour(s))  Urine culture     Status: None   Collection Time: 08/30/17 12:53 AM  Result Value Ref Range Status   Specimen Description   Final    URINE, CLEAN CATCH Performed at [facility] [address] [place], [place] [address]    Special Requests   Final    NONE Performed at [facility], [place] [address] [place], [place] [address]  Culture   Final    NO GROWTH Performed at [facility], [place] [address] [place], [place] [address]    Report Status 08/31/2017 FINAL  Final    Radiology Reports US Scrotum W/doppler  Result Date: 08/29/2017 CLINICAL DATA:  Left testicular swelling for several days EXAM: SCROTAL ULTRASOUND DOPPLER ULTRASOUND OF THE TESTICLES TECHNIQUE: Complete ultrasound examination of the testicles, epididymis, and other scrotal structures was performed. Color and spectral Doppler ultrasound were also utilized to evaluate blood flow to the testicles. COMPARISON:  08/15/2017 FINDINGS: Right testicle Measurements: 4.0 x 1.8 x 3.7 cm. Previously seen heterogeneity of the right testicle is not as well appreciated on today's exam. Improved vascularity is noted. No focal mass is seen. Left testicle Measurements: 4.0 x 1.8 x 3.9 cm. No mass or microlithiasis visualized. Right epididymis:  Normal in size and appearance. Left epididymis:  Normal in size and appearance. Hydrocele: Right-sided hydrocele is noted increased from the prior exam. Varicocele:  None visualized. Pulsed Doppler interrogation of both testes demonstrates normal low resistance arterial and venous waveforms bilaterally. Scrotal wall swelling is noted consistent with the known history of prior scrotal abscess and drainage. IMPRESSION: Resolution of previously seen heterogeneity within the right testicle. Normal-appearing left testicle. Scrotal wall thickening consistent with the known inflammatory changes and recent scrotal abscess drainage. Right-sided hydrocele.  This  is increased from the prior exam. Electronically Signed   By: Inez Catalina M.D.   On: 08/29/2017 15:26    Lab Data:  CBC: Recent Labs  Lab 08/29/17 1431 08/31/17 0505 09/02/17 0520  WBC 12.6* 14.7* 9.2  NEUTROABS 8.9*  --   --   HGB 11.8* 10.2* 10.5*  HCT 36.5* 31.3* 31.9*  MCV 85.3 84.1 84.2  PLT 572* 484* 834   Basic Metabolic Panel: Recent Labs  Lab 08/29/17 1431 08/31/17 0505 09/02/17 0520  NA 137 131* 131*  K 4.4 4.0 3.9  CL 100* 96* 98*  CO2 31 28 25   GLUCOSE 96 184* 166*  BUN 12 12 9   CREATININE 0.80 0.86 0.74  CALCIUM 9.1 8.6* 8.5*   GFR: Estimated Creatinine Clearance: 124.4 mL/min (by C-G formula based on SCr of 0.74 mg/dL). Liver Function Tests: No results for input(s): AST, ALT, ALKPHOS, BILITOT, PROT, ALBUMIN in the last 168 hours. No results for input(s): LIPASE, AMYLASE in the last 168 hours. No results for input(s): AMMONIA in the last 168 hours. Coagulation Profile: No results for input(s): INR, PROTIME in the last 168 hours. Cardiac Enzymes: No results for input(s): CKTOTAL, CKMB, CKMBINDEX, TROPONINI in the last 168 hours. BNP (last 3 results) No results for input(s): PROBNP in the last 8760 hours. HbA1C: No results for input(s): HGBA1C in the last 72 hours. CBG: Recent Labs  Lab 09/01/17 0741 09/01/17 1151 09/01/17 1706 09/01/17 2143 09/02/17 0745  GLUCAP 121* 157* 214* 162* 138*   Lipid Profile: No results for input(s): CHOL, HDL, LDLCALC, TRIG, CHOLHDL, LDLDIRECT in the last 72 hours. Thyroid Function Tests: No results for input(s): TSH, T4TOTAL, FREET4, T3FREE, THYROIDAB in the last 72 hours. Anemia Panel: No results for input(s): VITAMINB12, FOLATE, FERRITIN, TIBC, IRON, RETICCTPCT in the last 72 hours. Urine analysis:    Component Value Date/Time   COLORURINE YELLOW 08/30/2017 0053   APPEARANCEUR CLEAR 08/30/2017 0053   LABSPEC 1.019 08/30/2017 0053   PHURINE 5.0 08/30/2017 0053   GLUCOSEU NEGATIVE 08/30/2017 0053   HGBUR  SMALL (A) 08/30/2017 0053   BILIRUBINUR NEGATIVE 08/30/2017 0053   KETONESUR NEGATIVE 08/30/2017  [place] 08/30/2017 0053   NITRITE NEGATIVE 08/30/2017 0053   LEUKOCYTESUR TRACE (A) 08/30/2017 0053     Estill Cotta M.D. [facility] 09/02/2017, 11:57 AM  Pager: [phone] Between 7am to 7pm - call Pager - [phone]  After 7pm go to [email or web address] - password [email or web address]  Call night coverage person covering after 7pm

## 2017-09-03 LAB — CBC
HCT: 33 % — ABNORMAL LOW (ref 39.0–52.0)
HEMOGLOBIN: 10.9 g/dL — AB (ref 13.0–17.0)
MCH: 28 pg (ref 26.0–34.0)
MCHC: 33 g/dL (ref 30.0–36.0)
MCV: 84.8 fL (ref 78.0–100.0)
Platelets: 330 10*3/uL (ref 150–400)
RBC: 3.89 MIL/uL — ABNORMAL LOW (ref 4.22–5.81)
RDW: 14 % (ref 11.5–15.5)
WBC: 8.9 10*3/uL (ref 4.0–10.5)

## 2017-09-03 LAB — BASIC METABOLIC PANEL
Anion gap: 9 (ref 5–15)
BUN: 12 mg/dL (ref 6–20)
CHLORIDE: 96 mmol/L — AB (ref 101–111)
CO2: 25 mmol/L (ref 22–32)
CREATININE: 0.74 mg/dL (ref 0.61–1.24)
Calcium: 8.8 mg/dL — ABNORMAL LOW (ref 8.9–10.3)
GFR calc Af Amer: >60 mL/min (ref >60)
GFR calc non Af Amer: >60 mL/min (ref >60)
GLUCOSE: 187 mg/dL — AB (ref 65–99)
Potassium: 4 mmol/L (ref 3.5–5.1)
SODIUM: 130 mmol/L — AB (ref 135–145)

## 2017-09-03 LAB — HEMOGLOBIN A1C
Hgb A1c MFr Bld: 8.1 % — ABNORMAL HIGH (ref 4.8–5.6)
Mean Plasma Glucose: 185.77 mg/dL

## 2017-09-03 LAB — GLUCOSE, CAPILLARY
GLUCOSE-CAPILLARY: 170 mg/dL — AB (ref 65–99)
GLUCOSE-CAPILLARY: 166 mg/dL — AB (ref 65–99)
Glucose-Capillary: 166 mg/dL — ABNORMAL HIGH (ref 65–99)
Glucose-Capillary: 142 mg/dL — ABNORMAL HIGH (ref 65–99)

## 2017-09-03 MED ORDER — SODIUM CHLORIDE 0.9 % IV SOLN
100.0000 mg | Freq: Two times a day (BID) | INTRAVENOUS | Status: DC
  Administered 2017-09-03 – 2017-09-04 (×3): 100 mg via INTRAVENOUS
  Filled 2017-09-03 (×4): qty 100

## 2017-09-03 MED ORDER — INSULIN GLARGINE 100 UNIT/ML ~~LOC~~ SOLN
13.0000 [IU] | Freq: Every day | SUBCUTANEOUS | Status: DC
  Administered 2017-09-03: 13 [IU] via SUBCUTANEOUS
  Filled 2017-09-03 (×2): qty 0.13

## 2017-09-03 NOTE — Progress Notes (Signed)
[facility]                                                                              Patient Demographics  Spencer Robles, is a 57 y.o. male, DOB - 25-Sep-1960, CHE:[id number]  Admit date - 08/29/2017   Admitting Physician Etta Quill, DO  Outpatient Primary MD for the patient is Patient, No Pcp Per  Outpatient specialists:   LOS - 4  days   Medical records reviewed and are as summarized below:    Chief Complaint  Patient presents with  . Penile Discharge       Brief summary   57 year old male with poorly controlled diabetes who came in with recurrent scrotal infection.He was admitted to the hospital at the end of January with scrotal cellulitis, he failed to improve on IV antibiotics and was eventually taken to the operating room by urology for I&D, he is growing MRSA.  He was discharged home on doxycycline based on sensitivities, however patient had difficulties in doing dressing changes, ran out of supplies, is once started having more discharge and decided to come back.  He was taken again to the operating room on 2/7 for further debridement.   Assessment & Plan    MRSA positive scrotal abscess/cellulitis -Status post recent hospitalization at the end of January with scrotal cellulitis, I&D of the scrotal abscess, was DC'd on 2/4.  Cultures grew MRSA.  He will return back to the hospital complaining of white area on his scrotum, draining from the wound. - Status post-debridement by urology on 2/7 -Initially placed on vancomycin and Zosyn, currently on IV doxycycline -High risk for readmission due to poor social support, patient reports difficult to do dressing changes at home, ran out of his supplies, has no transport or insurance, financial issues.  Will need a safe discharge plan given wound care needs, will likely benefit from skilled nursing facility if available for wound care.  -Appreciate urology recommendations, recommended continued good wound  care at least twice a day and preferably 3 times a day as possible, doxycycline for at least 1 more week.  Patient will be scheduled for follow-up next week to reassess the need for further antibiotics and debridement - SW assisting for possible nursing facility for good wound care and antibiotics with LOG  Poorly controlled diabetes mellitus -Hemoglobin A1c 8.1, doubt if patient is taking adequate care of his diabetes at home -Lantus increased to 13 units at bedtime, continue sliding scale insulin      Code Status: Full CODE STATUS DVT Prophylaxis:  Lovenox  Family Communication: Discussed in detail with the patient, all imaging results, lab results explained to the patient   Disposition Plan:  Time Spent in minutes 25 minutes  Procedures:  I&D  Consultants:   Urology  Antimicrobials:   IV doxycycline   Medications  Scheduled Meds: . enoxaparin (LOVENOX) injection  40 mg Subcutaneous Daily  . insulin aspart  0-9 Units Subcutaneous TID AC & HS  . insulin glargine  10 Units Subcutaneous QHS  . polyethylene glycol  17 g Oral Daily   Continuous Infusions: . doxycycline (VIBRAMYCIN) IV Stopped (09/03/17 1229)  PRN Meds:.oxyCODONE   Antibiotics   Anti-infectives (From admission, onward)   Start     Dose/Rate Route Frequency Ordered Stop   09/03/17 1000  doxycycline (VIBRAMYCIN) 100 mg in sodium chloride 0.9 % 250 mL IVPB     100 mg 125 mL/hr over 120 Minutes Intravenous Every 12 hours 09/03/17 0847     08/31/17 2200  doxycycline (VIBRAMYCIN) 100 mg in dextrose 5 % 250 mL IVPB  Status:  Discontinued     100 mg 125 mL/hr over 120 Minutes Intravenous Every 12 hours 08/31/17 1241 09/03/17 0847   08/31/17 1200  doxycycline (VIBRA-TABS) tablet 100 mg  Status:  Discontinued     100 mg Oral Every 12 hours 08/31/17 1043 08/31/17 1241   08/30/17 2200  vancomycin (VANCOCIN) 1,250 mg in sodium chloride 0.9 % 250 mL IVPB  Status:  Discontinued     1,250 mg 166.7 mL/hr over 90  Minutes Intravenous Every 12 hours 08/30/17 0337 08/31/17 1043   08/30/17 0800  piperacillin-tazobactam (ZOSYN) IVPB 3.375 g  Status:  Discontinued     3.375 g 12.5 mL/hr over 240 Minutes Intravenous Every 8 hours 08/30/17 0337 08/31/17 1043   08/30/17 0315  vancomycin (VANCOCIN) 2,000 mg in sodium chloride 0.9 % 500 mL IVPB     2,000 mg 250 mL/hr over 120 Minutes Intravenous  Once 08/30/17 0253 08/30/17 0511   08/30/17 0100  vancomycin (VANCOCIN) IVPB 1000 mg/200 mL premix  Status:  Discontinued     1,000 mg 200 mL/hr over 60 Minutes Intravenous  Once 08/30/17 0021 08/30/17 0357   08/30/17 0030  piperacillin-tazobactam (ZOSYN) IVPB 3.375 g     3.375 g 100 mL/hr over 30 Minutes Intravenous  Once 08/30/17 0021 08/30/17 0326        Subjective:   Spencer Robles was seen and examined today.  Dressing done today, feels overwhelmed.  Otherwise no complaints.  No fevers. Patient denies dizziness, chest pain, shortness of breath, abdominal pain, N/V/D/C, new weakness, numbess, tingling. No acute events overnight.    Objective:   Vitals:   09/02/17 0500 09/02/17 1403 09/02/17 2100 09/03/17 0504  BP: (!) 145/75 140/74 (!) 143/75 140/82  Pulse: (!) 59 66 66 61  Resp: 16 18 16 16   Temp: 99.1 F (37.3 C) 99.1 F (37.3 C) 98.6 F (37 C) 99 F (37.2 C)  TempSrc: Oral Oral Oral Oral  SpO2: 98% 98% 98% 95%  Weight:      Height:        Intake/Output Summary (Last 24 hours) at 09/03/2017 1253 Last data filed at 09/03/2017 0831 Gross per 24 hour  Intake 240 ml  Output 300 ml  Net -60 ml     Wt Readings from Last 3 Encounters:  08/30/17 103.8 kg (228 lb 13.4 oz)     Exam   General: Alert and oriented x 3, NAD  Eyes:   HEENT:    Cardiovascular: S1 S2 auscultated, Regular rate and rhythm. No pedal edema b/l  Respiratory: Clear to auscultation bilaterally, no wheezing, rales or rhonchi  Gastrointestinal: Soft, nontender, nondistended, + bowel sounds  Ext: no pedal edema  bilaterally  Neuro: no neuro deficit  Musculoskeletal: No digital cyanosis, clubbing  Skin/GU: Dressing intact, wound examined on 2/10  Psych: Normal affect and demeanor, alert and oriented x3    Data Reviewed:  I have personally reviewed following labs and imaging studies  Micro Results Recent Results (from the past 240 hour(s))  Urine culture     Status: None  Collection Time: 08/30/17 12:53 AM  Result Value Ref Range Status   Specimen Description   Final    URINE, CLEAN CATCH Performed at [facility], [place] [address], [place], [place] [address]    Special Requests   Final    NONE Performed at [facility], [place] [address] [place], [place] [address]    Culture   Final    NO GROWTH Performed at [facility], [place] [address], [place], [place] [address]    Report Status 08/31/2017 FINAL  Final    Radiology Reports US Scrotum W/doppler  Result Date: 08/29/2017 CLINICAL DATA:  Left testicular swelling for several days EXAM: SCROTAL ULTRASOUND DOPPLER ULTRASOUND OF THE TESTICLES TECHNIQUE: Complete ultrasound examination of the testicles, epididymis, and other scrotal structures was performed. Color and spectral Doppler ultrasound were also utilized to evaluate blood flow to the testicles. COMPARISON:  08/15/2017 FINDINGS: Right testicle Measurements: 4.0 x 1.8 x 3.7 cm. Previously seen heterogeneity of the right testicle is not as well appreciated on today's exam. Improved vascularity is noted. No focal mass is seen. Left testicle Measurements: 4.0 x 1.8 x 3.9 cm. No mass or microlithiasis visualized. Right epididymis:  Normal in size and appearance. Left epididymis:  Normal in size and appearance. Hydrocele: Right-sided hydrocele is noted increased from the prior exam. Varicocele:  None visualized. Pulsed Doppler interrogation of both testes demonstrates normal low resistance arterial and venous waveforms bilaterally. Scrotal wall swelling  is noted consistent with the known history of prior scrotal abscess and drainage. IMPRESSION: Resolution of previously seen heterogeneity within the right testicle. Normal-appearing left testicle. Scrotal wall thickening consistent with the known inflammatory changes and recent scrotal abscess drainage. Right-sided hydrocele.  This is increased from the prior exam. Electronically Signed   By: Inez Catalina M.D.   On: 08/29/2017 15:26    Lab Data:  CBC: Recent Labs  Lab 08/29/17 1431 08/31/17 0505 09/02/17 0520 09/03/17 0517  WBC 12.6* 14.7* 9.2 8.9  NEUTROABS 8.9*  --   --   --   HGB 11.8* 10.2* 10.5* 10.9*  HCT 36.5* 31.3* 31.9* 33.0*  MCV 85.3 84.1 84.2 84.8  PLT 572* 484* 369 973   Basic Metabolic Panel: Recent Labs  Lab 08/29/17 1431 08/31/17 0505 09/02/17 0520 09/03/17 0517  NA 137 131* 131* 130*  K 4.4 4.0 3.9 4.0  CL 100* 96* 98* 96*  CO2 31 28 25 25   GLUCOSE 96 184* 166* 187*  BUN 12 12 9 12   CREATININE 0.80 0.86 0.74 0.74  CALCIUM 9.1 8.6* 8.5* 8.8*   GFR: Estimated Creatinine Clearance: 124.4 mL/min (by C-G formula based on SCr of 0.74 mg/dL). Liver Function Tests: No results for input(s): AST, ALT, ALKPHOS, BILITOT, PROT, ALBUMIN in the last 168 hours. No results for input(s): LIPASE, AMYLASE in the last 168 hours. No results for input(s): AMMONIA in the last 168 hours. Coagulation Profile: No results for input(s): INR, PROTIME in the last 168 hours. Cardiac Enzymes: No results for input(s): CKTOTAL, CKMB, CKMBINDEX, TROPONINI in the last 168 hours. BNP (last 3 results) No results for input(s): PROBNP in the last 8760 hours. HbA1C: Recent Labs    09/03/17 0517  HGBA1C 8.1*   CBG: Recent Labs  Lab 09/02/17 0745 09/02/17 1219 09/02/17 1625 09/02/17 2151 09/03/17 0802  GLUCAP 138* 175* 171* 218* 166*   Lipid Profile: No results for input(s): CHOL, HDL, LDLCALC, TRIG, CHOLHDL, LDLDIRECT in the last 72 hours. Thyroid Function Tests: No  results  for input(s): TSH, T4TOTAL, FREET4, T3FREE, THYROIDAB in the last 72 hours. Anemia Panel: No results for input(s): VITAMINB12, FOLATE, FERRITIN, TIBC, IRON, RETICCTPCT in the last 72 hours. Urine analysis:    Component Value Date/Time   COLORURINE YELLOW 08/30/2017 0053   APPEARANCEUR CLEAR 08/30/2017 0053   LABSPEC 1.019 08/30/2017 0053   PHURINE 5.0 08/30/2017 0053   GLUCOSEU NEGATIVE 08/30/2017 0053   HGBUR SMALL (A) 08/30/2017 0053   BILIRUBINUR NEGATIVE 08/30/2017 0053   KETONESUR NEGATIVE 08/30/2017 0053   PROTEINUR NEGATIVE 08/30/2017 0053   NITRITE NEGATIVE 08/30/2017 0053   LEUKOCYTESUR TRACE (A) 08/30/2017 0053     Kassity Woodson M.D. [facility] 09/03/2017, 12:53 PM  Pager: [phone] Between 7am to 7pm - call Pager - [phone]  After 7pm go to [email or web address] - password [email or web address]  Call night coverage person covering after 7pm

## 2017-09-03 NOTE — Progress Notes (Signed)
LCSW following for SNF placement.   Patient has concerns with going out of town for SNF due to lack of support.   Patient request that LCSW speak with his cousin Thayer Headings, [place] regarding facilities. LCSW attempted reach cousin. Phone went to voicemail and voicemail is full.   LCSW awaiting response from facilities to determine placement. No bed offers at this time.   LCSW will continue to follow.  Carolin Coy [place]

## 2017-09-03 NOTE — Progress Notes (Signed)
Patient ID: Spencer Robles, male   DOB: [birth year], 59 y.o.   MRN: [id number]  4 Days Post-Op Subjective: Pt has continued to receive dressing changes over the weekend.  Dr. Simone Curia notes reviewed.  No fever over the weekend.  Remains on doxycycline.  Discussions are ongoing about finding a solution for patient to receive adequate wound care with agreement by providers that patient will be at high risk for repeat hospitalizations if he is discharged home to provide own wound care.  Objective: Vital signs in last 24 hours: Temp:  [98.6 F (37 C)-99.1 F (37.3 C)] 99 F (37.2 C) (02/11 0504) Pulse Rate:  [61-66] 61 (02/11 0504) Resp:  [16-18] 16 (02/11 0504) BP: (140-143)/(74-82) 140/82 (02/11 0504) SpO2:  [95 %-98 %] 95 % (02/11 0504)  Intake/Output from previous day: 02/10 0701 - 02/11 0700 In: -  Out: 300 [Urine:300] Intake/Output this shift: No intake/output data recorded.  Physical Exam:  General: Alert and oriented GU: Anterior scrotal wound beginning to granulate with less fibrinous material noted.  There is no significant necrotic tissue to suggest that further operative debridement would be helpful currently. Posterior wound continues to granulate well.  Lab Results: Recent Labs    09/02/17 0520 09/03/17 0517  HGB 10.5* 10.9*  HCT 31.9* 33.0*   CBC Latest Ref Rng & Units 09/03/2017 09/02/2017 08/31/2017  WBC 4.0 - 10.5 K/uL 8.9 9.2 14.7(H)  Hemoglobin 13.0 - 17.0 g/dL 10.9(L) 10.5(L) 10.2(L)  Hematocrit 39.0 - 52.0 % 33.0(L) 31.9(L) 31.3(L)  Platelets 150 - 400 K/uL 330 369 484(H)     BMET Recent Labs    09/02/17 0520 09/03/17 0517  NA 131* 130*  K 3.9 4.0  CL 98* 96*  CO2 25 25  GLUCOSE 166* 187*  BUN 9 12  CREATININE 0.74 0.74  CALCIUM 8.5* 8.8*     Studies/Results: No results found.  Assessment/Plan: MRSA scrotal abscess  - No indication for further operative debridement or intervention currently.  Patient will need continued good wound care with  wet to dry saline dressings at least twice daily (and preferably three times daily if possible).  Continue oral doxycycline upon discharge for at least one more week.  Pt was scheduled to be seen in our office on Wednesday and this appointment will be rescheduled for next week to reasess wound and determine need for further antibiotics/operative debridement.   LOS: 4 days   Ante Arredondo,LES 09/03/2017, 7:30 AM

## 2017-09-04 DIAGNOSIS — L0291 Cutaneous abscess, unspecified: Secondary | ICD-10-CM

## 2017-09-04 DIAGNOSIS — N492 Inflammatory disorders of scrotum: Secondary | ICD-10-CM

## 2017-09-04 DIAGNOSIS — L988 Other specified disorders of the skin and subcutaneous tissue: Secondary | ICD-10-CM

## 2017-09-04 LAB — CBC
HCT: 32.2 % — ABNORMAL LOW (ref 39.0–52.0)
Hemoglobin: 10.8 g/dL — ABNORMAL LOW (ref 13.0–17.0)
MCH: 28.2 pg (ref 26.0–34.0)
MCHC: 33.5 g/dL (ref 30.0–36.0)
MCV: 84.1 fL (ref 78.0–100.0)
PLATELETS: 313 10*3/uL (ref 150–400)
RBC: 3.83 MIL/uL — ABNORMAL LOW (ref 4.22–5.81)
RDW: 14.1 % (ref 11.5–15.5)
WBC: 8.1 10*3/uL (ref 4.0–10.5)

## 2017-09-04 LAB — BASIC METABOLIC PANEL
ANION GAP: 10 (ref 5–15)
BUN: 14 mg/dL (ref 6–20)
CALCIUM: 9.1 mg/dL (ref 8.9–10.3)
CO2: 26 mmol/L (ref 22–32)
CREATININE: 0.71 mg/dL (ref 0.61–1.24)
Chloride: 98 mmol/L — ABNORMAL LOW (ref 101–111)
Glucose, Bld: 183 mg/dL — ABNORMAL HIGH (ref 65–99)
Potassium: 4.2 mmol/L (ref 3.5–5.1)
Sodium: 134 mmol/L — ABNORMAL LOW (ref 135–145)

## 2017-09-04 LAB — GLUCOSE, CAPILLARY
GLUCOSE-CAPILLARY: 180 mg/dL — AB (ref 65–99)
Glucose-Capillary: 200 mg/dL — ABNORMAL HIGH (ref 65–99)

## 2017-09-04 MED ORDER — DOXYCYCLINE HYCLATE 100 MG PO TABS: 100.0000 mg | ORAL_TABLET | Freq: Two times a day (BID) | ORAL | Status: DC

## 2017-09-04 MED ORDER — INSULIN GLARGINE 100 UNIT/ML ~~LOC~~ SOLN: 12.0000 [IU] | Freq: Every day | SUBCUTANEOUS | 11 refills | Status: DC

## 2017-09-04 MED ORDER — OXYCODONE HCL 5 MG PO TABS: 5.0000 mg | ORAL_TABLET | Freq: Four times a day (QID) | ORAL | 0 refills | Status: DC | PRN

## 2017-09-04 NOTE — Evaluation (Signed)
Physical Therapy Evaluation Patient Details Name: Spencer Robles MRN: [id number] DOB: [birth year] Today's Date: 09/04/2017   History of Present Illness  57 year old male with poorly controlled diabetes who came in with recurrent scrotal infection.He was admitted to the hospital at the end of January with scrotal cellulitis, he failed to improve on IV antibiotics and was eventually taken to the operating room by urology for I&D, he is growing MRSA.  He was discharged home on doxycycline based on sensitivities, however patient had difficulties in doing dressing changes, ran out of supplies, is once started having more discharge and decided to come back.  He was taken again to the operating room on 2/7 for further debridement.  Clinical Impression  Pt is modified independent with mobility, he ambulated 320' with RW with no loss of balance. Encouraged pt to ambulate in halls with RW at least TID to minimize deconditioning during hospitalization. No further PT indicated as pt is modified independent with mobility, will sign off.        Follow Up Recommendations SNF(SNF recommended for wound care and diabetes management)    Equipment Recommendations  None recommended by PT    Recommendations for Other Services       Precautions / Restrictions Precautions Precautions: Other (comment) Precaution Comments: open scrotal incision Restrictions Weight Bearing Restrictions: No      Mobility  Bed Mobility Overal bed mobility: Modified Independent             General bed mobility comments: with rail  Transfers Overall transfer level: Independent                  Ambulation/Gait Ambulation/Gait assistance: Modified independent (Device/Increase time) Ambulation Distance (Feet): 320 Feet Assistive device: Rolling walker (2 wheeled) Gait Pattern/deviations: WFL(Within Functional Limits)   Gait velocity interpretation: at or above normal speed for age/gender General Gait Details:  VCs for forward head posture, steady, no loss of balance  Stairs            Wheelchair Mobility    Modified Rankin (Stroke Patients Only)       Balance Overall balance assessment: Modified Independent                                           Pertinent Vitals/Pain Pain Assessment: 0-10 Pain Score: 4  Pain Location: scrotal wound Pain Descriptors / Indicators: Sore Pain Intervention(s): Limited activity within patient's tolerance;Monitored during session    Home Living Family/patient expects to be discharged to:: Private residence Living Arrangements: Alone     Home Access: Level entry     Home Layout: One level Home Equipment: Environmental consultant - 2 wheels      Prior Function Level of Independence: Independent with assistive device(s)         Comments: was walking with RW at home, independent sponge bathing     Hand Dominance        Extremity/Trunk Assessment   Upper Extremity Assessment Upper Extremity Assessment: Overall WFL for tasks assessed    Lower Extremity Assessment Lower Extremity Assessment: Overall WFL for tasks assessed(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, sensation intact to light touch B feet, B knee ext +4/5)    Cervical / Trunk Assessment Cervical / Trunk Assessment: Kyphotic(forward head)  Communication   Communication: No difficulties  Cognition Arousal/Alertness: Awake/alert Behavior During Therapy: WFL for tasks assessed/performed Overall Cognitive Status: Within Functional Limits for  tasks assessed                                        General Comments      Exercises     Assessment/Plan    PT Assessment    PT Problem List         PT Treatment Interventions      PT Goals (Current goals can be found in the Care Plan section)  Acute Rehab PT Goals Patient Stated Goal: likes to do oil painting PT Goal Formulation: All assessment and education complete, DC therapy    Frequency      Barriers to discharge        Co-evaluation               AM-PAC PT "6 Clicks" Daily Activity  Outcome Measure Difficulty turning over in bed (including adjusting bedclothes, sheets and blankets)?: None Difficulty moving from lying on back to sitting on the side of the bed? : None Difficulty sitting down on and standing up from a chair with arms (e.g., wheelchair, bedside commode, etc,.)?: None Help needed moving to and from a bed to chair (including a wheelchair)?: None Help needed walking in hospital room?: None Help needed climbing 3-5 steps with a railing? : None 6 Click Score: 24    End of Session Equipment Utilized During Treatment: Gait belt Activity Tolerance: Patient tolerated treatment well Patient left: in chair;with call bell/phone within reach;with family/visitor present Nurse Communication: Mobility status      Time: 5277-8242 PT Time Calculation (min) (ACUTE ONLY): 29 min   Charges:   PT Evaluation $PT Eval Low Complexity: 1 Low PT Treatments $Gait Training: 8-22 mins   PT G Codes:          Philomena Doheny 09/04/2017, 11:47 AM [phone]

## 2017-09-04 NOTE — Progress Notes (Signed)
Patient ID: Spencer Robles, male   DOB: 02/14/1961, 57 y.o.   MRN: [id number]  5 Days Post-Op Subjective: No new complaints.  Objective: Vital signs in last 24 hours: Temp:  [98.9 F (37.2 C)-100 F (37.8 C)] 98.9 F (37.2 C) (02/12 0510) Pulse Rate:  [66-72] 66 (02/12 0510) Resp:  [18-20] 18 (02/12 0510) BP: (141-149)/(74-87) 141/87 (02/12 0510) SpO2:  [97 %-98 %] 98 % (02/12 0510)  Intake/Output from previous day: 02/11 0701 - 02/12 0700 In: 1100 [P.O.:600; IV Piggyback:500] Out: 300 [Urine:300] Intake/Output this shift: No intake/output data recorded.  Physical Exam:  General: Alert and oriented GU: Wound continues to granulate.  Less fibrinous tissue now.  Minimal necrotic tissue at right superior edge that is not progressive and does not require further debridement.  Lab Results: Recent Labs    09/02/17 0520 09/03/17 0517 09/04/17 0516  HGB 10.5* 10.9* 10.8*  HCT 31.9* 33.0* 32.2*   CBC Latest Ref Rng & Units 09/04/2017 09/03/2017 09/02/2017  WBC 4.0 - 10.5 K/uL 8.1 8.9 9.2  Hemoglobin 13.0 - 17.0 g/dL 10.8(L) 10.9(L) 10.5(L)  Hematocrit 39.0 - 52.0 % 32.2(L) 33.0(L) 31.9(L)  Platelets 150 - 400 K/uL 313 330 369     BMET Recent Labs    09/03/17 0517 09/04/17 0516  NA 130* 134*  K 4.0 4.2  CL 96* 98*  CO2 25 26  GLUCOSE 187* 183*  BUN 12 14  CREATININE 0.74 0.71  CALCIUM 8.8* 9.1     Studies/Results: No results found.  Assessment/Plan: MRSA scrotal abscess - Continue doxycycline and aggressive wound care.  Pt awaiting SNF placement.  I have asked our office to reschedule appointment in our office from tomorrow until sometime next week.   LOS: 5 days   Esther Bradstreet,LES 09/04/2017, 7:56 AM

## 2017-09-04 NOTE — Discharge Summary (Signed)
Physician Discharge Summary  Spencer Robles LYY:[id number] DOB: 1961-01-27 DOA: 08/29/2017  PCP: Patient, No Pcp Per  Admit date: 08/29/2017 Discharge date: 09/04/2017  Admitted From: home Disposition:  SNF -[facility] in [place]  Recommendations for Outpatient Follow-up:  1. Follow up with Dr. Alinda Money in 1 week 2. Continue Doxycycline for 1 weeks, course may be longer pending Urology evaluation next week in office  Home Health: none Equipment/Devices: none   Discharge Condition: stable CODE STATUS: Full code Diet recommendation: diabetic   HPI: Spencer Robles is a 57 y.o. male with medical history significant of poorly controlled diabetes mellitus, who presents to the hospital with purulent drainage from his scrotum.  He was admitted to the hospital at the end of January with scrotal cellulitis, he failed to improve on IV antibiotics and was eventually taken to the operating room by urology for I&D of a scrotal abscess.  He was discharged home just a few days ago, states that he continued to have pain/discomfort and continued drainage and decided to come back to the emergency room.  He denies any fever or chills, reports compliance with his doxycycline.  He reports pain which is about 4/10 in the area, bearable.  He has no chest pain or shortness of breath.  He denies any lightheadedness or dizziness.  There is also reports that patient ran out of supplies at home, and also per most recent progress note during his previous hospitalization that he was somewhat unengaged in RN teaching about dressing changes.  On cultures he grew MRSA which was sensitive to tetracyclines and patient was discharged home on doxycycline. ED Course: In the emergency room his vital signs are stable, he was afebrile and normotensive.  Satting well on room air.  His blood work is remarkable for marked leukocytosis with a white count of 12.6, CBGs appear well-controlled.  Urology was consulted, plan to take patient to  the operating room for reexploration, and we were asked to admit    Hospital Course: MRSA positive scrotal abscess/cellulitis -Status post recent hospitalization at the end of January with scrotal cellulitis, I&D of the scrotal abscess, was DC'd on 2/4.  Cultures grew MRSA.  He will return back to the hospital complaining of white area on his scrotum, draining from the wound. -Status post-debridement by urology on 2/7 -Initially placed on vancomycin and Zosyn, currently on doxycycline -Appreciate urology recommendations, recommended continued good wound care at least twice a day and preferably 3 times a day as possible, doxycycline for at least 1 more week.  Patient will be scheduled for follow-up next week to reassess the need for further antibiotics and debridement Poorly controlled diabetes mellitus -Hemoglobin A1c 8.1, doubt if patient is taking adequate care of his diabetes at home -continue Lantus as below, SSI per SNF    Discharge Diagnoses:  Active Problems:   Scrotal abscess   Abscess     Discharge Instructions   Allergies as of 09/04/2017   No Known Allergies     Medication List    TAKE these medications   acetaminophen 500 MG tablet Commonly known as:  TYLENOL Take 1,000 mg by mouth every 6 (six) hours as needed for mild pain.   docusate sodium 100 MG capsule Commonly known as:  COLACE Take 100 mg by mouth 2 (two) times daily.   doxycycline 100 MG tablet Commonly known as:  VIBRA-TABS Take 1 tablet (100 mg total) by mouth 2 (two) times daily.   ENSURE Take 237 mLs by mouth 2 (  two) times daily.   feeding supplement (PRO-STAT SUGAR FREE 64) Liqd Take 30 mLs by mouth 3 (three) times daily with meals.   insulin aspart 100 UNIT/ML injection Commonly known as:  novoLOG Inject 6 Units into the skin 2 (two) times daily.   insulin glargine 100 UNIT/ML injection Commonly known as:  LANTUS Inject 0.12 mLs (12 Units total) into the skin at bedtime. What  changed:  how much to take   metFORMIN 500 MG tablet Commonly known as:  GLUCOPHAGE Take 500 mg by mouth 2 (two) times daily with a meal.   oxyCODONE 5 MG immediate release tablet Commonly known as:  Oxy IR/ROXICODONE Take 1 tablet (5 mg total) by mouth every 6 (six) hours as needed for severe pain.   polyethylene glycol packet Commonly known as:  MIRALAX / GLYCOLAX Take 17 g by mouth daily as needed for mild constipation.        Consultations:  Urology   Procedures/Studies: I&D 2/7  US Scrotum W/doppler  Result Date: 08/29/2017 CLINICAL DATA:  Left testicular swelling for several days EXAM: SCROTAL ULTRASOUND DOPPLER ULTRASOUND OF THE TESTICLES TECHNIQUE: Complete ultrasound examination of the testicles, epididymis, and other scrotal structures was performed. Color and spectral Doppler ultrasound were also utilized to evaluate blood flow to the testicles. COMPARISON:  08/15/2017 FINDINGS: Right testicle Measurements: 4.0 x 1.8 x 3.7 cm. Previously seen heterogeneity of the right testicle is not as well appreciated on today's exam. Improved vascularity is noted. No focal mass is seen. Left testicle Measurements: 4.0 x 1.8 x 3.9 cm. No mass or microlithiasis visualized. Right epididymis:  Normal in size and appearance. Left epididymis:  Normal in size and appearance. Hydrocele: Right-sided hydrocele is noted increased from the prior exam. Varicocele:  None visualized. Pulsed Doppler interrogation of both testes demonstrates normal low resistance arterial and venous waveforms bilaterally. Scrotal wall swelling is noted consistent with the known history of prior scrotal abscess and drainage. IMPRESSION: Resolution of previously seen heterogeneity within the right testicle. Normal-appearing left testicle. Scrotal wall thickening consistent with the known inflammatory changes and recent scrotal abscess drainage. Right-sided hydrocele.  This is increased from the prior exam. Electronically Signed    By: Inez Catalina M.D.   On: 08/29/2017 15:26      Subjective: - no chest pain, shortness of breath, no abdominal pain, nausea or vomiting.   Discharge Exam: Vitals:   09/04/17 0510 09/04/17 1424  BP: (!) 141/87 136/68  Pulse: 66 71  Resp: 18   Temp: 98.9 F (37.2 C) 98.5 F (36.9 C)  SpO2: 98% 99%    General: Pt is alert, awake, not in acute distress Cardiovascular: RRR, S1/S2 +, no rubs, no gallops Respiratory: CTA bilaterally, no wheezing, no rhonchi Abdominal: Soft, NT, ND, bowel sounds + Extremities: no edema, no cyanosis    The results of significant diagnostics from this hospitalization (including imaging, microbiology, ancillary and laboratory) are listed below for reference.     Microbiology: Recent Results (from the past 240 hour(s))  Urine culture     Status: None   Collection Time: 08/30/17 12:53 AM  Result Value Ref Range Status   Specimen Description   Final    URINE, CLEAN CATCH Performed at [facility], [place] [address] [place], [place] [address]    Special Requests   Final    NONE Performed at [facility], [place] [address], [place], [place] [address]    Culture   Final    NO  GROWTH Performed at [facility], [place] [address] [place], [place] [address]    Report Status 08/31/2017 FINAL  Final     Labs: BNP (last 3 results) No results for input(s): BNP in the last 8760 hours. Basic Metabolic Panel: Recent Labs  Lab 08/29/17 1431 08/31/17 0505 09/02/17 0520 09/03/17 0517 09/04/17 0516  NA 137 131* 131* 130* 134*  K 4.4 4.0 3.9 4.0 4.2  CL 100* 96* 98* 96* 98*  CO2 31 28 25 25 26   GLUCOSE 96 184* 166* 187* 183*  BUN 12 12 9 12 14   CREATININE 0.80 0.86 0.74 0.74 0.71  CALCIUM 9.1 8.6* 8.5* 8.8* 9.1   Liver Function Tests: No results for input(s): AST, ALT, ALKPHOS, BILITOT, PROT, ALBUMIN in the last 168 hours. No results for input(s): LIPASE, AMYLASE in the last 168 hours. No results for  input(s): AMMONIA in the last 168 hours. CBC: Recent Labs  Lab 08/29/17 1431 08/31/17 0505 09/02/17 0520 09/03/17 0517 09/04/17 0516  WBC 12.6* 14.7* 9.2 8.9 8.1  NEUTROABS 8.9*  --   --   --   --   HGB 11.8* 10.2* 10.5* 10.9* 10.8*  HCT 36.5* 31.3* 31.9* 33.0* 32.2*  MCV 85.3 84.1 84.2 84.8 84.1  PLT 572* 484* 369 330 313   Cardiac Enzymes: No results for input(s): CKTOTAL, CKMB, CKMBINDEX, TROPONINI in the last 168 hours. BNP: Invalid input(s): POCBNP CBG: Recent Labs  Lab 09/03/17 1215 09/03/17 1724 09/03/17 2039 09/04/17 0728 09/04/17 1152  GLUCAP 142* 170* 166* 200* 180*   D-Dimer No results for input(s): DDIMER in the last 72 hours. Hgb A1c Recent Labs    09/03/17 0517  HGBA1C 8.1*   Lipid Profile No results for input(s): CHOL, HDL, LDLCALC, TRIG, CHOLHDL, LDLDIRECT in the last 72 hours. Thyroid function studies No results for input(s): TSH, T4TOTAL, T3FREE, THYROIDAB in the last 72 hours.  Invalid input(s): FREET3 Anemia work up No results for input(s): VITAMINB12, FOLATE, FERRITIN, TIBC, IRON, RETICCTPCT in the last 72 hours. Urinalysis    Component Value Date/Time   COLORURINE YELLOW 08/30/2017 0053   APPEARANCEUR CLEAR 08/30/2017 0053   LABSPEC 1.019 08/30/2017 0053   PHURINE 5.0 08/30/2017 0053   GLUCOSEU NEGATIVE 08/30/2017 0053   HGBUR SMALL (A) 08/30/2017 0053   BILIRUBINUR NEGATIVE 08/30/2017 0053   KETONESUR NEGATIVE 08/30/2017 0053   PROTEINUR NEGATIVE 08/30/2017 0053   NITRITE NEGATIVE 08/30/2017 0053   LEUKOCYTESUR TRACE (A) 08/30/2017 0053   Sepsis Labs Invalid input(s): PROCALCITONIN,  WBC,  LACTICIDVEN   Time coordinating discharge: 35 minutes  SIGNED:  Marzetta Board, MD  [facility] 09/04/2017, 2:52 PM Pager [phone]  If 7PM-7AM, please contact night-coverage [email or web address] Password [email or web address]

## 2017-09-04 NOTE — Clinical Social Work Placement (Signed)
    2:59 PM  Patient chose bed at [facility].  Room 414B  Patient will transport by [facility].   LCSW faxed dc docs to facility via hub.   LCSW notified family of transfer.   RN report number: [phone]  BK[facility]  CLINICAL SOCIAL WORK PLACEMENT  NOTE  Date:  09/04/2017  Patient Details  Name: Spencer Robles MRN: [id number] Date of Birth: 1961/06/29  Clinical Social Work is seeking post-discharge placement for this patient at the [place] level of care (*CSW will initial, date and re-position this form in  chart as items are completed):  Yes   Patient/family provided with [place] Work Department's list of facilities offering this level of care within the geographic area requested by the patient (or if unable, by the patient's family).  Yes   Patient/family informed of their freedom to choose among providers that offer the needed level of care, that participate in Medicare, Medicaid or managed care program needed by the patient, have an available bed and are willing to accept the patient.  Yes   Patient/family informed of [facility]'s ownership interest in [facility] and [facility], as well as of the fact that they are under no obligation to receive care at these facilities.  PASRR submitted to EDS on       PASRR number received on 08/31/17     Existing PASRR number confirmed on       FL2 transmitted to all facilities in geographic area requested by pt/family on 08/31/17     FL2 transmitted to all facilities within larger geographic area on 08/31/17     Patient informed that his/her managed care company has contracts with or will negotiate with certain facilities, including the following:        Yes   Patient/family informed of bed offers received.  Patient chooses bed at [facility]     Physician recommends and patient chooses bed at [facility]    Patient to be transferred to [facility] on 09/04/17.  Patient to be transferred to facility by EMS     Patient family notified on 09/04/17 of transfer.  Name of family member notified:  Cousin     PHYSICIAN       Additional Comment:    _______________________________________________ Servando Snare, LCSW 09/04/2017, 2:59 PM

## 2017-09-04 NOTE — Progress Notes (Signed)
LCSW following for SNF placement.  Patient needs LOG bed for wound care.   [facility] coming to assess the patient face to face.   LCSW will continue to follow.   Carolin Coy [place]

## 2017-09-04 NOTE — Progress Notes (Signed)
While in patient's room this morning, he was in the bathroom messing with his wound. The nurses and staff continue to tell the patient that he should not continue to mess with and touch his wound site, that it could cause infection to become worse. Patient still continues to mess with wound area, needs more guidance on care of wound.

## 2017-09-04 NOTE — Care Management Note (Signed)
Case Management Note  Patient Details  Name: Spencer Robles MRN: [id number] Date of Birth: 1961-07-23  Subjective/Objective:                    Action/Plan:d/c SNF   Expected Discharge Date:                  Expected Discharge Plan:  [place]  In-House Referral:     Discharge planning Services  CM Consult, [facility]  Post Acute Care Choice:    Choice offered to:  NA(Indigent)  DME Arranged:    DME Agency:     HH Arranged:    HH Agency:     Status of Service:  Completed, signed off  If discussed at [place] of Stay Meetings, dates discussed:    Additional Comments:  Dessa Phi, RN 09/04/2017, 3:26 PM

## 2017-09-05 ENCOUNTER — Encounter (HOSPITAL_COMMUNITY): Payer: Self-pay | Admitting: Urology

## 2017-09-06 ENCOUNTER — Inpatient Hospital Stay (INDEPENDENT_AMBULATORY_CARE_PROVIDER_SITE_OTHER): Payer: Self-pay | Admitting: Physician Assistant

## 2017-09-25 ENCOUNTER — Telehealth (INDEPENDENT_AMBULATORY_CARE_PROVIDER_SITE_OTHER): Payer: Self-pay | Admitting: Physician Assistant

## 2017-09-25 NOTE — Telephone Encounter (Signed)
Pt called to ask if possible that the pcp can fill the FMLA papers today, he was informed that any paper it take 7 10 business day to be complete but we will ask and we will call when is done that way he can pick it up

## 2017-09-25 NOTE — Telephone Encounter (Signed)
Received FMLA paper via fax, fax is on t he pcp in-box

## 2017-10-02 NOTE — Telephone Encounter (Signed)
FMLA is filled out. Please fax out.

## 2017-10-02 NOTE — Telephone Encounter (Signed)
Noted  

## 2017-10-08 ENCOUNTER — Encounter (INDEPENDENT_AMBULATORY_CARE_PROVIDER_SITE_OTHER): Payer: Self-pay | Admitting: Physician Assistant

## 2017-10-08 ENCOUNTER — Other Ambulatory Visit: Payer: Self-pay

## 2017-10-08 ENCOUNTER — Ambulatory Visit (INDEPENDENT_AMBULATORY_CARE_PROVIDER_SITE_OTHER): Payer: Self-pay | Admitting: Physician Assistant

## 2017-10-08 VITALS — BP 130/86 | HR 88 | Temp 97.9°F | Wt 244.6 lb

## 2017-10-08 DIAGNOSIS — R1033 Periumbilical pain: Secondary | ICD-10-CM

## 2017-10-08 DIAGNOSIS — E1169 Type 2 diabetes mellitus with other specified complication: Secondary | ICD-10-CM

## 2017-10-08 DIAGNOSIS — Z79899 Other long term (current) drug therapy: Secondary | ICD-10-CM

## 2017-10-08 DIAGNOSIS — Z794 Long term (current) use of insulin: Secondary | ICD-10-CM

## 2017-10-08 DIAGNOSIS — R8281 Pyuria: Secondary | ICD-10-CM

## 2017-10-08 DIAGNOSIS — B351 Tinea unguium: Secondary | ICD-10-CM

## 2017-10-08 DIAGNOSIS — N492 Inflammatory disorders of scrotum: Secondary | ICD-10-CM

## 2017-10-08 DIAGNOSIS — N390 Urinary tract infection, site not specified: Secondary | ICD-10-CM

## 2017-10-08 DIAGNOSIS — R109 Unspecified abdominal pain: Secondary | ICD-10-CM

## 2017-10-08 LAB — POCT URINALYSIS DIPSTICK
BILIRUBIN UA: NEGATIVE
Blood, UA: NEGATIVE
GLUCOSE UA: NEGATIVE
Ketones, UA: NEGATIVE
Nitrite, UA: NEGATIVE
Protein, UA: 30
Spec Grav, UA: >=1.03 — AB (ref 1.010–1.025)
Urobilinogen, UA: 0.2 E.U./dL
pH, UA: 5.5 (ref 5.0–8.0)

## 2017-10-08 MED ORDER — TERBINAFINE HCL 250 MG PO TABS: 250.0000 mg | ORAL_TABLET | Freq: Every day | ORAL | 0 refills | Status: DC

## 2017-10-08 MED ORDER — CIPROFLOXACIN HCL 500 MG PO TABS: 500.0000 mg | ORAL_TABLET | Freq: Two times a day (BID) | ORAL | 0 refills | Status: DC

## 2017-10-08 MED FILL — TERBINAFINE HCL 250 MG TABS: 250 | 30 days supply | Qty: 30 | Fill #0

## 2017-10-08 MED FILL — CIPROFLOXACIN HCL 500 MG TA: 500 | 10 days supply | Qty: 20 | Fill #0

## 2017-10-08 MED FILL — GABAPENTIN 100 MG CAPSULE: 100 | 30 days supply | Qty: 90 | Fill #0

## 2017-10-08 MED FILL — metFORMIN HCL 500 MG TABS: 500 | 30 days supply | Qty: 60 | Fill #0

## 2017-10-08 NOTE — Progress Notes (Signed)
Subjective:  Patient ID: Spencer Robles, male    DOB: 05/23/1961  Age: 57 y.o. MRN: [id number]  CC: abdominal pain  HPI Gregary Blackard is a 57 y.o. male with a medical history of DM2 and OSA (does not use CPAP since 2011) presents with complaint of periumbilical discomfort/pain and an associated "lump" present when laying supine and when standing. Also has mild right flank pain. Onset 7-8 days ago. Does not lift heavy items. Stool is described as soft pellets. Does not endorse f/c/n/v, abdominal bloating, abdominal ecchymosis, CP, palpitations, SOB, HA, LE edema.      Outpatient Medications Prior to Visit  Medication Sig Dispense Refill  . insulin aspart (NOVOLOG) 100 UNIT/ML injection Inject 4 Units into the skin 3 (three) times daily with meals. (Patient taking differently: Inject 6 Units into the skin 2 (two) times daily. )    . insulin glargine (LANTUS) 100 UNIT/ML injection Inject 0.12 mLs (12 Units total) into the skin at bedtime. 10 mL 11  . metFORMIN (GLUCOPHAGE) 500 MG tablet Take 1 tablet (500 mg total) by mouth 2 (two) times daily with a meal. 60 tablet 5  . oxyCODONE (OXY IR/ROXICODONE) 5 MG immediate release tablet Take 1 tablet (5 mg total) by mouth every 6 (six) hours as needed for severe pain. 10 tablet 0  . polyethylene glycol (MIRALAX / GLYCOLAX) packet Take 17 g by mouth daily as needed for mild constipation.    Marland Kitchen acetaminophen (TYLENOL) 325 MG tablet Take 2 tablets (650 mg total) by mouth every 4 (four) hours as needed for mild pain, fever or headache. (Patient not taking: Reported on 10/08/2017)    . acetaminophen (TYLENOL) 500 MG tablet Take 1,000 mg by mouth every 6 (six) hours as needed for mild pain.    . Amino Acids-Protein Hydrolys (FEEDING SUPPLEMENT, PRO-STAT SUGAR FREE 64,) LIQD Take 30 mLs by mouth 3 (three) times daily with meals. (Patient not taking: Reported on 10/08/2017)    . Amino Acids-Protein Hydrolys (FEEDING SUPPLEMENT, PRO-STAT SUGAR FREE 64,) LIQD Take  30 mLs by mouth 3 (three) times daily with meals.    . docusate sodium (COLACE) 100 MG capsule Take 1 capsule (100 mg total) by mouth 2 (two) times daily. (Patient not taking: Reported on 10/08/2017)    . docusate sodium (COLACE) 100 MG capsule Take 100 mg by mouth 2 (two) times daily.    Marland Kitchen ENSURE (ENSURE) Take 237 mLs by mouth 2 (two) times daily.    . feeding supplement, ENSURE ENLIVE, (ENSURE ENLIVE) LIQD Take 237 mLs by mouth 2 (two) times daily between meals. (Patient not taking: Reported on 10/08/2017)    . insulin aspart (NOVOLOG) 100 UNIT/ML injection Inject 6 Units into the skin 2 (two) times daily.     . insulin glargine (LANTUS) 100 UNIT/ML injection Inject 0.3 mLs (30 Units total) into the skin at bedtime. (Patient not taking: Reported on 10/08/2017) 10 mL   . INSULIN SYRINGE 1CC/29G 29G X 1/2" 1 ML MISC 1 application by Does not apply route 4 (four) times daily. 120 each 11  . doxycycline (VIBRA-TABS) 100 MG tablet Take 1 tablet (100 mg total) by mouth 2 (two) times daily. (Patient not taking: Reported on 10/08/2017)    . metFORMIN (GLUCOPHAGE) 500 MG tablet Take 500 mg by mouth 2 (two) times daily with a meal.    . polyethylene glycol (MIRALAX / GLYCOLAX) packet Take 17 g by mouth daily as needed for mild constipation.     No facility-administered medications  prior to visit.      ROS Review of Systems  Constitutional: Negative for chills, fever and malaise/fatigue.  Eyes: Negative for blurred vision.  Respiratory: Negative for shortness of breath.   Cardiovascular: Negative for chest pain and palpitations.  Gastrointestinal: Positive for abdominal pain and constipation. Negative for blood in stool, diarrhea, melena, nausea and vomiting.  Genitourinary: Negative for dysuria and hematuria.  Musculoskeletal: Negative for joint pain and myalgias.  Skin: Negative for rash.  Neurological: Negative for tingling and headaches.  Psychiatric/Behavioral: Negative for depression. The patient  is not nervous/anxious.     Objective:  BP 130/86 (BP Location: Right Arm, Patient Position: Sitting, Cuff Size: Large)   Pulse 88   Temp 97.9 F (36.6 C) (Oral)   Wt 244 lb 9.6 oz (110.9 kg)   SpO2 97%   BMI 33.64 kg/m   BP/Weight 10/08/2017 1/44/3154 0/0/8676  Systolic BP 195 093 -  Diastolic BP 86 68 -  Wt. (Lbs) 244.6 - 228.84  BMI 33.64 - 31.47      Physical Exam  Constitutional: He is oriented to person, place, and time.  Well developed, well nourished, NAD, polite  HENT:  Head: Normocephalic and atraumatic.  Eyes: Conjunctivae are normal. No scleral icterus.  Neck: Normal range of motion. Neck supple. No thyromegaly present.  Cardiovascular: Normal rate, regular rhythm and normal heart sounds.  No murmur heard. Pulmonary/Chest: Effort normal and breath sounds normal. No respiratory distress. He has no wheezes. He has no rales.  Abdominal: Soft. Bowel sounds are normal. There is tenderness (Mild epigastric TTP and mild lower right flank tenderness).  Genitourinary:  Genitourinary Comments: No CVA tenderness bilaterally  Musculoskeletal: He exhibits no edema.  Lymphadenopathy:    He has no cervical adenopathy.  Neurological: He is alert and oriented to person, place, and time. No cranial nerve deficit. Coordination normal.  Skin: Skin is warm and dry. No rash noted. No erythema. No pallor.  Psychiatric: He has a normal mood and affect. His behavior is normal. Thought content normal.  Vitals reviewed.    Assessment & Plan:    1. Periumbilical abdominal pain - CBC with Differential - Comprehensive metabolic panel - Could likely be a small ventral hernia. Physical exam unremarkable.  2. Right flank pain - Urinalysis Dipstick with small leukocytes  3. Onychomycosis - Begin terbinafine (LAMISIL) 250 MG tablet; Take 1 tablet (250 mg total) by mouth daily.  Dispense: 90 tablet; Refill: 0  4. Scrotal abscess - Wound still healing. Packing in place with no  purulent discharge seen. Pt reportedly seen by wound care clinic.   5. High risk medication use - Comprehensive metabolic panel  6. Pyuria - Urine Culture - Begin ciprofloxacin (CIPRO) 500 MG tablet; Take 1 tablet (500 mg total) by mouth 2 (two) times daily.  Dispense: 20 tablet; Refill: 0   Meds ordered this encounter  Medications  . terbinafine (LAMISIL) 250 MG tablet    Sig: Take 1 tablet (250 mg total) by mouth daily.    Dispense:  90 tablet    Refill:  0    Order Specific Question:   Supervising Provider    Answer:   Tresa Garter [id number]  . ciprofloxacin (CIPRO) 500 MG tablet    Sig: Take 1 tablet (500 mg total) by mouth 2 (two) times daily.    Dispense:  20 tablet    Refill:  0    Order Specific Question:   Supervising Provider    Answer:   Doreene Burke,  [place] [[id number]]    Follow-up: Return in about 3 months (around 01/08/2018) for onychomycosis.   Clent Demark PA

## 2017-10-08 NOTE — Patient Instructions (Addendum)
Abdominal Pain, Adult Many things can cause belly (abdominal) pain. Most times, belly pain is not dangerous. Many cases of belly pain can be watched and treated at home. Sometimes belly pain is serious, though. Your doctor will try to find the cause of your belly pain. Follow these instructions at home:  Take over-the-counter and prescription medicines only as told by your doctor. Do not take medicines that help you poop (laxatives) unless told to by your doctor.  Drink enough fluid to keep your pee (urine) clear or pale yellow.  Watch your belly pain for any changes.  Keep all follow-up visits as told by your doctor. This is important. Contact a doctor if:  Your belly pain changes or gets worse.  You are not hungry, or you lose weight without trying.  You are having trouble pooping (constipated) or have watery poop (diarrhea) for more than 2-3 days.  You have pain when you pee or poop.  Your belly pain wakes you up at night.  Your pain gets worse with meals, after eating, or with certain foods.  You are throwing up and cannot keep anything down.  You have a fever. Get help right away if:  Your pain does not go away as soon as your doctor says it should.  You cannot stop throwing up.  Your pain is only in areas of your belly, such as the right side or the left lower part of the belly.  You have bloody or black poop, or poop that looks like tar.  You have very bad pain, cramping, or bloating in your belly.  You have signs of not having enough fluid or water in your body (dehydration), such as: ? Dark pee, very little pee, or no pee. ? Cracked lips. ? Dry mouth. ? Sunken eyes. ? Sleepiness. ? Weakness. This information is not intended to replace advice given to you by your health care provider. Make sure you discuss any questions you have with your health care provider. Document Released: 12/27/2007 Document Revised: 01/28/2016 Document Reviewed: 12/22/2015 Elsevier  Interactive Patient Education  2018 [place].    Wound Packing Wound packing involves placing a moistened packing material into your wound and then covering it with an outer bandage (dressing). This helps promote proper healing of deep tissue and tissue under the skin. It also helps prevent bleeding, infection, and further injury. Wounds are packed until deep tissue has had time to heal. The time it takes for this to occur is different for everyone. Your health care provider will show you how to pack and dress your wound. Using gloves and a sterile technique is important in order to avoid spreading germs into your wound. What are the risks?  Infection.  Delayed or abnormal healing. How to pack your wound Follow your health care provider's instructions on how often you need to change dressings and pack your wound. You will likely be asked to change dressings 1-2 times a day. Supplies Needed  Gloves.  Wetting solution.  Clean bowl.  Packing material (gauze or gauze sponges).  Clean towels.  Outer dressing.  Tape.  Cotton balls or cotton-tipped swabs.  Small plastic bag. Preparing Your New Packing Material  1. Make sure your work surface or countertop is clean and disinfected. 2. Wash your hands well with soap and water. 3. Put a clean towel on the counter. 4. Put a clean bowl on the towel. Be sure to only touch the outside of the bowl when handling it. 5. Pour wetting  solution into the bowl. 6. Cut your packing material (gauze or sponges) to the right size for your wound. Drop it into the bowl. 7. Cut four tape strips that you will use to seal the outer dressing. 8. Put cotton balls or cotton-tipped swabs on the clean towel. Removing the Old Packing and Dressing 1. Gently remove the old dressing and packing material. 2. Put the removed items into the plastic bag to throw away later. 3. Wash your hands well with soap and water again. Applying New Packing Material and  Dressing 1. Put on your gloves. 2. Squeeze the packing material in the bowl to release excess liquid. The packing material should be moist, but not dripping wet. 3. Gently place the packing material into the wound. Use a cotton ball or cotton-tipped swab to guide it into place, filling all of the space. 4. Dry your fingertips on the towel. 5. Open up your outer dressing supplies and put them on a dry part of the towel. Keep them from getting wet. 6. Place the dressing over the packed wound. 7. Tape the four outer edges of the dressing in place. 8. Remove your gloves. 9. Wash your hands again with soap and water. General Tips  Follow your health care provider's instructions on how tightly to pack the wound. At first, the wound will be packed tightly to help stop bleeding. As the wound begins to heal inside, you will use less packing material and pack the wound loosely to allow tissue to heal slowly from the inside out.  Keep the dressing clean and dry.  Follow any other instructions given by your health care provider on how to aid healing. This may include applying warm or cold compresses, elevating the affected area, or wearing a compression dressing.  Ask your health care provider about sun exposure and sunscreen when the dressings are no longer needed.  Keep all follow-up visits with your health care provider. This is important. Contact a health care provider if:  You have drainage, redness, swelling, or pain at your wound site.  You notice a bad smell coming from the wound site.  Your pain is not controlled with pain medicine.  Tissue inside your wound changes color from pink to white, yellow, or black.  Your wound changes in size or depth.  You have a fever.  You have shaking chills.  You are having trouble packing your wound. This information is not intended to replace advice given to you by your health care provider. Make sure you discuss any questions you have with your  health care provider. Document Released: 02/04/2014 Document Revised: 01/28/2016 Document Reviewed: 09/03/2013 Elsevier Interactive Patient Education  [employer].

## 2017-10-09 ENCOUNTER — Telehealth (INDEPENDENT_AMBULATORY_CARE_PROVIDER_SITE_OTHER): Payer: Self-pay

## 2017-10-09 LAB — COMPREHENSIVE METABOLIC PANEL
A/G RATIO: 1.4 (ref 1.2–2.2)
ALK PHOS: 101 IU/L (ref 39–117)
ALT: 11 IU/L (ref 0–44)
AST: 14 IU/L (ref 0–40)
Albumin: 4.6 g/dL (ref 3.5–5.5)
BILIRUBIN TOTAL: 0.3 mg/dL (ref 0.0–1.2)
BUN / CREAT RATIO: 13 (ref 9–20)
BUN: 11 mg/dL (ref 6–24)
CHLORIDE: 95 mmol/L — AB (ref 96–106)
CO2: 22 mmol/L (ref 20–29)
Calcium: 10 mg/dL (ref 8.7–10.2)
Creatinine, Ser: 0.88 mg/dL (ref 0.76–1.27)
GFR calc Af Amer: 109 mL/min/{1.73_m2} (ref >59)
GFR calc non Af Amer: 95 mL/min/{1.73_m2} (ref >59)
GLOBULIN, TOTAL: 3.4 g/dL (ref 1.5–4.5)
Glucose: 159 mg/dL — ABNORMAL HIGH (ref 65–99)
POTASSIUM: 4.1 mmol/L (ref 3.5–5.2)
SODIUM: 135 mmol/L (ref 134–144)
Total Protein: 8 g/dL (ref 6.0–8.5)

## 2017-10-09 LAB — CBC WITH DIFFERENTIAL/PLATELET
Basophils Absolute: 0 10*3/uL (ref 0.0–0.2)
Basos: 0 %
EOS (ABSOLUTE): 0.3 10*3/uL (ref 0.0–0.4)
EOS: 4 %
HEMATOCRIT: 41.1 % (ref 37.5–51.0)
Hemoglobin: 13.9 g/dL (ref 13.0–17.7)
Immature Grans (Abs): 0 10*3/uL (ref 0.0–0.1)
Immature Granulocytes: 0 %
LYMPHS ABS: 2.9 10*3/uL (ref 0.7–3.1)
Lymphs: 30 %
MCH: 27.9 pg (ref 26.6–33.0)
MCHC: 33.8 g/dL (ref 31.5–35.7)
MCV: 83 fL (ref 79–97)
MONOS ABS: 0.6 10*3/uL (ref 0.1–0.9)
Monocytes: 6 %
NEUTROS ABS: 5.8 10*3/uL (ref 1.4–7.0)
Neutrophils: 60 %
Platelets: 190 10*3/uL (ref 150–379)
RBC: 4.98 x10E6/uL (ref 4.14–5.80)
RDW: 15.2 % (ref 12.3–15.4)
WBC: 9.7 10*3/uL (ref 3.4–10.8)

## 2017-10-09 NOTE — Telephone Encounter (Signed)
Left message for patient informing that blood does not show any signs of overt infection, overall normal results. Nat Christen, CMA

## 2017-10-09 NOTE — Telephone Encounter (Signed)
-----   Message from Clent Demark, PA-C sent at 10/09/2017 11:00 AM EDT ----- Blood not showing signs of overt infection. Overall, normal results.

## 2017-10-10 LAB — URINE CULTURE: ORGANISM ID, BACTERIA: NO GROWTH

## 2017-10-11 ENCOUNTER — Ambulatory Visit: Payer: Self-pay | Admitting: Podiatry

## 2017-10-11 MED FILL — !LANTUS 100 UNITS/ML VIAL: 100 | 28 days supply | Qty: 10 | Fill #0

## 2017-10-11 MED FILL — !NOVOLOG 100UNITS/ML VIAL: 100/ML | 28 days supply | Qty: 10 | Fill #0

## 2017-11-12 ENCOUNTER — Other Ambulatory Visit (INDEPENDENT_AMBULATORY_CARE_PROVIDER_SITE_OTHER): Payer: Self-pay | Admitting: Physician Assistant

## 2017-11-12 ENCOUNTER — Ambulatory Visit (INDEPENDENT_AMBULATORY_CARE_PROVIDER_SITE_OTHER): Payer: Self-pay | Admitting: Physician Assistant

## 2017-11-12 DIAGNOSIS — Z760 Encounter for issue of repeat prescription: Secondary | ICD-10-CM

## 2017-11-12 MED ORDER — INSULIN ASPART 100 UNIT/ML ~~LOC~~ SOLN: 6.0000 [IU] | Freq: Two times a day (BID) | SUBCUTANEOUS | 11 refills | Status: DC

## 2017-11-12 MED ORDER — INSULIN GLARGINE 100 UNIT/ML ~~LOC~~ SOLN: 12.0000 [IU] | Freq: Every day | SUBCUTANEOUS | 11 refills | Status: DC

## 2017-11-12 MED ORDER — "INSULIN SYRINGE 29G X 1/2"" 1 ML MISC": 1.0000 "application " | Freq: Four times a day (QID) | 11 refills | Status: DC

## 2017-11-12 MED ORDER — METFORMIN HCL 500 MG PO TABS: 500.0000 mg | ORAL_TABLET | Freq: Two times a day (BID) | ORAL | 5 refills | Status: DC

## 2017-11-12 MED FILL — TERBINAFINE HCL 250 MG TABS: 250 | 30 days supply | Qty: 30 | Fill #1

## 2017-11-12 MED FILL — ?METFORMIN HCL 500MG TABLET: 500 | 30 days supply | Qty: 60 | Fill #0

## 2017-11-12 MED FILL — !NOVOLOG 100UNITS/ML VIAL: 100/ML | 28 days supply | Qty: 10 | Fill #1

## 2017-11-12 MED FILL — !LANTUS 100 UNITS/ML VIAL: 100 | 28 days supply | Qty: 10 | Fill #0

## 2017-11-26 DIAGNOSIS — E119 Type 2 diabetes mellitus without complications: Secondary | ICD-10-CM

## 2017-12-13 MED FILL — !NOVOLOG 100UNITS/ML VIAL: 100/ML | 28 days supply | Qty: 10 | Fill #2

## 2017-12-13 MED FILL — TERBINAFINE HCL 250 MG TAB: 250 | 30 days supply | Qty: 30 | Fill #2

## 2017-12-13 MED FILL — !LANTUS 100 UNITS/ML VIAL: 100 | 28 days supply | Qty: 10 | Fill #1

## 2017-12-13 MED FILL — ?METFORMIN HCL 500MG TABLET: 500 | 30 days supply | Qty: 60 | Fill #1

## 2018-01-08 ENCOUNTER — Ambulatory Visit (INDEPENDENT_AMBULATORY_CARE_PROVIDER_SITE_OTHER): Payer: Self-pay | Admitting: Physician Assistant

## 2018-01-08 ENCOUNTER — Encounter (INDEPENDENT_AMBULATORY_CARE_PROVIDER_SITE_OTHER): Payer: Self-pay | Admitting: Physician Assistant

## 2018-01-08 ENCOUNTER — Other Ambulatory Visit: Payer: Self-pay

## 2018-01-08 VITALS — BP 134/89 | HR 75 | Temp 97.9°F | Ht 71.5 in | Wt 246.2 lb

## 2018-01-08 DIAGNOSIS — B351 Tinea unguium: Secondary | ICD-10-CM

## 2018-01-08 DIAGNOSIS — Z1211 Encounter for screening for malignant neoplasm of colon: Secondary | ICD-10-CM

## 2018-01-08 DIAGNOSIS — Z1159 Encounter for screening for other viral diseases: Secondary | ICD-10-CM

## 2018-01-08 DIAGNOSIS — E119 Type 2 diabetes mellitus without complications: Secondary | ICD-10-CM

## 2018-01-08 DIAGNOSIS — Z79899 Other long term (current) drug therapy: Secondary | ICD-10-CM

## 2018-01-08 DIAGNOSIS — M5442 Lumbago with sciatica, left side: Secondary | ICD-10-CM

## 2018-01-08 DIAGNOSIS — Z23 Encounter for immunization: Secondary | ICD-10-CM

## 2018-01-08 DIAGNOSIS — G8929 Other chronic pain: Secondary | ICD-10-CM

## 2018-01-08 DIAGNOSIS — Z760 Encounter for issue of repeat prescription: Secondary | ICD-10-CM

## 2018-01-08 DIAGNOSIS — Z794 Long term (current) use of insulin: Secondary | ICD-10-CM

## 2018-01-08 LAB — POCT GLYCOSYLATED HEMOGLOBIN (HGB A1C): Hemoglobin A1C: 7.6 % — AB (ref 4.0–5.6)

## 2018-01-08 MED ORDER — GABAPENTIN 300 MG PO CAPS: 300.0000 mg | ORAL_CAPSULE | Freq: Three times a day (TID) | ORAL | 5 refills | Status: DC

## 2018-01-08 MED ORDER — DOCUSATE SODIUM 100 MG PO CAPS: 100.0000 mg | ORAL_CAPSULE | Freq: Two times a day (BID) | ORAL | 1 refills | Status: DC

## 2018-01-08 MED ORDER — METFORMIN HCL 500 MG PO TABS: 1000.0000 mg | ORAL_TABLET | Freq: Two times a day (BID) | ORAL | 11 refills | Status: DC

## 2018-01-08 MED ORDER — SITAGLIPTIN PHOSPHATE 100 MG PO TABS: 100.0000 mg | ORAL_TABLET | Freq: Every day | ORAL | 11 refills | Status: DC

## 2018-01-08 MED ORDER — BLOOD GLUCOSE MONITOR KIT
PACK | 0 refills | Status: DC
Start: 2018-01-08 — End: 2018-12-25

## 2018-01-08 MED FILL — metFORMIN HCL 500 MG TABS: 500 | 30 days supply | Qty: 120 | Fill #0

## 2018-01-08 MED FILL — JANUVIA 100 MG TABLET: 100 | 30 days supply | Qty: 30 | Fill #0

## 2018-01-08 MED FILL — GABAPENTIN 300 MG CAPSULE: 300 | 30 days supply | Qty: 90 | Fill #0

## 2018-01-08 NOTE — Progress Notes (Signed)
Subjective:  Patient ID: Spencer Robles, male    DOB: 1961/06/18  Age: 57 y.o. MRN: [id number]  CC: f/u onychomycosis  HPI Spencer Robles a 57 y.o.malewith a medical history of DM2 and OSA (does not use CPAP since 2011) presents to f/u on onychomycosis. Took Terbinafine 250 mg daily for 90 days. Reports no side effects and toe nail is growing in healthy.    Here to f/u on DM2. Last A1c 8.1%.three months ago. A1c 7.6% in clinic today. Says he has not been eating much lately and has not used insulin for some time now. Does not have a glucometer at home. Endorses numbness of the the LLE attributed to chronic LBP with sciatica. Some numbness of the left 4th finger. Has been prescribed gabapentin 100 mg TID by outside provider but says this medication is ineffective. Does not endorse any other symptoms or complaints.    Outpatient Medications Prior to Visit  Medication Sig Dispense Refill  . acetaminophen (TYLENOL) 325 MG tablet Take 2 tablets (650 mg total) by mouth every 4 (four) hours as needed for mild pain, fever or headache. (Patient not taking: Reported on 10/08/2017)    . acetaminophen (TYLENOL) 500 MG tablet Take 1,000 mg by mouth every 6 (six) hours as needed for mild pain.    . Amino Acids-Protein Hydrolys (FEEDING SUPPLEMENT, PRO-STAT SUGAR FREE 64,) LIQD Take 30 mLs by mouth 3 (three) times daily with meals. (Patient not taking: Reported on 10/08/2017)    . Amino Acids-Protein Hydrolys (FEEDING SUPPLEMENT, PRO-STAT SUGAR FREE 64,) LIQD Take 30 mLs by mouth 3 (three) times daily with meals.    . ciprofloxacin (CIPRO) 500 MG tablet Take 1 tablet (500 mg total) by mouth 2 (two) times daily. 20 tablet 0  . docusate sodium (COLACE) 100 MG capsule Take 1 capsule (100 mg total) by mouth 2 (two) times daily. (Patient not taking: Reported on 10/08/2017)    . docusate sodium (COLACE) 100 MG capsule Take 100 mg by mouth 2 (two) times daily.    Marland Kitchen ENSURE (ENSURE) Take 237 mLs by mouth 2 (two) times  daily.    . feeding supplement, ENSURE ENLIVE, (ENSURE ENLIVE) LIQD Take 237 mLs by mouth 2 (two) times daily between meals. (Patient not taking: Reported on 10/08/2017)    . insulin aspart (NOVOLOG) 100 UNIT/ML injection Inject 6 Units into the skin 2 (two) times daily. 10 mL 11  . insulin glargine (LANTUS) 100 UNIT/ML injection Inject 0.12 mLs (12 Units total) into the skin at bedtime. 10 mL 11  . INSULIN SYRINGE 1CC/29G 29G X 1/2" 1 ML MISC Inject 1 application into the skin 4 (four) times daily. 120 each 11  . metFORMIN (GLUCOPHAGE) 500 MG tablet Take 1 tablet (500 mg total) by mouth 2 (two) times daily with a meal. 60 tablet 5  . oxyCODONE (OXY IR/ROXICODONE) 5 MG immediate release tablet Take 1 tablet (5 mg total) by mouth every 6 (six) hours as needed for severe pain. 10 tablet 0  . polyethylene glycol (MIRALAX / GLYCOLAX) packet Take 17 g by mouth daily as needed for mild constipation.    . terbinafine (LAMISIL) 250 MG tablet Take 1 tablet (250 mg total) by mouth daily. 90 tablet 0   No facility-administered medications prior to visit.      ROS Review of Systems  Constitutional: Negative for chills, fever and malaise/fatigue.  Eyes: Negative for blurred vision.  Respiratory: Negative for shortness of breath.   Cardiovascular: Negative for chest pain and  palpitations.  Gastrointestinal: Negative for abdominal pain and nausea.  Genitourinary: Negative for dysuria and hematuria.  Musculoskeletal: Positive for back pain. Negative for joint pain and myalgias.  Skin: Negative for rash.  Neurological: Negative for tingling and headaches.       LLE numbness. Left 4th finger numbness.  Psychiatric/Behavioral: Negative for depression. The patient is not nervous/anxious.     Objective:  There were no vitals taken for this visit.  BP/Weight 10/08/2017 3/87/5643 09/22/9516  Systolic BP 841 660 -  Diastolic BP 86 68 -  Wt. (Lbs) 244.6 - 228.84  BMI 33.64 - 31.47      Physical Exam   Constitutional: He is oriented to person, place, and time.  Well developed, overweight, NAD, polite, uses cane for ambulatiion  HENT:  Head: Normocephalic and atraumatic.  Eyes: No scleral icterus.  Neck: Normal range of motion. Neck supple. No thyromegaly present.  Cardiovascular: Normal rate, regular rhythm and normal heart sounds.  Pulmonary/Chest: Effort normal and breath sounds normal.  Abdominal: Soft. Bowel sounds are normal. There is no tenderness.  Musculoskeletal: He exhibits no edema.  Neurological: He is alert and oriented to person, place, and time.  Skin: Skin is warm and dry. No rash noted. No erythema. No pallor.  Psychiatric: He has a normal mood and affect. His behavior is normal. Thought content normal.  Vitals reviewed.    Assessment & Plan:   1. Onychomycosis - Healthy nail growing in. No complications with Terbinafine use. Will need podiatry referral to exfoliate soles of the thick accumulation of fungus.   2. High risk medication use - Comprehensive metabolic panel  3. Type 2 diabetes mellitus without complication, with long-term current use of insulin (HCC) - HgB A1c 7.6% today. Down from 8.1% without the use of insulin (per pt). - Stop Lantus and Novolog - Increase Metformin to 1000 mg BID - Begin Januvia 100 mg qday - Comprehensive metabolic panel - Microalbumin / creatinine urine ratio - Pt advised that he will need a diabetic retinopathy screening. Says he is unable to afford and there are no ophthalmologists that would accept CAFA.   4. Need for prophylactic vaccination against Streptococcus pneumoniae (pneumococcus) - Pneumococcal polysaccharide vaccine 23-valent greater than or equal to 2yo subcutaneous/IM  5. Need for Tdap vaccination - Tdap vaccine greater than or equal to 7yo IM  6. Need for hepatitis C screening test - Hepatitis c antibody (reflex)  7. Screening for colon cancer - Fecal occult blood, imunochemical  8. 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  - Will need orthopedic evaluation and/or PT but needs CAFA to be completed.    Follow-up: 8 weeks for repeat foot exam and possible referrals after CAFA process complete.  Clent Demark PA

## 2018-01-08 NOTE — Patient Instructions (Addendum)
I have messaged the finance department at [facility]. Mr. Melbourne Abts should be reaching out to you to give you further instruction on your financial assistance application.    Sciatica Sciatica is pain, numbness, weakness, or tingling along the path of the sciatic nerve. The sciatic nerve starts in the lower back and runs down the back of each leg. The nerve controls the muscles in the lower leg and in the back of the knee. It also provides feeling (sensation) to the back of the thigh, the lower leg, and the sole of the foot. Sciatica is a symptom of another medical condition that pinches or puts pressure on the sciatic nerve. Generally, sciatica only affects one side of the body. Sciatica usually goes away on its own or with treatment. In some cases, sciatica may keep coming back (recur). What are the causes? This condition is caused by pressure on the sciatic nerve, or pinching of the sciatic nerve. This may be the result of:  A disk in between the bones of the spine (vertebrae) bulging out too far (herniated disk).  Age-related changes in the spinal disks (degenerative disk disease).  A pain disorder that affects a muscle in the buttock (piriformis syndrome).  Extra bone growth (bone spur) near the sciatic nerve.  An injury or break (fracture) of the pelvis.  Pregnancy.  Tumor (rare).  What increases the risk? The following factors may make you more likely to develop this condition:  Playing sports that place pressure or stress on the spine, such as football or weight lifting.  Having poor strength and flexibility.  A history of back injury.  A history of back surgery.  Sitting for long periods of time.  Doing activities that involve repetitive bending or lifting.  Obesity.  What are the signs or symptoms? Symptoms can vary from mild to very severe, and they may include:  Any of these problems in the lower back, leg, hip, or buttock: ? Mild tingling or dull aches. ? Burning  sensations. ? Sharp pains.  Numbness in the back of the calf or the sole of the foot.  Leg weakness.  Severe back pain that makes movement difficult.  These symptoms may get worse when you cough, sneeze, or laugh, or when you sit or stand for long periods of time. Being overweight may also make symptoms worse. In some cases, symptoms may recur over time. How is this diagnosed? This condition may be diagnosed based on:  Your symptoms.  A physical exam. Your health care provider may ask you to do certain movements to check whether those movements trigger your symptoms.  You may have tests, including: ? Blood tests. ? X-rays. ? MRI. ? CT scan.  How is this treated? In many cases, this condition improves on its own, without any treatment. However, treatment may include:  Reducing or modifying physical activity during periods of pain.  Exercising and stretching to strengthen your abdomen and improve the flexibility of your spine.  Icing and applying heat to the affected area.  Medicines that help: ? To relieve pain and swelling. ? To relax your muscles.  Injections of medicines that help to relieve pain, irritation, and inflammation around the sciatic nerve (steroids).  Surgery.  Follow these instructions at home: Medicines  Take over-the-counter and prescription medicines only as told by your health care provider.  Do not drive or operate heavy machinery while taking prescription pain medicine. Managing pain  If directed, apply ice to the affected area. ? Put ice in  a plastic bag. ? Place a towel between your skin and the bag. ? Leave the ice on for 20 minutes, 2-3 times a day.  After icing, apply heat to the affected area before you exercise or as often as told by your health care provider. Use the heat source that your health care provider recommends, such as a moist heat pack or a heating pad. ? Place a towel between your skin and the heat source. ? Leave the  heat on for 20-30 minutes. ? Remove the heat if your skin turns bright red. This is especially important if you are unable to feel pain, heat, or cold. You may have a greater risk of getting burned. Activity  Return to your normal activities as told by your health care provider. Ask your health care provider what activities are safe for you. ? Avoid activities that make your symptoms worse.  Take brief periods of rest throughout the day. Resting in a lying or standing position is usually better than sitting to rest. ? When you rest for longer periods, mix in some mild activity or stretching between periods of rest. This will help to prevent stiffness and pain. ? Avoid sitting for long periods of time without moving. Get up and move around at least one time each hour.  Exercise and stretch regularly, as told by your health care provider.  Do not lift anything that is heavier than 10 lb (4.5 kg) while you have symptoms of sciatica. When you do not have symptoms, you should still avoid heavy lifting, especially repetitive heavy lifting.  When you lift objects, always use proper lifting technique, which includes: ? Bending your knees. ? Keeping the load close to your body. ? Avoiding twisting. General instructions  Use good posture. ? Avoid leaning forward while sitting. ? Avoid hunching over while standing.  Maintain a healthy weight. Excess weight puts extra stress on your back and makes it difficult to maintain good posture.  Wear supportive, comfortable shoes. Avoid wearing high heels.  Avoid sleeping on a mattress that is too soft or too hard. A mattress that is firm enough to support your back when you sleep may help to reduce your pain.  Keep all follow-up visits as told by your health care provider. This is important. Contact a health care provider if:  You have pain that wakes you up when you are sleeping.  You have pain that gets worse when you lie down.  Your pain is  worse than you have experienced in the past.  Your pain lasts longer than 4 weeks.  You experience unexplained weight loss. Get help right away if:  You lose control of your bowel or bladder (incontinence).  You have: ? Weakness in your lower back, pelvis, buttocks, or legs that gets worse. ? Redness or swelling of your back. ? A burning sensation when you urinate. This information is not intended to replace advice given to you by your health care provider. Make sure you discuss any questions you have with your health care provider. Document Released: 07/04/2001 Document Revised: 12/14/2015 Document Reviewed: 03/19/2015 Elsevier Interactive Patient Education  [employer].

## 2018-01-10 LAB — COMPREHENSIVE METABOLIC PANEL
ALK PHOS: 84 IU/L (ref 39–117)
ALT: 17 IU/L (ref 0–44)
AST: 16 IU/L (ref 0–40)
Albumin/Globulin Ratio: 1.6 (ref 1.2–2.2)
Albumin: 4.9 g/dL (ref 3.5–5.5)
BILIRUBIN TOTAL: 0.4 mg/dL (ref 0.0–1.2)
BUN/Creatinine Ratio: 14 (ref 9–20)
BUN: 12 mg/dL (ref 6–24)
CHLORIDE: 97 mmol/L (ref 96–106)
CO2: 26 mmol/L (ref 20–29)
CREATININE: 0.87 mg/dL (ref 0.76–1.27)
Calcium: 10.6 mg/dL — ABNORMAL HIGH (ref 8.7–10.2)
GFR calc Af Amer: 110 mL/min/{1.73_m2} (ref >59)
GFR calc non Af Amer: 95 mL/min/{1.73_m2} (ref >59)
Globulin, Total: 3 g/dL (ref 1.5–4.5)
Glucose: 141 mg/dL — ABNORMAL HIGH (ref 65–99)
Potassium: 4.5 mmol/L (ref 3.5–5.2)
Sodium: 135 mmol/L (ref 134–144)
Total Protein: 7.9 g/dL (ref 6.0–8.5)

## 2018-01-10 LAB — MICROALBUMIN / CREATININE URINE RATIO
CREATININE, UR: 155.4 mg/dL
MICROALBUM., U, RANDOM: 65.2 ug/mL
Microalb/Creat Ratio: 42 mg/g creat — ABNORMAL HIGH (ref 0.0–30.0)

## 2018-01-10 LAB — HEPATITIS C ANTIBODY (REFLEX)

## 2018-01-10 LAB — HCV COMMENT:

## 2018-01-11 ENCOUNTER — Ambulatory Visit (INDEPENDENT_AMBULATORY_CARE_PROVIDER_SITE_OTHER): Payer: Self-pay | Admitting: Physician Assistant

## 2018-01-11 ENCOUNTER — Telehealth (INDEPENDENT_AMBULATORY_CARE_PROVIDER_SITE_OTHER): Payer: Self-pay

## 2018-01-11 NOTE — Telephone Encounter (Signed)
-----   Message from Clent Demark, PA-C sent at 01/10/2018  1:41 PM EDT ----- Overall kidney function is normal. Some expulsion of proteins in urine but minimal. HCV negative.

## 2018-01-11 NOTE — Telephone Encounter (Signed)
Patient is aware of normal kidney function and negative hep C. Patient states that he did not return back to work in march as stated in the [employer] documents. He felt as though his health was not in good enough for him to return. He is now requesting additional FMLA documents be completed giving a projected return to work date for him. Nat Christen, CMA

## 2018-01-14 NOTE — Telephone Encounter (Signed)
Just make sure he brings me his FMLA paperwork which is usually given to him or faxed to [place] from his employer.

## 2018-01-14 NOTE — Telephone Encounter (Signed)
Patient aware. Spencer Robles, CMA  

## 2018-02-14 ENCOUNTER — Telehealth (INDEPENDENT_AMBULATORY_CARE_PROVIDER_SITE_OTHER): Payer: Self-pay | Admitting: Physician Assistant

## 2018-02-19 ENCOUNTER — Encounter (HOSPITAL_COMMUNITY): Payer: Self-pay

## 2018-02-19 ENCOUNTER — Emergency Department (HOSPITAL_COMMUNITY)
Admission: EM | Admit: 2018-02-19 | Discharge: 2018-02-19 | Disposition: A | Discharge status: Home / Self Care | Payer: Self-pay | Attending: Emergency Medicine | Admitting: Emergency Medicine

## 2018-02-19 DIAGNOSIS — G629 Polyneuropathy, unspecified: Secondary | ICD-10-CM

## 2018-02-19 DIAGNOSIS — E119 Type 2 diabetes mellitus without complications: Secondary | ICD-10-CM | POA: Insufficient documentation

## 2018-02-19 DIAGNOSIS — R200 Anesthesia of skin: Secondary | ICD-10-CM | POA: Insufficient documentation

## 2018-02-19 DIAGNOSIS — R1084 Generalized abdominal pain: Secondary | ICD-10-CM

## 2018-02-19 DIAGNOSIS — G9009 Other idiopathic peripheral autonomic neuropathy: Secondary | ICD-10-CM | POA: Insufficient documentation

## 2018-02-19 DIAGNOSIS — Z7984 Long term (current) use of oral hypoglycemic drugs: Secondary | ICD-10-CM | POA: Insufficient documentation

## 2018-02-19 LAB — LIPASE, BLOOD: Lipase: 63 U/L — ABNORMAL HIGH (ref 11–51)

## 2018-02-19 LAB — CBC WITH DIFFERENTIAL/PLATELET
Abs Immature Granulocytes: 0 10*3/uL (ref 0.0–0.1)
Basophils Absolute: 0.1 10*3/uL (ref 0.0–0.1)
Basophils Relative: 1 %
Eosinophils Absolute: 0.5 10*3/uL (ref 0.0–0.7)
Eosinophils Relative: 5 %
HEMATOCRIT: 38.8 % — AB (ref 39.0–52.0)
HEMOGLOBIN: 13.1 g/dL (ref 13.0–17.0)
IMMATURE GRANULOCYTES: 0 %
LYMPHS ABS: 2.8 10*3/uL (ref 0.7–4.0)
LYMPHS PCT: 27 %
MCH: 28.6 pg (ref 26.0–34.0)
MCHC: 33.8 g/dL (ref 30.0–36.0)
MCV: 84.7 fL (ref 78.0–100.0)
Monocytes Absolute: 0.6 10*3/uL (ref 0.1–1.0)
Monocytes Relative: 6 %
NEUTROS PCT: 61 %
Neutro Abs: 6.2 10*3/uL (ref 1.7–7.7)
Platelets: 149 10*3/uL — ABNORMAL LOW (ref 150–400)
RBC: 4.58 MIL/uL (ref 4.22–5.81)
RDW: 12.8 % (ref 11.5–15.5)
WBC: 10.1 10*3/uL (ref 4.0–10.5)

## 2018-02-19 LAB — URINALYSIS, ROUTINE W REFLEX MICROSCOPIC
BILIRUBIN URINE: NEGATIVE
Bacteria, UA: NONE SEEN
Glucose, UA: NEGATIVE mg/dL
Hgb urine dipstick: NEGATIVE
KETONES UR: NEGATIVE mg/dL
Nitrite: NEGATIVE
PROTEIN: NEGATIVE mg/dL
SPECIFIC GRAVITY, URINE: 1.018 (ref 1.005–1.030)
pH: 7 (ref 5.0–8.0)

## 2018-02-19 LAB — COMPREHENSIVE METABOLIC PANEL
ALK PHOS: 65 U/L (ref 38–126)
ALT: 15 U/L (ref 0–44)
AST: 18 U/L (ref 15–41)
Albumin: 3.9 g/dL (ref 3.5–5.0)
Anion gap: 10 (ref 5–15)
BILIRUBIN TOTAL: 0.9 mg/dL (ref 0.3–1.2)
BUN: 8 mg/dL (ref 6–20)
CO2: 28 mmol/L (ref 22–32)
Calcium: 9.8 mg/dL (ref 8.9–10.3)
Chloride: 100 mmol/L (ref 98–111)
Creatinine, Ser: 0.8 mg/dL (ref 0.61–1.24)
GFR calc Af Amer: >60 mL/min (ref >60)
GFR calc non Af Amer: >60 mL/min (ref >60)
GLUCOSE: 122 mg/dL — AB (ref 70–99)
POTASSIUM: 4.4 mmol/L (ref 3.5–5.1)
Sodium: 138 mmol/L (ref 135–145)
TOTAL PROTEIN: 6.7 g/dL (ref 6.5–8.1)

## 2018-02-19 MED ORDER — POLYETHYLENE GLYCOL 3350 17 G PO PACK: 17.0000 g | PACK | Freq: Every day | ORAL | 0 refills | Status: DC

## 2018-02-19 NOTE — ED Provider Notes (Signed)
[facility] DEPART[facility] Provider Note   CSN: [id number] Arrival date & time: 02/19/18  1616     History   Chief Complaint No chief complaint on file.   HPI Spencer Robles is a 57 y.o. male.  He presents with multiple complaints most of which sound very chronic.  He has continued numbness and tingling and neuropathic pain in both legs from his knees down to his toes.  He also complains of calluses on his feet that make his ambulation more difficult.  His calves feel tight at times.  He also has noticed some numbness in his left hand on and off.  He also is complaining of some right-sided abdominal pain.  That one is been going on a couple weeks.  He states his stools have been soft and hard.  No fevers no chills no nausea no vomiting.  I asked him if he is seen his doctor for these things and he said he could get a ride to go see him.  The history is provided by the patient.  Abdominal Pain   This is a new problem. The current episode started more than 1 week ago. The problem has not changed since onset.The pain is associated with an unknown factor. The pain is located in the RLQ and RUQ. The pain is moderate. Associated symptoms include diarrhea, constipation and arthralgias (knees). Pertinent negatives include fever, belching, hematochezia, melena, nausea, vomiting, dysuria, frequency, hematuria and headaches. Nothing aggravates the symptoms. Nothing relieves the symptoms.    Past Medical History:  Diagnosis Date  . Diabetes mellitus without complication ([place])   . Epididymitis 08/15/2017  . Hx of necrotising fasciitis 06/2017   RLE/notes 08/15/2017  . Scrotal swelling    left; due to Epididymitis/notes 08/15/2017  . Sleep apnea    does not use a cpap    Patient Active Problem List   Diagnosis Date Noted  . Scrotal abscess 08/30/2017  . Abscess 08/30/2017  . Epididymoorchitis 08/15/2017  . Great toe amputation status, right ([place]) 07/24/2017  . Diabetic  foot infection ([place])   . Subacute osteomyelitis, right ankle and foot ([place])   . Diabetic foot ([place]) 07/03/2017  . Diabetes mellitus type 2, uncomplicated ([place]) 76/19/5093  . Prehypertension 04/15/2015    Past Surgical History:  Procedure Laterality Date  . AMPUTATION TOE Right 07/06/2017   Procedure: RIGHT GREAT TOE AND SECOND TOE AMPUTATION;  Surgeon: Newt Minion, MD;  Location: [facility];  Service: Orthopedics;  Laterality: Right;  . CIRCUMCISION    . MASS BIOPSY  ~ 2008   scrotum/notes 08/15/2017  . SCROTAL EXPLORATION N/A 08/21/2017   Procedure: INCISION AND DRAINAGE SCROTAL ABSCESS, CYSTOSCOPY, COMPLEX INSERTION OF URETHERAL FOLEY CATHETER;  Surgeon: Raynelle Bring, MD;  Location: [place] [facility];  Service: Urology;  Laterality: N/A;  . SCROTAL EXPLORATION N/A 08/30/2017   Procedure: SCROTUM DEBRIDEMENT;  Surgeon: Raynelle Bring, MD;  Location: [facility];  Service: Urology;  Laterality: N/A;        Home Medications    Prior to Admission medications   Medication Sig Start Date End Date Taking? Authorizing Provider  blood glucose meter kit and supplies KIT Dispense based on patient and insurance preference. Use up to four times daily as directed. (FOR ICD-9 250.00, 250.01). 01/08/18   Clent Demark, PA-C  docusate sodium (COLACE) 100 MG capsule Take 1 capsule (100 mg total) by mouth 2 (two) times daily. 01/08/18   Clent Demark, PA-C  docusate sodium (COLACE) 100 MG capsule  Take 100 mg by mouth 2 (two) times daily.    [provider]  ENSURE (ENSURE) Take 237 mLs by mouth 2 (two) times daily.    [provider]  feeding supplement, ENSURE ENLIVE, (ENSURE ENLIVE) LIQD Take 237 mLs by mouth 2 (two) times daily between meals. Patient not taking: Reported on 10/08/2017 07/10/17   Modena Jansky, MD  gabapentin (NEURONTIN) 300 MG capsule Take 1 capsule (300 mg total) by mouth 3 (three) times daily. 01/08/18   Clent Demark, PA-C  metFORMIN (GLUCOPHAGE) 500 MG tablet Take  2 tablets (1,000 mg total) by mouth 2 (two) times daily with a meal. 01/08/18   Clent Demark, PA-C  sitaGLIPtin (JANUVIA) 100 MG tablet Take 1 tablet (100 mg total) by mouth daily. 01/08/18   Clent Demark, PA-C  terbinafine (LAMISIL) 250 MG tablet Take 1 tablet (250 mg total) by mouth daily. 10/08/17   Clent Demark, PA-C    Family History History reviewed. No pertinent family history.  Social History Social History   Tobacco Use  . Smoking status: Never Smoker  . Smokeless tobacco: Never Used  Substance Use Topics  . Alcohol use: No    Alcohol/week: 0.0 oz  . Drug use: No     Allergies   Patient has no known allergies.   Review of Systems Review of Systems  Constitutional: Negative for fever.  HENT: Negative for sore throat.   Eyes: Negative for visual disturbance.  Respiratory: Negative for shortness of breath.   Cardiovascular: Negative for chest pain.  Gastrointestinal: Positive for abdominal pain, constipation and diarrhea. Negative for hematochezia, melena, nausea and vomiting.  Genitourinary: Negative for dysuria, frequency and hematuria.  Musculoskeletal: Positive for arthralgias (knees).  Skin: Negative for rash.  Neurological: Positive for numbness. Negative for speech difficulty and headaches.     Physical Exam Updated Vital Signs BP (!) 158/101 (BP Location: Right Arm)   Pulse 60   Temp 98.9 F (37.2 C) (Oral)   Resp 16   Ht 5' 11"  (1.803 m)   Wt 108.9 kg (240 lb)   SpO2 99%   BMI 33.47 kg/m   Physical Exam  Constitutional: He appears well-developed and well-nourished.  HENT:  Head: Normocephalic and atraumatic.  Right Ear: External ear normal.  Left Ear: External ear normal.  Eyes: Conjunctivae are normal.  Neck: Neck supple.  Cardiovascular: Normal rate, regular rhythm and normal heart sounds.  No murmur heard. Pulmonary/Chest: Effort normal and breath sounds normal. No respiratory distress.  Abdominal: Soft. He exhibits no  mass. There is no tenderness. There is no guarding.  Musculoskeletal: Normal range of motion. He exhibits no deformity.  Neurological: He is alert.  Skin: Skin is warm and dry.  Psychiatric: He has a normal mood and affect.  Nursing note and vitals reviewed.    ED Treatments / Results  Labs (all labs ordered are listed, but only abnormal results are displayed) Labs Reviewed  CBC WITH DIFFERENTIAL/PLATELET - Abnormal; Notable for the following components:      Result Value   HCT 38.8 (*)    Platelets 149 (*)    All other components within normal limits  COMPREHENSIVE METABOLIC PANEL - Abnormal; Notable for the following components:   Glucose, Bld 122 (*)    All other components within normal limits  LIPASE, BLOOD - Abnormal; Notable for the following components:   Lipase 63 (*)    All other components within normal limits  URINALYSIS, ROUTINE W REFLEX MICROSCOPIC -  Abnormal; Notable for the following components:   Leukocytes, UA TRACE (*)    All other components within normal limits    EKG None  Radiology No results found.  Procedures Procedures (including critical care time)  Medications Ordered in ED Medications - No data to display   Initial Impression / Assessment and Plan / ED Course  I have reviewed the triage vital signs and the nursing notes.  Pertinent labs & imaging results that were available during my care of the patient were reviewed by me and considered in my medical decision making (see chart for details).    Patient has a very benign exam and normal appearing labs.  As far as his neuropathy I told him there is nothing we can do for that in the emergency department and he would have to take this up with his primary care doctor.  His newer complaint of crampy abdominal discomfort has been going on and off for 2 weeks also appears very benign.  His exam is normal he is had no white count and normal kidney function.  It sounds like he has been having some  ongoing problems with constipation and prescribed him a laxative.  He understands he will need to follow-up with his doctor regarding this or continued management.  He will return if any worsening symptoms.  Final Clinical Impressions(s) / ED Diagnoses   Final diagnoses:  Generalized abdominal pain  Neuropathy    ED Discharge Orders        Ordered    polyethylene glycol (MIRALAX) packet  Daily     02/19/18 2255       Hayden Rasmussen, MD 02/20/18 1155

## 2018-02-19 NOTE — ED Notes (Signed)
Pt tolerated food and drink well 

## 2018-02-19 NOTE — ED Provider Notes (Signed)
Patient placed in Quick Look pathway, seen and evaluated   Chief Complaint: numbness/pain  HPI:   Difficult historian. Pt reports several months of bilateral leg and foot pain. He reports mild continued pain of scrotum, although improved since surgery in February 2019. No new fall, trauma, or injury. Pain is worse when he walks a lot. No back pain. He reports several weeks of intermittent RLQ pain, states he has been mildly constipated. No pain currently.   ROS: bilateral lower extremity pain  Physical Exam:   Gen: No distress  Neuro: Awake and Alert  Skin: Warm    Focused Exam: TTP of bilateral lower extremities. Good pedal pulses.   Abd: generalized TTP without rigidity, guarding, or distention.    Initiation of care has begun. The patient has been counseled on the process, plan, and necessity for staying for the completion/evaluation, and the remainder of the medical screening examination    Franchot Heidelberg, PA-C 02/19/18 1700    Hayden Rasmussen, MD 02/20/18 1209

## 2018-02-19 NOTE — ED Triage Notes (Addendum)
Pt presents with 2 month h/o bilateral leg numbness.  Pt reports numbness from his feet up to knees with L knee having numbness to outer aspect.  Pt also reports numbness to L hand and forearm and R fingertips.  Pt reports he has fallen 2 times and landed on L knee.  Pt also reports 2 week h/o pain to scrotum; had procedure back in February; pt reports area is healed, reports no swelling, drainage to area.  Pt also reports 2 week h/o R sided abdominal pain that is intermittent and does not radiate.  Pt reports had stools, denies any dysuria, nausea or vomiting.

## 2018-02-19 NOTE — ED Notes (Signed)
Pt given sandwich tray ginger ale and some crackers.

## 2018-02-19 NOTE — Discharge Instructions (Addendum)
You were evaluated in the emergency department for intermittent abdominal pain over the past 2 weeks and for more long-standing numbness in your lower extremities. you had blood work that did not show an obvious cause of your symptoms.  We are prescribing you a laxative to try and will be important for you to follow-up with your primary care doctor.  Please return if any worsening symptoms.

## 2018-02-19 NOTE — ED Notes (Addendum)
Pt ambulated with nurse to bathroom to assess gait. Pt given non kid socks to ambulate and advised to call nurse when finished in bathroom to help assist back to room.  Pt ambulated well with steady gait using his four point cane. Pt uses right heel when waking, states due to pain.

## 2018-02-25 MED FILL — $JANUVIA 100 MG TABLET: 100 | 30 days supply | Qty: 30 | Fill #1

## 2018-02-25 MED FILL — metFORMIN HCL 500 MG TABS: 500 | 30 days supply | Qty: 120 | Fill #1

## 2018-02-25 MED FILL — GABAPENTIN 300 MG CAPSULE: 300 | 30 days supply | Qty: 90 | Fill #1

## 2018-03-05 ENCOUNTER — Ambulatory Visit (INDEPENDENT_AMBULATORY_CARE_PROVIDER_SITE_OTHER): Payer: Self-pay | Admitting: Physician Assistant

## 2018-03-05 ENCOUNTER — Encounter (INDEPENDENT_AMBULATORY_CARE_PROVIDER_SITE_OTHER): Payer: Self-pay | Admitting: Physician Assistant

## 2018-03-05 ENCOUNTER — Other Ambulatory Visit: Payer: Self-pay

## 2018-03-05 ENCOUNTER — Ambulatory Visit (HOSPITAL_COMMUNITY)
Admission: RE | Admit: 2018-03-05 | Discharge: 2018-03-05 | Disposition: A | Discharge status: Home / Self Care | Payer: Self-pay | Source: Ambulatory Visit | Attending: Physician Assistant | Admitting: Physician Assistant

## 2018-03-05 VITALS — BP 117/74 | HR 81 | Temp 98.2°F | Ht 71.0 in | Wt 235.4 lb

## 2018-03-05 DIAGNOSIS — G8929 Other chronic pain: Secondary | ICD-10-CM | POA: Insufficient documentation

## 2018-03-05 DIAGNOSIS — M5442 Lumbago with sciatica, left side: Secondary | ICD-10-CM

## 2018-03-05 DIAGNOSIS — M25562 Pain in left knee: Secondary | ICD-10-CM | POA: Insufficient documentation

## 2018-03-05 DIAGNOSIS — M1712 Unilateral primary osteoarthritis, left knee: Secondary | ICD-10-CM | POA: Insufficient documentation

## 2018-03-05 DIAGNOSIS — N492 Inflammatory disorders of scrotum: Secondary | ICD-10-CM

## 2018-03-05 MED ORDER — GABAPENTIN 300 MG PO CAPS: 600.0000 mg | ORAL_CAPSULE | Freq: Three times a day (TID) | ORAL | 5 refills | Status: DC

## 2018-03-05 MED ORDER — NAPROXEN 500 MG PO TABS: 500.0000 mg | ORAL_TABLET | Freq: Two times a day (BID) | ORAL | 0 refills | Status: DC | PRN

## 2018-03-05 MED FILL — NAPROXEN 500 MG TABLET: 500 | 15 days supply | Qty: 30 | Fill #0

## 2018-03-05 NOTE — Patient Instructions (Signed)

## 2018-03-05 NOTE — Progress Notes (Signed)
Subjective:  Patient ID: Spencer Robles, male    DOB: 22-Apr-1961  Age: 57 y.o. MRN: [id number]  CC: FMLA form, left knee pain  HPI Spencer Robles is a 57 y.o. male with a medical history of DM2, OSA, onychomycosis, right hallux/right second toe amputation, epididymitis, scrotal abscess, and necrotizing fasciitis presents with request to have another FMLA form filled. Original FMLA form filled 01/17/18 for scrotal abscess and necrotizing fasciitis that occurred on 08/15/17. Pt was expected to return to work on approximately 02/12/18. Pt did not return to work and says his job at [employer] never called him to have him return. Presents new FMLA and wants it filled so he does not lose his job. Reports scrotal abscess and necrotizing fasciitis has healed. Reportedly discharged from wound care in May 2019. No wound care notes to review. Says there is still some scrotal pain but mostly alleviated with Gabapentin. Increased Gabapentin on his own to 600 mg BID which nearly resolves his pain.     Main complaint today is of left knee pain. Chronic left knee pain since 2016 with prolonged walking. Fell twice on left knee approximately 4 weeks ago at [place] apartment [place]. Says his knee is both numb and painful. Pain felt mostly at the medial aspect of left knee. Thinks his knee pain is aggravated with certain footwear. Wears sandles which seem to alleviate left knee pain.  Has taken ibuprofen with relief "at times". No pain in right knee. Does not endorse any other associated symptoms.     Outpatient Medications Prior to Visit  Medication Sig Dispense Refill  . gabapentin (NEURONTIN) 300 MG capsule Take 1 capsule (300 mg total) by mouth 3 (three) times daily. 90 capsule 5  . metFORMIN (GLUCOPHAGE) 500 MG tablet Take 2 tablets (1,000 mg total) by mouth 2 (two) times daily with a meal. 120 tablet 11  . sitaGLIPtin (JANUVIA) 100 MG tablet Take 1 tablet (100 mg total) by mouth daily. 30 tablet 11  .  blood glucose meter kit and supplies KIT Dispense based on patient and insurance preference. Use up to four times daily as directed. (FOR ICD-9 250.00, 250.01). 1 each 0  . docusate sodium (COLACE) 100 MG capsule Take 1 capsule (100 mg total) by mouth 2 (two) times daily. (Patient not taking: Reported on 03/05/2018) 15 capsule 1  . feeding supplement, ENSURE ENLIVE, (ENSURE ENLIVE) LIQD Take 237 mLs by mouth 2 (two) times daily between meals. (Patient not taking: Reported on 10/08/2017)    . polyethylene glycol (MIRALAX) packet Take 17 g by mouth daily. (Patient not taking: Reported on 03/05/2018) 14 each 0  . terbinafine (LAMISIL) 250 MG tablet Take 1 tablet (250 mg total) by mouth daily. (Patient not taking: Reported on 03/05/2018) 90 tablet 0  . docusate sodium (COLACE) 100 MG capsule Take 100 mg by mouth 2 (two) times daily.    Marland Kitchen ENSURE (ENSURE) Take 237 mLs by mouth 2 (two) times daily.     No facility-administered medications prior to visit.      ROS Review of Systems  Constitutional: Negative for chills, fever and malaise/fatigue.  Eyes: Negative for blurred vision.  Respiratory: Negative for shortness of breath.   Cardiovascular: Negative for chest pain and palpitations.  Gastrointestinal: Negative for abdominal pain and nausea.  Genitourinary: Negative for dysuria and hematuria.       Some scrotal pain  Musculoskeletal: Positive for joint pain. Negative for myalgias.  Skin: Negative for rash.  Neurological: Negative for  tingling and headaches.  Psychiatric/Behavioral: Negative for depression. The patient is not nervous/anxious.     Objective:  BP 117/74 (BP Location: Left Arm, Patient Position: Sitting, Cuff Size: Large)   Pulse 81   Temp 98.2 F (36.8 C) (Oral)   Ht 5' 11"  (1.803 m)   Wt 235 lb 6.4 oz (106.8 kg)   SpO2 97%   BMI 32.83 kg/m   BP/Weight 03/05/2018 02/19/2018 3/81/7711  Systolic BP 657 903 833  Diastolic BP 74 87 89  Wt. (Lbs) 235.4 240 246.2  BMI 32.83  33.47 33.86      Physical Exam  Constitutional: He is oriented to person, place, and time.  Well developed, well nourished, NAD, polite, using four point cane.  HENT:  Head: Normocephalic and atraumatic.  Eyes: No scleral icterus.  Neck: Normal range of motion. Neck supple. No thyromegaly present.  Cardiovascular: Normal rate, regular rhythm and normal heart sounds.  Pulmonary/Chest: Effort normal and breath sounds normal.  Genitourinary:  Genitourinary Comments: Scrotum with no erythema, edema, ecchymosis, or tenderness to palpation. Testicles no tender or edematous. No penile discharge or lesions.   Musculoskeletal: He exhibits no edema.  Left knee with bony hypertrophy and tenderness in the medial midline. Full aROM of the left knee. Negative anterior and posterior drawer test, varus/valgus stress test, and patellar apprehension. Amputation of the right hallux and right second toe.  Neurological: He is alert and oriented to person, place, and time.  Mildly antalgic gait favoring left side  Skin: Skin is warm and dry. No rash noted. No erythema. No pallor.  Psychiatric: He has a normal mood and affect. His behavior is normal. Thought content normal.  Vitals reviewed.    Assessment & Plan:   1. Chronic pain of left knee - DG Knee Complete 4 Views Left; Future - Begin naproxen (NAPROSYN) 500 MG tablet; Take 1 tablet (500 mg total) by mouth 2 (two) times daily as needed.  Dispense: 30 tablet; Refill: 0 - AMB referral to orthopedics  2. Scrotal abscess - No indication of infection. Reportedly completed wound care sessions and discharged in May 2019.  - I have printed a copy of original FMLA form dated 01/17/18 and given the copy to patient. There is no change in the recommendations of the FMLA and patient has recovered from abscess/necrotizing fasciitis. Therefore, no new FMLA will be written.     Meds ordered this encounter  Medications  . naproxen (NAPROSYN) 500 MG tablet     Sig: Take 1 tablet (500 mg total) by mouth 2 (two) times daily as needed.    Dispense:  30 tablet    Refill:  0    Order Specific Question:   Supervising Provider    Answer:   Charlott Rakes [[id number]]    Follow-up: Return in about 4 weeks (around 04/02/2018) for knee pain.   Clent Demark PA

## 2018-03-05 NOTE — Progress Notes (Signed)
Pt complains he still has pain in scrotum Pt complains of numbness in shin Pt complains of pain in the outer left knee

## 2018-03-06 ENCOUNTER — Telehealth (INDEPENDENT_AMBULATORY_CARE_PROVIDER_SITE_OTHER): Payer: Self-pay

## 2018-03-06 NOTE — Telephone Encounter (Signed)
Patient is aware that XR revealed mild osteoarthritis of the left knee. Referral to ortho already placed. They will likely inject your knee. Nat Christen, CMA

## 2018-03-06 NOTE — Telephone Encounter (Signed)
-----   Message from Clent Demark, PA-C sent at 03/06/2018 12:37 PM EDT ----- Mild osteoarthritis of the left knee. I have already made ortho referral during his visit. They will likely inject his knee.

## 2018-04-01 ENCOUNTER — Other Ambulatory Visit (INDEPENDENT_AMBULATORY_CARE_PROVIDER_SITE_OTHER): Payer: Self-pay | Admitting: Physician Assistant

## 2018-04-01 DIAGNOSIS — M25562 Pain in left knee: Principal | ICD-10-CM

## 2018-04-01 DIAGNOSIS — G8929 Other chronic pain: Secondary | ICD-10-CM

## 2018-04-01 MED FILL — metFORMIN HCL 500 MG TABS: 500 | 30 days supply | Qty: 120 | Fill #2

## 2018-04-01 MED FILL — $JANUVIA 100 MG TABLET: 100 | 30 days supply | Qty: 30 | Fill #2

## 2018-04-01 MED FILL — GABAPENTIN 300 MG CAPSULE: 300 | 30 days supply | Qty: 90 | Fill #2

## 2018-04-01 NOTE — Telephone Encounter (Signed)
FWD to PCP. Tyshon Fanning S Jerica Creegan, CMA  

## 2018-04-16 ENCOUNTER — Emergency Department (HOSPITAL_COMMUNITY): Payer: Self-pay

## 2018-04-16 ENCOUNTER — Emergency Department (HOSPITAL_COMMUNITY)
Admission: EM | Admit: 2018-04-16 | Discharge: 2018-04-17 | Disposition: A | Discharge status: Home / Self Care | Payer: Self-pay | Attending: Emergency Medicine | Admitting: Emergency Medicine

## 2018-04-16 ENCOUNTER — Other Ambulatory Visit: Payer: Self-pay

## 2018-04-16 ENCOUNTER — Encounter (HOSPITAL_COMMUNITY): Payer: Self-pay | Admitting: Emergency Medicine

## 2018-04-16 DIAGNOSIS — L03114 Cellulitis of left upper limb: Secondary | ICD-10-CM | POA: Insufficient documentation

## 2018-04-16 DIAGNOSIS — Z79899 Other long term (current) drug therapy: Secondary | ICD-10-CM | POA: Insufficient documentation

## 2018-04-16 DIAGNOSIS — E119 Type 2 diabetes mellitus without complications: Secondary | ICD-10-CM | POA: Insufficient documentation

## 2018-04-16 DIAGNOSIS — L0291 Cutaneous abscess, unspecified: Secondary | ICD-10-CM

## 2018-04-16 DIAGNOSIS — L02512 Cutaneous abscess of left hand: Secondary | ICD-10-CM | POA: Insufficient documentation

## 2018-04-16 LAB — CBC WITH DIFFERENTIAL/PLATELET
Abs Immature Granulocytes: 0.1 10*3/uL (ref 0.0–0.1)
BASOS ABS: 0 10*3/uL (ref 0.0–0.1)
BASOS PCT: 0 %
Eosinophils Absolute: 0.3 10*3/uL (ref 0.0–0.7)
Eosinophils Relative: 3 %
HCT: 38.1 % — ABNORMAL LOW (ref 39.0–52.0)
Hemoglobin: 12.4 g/dL — ABNORMAL LOW (ref 13.0–17.0)
Immature Granulocytes: 0 %
Lymphocytes Relative: 20 %
Lymphs Abs: 2.4 10*3/uL (ref 0.7–4.0)
MCH: 28.5 pg (ref 26.0–34.0)
MCHC: 32.5 g/dL (ref 30.0–36.0)
MCV: 87.6 fL (ref 78.0–100.0)
MONO ABS: 0.9 10*3/uL (ref 0.1–1.0)
Monocytes Relative: 7 %
Neutro Abs: 8.1 10*3/uL — ABNORMAL HIGH (ref 1.7–7.7)
Neutrophils Relative %: 70 %
Platelets: 151 10*3/uL (ref 150–400)
RBC: 4.35 MIL/uL (ref 4.22–5.81)
RDW: 12.7 % (ref 11.5–15.5)
WBC: 11.8 10*3/uL — ABNORMAL HIGH (ref 4.0–10.5)

## 2018-04-16 LAB — COMPREHENSIVE METABOLIC PANEL
ALBUMIN: 4.1 g/dL (ref 3.5–5.0)
ALK PHOS: 78 U/L (ref 38–126)
ALT: 16 U/L (ref 0–44)
ANION GAP: 10 (ref 5–15)
AST: 20 U/L (ref 15–41)
BILIRUBIN TOTAL: 0.8 mg/dL (ref 0.3–1.2)
BUN: 9 mg/dL (ref 6–20)
CALCIUM: 9.6 mg/dL (ref 8.9–10.3)
CO2: 27 mmol/L (ref 22–32)
Chloride: 102 mmol/L (ref 98–111)
Creatinine, Ser: 0.75 mg/dL (ref 0.61–1.24)
GFR calc Af Amer: >60 mL/min (ref >60)
GFR calc non Af Amer: >60 mL/min (ref >60)
GLUCOSE: 95 mg/dL (ref 70–99)
Potassium: 3.9 mmol/L (ref 3.5–5.1)
Sodium: 139 mmol/L (ref 135–145)
TOTAL PROTEIN: 7.3 g/dL (ref 6.5–8.1)

## 2018-04-16 LAB — I-STAT CG4 LACTIC ACID, ED: Lactic Acid, Venous: 1.47 mmol/L (ref 0.5–1.9)

## 2018-04-16 MED ORDER — VANCOMYCIN HCL IN DEXTROSE 1-5 GM/200ML-% IV SOLN
1000.0000 mg | Freq: Once | INTRAVENOUS | Status: AC
  Administered 2018-04-16: 1000 mg via INTRAVENOUS
  Filled 2018-04-16: qty 200

## 2018-04-16 MED ORDER — CEFAZOLIN SODIUM-DEXTROSE 1-4 GM/50ML-% IV SOLN
1.0000 g | Freq: Once | INTRAVENOUS | Status: AC
  Administered 2018-04-16: 1 g via INTRAVENOUS
  Filled 2018-04-16: qty 50

## 2018-04-16 MED ORDER — LIDOCAINE HCL (PF) 1 % IJ SOLN
5.0000 mL | Freq: Once | INTRAMUSCULAR | Status: AC
  Administered 2018-04-16: 5 mL
  Filled 2018-04-16: qty 5

## 2018-04-16 MED ORDER — SULFAMETHOXAZOLE-TRIMETHOPRIM 800-160 MG PO TABS: 1.0000 | ORAL_TABLET | Freq: Two times a day (BID) | ORAL | 0 refills | Status: AC

## 2018-04-16 NOTE — ED Provider Notes (Signed)
Patient placed in Quick Look pathway, seen and evaluated   Chief Complaint: Left hand swelling  HPI:  Patient with history of diabetes reports swelling, pain and erythema over the dorsum of his right hand worsening over past two days.  Started with a pustule over his right thumb which he opened with a clean insulin needle and was able to express pus. No fevers. Doesn't check his blood sugar at home.   ROS: No fever  Physical Exam:   Gen: No distress  Neuro: Awake and Alert  Skin: Warm    Focused Exam: Dorsum of right hand is erythematous, warm, swollen and tender to the touch. No erythema or pain over palm of hand. Able to make a fist without difficulty.    Initiation of care has begun. The patient has been counseled on the process, plan, and necessity for staying for the completion/evaluation, and the remainder of the medical screening examination    Bernarda Caffey 04/16/18 [place], [place], DO 04/16/18 2231

## 2018-04-16 NOTE — ED Provider Notes (Signed)
[facility] EMERGENCY DEPARTMENT Provider Note   CSN: [id number] Arrival date & time: 04/16/18  1615   History   Chief Complaint Chief Complaint  Patient presents with  . Hand Pain    HPI Spencer Robles is a 57 y.o. male.  HPI  57yo M with PMH significant for DM, peripheral neuropathy, s/p amputation of R great toe, presents with concern for left hand wound.  Patient states that it is pressure really noticed a "pimple" on the dorsal lateral aspect of left hand at the base of the first digit.  Patient states that he attempted to self drained by heating a sharp object and stabbing it to drain.  Patient did this multiple times.  Initially patient noted progressive worsening of pustule with increased amount of drainage and swelling.  Patient presented to ED for further evaluation.  Patient denies fevers, chills, nausea or vomiting.  Denies prior injury to the hand.  Past Medical History:  Diagnosis Date  . Diabetes mellitus without complication ([place])   . Epididymitis 08/15/2017  . Hx of necrotising fasciitis 06/2017   RLE/notes 08/15/2017  . Scrotal swelling    left; due to Epididymitis/notes 08/15/2017  . Sleep apnea    does not use a cpap    Patient Active Problem List   Diagnosis Date Noted  . Chronic pain of left knee 03/05/2018  . Scrotal abscess 08/30/2017  . Abscess 08/30/2017  . Epididymoorchitis 08/15/2017  . Great toe amputation status, right ([place]) 07/24/2017  . Diabetic foot infection ([place])   . Subacute osteomyelitis, right ankle and foot ([place])   . Diabetic foot ([place]) 07/03/2017  . Diabetes mellitus type 2, uncomplicated ([place]) 35/57/3220  . Prehypertension 04/15/2015    Past Surgical History:  Procedure Laterality Date  . AMPUTATION TOE Right 07/06/2017   Procedure: RIGHT GREAT TOE AND SECOND TOE AMPUTATION;  Surgeon: Newt Minion, MD;  Location: [facility];  Service: Orthopedics;  Laterality: Right;  . CIRCUMCISION    . MASS BIOPSY  ~ 2008   scrotum/notes 08/15/2017  . SCROTAL EXPLORATION N/A 08/21/2017   Procedure: INCISION AND DRAINAGE SCROTAL ABSCESS, CYSTOSCOPY, COMPLEX INSERTION OF URETHERAL FOLEY CATHETER;  Surgeon: Raynelle Bring, MD;  Location: [facility];  Service: Urology;  Laterality: N/A;  . SCROTAL EXPLORATION N/A 08/30/2017   Procedure: SCROTUM DEBRIDEMENT;  Surgeon: Raynelle Bring, MD;  Location: [facility];  Service: Urology;  Laterality: N/A;        Home Medications    Prior to Admission medications   Medication Sig Start Date End Date Taking? Authorizing Provider  gabapentin (NEURONTIN) 300 MG capsule Take 2 capsules (600 mg total) by mouth 3 (three) times daily. 03/05/18  Yes Clent Demark, PA-C  ibuprofen (ADVIL,MOTRIN) 200 MG tablet Take 200 mg by mouth every 6 (six) hours as needed for mild pain.   Yes [provider]  metFORMIN (GLUCOPHAGE) 500 MG tablet Take 2 tablets (1,000 mg total) by mouth 2 (two) times daily with a meal. 01/08/18  Yes Clent Demark, PA-C  sitaGLIPtin (JANUVIA) 100 MG tablet Take 1 tablet (100 mg total) by mouth daily. 01/08/18  Yes Clent Demark, PA-C  blood glucose meter kit and supplies KIT Dispense based on patient and insurance preference. Use up to four times daily as directed. (FOR ICD-9 250.00, 250.01). 01/08/18   Clent Demark, PA-C  naproxen (NAPROSYN) 500 MG tablet TAKE 1 TABLET BY MOUTH TWICE DAILY AS NEEDED 04/01/18   Clent Demark, PA-C  sulfamethoxazole-trimethoprim (BACTRIM  DS,SEPTRA DS) 800-160 MG tablet Take 1 tablet by mouth 2 (two) times daily for 7 days. 04/16/18 04/23/18  Keenan Bachelor, MD    Family History No family history on file.  Social History Social History   Tobacco Use  . Smoking status: Never Smoker  . Smokeless tobacco: Never Used  Substance Use Topics  . Alcohol use: No    Alcohol/week: 0.0 standard drinks  . Drug use: No     Allergies   Patient has no known allergies.   Review of Systems Review of Systems    Constitutional: Negative for chills and fever.  HENT: Negative for ear pain and sore throat.   Eyes: Negative for pain and visual disturbance.  Respiratory: Negative for cough and shortness of breath.   Cardiovascular: Negative for chest pain and palpitations.  Gastrointestinal: Negative for abdominal pain and vomiting.  Genitourinary: Negative for dysuria and hematuria.  Musculoskeletal: Negative for arthralgias and back pain.  Skin: Positive for rash and wound. Negative for color change.  Neurological: Negative for seizures and syncope.  All other systems reviewed and are negative.    Physical Exam Updated Vital Signs BP (!) 145/96   Pulse 60   Temp 98 F (36.7 C) (Oral)   Resp 14   SpO2 99%   Physical Exam  Constitutional: He appears well-developed and well-nourished.  HENT:  Head: Normocephalic and atraumatic.  Eyes: Conjunctivae are normal.  Neck: Neck supple.  Cardiovascular: Normal rate and regular rhythm.  No murmur heard. Pulmonary/Chest: Effort normal and breath sounds normal. No respiratory distress.  Abdominal: Soft. There is no tenderness.  Musculoskeletal: He exhibits no edema.       Arms:      Feet:  Patient without fusiform swelling of fingers, exquisite pain to palpation of palmar aspect of hand, or decreased range of motion of hand.  No overlying blue/purple discoloration, no palpation of crepitus.  Neurological: He is alert.  Skin: Skin is warm and dry.  Psychiatric: He has a normal mood and affect.  Nursing note and vitals reviewed.    ED Treatments / Results  Labs (all labs ordered are listed, but only abnormal results are displayed) Labs Reviewed  CBC WITH DIFFERENTIAL/PLATELET - Abnormal; Notable for the following components:      Result Value   WBC 11.8 (*)    Hemoglobin 12.4 (*)    HCT 38.1 (*)    Neutro Abs 8.1 (*)    All other components within normal limits  COMPREHENSIVE METABOLIC PANEL  I-STAT CG4 LACTIC ACID, ED     EKG None  Radiology Dg Hand Complete Left  Result Date: 04/16/2018 CLINICAL DATA:  Left hand swelling with puncture wound. EXAM: LEFT HAND - COMPLETE 3+ VIEW COMPARISON:  None. FINDINGS: There is no evidence of fracture or dislocation. There is no evidence of arthropathy or other focal bone abnormality. Soft tissues are unremarkable. IMPRESSION: Normal left hand. Electronically Signed   By: Marijo Conception, M.D.   On: 04/16/2018 18:50    Procedures Procedures (including critical care time)  Medications Ordered in ED Medications  ceFAZolin (ANCEF) IVPB 1 g/50 mL premix (0 g Intravenous Stopped 04/16/18 2305)  vancomycin (VANCOCIN) IVPB 1000 mg/200 mL premix (0 mg Intravenous Stopped 04/16/18 2358)  lidocaine (PF) (XYLOCAINE) 1 % injection 5 mL (5 mLs Infiltration Given 04/16/18 2235)     Initial Impression / Assessment and Plan / ED Course  I have reviewed the triage vital signs and the nursing notes.  Pertinent labs &  imaging results that were available during my care of the patient were reviewed by me and considered in my medical decision making (see chart for details).      57yo M with PMH significant for DM, peripheral neuropathy, s/p amputation of R great toe, presents with concern for left hand wound.  History as above.  DDX includes abscess, cellulitis, flexor tenosynovitis, necrotizing fasciitis.  History and physical most consistent with cellulitis with small abscess.  Doubt flexor tenosynovitis and excising fasciitis due to absence of physical exam findings consistent with the above.  Patient without systemic symptoms on history.  Patient with stable vital signs.  Patient with mild leukocytosis with mild left shift.  Bedside incision and drainage performed as above.  Patient given IV Vanco and cefazolin.  Patient provided p.o. Bactrim upon discharge.  Patient advised follow with PCP in 2 days for wound reevaluation.  Gave strict return precautions for visual evidence of  wound worsening, crepitus, fever or other systemic findings.  Patient states understanding and agreement with plan.  Stable for discharge.  Patient discharged.  Final Clinical Impressions(s) / ED Diagnoses   Final diagnoses:  Abscess  Cellulitis of left upper extremity    ED Discharge Orders         Ordered    sulfamethoxazole-trimethoprim (BACTRIM DS,SEPTRA DS) 800-160 MG tablet  2 times daily     04/16/18 2349           Keenan Bachelor, MD 04/16/18 2359    Isla Pence, MD 04/19/18 [phone]

## 2018-04-16 NOTE — ED Triage Notes (Signed)
Pt presents with left hand swelling and redness. Reports he had an abscess that he has tried to "pop" on his own without change. Denies fevers at home. Hx diabetes.

## 2018-04-17 NOTE — ED Provider Notes (Signed)
  [facility] EMERGENCY DEPARTMENT Provider Note   CSN: [id number] Arrival date & time: 04/16/18  1615   .Marland KitchenIncision and Drainage Date/Time: 04/17/2018 12:09 AM Performed by: Keenan Bachelor, MD Authorized by: Keenan Bachelor, MD   Consent:    Consent obtained:  Verbal   Consent given by:  Patient   Risks discussed:  Bleeding, incomplete drainage, pain and infection   Alternatives discussed:  No treatment and delayed treatment Location:    Type:  Abscess   Size:  1cm   Location:  Upper extremity   Upper extremity location:  Hand   Hand location:  L hand Pre-procedure details:    Skin preparation:  Betadine Anesthesia (see MAR for exact dosages):    Anesthesia method:  Local infiltration   Local anesthetic:  Lidocaine 1% w/o epi Procedure type:    Complexity:  Simple Procedure details:    Needle aspiration: no     Incision types:  Stab incision   Incision depth:  Dermal   Scalpel blade:  11   Wound management:  Irrigated with saline   Drainage:  Serosanguinous and purulent   Drainage amount:  Moderate   Wound treatment:  Wound left open   Packing materials:  None Post-procedure details:    Patient tolerance of procedure:  Tolerated well, no immediate complications      Keenan Bachelor, MD 04/17/18 Roderic Palau    Isla Pence, MD 04/17/18 4536    Isla Pence, MD 04/19/18 [phone]

## 2018-04-17 NOTE — ED Notes (Signed)
Patient Alert and oriented to baseline. Stable and ambulatory to baseline. Patient verbalized understanding of the discharge instructions.  Patient belongings were taken by the patient.   

## 2018-04-18 MED FILL — SULFAMETHOXAZOLE-TMP DS TAB: 800-160 | 7 days supply | Qty: 14 | Fill #0

## 2018-05-08 ENCOUNTER — Encounter (INDEPENDENT_AMBULATORY_CARE_PROVIDER_SITE_OTHER): Payer: Self-pay | Admitting: Physician Assistant

## 2018-05-08 ENCOUNTER — Ambulatory Visit (INDEPENDENT_AMBULATORY_CARE_PROVIDER_SITE_OTHER): Payer: Self-pay | Admitting: Physician Assistant

## 2018-05-08 ENCOUNTER — Other Ambulatory Visit: Payer: Self-pay

## 2018-05-08 VITALS — BP 119/77 | HR 91 | Temp 97.9°F | Ht 71.0 in | Wt 250.4 lb

## 2018-05-08 DIAGNOSIS — M79602 Pain in left arm: Secondary | ICD-10-CM

## 2018-05-08 DIAGNOSIS — Z760 Encounter for issue of repeat prescription: Secondary | ICD-10-CM

## 2018-05-08 DIAGNOSIS — M79601 Pain in right arm: Secondary | ICD-10-CM

## 2018-05-08 DIAGNOSIS — Z794 Long term (current) use of insulin: Secondary | ICD-10-CM

## 2018-05-08 DIAGNOSIS — E119 Type 2 diabetes mellitus without complications: Secondary | ICD-10-CM

## 2018-05-08 DIAGNOSIS — R202 Paresthesia of skin: Secondary | ICD-10-CM

## 2018-05-08 LAB — POCT GLYCOSYLATED HEMOGLOBIN (HGB A1C): Hemoglobin A1C: 6 % — AB (ref 4.0–5.6)

## 2018-05-08 MED ORDER — ASPIRIN EC 81 MG PO TBEC: 81.0000 mg | DELAYED_RELEASE_TABLET | Freq: Every day | ORAL | 3 refills | Status: DC

## 2018-05-08 MED ORDER — SITAGLIPTIN PHOSPHATE 100 MG PO TABS: 100.0000 mg | ORAL_TABLET | Freq: Every day | ORAL | 5 refills | Status: DC

## 2018-05-08 MED ORDER — METFORMIN HCL 500 MG PO TABS: 1000.0000 mg | ORAL_TABLET | Freq: Two times a day (BID) | ORAL | 5 refills | Status: DC

## 2018-05-08 MED ORDER — GABAPENTIN 300 MG PO CAPS: 600.0000 mg | ORAL_CAPSULE | Freq: Three times a day (TID) | ORAL | 5 refills | Status: DC

## 2018-05-08 NOTE — Patient Instructions (Signed)
Paresthesia Paresthesia is a burning or prickling feeling. This feeling can happen in any part of the body. It often happens in the hands, arms, legs, or feet. Usually, it is not painful. In most cases, the feeling goes away in a short time and is not a sign of a serious problem. Follow these instructions at home:  Avoid drinking alcohol.  Try massage or needle therapy (acupuncture) to help with your problems.  Keep all follow-up visits as told by your doctor. This is important. Contact a doctor if:  You keep on having episodes of paresthesia.  Your burning or prickling feeling gets worse when you walk.  You have pain or cramps.  You feel dizzy.  You have a rash. Get help right away if:  You feel weak.  You have trouble walking or moving.  You have problems speaking, understanding, or seeing.  You feel confused.  You cannot control when you pee (urinate) or poop (bowel movement).  You lose feeling (numbness) after an injury.  You pass out (faint). This information is not intended to replace advice given to you by your health care provider. Make sure you discuss any questions you have with your health care provider. Document Released: 06/22/2008 Document Revised: 12/16/2015 Document Reviewed: 07/06/2014 Elsevier Interactive Patient Education  2018 [employer]  

## 2018-05-08 NOTE — Progress Notes (Signed)
Subjective:  Patient ID: Spencer Robles, male    DOB: 1960-10-23  Age: 57 y.o. MRN: [id number]  CC: tingling, numbness, cramps  HPI Spencer Robles is a 57 y.o. male with a medical history of DM2, OSA, onychomycosis, right hallux/right second toe amputation, epididymitis, scrotal abscess, and necrotizing fasciitis presents with complaint of numbness and tingling of the UE and LE bilaterally. Also has strong cramp like pains in the left calf that is aggravated with prolonged walking or standing. Cramp like pain is not immediately relieved with rest or elevation. 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 Has run out of Gabapentin. Endorses fatigue. Does not endorse any other associated symptom.     Last A1c 7.6% four months ago. A1c 6.0% today. Taking medications as directed.   Outpatient Medications Prior to Visit  Medication Sig Dispense Refill  . gabapentin (NEURONTIN) 300 MG capsule Take 2 capsules (600 mg total) by mouth 3 (three) times daily. 180 capsule 5  . metFORMIN (GLUCOPHAGE) 500 MG tablet Take 2 tablets (1,000 mg total) by mouth 2 (two) times daily with a meal. 120 tablet 11  . sitaGLIPtin (JANUVIA) 100 MG tablet Take 1 tablet (100 mg total) by mouth daily. 30 tablet 11  . blood glucose meter kit and supplies KIT Dispense based on patient and insurance preference. Use up to four times daily as directed. (FOR ICD-9 250.00, 250.01). (Patient not taking: Reported on 05/08/2018) 1 each 0  . ibuprofen (ADVIL,MOTRIN) 200 MG tablet Take 200 mg by mouth every 6 (six) hours as needed for mild pain.    . naproxen (NAPROSYN) 500 MG tablet TAKE 1 TABLET BY MOUTH TWICE DAILY AS NEEDED 30 tablet 0   No facility-administered medications prior to visit.      ROS Review of Systems  Constitutional: Positive for malaise/fatigue. Negative for chills and fever.  Eyes: Negative for blurred vision.   Respiratory: Negative for shortness of breath.   Cardiovascular: Negative for chest pain and palpitations.  Gastrointestinal: Negative for abdominal pain and nausea.  Genitourinary: Negative for dysuria and hematuria.  Musculoskeletal: Positive for myalgias. Negative for joint pain.  Skin: Negative for rash.  Neurological: Positive for tingling. Negative for headaches.  Psychiatric/Behavioral: Negative for depression. The patient is not nervous/anxious.     Objective:  BP 119/77 (BP Location: Left Arm, Patient Position: Sitting, Cuff Size: Large)   Pulse 91   Temp 97.9 F (36.6 C) (Oral)   Ht 5' 11"  (1.803 m)   Wt 250 lb 6.4 oz (113.6 kg)   SpO2 96%   BMI 34.92 kg/m   BP/Weight 05/08/2018 04/16/2018 3/76/2831  Systolic BP 517 616 073  Diastolic BP 77 88 74  Wt. (Lbs) 250.4 - 235.4  BMI 34.92 - 32.83      Physical Exam  Constitutional: He is oriented to person, place, and time.  Well developed, well nourished, NAD, polite, using cane for ambulation  HENT:  Head: Normocephalic and atraumatic.  Eyes: No scleral icterus.  Neck: Normal range of motion. Neck supple. No thyromegaly present.  Cardiovascular: Normal rate, regular rhythm, normal heart sounds and intact distal pulses.  No LE edema bilaterally  Pulmonary/Chest: Effort normal and breath sounds normal.  Musculoskeletal: He exhibits no edema, tenderness or deformity.  Neurological: He is alert and oriented to person, place, and time.  Skin: Skin is warm and dry. No rash noted. No erythema. No pallor.  Psychiatric: He has a normal mood and affect. His behavior is normal. Thought content normal.  Vitals reviewed.    Assessment & Plan:    1. Type 2 diabetes mellitus without complication, with long-term current use of insulin (HCC) - HgB A1c 6.0% today, down from 7.6% four months ago.  - Refill sitaGLIPtin (JANUVIA) 100 MG tablet; Take 1 tablet (100 mg total) by mouth daily.  Dispense: 30 tablet; Refill: 5 -  Refill metFORMIN (GLUCOPHAGE) 500 MG tablet; Take 2 tablets (1,000 mg total) by mouth 2 (two) times daily with a meal.  Dispense: 120 tablet; Refill: 5 - Refill gabapentin (NEURONTIN) 300 MG capsule; Take 2 capsules (600 mg total) by mouth 3 (three) times daily.  Dispense: 180 capsule; Refill: 5  2. Paresthesia and pain of both upper extremities - Refill gabapentin (NEURONTIN) 300 MG capsule; Take 2 capsules (600 mg total) by mouth 3 (three) times daily.  Dispense: 180 capsule; Refill: 5 - Comprehensive metabolic panel - Sedimentation Rate - C-reactive protein - ANA w/Reflex - Magnesium - Vitamin B12 - ANCA TITERS - aspirin EC 81 MG tablet; Take 1 tablet (81 mg total) by mouth daily.  Dispense: 90 tablet; Refill: 3  3. Paresthesia of both lower extremities - gabapentin (NEURONTIN) 300 MG capsule; Take 2 capsules (600 mg total) by mouth 3 (three) times daily.  Dispense: 180 capsule; Refill: 5 - Comprehensive metabolic panel - Sedimentation Rate - C-reactive protein - ANA w/Reflex - Magnesium - Vitamin B12 - ANCA TITERS - aspirin EC 81 MG tablet; Take 1 tablet (81 mg total) by mouth daily.  Dispense: 90 tablet; Refill: 3  4. Medication refill - metFORMIN (GLUCOPHAGE) 500 MG tablet; Take 2 tablets (1,000 mg total) by mouth 2 (two) times daily with a meal.  Dispense: 120 tablet; Refill: 5   Meds ordered this encounter  Medications  . sitaGLIPtin (JANUVIA) 100 MG tablet    Sig: Take 1 tablet (100 mg total) by mouth daily.    Dispense:  30 tablet    Refill:  5    Order Specific Question:   Supervising Provider    Answer:   Charlott Rakes [[id number]]  . metFORMIN (GLUCOPHAGE) 500 MG tablet    Sig: Take 2 tablets (1,000 mg total) by mouth 2 (two) times daily with a meal.    Dispense:  120 tablet    Refill:  5    Order Specific Question:   Supervising Provider    Answer:   Charlott Rakes [[id number]]  . gabapentin (NEURONTIN) 300 MG capsule    Sig: Take 2 capsules (600 mg total) by mouth 3  (three) times daily.    Dispense:  180 capsule    Refill:  5    Order Specific Question:   Supervising Provider    Answer:   Charlott Rakes [[id number]]  . aspirin EC 81 MG tablet    Sig: Take 1 tablet (81 mg total) by mouth daily.    Dispense:  90 tablet    Refill:  3    Order Specific Question:   Supervising Provider    Answer:   Charlott Rakes [[id number]]    Follow-up: Return in about 4 weeks (around 06/05/2018) for paresthesia.   Clent Demark PA

## 2018-05-08 NOTE — Progress Notes (Signed)
Increased numbness in hands and arms and thighs

## 2018-05-10 LAB — VITAMIN B12: VITAMIN B 12: 571 pg/mL (ref 232–1245)

## 2018-05-10 LAB — COMPREHENSIVE METABOLIC PANEL
ALK PHOS: 95 IU/L (ref 39–117)
ALT: 18 IU/L (ref 0–44)
AST: 16 IU/L (ref 0–40)
Albumin/Globulin Ratio: 1.6 (ref 1.2–2.2)
Albumin: 4.7 g/dL (ref 3.5–5.5)
BILIRUBIN TOTAL: 0.4 mg/dL (ref 0.0–1.2)
BUN/Creatinine Ratio: 11 (ref 9–20)
BUN: 10 mg/dL (ref 6–24)
CHLORIDE: 99 mmol/L (ref 96–106)
CO2: 24 mmol/L (ref 20–29)
CREATININE: 0.87 mg/dL (ref 0.76–1.27)
Calcium: 10.1 mg/dL (ref 8.7–10.2)
GFR calc Af Amer: 109 mL/min/{1.73_m2} (ref >59)
GFR calc non Af Amer: 94 mL/min/{1.73_m2} (ref >59)
Globulin, Total: 3 g/dL (ref 1.5–4.5)
Glucose: 123 mg/dL — ABNORMAL HIGH (ref 65–99)
Potassium: 4 mmol/L (ref 3.5–5.2)
Sodium: 141 mmol/L (ref 134–144)
Total Protein: 7.7 g/dL (ref 6.0–8.5)

## 2018-05-10 LAB — C-REACTIVE PROTEIN: CRP: 2 mg/L (ref 0–10)

## 2018-05-10 LAB — ANA W/REFLEX: ANA: NEGATIVE

## 2018-05-10 LAB — ANCA TITERS: C-ANCA: <1:20 {titer}

## 2018-05-10 LAB — SEDIMENTATION RATE: SED RATE: 17 mm/h (ref 0–30)

## 2018-05-10 LAB — MAGNESIUM: MAGNESIUM: 1.7 mg/dL (ref 1.6–2.3)

## 2018-05-14 ENCOUNTER — Telehealth (INDEPENDENT_AMBULATORY_CARE_PROVIDER_SITE_OTHER): Payer: Self-pay

## 2018-05-14 NOTE — Telephone Encounter (Signed)
Patient is aware that all laboratory results are normal; no evidence to support cause of cramps or paresthesias(numbness). Nat Christen, CMA

## 2018-05-14 NOTE — Telephone Encounter (Signed)
Left message asking patient to return call to [facility] at [phone]. Tempestt S Roberts, CMA  

## 2018-05-14 NOTE — Telephone Encounter (Signed)
-----   Message from Clent Demark, PA-C sent at 05/13/2018  1:30 PM EDT ----- All laboratory results normal. No evidence to support cause of cramps and paresthesias.

## 2018-05-22 ENCOUNTER — Encounter (INDEPENDENT_AMBULATORY_CARE_PROVIDER_SITE_OTHER): Payer: Self-pay | Admitting: Family

## 2018-05-22 ENCOUNTER — Ambulatory Visit (INDEPENDENT_AMBULATORY_CARE_PROVIDER_SITE_OTHER): Payer: Self-pay | Admitting: Family

## 2018-05-22 ENCOUNTER — Ambulatory Visit (INDEPENDENT_AMBULATORY_CARE_PROVIDER_SITE_OTHER): Payer: Self-pay

## 2018-05-22 VITALS — Ht 71.0 in | Wt 250.4 lb

## 2018-05-22 DIAGNOSIS — R202 Paresthesia of skin: Secondary | ICD-10-CM

## 2018-05-22 DIAGNOSIS — R200 Anesthesia of skin: Secondary | ICD-10-CM

## 2018-05-22 MED ORDER — PREDNISONE 50 MG PO TABS: ORAL_TABLET | ORAL | 0 refills | Status: DC

## 2018-05-22 NOTE — Progress Notes (Signed)
Office Visit Note   Patient: Spencer Robles           Date of Birth: 14-Nov-1960           MRN: [id number] Visit Date: 05/22/2018              Requested by: Clent Demark, PA-C [place], [place] [address] PCP: Clent Demark, PA-C  Chief Complaint  Patient presents with  . Left Foot - Pain, Numbness  . Left Thigh - Pain, Numbness      HPI: Patient is a 57 year old gentleman who presents today complaining of numbness and tingling to his bilateral lower extremities as well as upper extremities.  He is status post right great toe and second toe amputation December of last year.  As relates to this has no concerns.  Today complaining of numbness to bilateral plantar aspect of his feet this is been ongoing for quite some time and gradually worsening.  States numbness is now extending up the backs of his calfs and around the anterior compartments bilaterally some cramping associated with this.  Having anterior thigh numbness bilaterally.  Wonders if this is related to scrotal abscess he has had in the last year.  No pain associated.  No aggravating or relieving factors.  No burning.  Complaining of bilateral hand numbness left worse than right.  This affects all fingers the palmar aspect as well as the dorsal aspect.  States on the right hand it began with just the fingertips and now extends all the way up to the wrist.  Denies any weakness no change in his grip strength.  No neck pain no injuries he can think of to upper extremities neck or back.  Overall very concerned that the numbness will affect his ability to work.  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  Denies dropping anything in the past as relates to the numbness.  Assessment & Plan: Visit Diagnoses:  1. Numbness and tingling of both upper extremities   2. Lower extremity numbness     Plan: Discussed numbness to the feet  discussed that this is likely diabetic neuropathy.  Does have arthritic changes in his neck we will trial prednisone course for possible radiculopathy.  We will follow-up in 4 weeks on numbness and tingling bilateral upper and lower extremities.  The patient appears to be following with his primary care for the same.  Discussed that the prednisone may cause his blood sugars to run high encouraged him to check his blood sugars more frequently.  Follow-Up Instructions: No follow-ups on file.   Back Exam   Tenderness  The patient is experiencing no tenderness.   Range of Motion  The patient has normal back ROM.  Muscle Strength  The patient has normal back strength.  Tests  Straight leg raise right: negative Straight leg raise left: negative  Comments:  Ambulates with a cane   Right Hand Exam   Tenderness  The patient is experiencing no tenderness.   Range of Motion  The patient has normal right wrist ROM.   Muscle Strength  Grip: 5/5   Tests  Tinel's sign (median nerve): negative Finkelstein's test: negative  Other  Erythema: absent Sensation: decreased   Left Hand Exam   Tenderness  The patient is experiencing no tenderness.   Range of Motion  The patient has normal left wrist ROM.  Muscle Strength  Grip:  5/5   Tests  Tinel's sign (median nerve): negative Finkelstein's test: negative  Other  Erythema: absent Sensation: decreased      Patient is alert, oriented, no adenopathy, well-dressed, normal affect, normal respiratory effort.   Imaging: No results found. No images are attached to the encounter.  Labs: Lab Results  Component Value Date   HGBA1C 6.0 (A) 05/08/2018   HGBA1C 7.6 (A) 01/08/2018   HGBA1C 8.1 (H) 09/03/2017   ESRSEDRATE 17 05/08/2018   ESRSEDRATE 70 (H) 07/03/2017   ESRSEDRATE 75 (H) 07/03/2017   CRP 2 05/08/2018   CRP 12.9 (H) 07/03/2017   CRP 14.2 (H) 07/03/2017   REPTSTATUS 08/31/2017 FINAL 08/30/2017   GRAMSTAIN   08/21/2017    RARE WBC PRESENT, PREDOMINANTLY PMN RARE GRAM POSITIVE COCCI IN CLUSTERS RARE GRAM VARIABLE ROD    CULT  08/30/2017    NO GROWTH Performed at [facility] [address], [place], [place] [address]    LABORGA METHICILLIN RESISTANT STAPHYLOCOCCUS AUREUS 08/21/2017     Lab Results  Component Value Date   ALBUMIN 4.7 05/08/2018   ALBUMIN 4.1 04/16/2018   ALBUMIN 3.9 02/19/2018   PREALBUMIN 8.5 (L) 07/03/2017    Body mass index is 34.92 kg/m.  Orders:  Orders Placed This Encounter  Procedures  . XR Cervical Spine 2 or 3 views  . XR Lumbar Spine 2-3 Views   No orders of the defined types were placed in this encounter.    Procedures: No procedures performed  Clinical Data: No additional findings.  ROS:  All other systems negative, except as noted in the HPI. Review of Systems  Constitutional: Negative for chills and fever.  Cardiovascular: Negative for leg swelling.  Musculoskeletal: Positive for myalgias. Negative for arthralgias, back pain, gait problem, neck pain and neck stiffness.  Neurological: Positive for numbness. Negative for weakness.    Objective: Vital Signs: Ht 5\' 11"  (1.803 m)   Wt 250 lb 6.4 oz (113.6 kg)   BMI 34.92 kg/m   Specialty Comments:  No specialty comments available.  PMFS History: Patient Active Problem List   Diagnosis Date Noted  . Chronic pain of left knee 03/05/2018  . Scrotal abscess 08/30/2017  . Abscess 08/30/2017  . Epididymoorchitis 08/15/2017  . Great toe amputation status, right 07/24/2017  . Diabetic foot infection ([facility])   . Subacute osteomyelitis, right ankle and foot ([facility])   . Diabetic foot ([facility]) 07/03/2017  . Diabetes mellitus type 2, uncomplicated ([facility]) 84/16/6063  . Prehypertension 04/15/2015   Past Medical History:  Diagnosis Date  . Diabetes mellitus without complication ([facility])   . Epididymitis 08/15/2017  . Hx of necrotising fasciitis 06/2017   RLE/notes 08/15/2017  . Scrotal  swelling    left; due to Epididymitis/notes 08/15/2017  . Sleep apnea    does not use a cpap    No family history on file.  Past Surgical History:  Procedure Laterality Date  . AMPUTATION TOE Right 07/06/2017   Procedure: RIGHT GREAT TOE AND SECOND TOE AMPUTATION;  Surgeon: Newt Minion, MD;  Location: [facility];  Service: Orthopedics;  Laterality: Right;  . CIRCUMCISION    . MASS BIOPSY  ~ 2008   scrotum/notes 08/15/2017  . SCROTAL EXPLORATION N/A 08/21/2017   Procedure: INCISION AND DRAINAGE SCROTAL ABSCESS, CYSTOSCOPY, COMPLEX INSERTION OF URETHERAL FOLEY CATHETER;  Surgeon: Raynelle Bring, MD;  Location: [facility];  Service: Urology;  Laterality: N/A;  . SCROTAL EXPLORATION N/A 08/30/2017   Procedure: SCROTUM DEBRIDEMENT;  Surgeon:  Raynelle Bring, MD;  Location: [facility];  Service: Urology;  Laterality: N/A;   Social History   Occupational History  . Not on file  Tobacco Use  . Smoking status: Never Smoker  . Smokeless tobacco: Never Used  Substance and Sexual Activity  . Alcohol use: No    Alcohol/week: 0.0 standard drinks  . Drug use: No  . Sexual activity: Not on file

## 2018-06-05 ENCOUNTER — Other Ambulatory Visit: Payer: Self-pay

## 2018-06-05 ENCOUNTER — Ambulatory Visit (INDEPENDENT_AMBULATORY_CARE_PROVIDER_SITE_OTHER): Payer: Self-pay | Admitting: Physician Assistant

## 2018-06-05 ENCOUNTER — Encounter (INDEPENDENT_AMBULATORY_CARE_PROVIDER_SITE_OTHER): Payer: Self-pay | Admitting: Physician Assistant

## 2018-06-05 VITALS — BP 133/87 | HR 66 | Temp 97.7°F | Ht 71.0 in | Wt 263.8 lb

## 2018-06-05 DIAGNOSIS — Z598 Other problems related to housing and economic circumstances: Secondary | ICD-10-CM

## 2018-06-05 DIAGNOSIS — E1169 Type 2 diabetes mellitus with other specified complication: Secondary | ICD-10-CM

## 2018-06-05 DIAGNOSIS — Z23 Encounter for immunization: Secondary | ICD-10-CM

## 2018-06-05 DIAGNOSIS — Z599 Problem related to housing and economic circumstances, unspecified: Secondary | ICD-10-CM

## 2018-06-05 DIAGNOSIS — R202 Paresthesia of skin: Secondary | ICD-10-CM

## 2018-06-05 NOTE — Patient Instructions (Signed)
Td Vaccine (Tetanus and Diphtheria): What You Need to Know 1. Why get vaccinated? Tetanus  and diphtheria are very serious diseases. They are rare in the [place] today, but people who do become infected often have severe complications. Td vaccine is used to protect adolescents and adults from both of these diseases. Both tetanus and diphtheria are infections caused by bacteria. Diphtheria spreads from person to person through coughing or sneezing. Tetanus-causing bacteria enter the body through cuts, scratches, or wounds. TETANUS (lockjaw) causes painful muscle tightening and stiffness, usually all over the body.  It can lead to tightening of muscles in the head and neck so you can't open your mouth, swallow, or sometimes even breathe. Tetanus kills about 1 out of every 10 people who are infected even after receiving the best medical care.  DIPHTHERIA can cause a thick coating to form in the back of the throat.  It can lead to breathing problems, paralysis, heart failure, and death.  Before vaccines, as many as 200,000 cases of diphtheria and hundreds of cases of tetanus were reported in the [place] each year. Since vaccination began, reports of cases for both diseases have dropped by about 99%. 2. Td vaccine Td vaccine can protect adolescents and adults from tetanus and diphtheria. Td is usually given as a booster dose every 10 years but it can also be given earlier after a severe and dirty wound or burn. Another vaccine, called Tdap, which protects against pertussis in addition to tetanus and diphtheria, is sometimes recommended instead of Td vaccine. Your doctor or the person giving you the vaccine can give you more information. Td may safely be given at the same time as other vaccines. 3. Some people should not get this vaccine  A person who has ever had a life-threatening allergic reaction after a previous dose of any tetanus or diphtheria containing vaccine, OR has a severe  allergy to any part of this vaccine, should not get Td vaccine. Tell the person giving the vaccine about any severe allergies.  Talk to your doctor if you: ? had severe pain or swelling after any vaccine containing diphtheria or tetanus, ? ever had a condition called Guillain Barre Syndrome (GBS), ? aren't feeling well on the day the shot is scheduled. 4. What are the risks from Td vaccine? With any medicine, including vaccines, there is a chance of side effects. These are usually mild and go away on their own. Serious reactions are also possible but are rare. Most people who get Td vaccine do not have any problems with it. Mild problems following Td vaccine: (Did not interfere with activities)  Pain where the shot was given (about 8 people in 10)  Redness or swelling where the shot was given (about 1 person in 4)  Mild fever (rare)  Headache (about 1 person in 4)  Tiredness (about 1 person in 4)  Moderate problems following Td vaccine: (Interfered with activities, but did not require medical attention)  Fever over 102F (rare)  Severe problems following Td vaccine: (Unable to perform usual activities; required medical attention)  Swelling, severe pain, bleeding and/or redness in the arm where the shot was given (rare).  Problems that could happen after any vaccine:  People sometimes faint after a medical procedure, including vaccination. Sitting or lying down for about 15 minutes can help prevent fainting, and injuries caused by a fall. Tell your doctor if you feel dizzy, or have vision changes or ringing in the ears.  Some people get   severe pain in the shoulder and have difficulty moving the arm where a shot was given. This happens very rarely.  Any medication can cause a severe allergic reaction. Such reactions from a vaccine are very rare, estimated at fewer than 1 in a million doses, and would happen within a few minutes to a few hours after the vaccination. As with any  medicine, there is a very remote chance of a vaccine causing a serious injury or death. The safety of vaccines is always being monitored. For more information, visit: www.cdc.gov/vaccinesafety/ 5. What if there is a serious reaction? What should I look for? Look for anything that concerns you, such as signs of a severe allergic reaction, very high fever, or unusual behavior. Signs of a severe allergic reaction can include hives, swelling of the face and throat, difficulty breathing, a fast heartbeat, dizziness, and weakness. These would usually start a few minutes to a few hours after the vaccination. What should I do?  If you think it is a severe allergic reaction or other emergency that can't wait, call 9-1-1 or get the person to the nearest hospital. Otherwise, call your doctor.  Afterward, the reaction should be reported to the Vaccine Adverse Event Reporting System (VAERS). Your doctor might file this report, or you can do it yourself through the VAERS web site at www.vaers.hhs.gov, or by calling 1-800-822-7967. ? VAERS does not give medical advice. 6. The National Vaccine Injury Compensation Program The National Vaccine Injury Compensation Program (VICP) is a federal program that was created to compensate people who may have been injured by certain vaccines. Persons who believe they may have been injured by a vaccine can learn about the program and about filing a claim by calling 1-800-338-2382 or visiting the VICP website at www.hrsa.gov/vaccinecompensation. There is a time limit to file a claim for compensation. 7. How can I learn more?  Ask your doctor. He or she can give you the vaccine package insert or suggest other sources of information.  Call your local or state health department.  Contact the Centers for Disease Control and Prevention (CDC): ? Call 1-800-232-4636 (1-800-CDC-INFO) ? Visit CDC's website at www.cdc.gov/vaccines CDC Td Vaccine VIS (11/02/15) This information is  not intended to replace advice given to you by your health care provider. Make sure you discuss any questions you have with your health care provider. Document Released: 05/07/2006 Document Revised: 03/30/2016 Document Reviewed: 03/30/2016 Elsevier Interactive Patient Education  2017 Elsevier Inc.  

## 2018-06-05 NOTE — Progress Notes (Addendum)
Subjective:  Patient ID: Spencer Robles, male    DOB: 07-10-1961  Age: 57 y.o. MRN: [id number]  CC: f/u paresthesia  HPI Spencer Robles a 57 y.o.malewith a medical history of DM2, OSA, onychomycosis, right hallux/right second toe amputation, epididymitis, scrotal abscess, and necrotizing fasciitis presents to f/u on paresthesias. 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 Pt has been unable to fill any of his medications due to lack of funds. He is unemployed and has applied to disability. Says he is fearful to obtain any employment that may affect his approval for disability. Relies on food stamps for his sustenance. Does not endorse any other symptoms or complaints.      Outpatient Medications Prior to Visit  Medication Sig Dispense Refill  . aspirin EC 81 MG tablet Take 1 tablet (81 mg total) by mouth daily. (Patient not taking: Reported on 06/05/2018) 90 tablet 3  . blood glucose meter kit and supplies KIT Dispense based on patient and insurance preference. Use up to four times daily as directed. (FOR ICD-9 250.00, 250.01). (Patient not taking: Reported on 06/05/2018) 1 each 0  . gabapentin (NEURONTIN) 300 MG capsule Take 2 capsules (600 mg total) by mouth 3 (three) times daily. (Patient not taking: Reported on 06/05/2018) 180 capsule 5  . ibuprofen (ADVIL,MOTRIN) 200 MG tablet Take 200 mg by mouth every 6 (six) hours as needed for mild pain.    . metFORMIN (GLUCOPHAGE) 500 MG tablet Take 2 tablets (1,000 mg total) by mouth 2 (two) times daily with a meal. (Patient not taking: Reported on 06/05/2018) 120 tablet 5  . predniSONE (DELTASONE) 50 MG tablet Take one tablet once daily x 5 days. (Patient not taking: Reported on 06/05/2018) 5 tablet 0  . sitaGLIPtin (JANUVIA) 100 MG tablet Take 1 tablet (100 mg total) by mouth daily. (Patient not taking: Reported on 06/05/2018) 30 tablet 5   No facility-administered medications prior to visit.      ROS Review of Systems   Constitutional: Negative for chills, fever and malaise/fatigue.  Eyes: Negative for blurred vision.  Respiratory: Negative for shortness of breath.   Cardiovascular: Negative for chest pain and palpitations.  Gastrointestinal: Negative for abdominal pain and nausea.  Genitourinary: Negative for dysuria and hematuria.  Musculoskeletal: Negative for joint pain and myalgias.  Skin: Negative for rash.  Neurological: Negative for tingling and headaches.  Psychiatric/Behavioral: Negative for depression. The patient is not nervous/anxious.     Objective:  BP 133/87 (BP Location: Left Arm, Patient Position: Sitting, Cuff Size: Large)   Pulse 66   Temp 97.7 F (36.5 C) (Oral)   Ht 5' 11" (1.803 m)   Wt 263 lb 12.8 oz (119.7 kg)   SpO2 98%   BMI 36.79 kg/m   BP/Weight 06/05/2018 05/22/2018 18/56/3149  Systolic BP 702 - 637  Diastolic BP 87 - 77  Wt. (Lbs) 263.8 250.4 250.4  BMI 36.79 34.92 34.92      Physical Exam  Constitutional: He is oriented to person, place, and time.  Well developed, overweight, NAD, polite  HENT:  Head: Normocephalic and atraumatic.  Eyes: No scleral icterus.  Neck: Normal range of motion. Neck supple. No thyromegaly present.  Cardiovascular: Normal rate, regular rhythm and normal heart sounds.  Pulmonary/Chest: Effort normal and breath sounds normal.  Musculoskeletal: He exhibits no edema.  Neurological: He is alert and oriented to person, place, and time.  Skin: Skin is warm and dry. No rash noted. No erythema. No pallor.  Psychiatric: He has a normal mood and  affect. His behavior is normal. Thought content normal.  Vitals reviewed.    Assessment & Plan:    1. Need for Tdap vaccination - Tdap vaccine greater than or equal to 7yo IM  2. Financial difficulties - Pt unable to obtain medications even with [facility] due to lack of funds.  - I have messaged Ms. Christa See, LCSW, to reach out and provide information/support in regards to  financial assistance, medication assistance, and possible employment.   3. Paresthesia - Pt unable to afford medications. Work with LCSW,  Continue Gabapentin at same dose when he is able to pick up at pharmacy.    Follow-up: Return in about 2 months (around 08/05/2018) for DM,  paresthesia.   Clent Demark PA

## 2018-06-14 ENCOUNTER — Ambulatory Visit: Payer: Self-pay | Attending: Family Medicine

## 2018-06-18 ENCOUNTER — Telehealth: Payer: Self-pay | Admitting: Licensed Clinical Social Worker

## 2018-06-18 NOTE — Telephone Encounter (Signed)
LCSWA placed call to patient to follow up on behavioral health screens from previous appointment. A message requesting a follow up call was left.

## 2018-06-24 ENCOUNTER — Telehealth: Payer: Self-pay | Admitting: Licensed Clinical Social Worker

## 2018-06-24 NOTE — Telephone Encounter (Signed)
Call placed to patient. LCSWA introduced self and explained role at [facility]. LCSWA informed pt of consult to address behavioral and/or psychosocial needs.   Pt scheduled to meet with LCSWA on 07/01/18 after appointment with Financial Counseling. No additional concerns were noted.

## 2018-06-26 ENCOUNTER — Encounter (INDEPENDENT_AMBULATORY_CARE_PROVIDER_SITE_OTHER): Payer: Self-pay | Admitting: Family

## 2018-06-26 ENCOUNTER — Ambulatory Visit (INDEPENDENT_AMBULATORY_CARE_PROVIDER_SITE_OTHER): Payer: Self-pay | Admitting: Family

## 2018-06-26 VITALS — Ht 71.0 in | Wt 263.8 lb

## 2018-06-26 DIAGNOSIS — M25532 Pain in left wrist: Secondary | ICD-10-CM

## 2018-06-26 DIAGNOSIS — R209 Unspecified disturbances of skin sensation: Secondary | ICD-10-CM

## 2018-06-26 DIAGNOSIS — IMO0001 Reserved for inherently not codable concepts without codable children: Secondary | ICD-10-CM

## 2018-06-26 DIAGNOSIS — G6289 Other specified polyneuropathies: Secondary | ICD-10-CM

## 2018-06-26 DIAGNOSIS — M25531 Pain in right wrist: Secondary | ICD-10-CM

## 2018-06-26 NOTE — Progress Notes (Signed)
Office Visit Note   Patient: Spencer Robles           Date of Birth: 10-02-1960           MRN: [id number] Visit Date: 06/26/2018              Requested by: Clent Demark, PA-C [place],  [id number] PCP: Clent Demark, PA-C  Chief Complaint  Patient presents with  . Right Leg - Pain  . Left Leg - Pain      HPI: Patient is a 57 year old gentleman who presents today in follow up for numbness and tingling to his bilateral lower extremities as well as upper extremities.  He is status post right great toe and second toe amputation December of last year.  As relates to this has no concerns.  Today complaining of numbness to bilateral plantar aspect of his feet this is been ongoing for quite some time and gradually worsening.  States numbness is now extending up the backs of his calfs and around the anterior compartments bilaterally some cramping associated with this.  Having anterior thigh numbness bilaterally.  No pain associated.  No aggravating or relieving factors.  No burning.  Complaining of bilateral hand numbness left worse than right.  This affects all fingers the palmar aspect as well as the dorsal aspect.  States on the right hand it began with just the fingertips and now extends all the way up to the wrist.  Denies any weakness no change in his grip strength.  No neck pain no injuries he can think of to upper extremities neck or back.  Overall very concerned that the numbness will affect his ability to work.  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  Denies dropping anything in the past as relates to the numbness.  Today relates when he raises his arms as he might to lift a box or even type on a keyboard this aggravates his symptoms.  Fears returning to work.  Fears if he had to type or lift things in the workplace this would aggravate his symptoms.  Feels he would not be able  to adequately perform his work duties.  Due to financial difficulties has not been taking his metformin.  Did get some from a friend but is taking this irregularly.  Unable to afford Neurontin or gabapentin has been unable to trial this.  Also was unable to afford prednisone.  Symptoms have worsened since last visit.  Assessment & Plan: Visit Diagnoses:  No diagnosis found.  Plan: Again discussed numbness to the feet discussed that this is likely diabetic neuropathy.  Does have arthritic changes in his neck.  We will proceed with MRI of the cervical spine concern for radiculopathy to upper extremities.  Follow-Up Instructions: No follow-ups on file.   Back Exam   Tenderness  The patient is experiencing no tenderness.   Range of Motion  The patient has normal back ROM.  Muscle Strength  The patient has normal back strength.  Tests  Straight leg raise right: negative Straight leg raise left: negative  Comments:  Ambulates with a cane.  Pain with chin to chest.  States numbness and tingling shooting down right upper extremity.  Negative Spurling.   Right Hand Exam   Tenderness  The patient is experiencing no tenderness.   Range of Motion  The patient has normal right wrist ROM.  Muscle Strength  Grip: 3/5   Tests  Tinel's sign (median nerve): negative Finkelstein's test: negative  Other  Erythema: absent Sensation: decreased   Left Hand Exam   Tenderness  The patient is experiencing no tenderness.   Range of Motion  The patient has normal left wrist ROM.  Muscle Strength  Grip:  4/5   Tests  Tinel's sign (median nerve): negative Finkelstein's test: negative  Other  Erythema: absent Sensation: decreased      Patient is alert, oriented, no adenopathy, well-dressed, normal affect, normal respiratory effort.   Imaging: No results found. No images are attached to the encounter.  Labs: Lab Results  Component Value Date   HGBA1C 6.0 (A)  05/08/2018   HGBA1C 7.6 (A) 01/08/2018   HGBA1C 8.1 (H) 09/03/2017   ESRSEDRATE 17 05/08/2018   ESRSEDRATE 70 (H) 07/03/2017   ESRSEDRATE 75 (H) 07/03/2017   CRP 2 05/08/2018   CRP 12.9 (H) 07/03/2017   CRP 14.2 (H) 07/03/2017   REPTSTATUS 08/31/2017 FINAL 08/30/2017   GRAMSTAIN  08/21/2017    RARE WBC PRESENT, PREDOMINANTLY PMN RARE GRAM POSITIVE COCCI IN CLUSTERS RARE GRAM VARIABLE ROD    CULT  08/30/2017    NO GROWTH Performed at [facility] [address], [place], [place] [address]    LABORGA METHICILLIN RESISTANT STAPHYLOCOCCUS AUREUS 08/21/2017     Lab Results  Component Value Date   ALBUMIN 4.7 05/08/2018   ALBUMIN 4.1 04/16/2018   ALBUMIN 3.9 02/19/2018   PREALBUMIN 8.5 (L) 07/03/2017    Body mass index is 36.79 kg/m.  Orders:  No orders of the defined types were placed in this encounter.  No orders of the defined types were placed in this encounter.    Procedures: No procedures performed  Clinical Data: No additional findings.  ROS:  All other systems negative, except as noted in the HPI. Review of Systems  Constitutional: Negative for chills and fever.  Cardiovascular: Negative for leg swelling.  Musculoskeletal: Positive for myalgias. Negative for arthralgias, back pain, gait problem, neck pain and neck stiffness.  Neurological: Positive for numbness. Negative for weakness.    Objective: Vital Signs: Ht 5\' 11"  (1.803 m)   Wt 263 lb 12.8 oz (119.7 kg)   BMI 36.79 kg/m   Specialty Comments:  No specialty comments available.  PMFS History: Patient Active Problem List   Diagnosis Date Noted  . Chronic pain of left knee 03/05/2018  . Scrotal abscess 08/30/2017  . Abscess 08/30/2017  . Epididymoorchitis 08/15/2017  . Great toe amputation status, right 07/24/2017  . Diabetic foot infection ([place])   . Subacute osteomyelitis, right ankle and foot ([place])   . Diabetic foot ([place]) 07/03/2017  . Diabetes mellitus type 2, uncomplicated  ([place]) 40/04/2724  . Prehypertension 04/15/2015   Past Medical History:  Diagnosis Date  . Diabetes mellitus without complication ([place])   . Epididymitis 08/15/2017  . Hx of necrotising fasciitis 06/2017   RLE/notes 08/15/2017  . Scrotal swelling    left; due to Epididymitis/notes 08/15/2017  . Sleep apnea    does not use a cpap    No family history on file.  Past Surgical History:  Procedure Laterality Date  . AMPUTATION TOE Right 07/06/2017   Procedure: RIGHT GREAT TOE AND SECOND TOE AMPUTATION;  Surgeon: Newt Minion, MD;  Location: [facility];  Service: Orthopedics;  Laterality: Right;  . CIRCUMCISION    . MASS BIOPSY  ~ 2008   scrotum/notes 08/15/2017  . SCROTAL EXPLORATION N/A  08/21/2017   Procedure: INCISION AND DRAINAGE SCROTAL ABSCESS, CYSTOSCOPY, COMPLEX INSERTION OF URETHERAL FOLEY CATHETER;  Surgeon: Raynelle Bring, MD;  Location: [facility];  Service: Urology;  Laterality: N/A;  . SCROTAL EXPLORATION N/A 08/30/2017   Procedure: SCROTUM DEBRIDEMENT;  Surgeon: Raynelle Bring, MD;  Location: [facility];  Service: Urology;  Laterality: N/A;   Social History   Occupational History  . Not on file  Tobacco Use  . Smoking status: Never Smoker  . Smokeless tobacco: Never Used  Substance and Sexual Activity  . Alcohol use: No    Alcohol/week: 0.0 standard drinks  . Drug use: No  . Sexual activity: Not on file

## 2018-07-01 ENCOUNTER — Ambulatory Visit (HOSPITAL_BASED_OUTPATIENT_CLINIC_OR_DEPARTMENT_OTHER): Payer: Self-pay | Admitting: Licensed Clinical Social Worker

## 2018-07-01 ENCOUNTER — Ambulatory Visit: Payer: Self-pay | Attending: Family Medicine

## 2018-07-01 DIAGNOSIS — F329 Major depressive disorder, single episode, unspecified: Secondary | ICD-10-CM

## 2018-07-01 DIAGNOSIS — F32A Depression, unspecified: Secondary | ICD-10-CM

## 2018-07-01 DIAGNOSIS — Z598 Other problems related to housing and economic circumstances: Secondary | ICD-10-CM

## 2018-07-01 DIAGNOSIS — Z599 Problem related to housing and economic circumstances, unspecified: Secondary | ICD-10-CM

## 2018-07-01 DIAGNOSIS — F419 Anxiety disorder, unspecified: Secondary | ICD-10-CM

## 2018-07-01 NOTE — BH Specialist Note (Signed)
Integrated Behavioral Health Initial Visit  MRN: [id number] Name: Spencer Robles  Number of [place] Clinician visits:: 1/6 Session Start time: 1:40 PM  Session End time: 2:10 PM Total time: 30 minutes  Type of Service: [place] Interpretor:No. Interpretor Name and Language: N/A   SUBJECTIVE: Spencer Robles is a 57 y.o. male accompanied by self Patient was referred by Dr. Altamease Oiler for community resources. Patient reports the following symptoms/concerns: Pt shared that he has ongoing medical conditions that result in chronic pain in pt's hands, arms, and feet. Pt is experiencing financial strain Duration of problem: 1 year; Severity of problem: moderate  OBJECTIVE: Mood: Anxious and Affect: Appropriate Risk of harm to self or others: No plan to harm self or others  LIFE CONTEXT: Family and Social: Pt receives emotional and financial support from ex-wife ([name]), pastor, and cousins Spencer Robles) School/Work: Pt is not employed at this time. He has a pending appeal for disability. He receives a voucher ($70) through his apartment complex and food stamps [phone]) Self-Care: Pt enjoys painting and fellowshiping with his church Life Changes: Pt shared that he has ongoing medical conditions that result in chronic pain in pt's hands, arms, and feet. Pt is experiencing financial strain  GOALS ADDRESSED: Patient will: 1. Reduce symptoms of: anxiety and stress 2. Increase knowledge and/or ability of: coping skills and healthy habits  3. Demonstrate ability to: Increase healthy adjustment to current life circumstances and Increase adequate support systems for patient/family  INTERVENTIONS: Interventions utilized: Supportive Counseling, Psychoeducation and/or Health Education and Link to [employer]  Standardized Assessments completed: Not Needed  ASSESSMENT: Patient currently experiencing anxiety and stress triggered by  ongoing medical conditions that result in chronic pain in pt's hands, arms, and feet. Pt is experiencing financial strain. He receives emotional and financial support within the community. Denies SI/HI/AVH.     Patient may benefit from community resources. LCSWA educated pt on the correlation between one's physical and mental health, in addition, to how stress can negatively impact health. Therapeutic interventions were decreased to assist with decreasing/managing symptoms. Pt was referred to Legal Aid and Vocational Rehab.  PLAN: 1. Follow up with behavioral health clinician on : Pt was encouraged to contact LCSWA if symptoms worsen or fail to improve to schedule behavioral appointments at [facility]. 2. Behavioral recommendations: LCSWA recommends that pt apply healthy coping skills discussed and follow up with referrals. Pt is encouraged to schedule follow up appointment with LCSWA 3. Referral(s): [facility] (In Clinic) and [employer] Resources:  Finances and Legal Aid 4. "From scale of 1-10, how likely are you to follow plan?":   Rebekah Chesterfield, LCSW 07/04/18 9:23 AM

## 2018-07-05 ENCOUNTER — Telehealth: Payer: Self-pay | Admitting: Licensed Clinical Social Worker

## 2018-07-05 NOTE — Telephone Encounter (Signed)
[place] faxed completed referral to Legal Aid

## 2018-07-08 ENCOUNTER — Ambulatory Visit (HOSPITAL_COMMUNITY)
Admission: RE | Admit: 2018-07-08 | Discharge: 2018-07-08 | Disposition: A | Discharge status: Home / Self Care | Payer: Medicaid Other | Source: Ambulatory Visit | Attending: Family | Admitting: Family

## 2018-07-08 DIAGNOSIS — M4802 Spinal stenosis, cervical region: Secondary | ICD-10-CM | POA: Insufficient documentation

## 2018-07-08 DIAGNOSIS — M25531 Pain in right wrist: Secondary | ICD-10-CM

## 2018-07-08 DIAGNOSIS — M25532 Pain in left wrist: Secondary | ICD-10-CM | POA: Insufficient documentation

## 2018-07-08 DIAGNOSIS — M50223 Other cervical disc displacement at C6-C7 level: Secondary | ICD-10-CM | POA: Insufficient documentation

## 2018-07-08 DIAGNOSIS — M47812 Spondylosis without myelopathy or radiculopathy, cervical region: Secondary | ICD-10-CM | POA: Diagnosis not present

## 2018-08-07 ENCOUNTER — Other Ambulatory Visit: Payer: Self-pay

## 2018-08-07 ENCOUNTER — Encounter (INDEPENDENT_AMBULATORY_CARE_PROVIDER_SITE_OTHER): Payer: Self-pay | Admitting: Nurse Practitioner

## 2018-08-07 ENCOUNTER — Ambulatory Visit (INDEPENDENT_AMBULATORY_CARE_PROVIDER_SITE_OTHER): Payer: Self-pay | Admitting: Nurse Practitioner

## 2018-08-07 VITALS — BP 115/69 | HR 64 | Temp 98.1°F | Ht 71.0 in | Wt 267.8 lb

## 2018-08-07 DIAGNOSIS — R202 Paresthesia of skin: Secondary | ICD-10-CM

## 2018-08-07 DIAGNOSIS — E1142 Type 2 diabetes mellitus with diabetic polyneuropathy: Secondary | ICD-10-CM

## 2018-08-07 DIAGNOSIS — E11628 Type 2 diabetes mellitus with other skin complications: Secondary | ICD-10-CM

## 2018-08-07 NOTE — Progress Notes (Signed)
Assessment & Plan:  Spencer Robles was seen today for follow-up.  Diagnoses and all orders for this visit:  Paresthesias Follow up with Ortho as instructed.   Type 2 diabetes mellitus with other skin complication, without long-term current use of insulin ([place]) -     Ambulatory referral to Ophthalmology -     Lipid panel Continue blood sugar control as discussed in office today, low carbohydrate diet, and regular physical exercise as tolerated, 150 minutes per week (30 min each day, 5 days per week, or 50 min 3 days per week). Keep blood sugar logs with fasting goal of 90-130 mg/dl, post prandial (after you eat) less than 180.  For Hypoglycemia: BS <60 and Hyperglycemia BS >400; contact the clinic ASAP. Annual eye exams and foot exams are recommended.   Patient has been counseled on age-appropriate routine health concerns for screening and prevention. These are reviewed and up-to-date. Referrals have been placed accordingly. Immunizations are up-to-date or declined.    Subjective:   Chief Complaint  Patient presents with  . Follow-up    DM/paraesthesia   HPI Spencer Robles 58 y.o. male presents to office today for follow up to DM and HTN. Today is my first time seeing him in this office. He has a history of DM2, OSA,  onychomycosis, right hallux/right second toe amputation (06-2018), epididymitis, scrotal abscess, and necrotizing fasciitis. Currently being evaluated by Ortho for numbness and tingling of his BLE and BUE. 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 Pt has been unable to fill any of his medications due to lack of funds. He is unemployed and has applied to disability. Says he is fearful to obtain any employment that may affect his approval for disability. Relies on food stamps for his sustenance. Does not endorse any other symptoms or complaints.  He was seen in December 2019 by Ortho and at that time he endorsed not taking metformin, Neurontin, or the prednisone that had  been prescribed for him due to financial difficulties. MRI was ordered of the cervical spine and patient was instructed to follow up in 4 weeks. Lab results were negative for any autoimmune or inflammatory conditions. He denies any involuntary loss of bowel or bladder.   Today he states he is still awaiting disability. Getting his metformin and ibuprofen from family members. I have instructed him to make sure he applies for financial assistance program. He has not followed up with orthopedics.  MRI 06-2018 1. No acute osseous or cord signal abnormality. 2. Cervical spondylosis greatest at the C6-7 level. 3. 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 Multilevel mild spinal canal stenosis. 4. Severe left C4-5 and severe right C6-7 foraminal stenosis. Multilevel mild and moderate foraminal stenosis.   DM TYPE 2 Chronic. Well controlled. He denies any hypoglycemic symptoms. Overdue for eye exam.  Lab Results  Component Value Date   HGBA1C 6.0 (A) 05/08/2018    Review of Systems  Constitutional: Negative for fever, malaise/fatigue and weight loss.  HENT: Negative.  Negative for nosebleeds.   Eyes: Negative.  Negative for blurred vision, double vision and photophobia.  Respiratory: Negative.  Negative for cough and shortness of breath.   Cardiovascular: Negative.  Negative for chest pain, palpitations and leg swelling.  Gastrointestinal: Negative.  Negative for heartburn, nausea and vomiting.  Musculoskeletal: Positive for neck pain. Negative for myalgias.  Neurological: Positive for tingling and sensory change. Negative for dizziness, focal weakness, seizures and headaches.  Psychiatric/Behavioral: Negative.  Negative for suicidal  ideas.    Past Medical History:  Diagnosis Date  . Diabetes mellitus without complication ([place])   . Epididymitis 08/15/2017  . Hx of necrotising fasciitis 06/2017   RLE/notes 08/15/2017  . Scrotal swelling     left; due to Epididymitis/notes 08/15/2017  . Sleep apnea    does not use a cpap    Past Surgical History:  Procedure Laterality Date  . AMPUTATION TOE Right 07/06/2017   Procedure: RIGHT GREAT TOE AND SECOND TOE AMPUTATION;  Surgeon: Newt Minion, MD;  Location: [facility];  Service: Orthopedics;  Laterality: Right;  . CIRCUMCISION    . MASS BIOPSY  ~ 2008   scrotum/notes 08/15/2017  . SCROTAL EXPLORATION N/A 08/21/2017   Procedure: INCISION AND DRAINAGE SCROTAL ABSCESS, CYSTOSCOPY, COMPLEX INSERTION OF URETHERAL FOLEY CATHETER;  Surgeon: Raynelle Bring, MD;  Location: [facility];  Service: Urology;  Laterality: N/A;  . SCROTAL EXPLORATION N/A 08/30/2017   Procedure: SCROTUM DEBRIDEMENT;  Surgeon: Raynelle Bring, MD;  [facility];  Service: Urology;  Laterality: N/A;    History reviewed. No pertinent family history.  Social History Reviewed with no changes to be made today.   Outpatient Medications Prior to Visit  Medication Sig Dispense Refill  . gabapentin (NEURONTIN) 300 MG capsule Take 2 capsules (600 mg total) by mouth 3 (three) times daily. 180 capsule 5  . ibuprofen (ADVIL,MOTRIN) 200 MG tablet Take 200 mg by mouth every 6 (six) hours as needed for mild pain.    Marland Kitchen aspirin EC 81 MG tablet Take 1 tablet (81 mg total) by mouth daily. (Patient not taking: Reported on 08/07/2018) 90 tablet 3  . blood glucose meter kit and supplies KIT Dispense based on patient and insurance preference. Use up to four times daily as directed. (FOR ICD-9 250.00, 250.01). (Patient not taking: Reported on 08/07/2018) 1 each 0  . metFORMIN (GLUCOPHAGE) 500 MG tablet Take 2 tablets (1,000 mg total) by mouth 2 (two) times daily with a meal. 120 tablet 5  . sitaGLIPtin (JANUVIA) 100 MG tablet Take 1 tablet (100 mg total) by mouth daily. (Patient not taking: Reported on 08/07/2018) 30 tablet 5  . predniSONE (DELTASONE) 50 MG tablet Take one tablet once daily x 5 days. 5 tablet 0   No facility-administered medications  prior to visit.     No Known Allergies     Objective:    BP 115/69 (BP Location: Left Arm, Patient Position: Sitting, Cuff Size: Large)   Pulse 64   Temp 98.1 F (36.7 C) (Oral)   Ht 5' 11"  (1.803 m)   Wt 267 lb 12.8 oz (121.5 kg)   SpO2 97%   BMI 37.35 kg/m  Wt Readings from Last 3 Encounters:  08/07/18 267 lb 12.8 oz (121.5 kg)  06/26/18 263 lb 12.8 oz (119.7 kg)  06/05/18 263 lb 12.8 oz (119.7 kg)    Physical Exam Vitals signs and nursing note reviewed.  Constitutional:      Appearance: He is well-developed.  HENT:     Head: Normocephalic and atraumatic.  Neck:     Musculoskeletal: Normal range of motion.  Cardiovascular:     Rate and Rhythm: Normal rate and regular rhythm.     Heart sounds: Normal heart sounds. No murmur. No friction rub. No gallop.   Pulmonary:     Effort: Pulmonary effort is normal. No tachypnea or respiratory distress.     Breath sounds: Normal breath sounds. No decreased breath sounds, wheezing, rhonchi or rales.  Chest:  Chest wall: No tenderness.  Abdominal:     General: Bowel sounds are normal.     Palpations: Abdomen is soft.  Musculoskeletal: Normal range of motion.  Skin:    General: Skin is warm and dry.  Neurological:     Mental Status: He is alert and oriented to person, place, and time.     Coordination: Coordination normal.     Gait: Gait abnormal (using cane).  Psychiatric:        Behavior: Behavior normal. Behavior is cooperative.        Thought Content: Thought content normal.        Judgment: Judgment normal.        Patient has been counseled extensively about nutrition and exercise as well as the importance of adherence with medications and regular follow-up. The patient was given clear instructions to go to ER or return to medical center if symptoms don't improve, worsen or new problems develop. The patient verbalized understanding.   Follow-up: Return in about 3 months (around 11/06/2018) for A1c/DM/lipids.    Gildardo Pounds, FNP-BC [facility] and [place], [place]   08/07/2018, 11:44 AM

## 2018-08-08 LAB — LIPID PANEL
CHOL/HDL RATIO: 4.1 ratio (ref 0.0–5.0)
Cholesterol, Total: 161 mg/dL (ref 100–199)
HDL: 39 mg/dL — ABNORMAL LOW (ref >39)
LDL Calculated: 88 mg/dL (ref 0–99)
Triglycerides: 170 mg/dL — ABNORMAL HIGH (ref 0–149)
VLDL Cholesterol Cal: 34 mg/dL (ref 5–40)

## 2018-09-18 ENCOUNTER — Telehealth (INDEPENDENT_AMBULATORY_CARE_PROVIDER_SITE_OTHER): Payer: Self-pay | Admitting: Orthopedic Surgery

## 2018-09-18 NOTE — Telephone Encounter (Signed)
Called pt left VM about scheduling an apt with Junie Panning

## 2018-09-18 NOTE — Telephone Encounter (Signed)
Do you want to hold onto this message to follow up with pt one more time should he not call in a few days?

## 2018-09-18 NOTE — Telephone Encounter (Signed)
Pt made for 3/4 with Erin at 10:30

## 2018-09-25 ENCOUNTER — Encounter (INDEPENDENT_AMBULATORY_CARE_PROVIDER_SITE_OTHER): Payer: Self-pay | Admitting: Family

## 2018-09-25 ENCOUNTER — Ambulatory Visit (INDEPENDENT_AMBULATORY_CARE_PROVIDER_SITE_OTHER): Payer: Self-pay | Admitting: Family

## 2018-09-25 ENCOUNTER — Telehealth (INDEPENDENT_AMBULATORY_CARE_PROVIDER_SITE_OTHER): Payer: Self-pay | Admitting: Orthopedic Surgery

## 2018-09-25 VITALS — Ht 71.0 in | Wt 267.8 lb

## 2018-09-25 DIAGNOSIS — G8929 Other chronic pain: Secondary | ICD-10-CM

## 2018-09-25 DIAGNOSIS — M542 Cervicalgia: Secondary | ICD-10-CM

## 2018-09-25 DIAGNOSIS — M4302 Spondylolysis, cervical region: Secondary | ICD-10-CM

## 2018-09-25 DIAGNOSIS — R531 Weakness: Secondary | ICD-10-CM

## 2018-09-25 DIAGNOSIS — R202 Paresthesia of skin: Secondary | ICD-10-CM

## 2018-09-25 NOTE — Telephone Encounter (Signed)
I called patient,advised him the [employer] form has been completed and he asked that it be be mailed to him. I made copy and placed in mail to him [place], [place]  [id number]

## 2018-09-25 NOTE — Progress Notes (Signed)
Office Visit Note   Patient: Spencer Robles           Date of Birth: [birth year]           MRN: [id number] Visit Date: 09/25/2018              Requested by: Clent Demark, PA-C No address on file PCP: Clent Demark, PA-C  Chief Complaint  Patient presents with  . Neck - Follow-up    MRI Review & Scat paperwork      HPI: Patient is a 58 year old gentleman who presents today in follow up for numbness and tingling to his bilateral lower extremities as well as upper extremities.  Also here for MRI review of the cervical spine.  He is status post right great toe and second toe amputation December of last year.  As relates to this has no concerns.  Complaining of bilateral hand numbness left worse than right.  Feels the pain is worse in his left hand than his right.  However feels the right is gradually worsening.  This affects all fingers the palmar aspect as well as the dorsal aspect.  States on the right hand it began with just the fingertips and now extends all the way up to the wrist.  Some subjective weakness, overall feels weaker however states no change in his grip strength.  Mild neck pain.  No recent injuries he can think of to upper extremities neck or back.  Overall very concerned that the numbness will affect his ability to work.  Is working on disability paperwork.  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  Denies dropping anything in the past as relates to the numbness.  Today relates when he raises his arms as he might to lift a box or even type on a keyboard this aggravates his symptoms.  Fears returning to work.  Fears if he had to type or lift things in the workplace this would aggravate his symptoms.  Feels he would not be able to adequately perform his work duties.  Due to financial difficulties has not been taking his metformin.  Did get some from a friend but is taking this  irregularly.  Unable to afford Neurontin or gabapentin has been unable to trial this.  Also was unable to afford prednisone.  Interested in a referral to neurosurgery however states is unable to afford any medications.  Assessment & Plan: Visit Diagnoses:  1. Paresthesias with subjective weakness   2. Chronic neck pain   3. Spondylolysis, cervical region     Plan: Again discussed numbness to the feet discussed that this is likely diabetic neuropathy.  Discussed possibility of neuropathic pain related to diabetes in his hands as well.  However he does have spondylosis as well as spinal canal stenosis.  Will refer to neurosurgery for evaluation and their input.    Follow-Up Instructions: Return if symptoms worsen or fail to improve.   Back Exam   Tenderness  The patient is experiencing no tenderness.   Range of Motion  The patient has normal back ROM.  Muscle Strength  The patient has normal back strength.  Tests  Straight leg raise right: negative Straight leg raise left: negative  Comments:  Ambulates with a cane.  Pain with chin to chest.  States numbness and tingling shooting down right upper extremity.  Negative Spurling.   Right Hand Exam   Tenderness  The patient is experiencing no tenderness.   Range of Motion  The patient has normal right wrist ROM.   Muscle Strength  Grip: 4/5   Tests  Phalen's sign: positive Tinel's sign (median nerve): negative Finkelstein's test: negative  Other  Erythema: absent Sensation: decreased   Left Hand Exam   Tenderness  The patient is experiencing no tenderness.   Range of Motion  The patient has normal left wrist ROM.  Muscle Strength  Grip:  4/5   Tests  Tinel's sign (median nerve): negative Finkelstein's test: negative  Other  Erythema: absent Sensation: decreased      Patient is alert, oriented, no adenopathy, well-dressed, normal affect, normal respiratory effort.   Imaging: No results  found. No images are attached to the encounter.  Labs: Lab Results  Component Value Date   HGBA1C 6.0 (A) 05/08/2018   HGBA1C 7.6 (A) 01/08/2018   HGBA1C 8.1 (H) 09/03/2017   ESRSEDRATE 17 05/08/2018   ESRSEDRATE 70 (H) 07/03/2017   ESRSEDRATE 75 (H) 07/03/2017   CRP 2 05/08/2018   CRP 12.9 (H) 07/03/2017   CRP 14.2 (H) 07/03/2017   REPTSTATUS 08/31/2017 FINAL 08/30/2017   GRAMSTAIN  08/21/2017    RARE WBC PRESENT, PREDOMINANTLY PMN RARE GRAM POSITIVE COCCI IN CLUSTERS RARE GRAM VARIABLE ROD    CULT  08/30/2017    NO GROWTH Performed at [facility] [address] [place], [place] [address]    LABORGA METHICILLIN RESISTANT STAPHYLOCOCCUS AUREUS 08/21/2017     Lab Results  Component Value Date   ALBUMIN 4.7 05/08/2018   ALBUMIN 4.1 04/16/2018   ALBUMIN 3.9 02/19/2018   PREALBUMIN 8.5 (L) 07/03/2017    Body mass index is 37.35 kg/m.  Orders:  No orders of the defined types were placed in this encounter.  No orders of the defined types were placed in this encounter.    Procedures: No procedures performed  Clinical Data: No additional findings.  ROS:  All other systems negative, except as noted in the HPI. Review of Systems  Constitutional: Negative for chills and fever.  Cardiovascular: Negative for leg swelling.  Musculoskeletal: Positive for myalgias. Negative for arthralgias, back pain, gait problem, neck pain and neck stiffness.  Neurological: Positive for numbness. Negative for weakness.    Objective: Vital Signs: Ht 5\' 11"  (1.803 m)   Wt 267 lb 12.8 oz (121.5 kg)   BMI 37.35 kg/m   Specialty Comments:  No specialty comments available.  PMFS History: Patient Active Problem List   Diagnosis Date Noted  . Chronic pain of left knee 03/05/2018  . Scrotal abscess 08/30/2017  . Abscess 08/30/2017  . Epididymoorchitis 08/15/2017  . Great toe amputation status, right 07/24/2017  . Diabetic foot infection ([place])   . Subacute  osteomyelitis, right ankle and foot ([place])   . Diabetic foot ([place]) 07/03/2017  . Diabetes mellitus type 2, uncomplicated ([facility]) [birth year]/1941  . Prehypertension 04/15/2015   Past Medical History:  Diagnosis Date  . Diabetes mellitus without complication ([place])   . Epididymitis 08/15/2017  . Hx of necrotising fasciitis 06/2017   RLE/notes 08/15/2017  . Scrotal swelling    left; due to Epididymitis/notes 08/15/2017  . Sleep apnea    does not use a cpap    History reviewed. No pertinent family history.  Past Surgical History:  Procedure Laterality Date  . AMPUTATION TOE Right 07/06/2017   Procedure: RIGHT GREAT TOE AND SECOND TOE AMPUTATION;  Surgeon: Newt Minion, MD;  Location: [facility];  Service:  Orthopedics;  Laterality: Right;  . CIRCUMCISION    . MASS BIOPSY  ~ 2008   scrotum/notes 08/15/2017  . SCROTAL EXPLORATION N/A 08/21/2017   Procedure: INCISION AND DRAINAGE SCROTAL ABSCESS, CYSTOSCOPY, COMPLEX INSERTION OF URETHERAL FOLEY CATHETER;  Surgeon: Raynelle Bring, MD;  Location: [facility];  Service: Urology;  Laterality: N/A;  . SCROTAL EXPLORATION N/A 08/30/2017   Procedure: SCROTUM DEBRIDEMENT;  Surgeon: Raynelle Bring, MD;  Location: [facility];  Service: Urology;  Laterality: N/A;   Social History   Occupational History  . Not on file  Tobacco Use  . Smoking status: Never Smoker  . Smokeless tobacco: Never Used  Substance and Sexual Activity  . Alcohol use: No    Alcohol/week: 0.0 standard drinks  . Drug use: No  . Sexual activity: Not on file

## 2018-10-22 DIAGNOSIS — E119 Type 2 diabetes mellitus without complications: Secondary | ICD-10-CM

## 2018-11-06 ENCOUNTER — Ambulatory Visit (INDEPENDENT_AMBULATORY_CARE_PROVIDER_SITE_OTHER): Payer: Self-pay | Admitting: Primary Care

## 2018-11-06 ENCOUNTER — Ambulatory Visit: Payer: Self-pay | Attending: Primary Care | Admitting: Primary Care

## 2018-11-06 ENCOUNTER — Encounter: Payer: Self-pay | Admitting: Primary Care

## 2018-11-06 ENCOUNTER — Other Ambulatory Visit: Payer: Self-pay

## 2018-11-06 VITALS — BP 140/90 | HR 65 | Temp 98.5°F | Ht 71.0 in | Wt 277.8 lb

## 2018-11-06 DIAGNOSIS — Z760 Encounter for issue of repeat prescription: Secondary | ICD-10-CM

## 2018-11-06 DIAGNOSIS — Z794 Long term (current) use of insulin: Secondary | ICD-10-CM

## 2018-11-06 DIAGNOSIS — Z1211 Encounter for screening for malignant neoplasm of colon: Secondary | ICD-10-CM

## 2018-11-06 DIAGNOSIS — E119 Type 2 diabetes mellitus without complications: Secondary | ICD-10-CM

## 2018-11-06 LAB — POCT GLYCOSYLATED HEMOGLOBIN (HGB A1C): Hemoglobin A1C: 13 % — AB (ref 4.0–5.6)

## 2018-11-06 LAB — GLUCOSE, POCT (MANUAL RESULT ENTRY)
POC Glucose: 325 mg/dl — AB (ref 70–99)
POC Glucose: 293 mg/dl — AB (ref 70–99)

## 2018-11-06 MED ORDER — INSULIN GLARGINE 100 UNIT/ML SOLOSTAR PEN: 10.0000 [IU] | PEN_INJECTOR | Freq: Every day | SUBCUTANEOUS | 99 refills | Status: DC

## 2018-11-06 MED ORDER — INSULIN ASPART 100 UNIT/ML ~~LOC~~ SOLN
15.0000 [IU] | Freq: Once | SUBCUTANEOUS | Status: AC
  Administered 2018-11-06: 11:00:00 15 [IU] via SUBCUTANEOUS

## 2018-11-06 MED ORDER — METFORMIN HCL 500 MG PO TABS: 1000.0000 mg | ORAL_TABLET | Freq: Two times a day (BID) | ORAL | 0 refills | Status: DC

## 2018-11-06 MED ORDER — SITAGLIPTIN PHOSPHATE 100 MG PO TABS: 100.0000 mg | ORAL_TABLET | Freq: Every day | ORAL | 5 refills | Status: DC

## 2018-11-06 MED FILL — metFORMIN HCL 500 MG TABS: 500 | 30 days supply | Qty: 120 | Fill #0

## 2018-11-06 MED FILL — $JANUVIA 100 MG TABLET: 100 | 90 days supply | Qty: 90 | Fill #0

## 2018-11-06 MED FILL — !LANTUS SOLOSTAR 100UNITS/M: 100 | 30 days supply | Qty: 3 | Fill #0

## 2018-11-06 NOTE — Progress Notes (Signed)
Here today to check A1c,   Med refills

## 2018-11-06 NOTE — Progress Notes (Signed)
Established Patient Office Visit  Subjective:  Patient ID: Spencer Robles, male    DOB: [birth year]  Age: 58 y.o. MRN: [id number]  CC:  Chief Complaint  Patient presents with  . Diabetes  . Medication Refill    Diabetes  He presents for his follow-up diabetic visit. He has type 2 diabetes mellitus. No MedicAlert identification noted. His disease course has been worsening. There are no hypoglycemic associated symptoms. Associated symptoms include fatigue, foot paresthesias, polyuria and visual change. There are no hypoglycemic complications. Symptoms are worsening. Diabetic complications include nephropathy. Risk factors for coronary artery disease include male sex, obesity, hypertension, dyslipidemia and diabetes mellitus. Current diabetic treatment includes oral agent (dual therapy). He is compliant with treatment most of the time. His weight is increasing steadily. Meal planning includes avoidance of concentrated sweets. He has not had a previous visit with a dietitian. He rarely (since COVID-19) participates in exercise. His home blood glucose trend is increasing steadily. His breakfast blood glucose range is generally >200 mg/dl. His lunch blood glucose range is generally >200 mg/dl. His dinner blood glucose range is generally >200 mg/dl. His bedtime blood glucose range is generally >200 mg/dl. His overall blood glucose range is >200 mg/dl. He does not see a podiatrist.Eye exam is not current.  Medication Refill  Associated symptoms include fatigue and a visual change.     Past Medical History:  Diagnosis Date  . Diabetes mellitus without complication ([place])   . Epididymitis 08/15/2017  . Hx of necrotising fasciitis 06/2017   RLE/notes 08/15/2017  . Scrotal swelling    left; due to Epididymitis/notes 08/15/2017  . Sleep apnea    does not use a cpap    Past Surgical History:  Procedure Laterality Date  . AMPUTATION TOE Right 07/06/2017   Procedure: RIGHT GREAT TOE AND SECOND TOE  AMPUTATION;  Surgeon: Newt Minion, MD;  Location: [facility];  Service: Orthopedics;  Laterality: Right;  . CIRCUMCISION    . MASS BIOPSY  ~ 2008   scrotum/notes 08/15/2017  . SCROTAL EXPLORATION N/A 08/21/2017   Procedure: INCISION AND DRAINAGE SCROTAL ABSCESS, CYSTOSCOPY, COMPLEX INSERTION OF URETHERAL FOLEY CATHETER;  Surgeon: Raynelle Bring, MD;  Location: [facility];  Service: Urology;  Laterality: N/A;  . SCROTAL EXPLORATION N/A 08/30/2017   Procedure: SCROTUM DEBRIDEMENT;  Surgeon: Raynelle Bring, MD;  Location[facility];  Service: Urology;  Laterality: N/A;    History reviewed. No pertinent family history.  Social History   Socioeconomic History  . Marital status: Married    Spouse name: Not on file  . Number of children: Not on file  . Years of education: Not on file  . Highest education level: Not on file  Occupational History  . Not on file  Social Needs  . Financial resource strain: Not on file  . Food insecurity:    Worry: Not on file    Inability: Not on file  . Transportation needs:    Medical: Not on file    Non-medical: Not on file  Tobacco Use  . Smoking status: Never Smoker  . Smokeless tobacco: Never Used  Substance and Sexual Activity  . Alcohol use: No    Alcohol/week: 0.0 standard drinks  . Drug use: No  . Sexual activity: Not on file  Lifestyle  . Physical activity:    Days per week: Not on file    Minutes per session: Not on file  . Stress: Not on file  Relationships  . Social connections:  Talks on phone: Not on file    Gets together: Not on file    Attends religious service: Not on file    Active member of club or organization: Not on file    Attends meetings of clubs or organizations: Not on file    Relationship status: Not on file  . Intimate partner violence:    Fear of current or ex partner: Not on file    Emotionally abused: Not on file    Physically abused: Not on file    Forced sexual activity: Not on file  Other Topics Concern  . Not on  file  Social History Narrative   ** Merged History Encounter **        Outpatient Medications Prior to Visit  Medication Sig Dispense Refill  . metFORMIN (GLUCOPHAGE) 500 MG tablet Take 2 tablets (1,000 mg total) by mouth 2 (two) times daily with a meal. 120 tablet 5  . aspirin EC 81 MG tablet Take 1 tablet (81 mg total) by mouth daily. (Patient not taking: Reported on 11/06/2018) 90 tablet 3  . blood glucose meter kit and supplies KIT Dispense based on patient and insurance preference. Use up to four times daily as directed. (FOR ICD-9 250.00, 250.01). (Patient not taking: Reported on 11/06/2018) 1 each 0  . gabapentin (NEURONTIN) 300 MG capsule Take 2 capsules (600 mg total) by mouth 3 (three) times daily. (Patient not taking: Reported on 11/06/2018) 180 capsule 5  . ibuprofen (ADVIL,MOTRIN) 200 MG tablet Take 200 mg by mouth every 6 (six) hours as needed for mild pain.    . sitaGLIPtin (JANUVIA) 100 MG tablet Take 1 tablet (100 mg total) by mouth daily. (Patient not taking: Reported on 11/06/2018) 30 tablet 5   No facility-administered medications prior to visit.     No Known Allergies  ROS Review of Systems  Constitutional: Positive for fatigue.  Endocrine: Positive for polyuria.      Objective:    Physical Exam  BP 140/90 (BP Location: Left Arm, Patient Position: Sitting, Cuff Size: Large)   Pulse 65   Temp 98.5 F (36.9 C) (Oral)   Ht _0  (1.803 m)   Wt 277 lb 12.8 oz (126 kg)   SpO2 97%   BMI 38.75 kg/m  Wt Readings from Last 3 Encounters:  11/06/18 277 lb 12.8 oz (126 kg)  09/25/18 267 lb 12.8 oz (121.5 kg)  08/07/18 267 lb 12.8 oz (121.5 kg)     Health Maintenance Due  Topic Date Due  . OPHTHALMOLOGY EXAM  02/08/1969  . Fecal DNA (Cologuard)  02/08/2009    There are no preventive care reminders to display for this patient.  No results found for: TSH Lab Results  Component Value Date   WBC 11.8 (H) 04/16/2018   HGB 12.4 (L) 04/16/2018   HCT 38.1 (L)  04/16/2018   MCV 87.6 04/16/2018   PLT 151 04/16/2018   Lab Results  Component Value Date   NA 141 05/08/2018   K 4.0 05/08/2018   CO2 24 05/08/2018   GLUCOSE 123 (H) 05/08/2018   BUN 10 05/08/2018   CREATININE 0.87 05/08/2018   BILITOT 0.4 05/08/2018   ALKPHOS 95 05/08/2018   AST 16 05/08/2018   ALT 18 05/08/2018   PROT 7.7 05/08/2018   ALBUMIN 4.7 05/08/2018   CALCIUM 10.1 05/08/2018   ANIONGAP 10 04/16/2018   Lab Results  Component Value Date   CHOL 161 08/07/2018   Lab Results  Component Value Date  HDL 39 (L) 08/07/2018   Lab Results  Component Value Date   LDLCALC 88 08/07/2018   Lab Results  Component Value Date   TRIG 170 (H) 08/07/2018   Lab Results  Component Value Date   CHOLHDL 4.1 08/07/2018   Lab Results  Component Value Date   HGBA1C 13.0 (A) 11/06/2018      Spencer Robles was seen today for diabetes and medication refill.  Assessment/Plan  Diagnoses and all orders for this visit:    Glucose (CBG) -     HgB A1c -     insulin aspart (novoLOG) injection 15 Units -     Glucose (CBG) -     Ambulatory referral to Ophthalmology  Type 2 diabetes mellitus without complication, without long-term current use of insulin (HCC) -     Glucose (CBG) -     HgB A1c -     insulin aspart (novoLOG) injection 15 Units -     Glucose (CBG) -     Ambulatory referral to Ophthalmology  Colon cancer screening -     Fecal occult blood, imunochemical; Future  Medication refill -     metFORMIN (GLUCOPHAGE) 500 MG tablet; Take 2 tablets (1,000 mg total) by mouth 2 (two) times daily with a meal. Added Lantus 10 units Qhs  Type 2 diabetes mellitus without complication, with long-term current use of insulin (HCC)Diabetes     Glucose (CBG) -     HgB A1c 13.1  -     insulin aspart (novoLOG) injection 15 Units -     Glucose (CBG) -     Ambulatory referral to Ophthalmology   He presents for his follow-up diabetic visit. He has type 2 diabetes mellitus. No MedicAlert  identification noted. His disease course has been worsening. There are no hypoglycemic associated symptoms. Associated symptoms include fatigue, foot paresthesias, polyuria and visual change. There are no hypoglycemic complications. Symptoms are worsening. Diabetic complications include nephropathy. Risk factors for coronary artery disease include male sex, obesity, hypertension, dyslipidemia and diabetes mellitus. Current diabetic treatment includes oral agent (dual therapy). He is compliant with treatment most of the time. His weight is increasing steadily. Meal planning includes avoidance of concentrated sweets. He has not had a previous visit with a dietitian. He rarely (since COVID-19) participates in exercise. His home blood glucose trend is increasing steadily. His breakfast blood glucose range is generally >200 mg/dl. His lunch blood glucose range is generally >200 mg/dl. His dinner blood glucose range is generally >200 mg/dl. His bedtime blood glucose range is generally >200 mg/dl. His overall blood glucose range is >200 mg/dl. He does not see a podiatrist.Eye exam is not current.  -     metFORMIN (GLUCOPHAGE) 500 MG tablet; Take 2 tablets (1,000 mg total) by mouth 2 (two) times daily with a meal. -     sitaGLIPtin (JANUVIA) 100 MG tablet; Take 1 tablet (100 mg total) by mouth daily.  Other orders -     Insulin Glargine (LANTUS SOLOSTAR) 100 UNIT/ML Solostar Pen; Inject 10 Units into the skin daily.     Meds ordered this encounter  Medications  . insulin aspart (novoLOG) injection 15 Units    Follow-up: No follow-ups on file.    Kerin Perna, NP

## 2018-11-20 ENCOUNTER — Other Ambulatory Visit: Payer: Self-pay

## 2018-11-20 ENCOUNTER — Ambulatory Visit: Payer: Self-pay | Attending: Family Medicine | Admitting: Pharmacist

## 2018-11-20 DIAGNOSIS — E119 Type 2 diabetes mellitus without complications: Secondary | ICD-10-CM

## 2018-11-20 LAB — GLUCOSE, POCT (MANUAL RESULT ENTRY): POC Glucose: 128 mg/dl — AB (ref 70–99)

## 2018-11-20 MED ORDER — ATORVASTATIN CALCIUM 20 MG PO TABS: 20.0000 mg | ORAL_TABLET | Freq: Every day | ORAL | 2 refills | Status: DC

## 2018-11-20 MED FILL — ATORVASTATIN 20 MG TABLET: 20 | 30 days supply | Qty: 30 | Fill #0

## 2018-11-20 NOTE — Progress Notes (Signed)
    S:    PCP: Juluis Mire   No chief complaint on file.  Patient arrives in good spirits. Presents for diabetes evaluation, education, and management at the request of Sharyn Lull. Patient was referred on 11/06/18.  Refills were provided for the patient.  Patient reports diabetes was diagnosed in 2007.   Family/Social History:  - FHx: no pertinent positives listed in [facility] - Tobacco: never smoker - Alcohol: denies use  Insurance coverage/medication affordability:  - Self-pay  Patient reports adherence with medications.  Current diabetes medications include: Januvia 100 mg daily, Lantus 10 units daily, metformin 1000 mg BID (takes two 500 mg tablets BID)  Patient denies hypoglycemic events.  Patient reported dietary habits: Eats 3 meals/day - Reports eating mainly beans and vegetables  - Switched from white bread to grain bread - Limits potatoes  - Admits to eating candy seldomly - denies drinking soda or sweet tea  Patient-reported exercise habits:  - Walks daily ~20 minutes    Patient denies nocturia.  Patient reports neuropathy. Patient reports visual changes. Patient reports self foot exams.   O:  POCT: 128  Home CBGs: gives range in the 170s-180s (tests in the evenings/post-prandial). 1 reading since last visit of 254  Lab Results  Component Value Date   HGBA1C 13.0 (A) 11/06/2018   There were no vitals filed for this visit.  Lipid Panel     Component Value Date/Time   CHOL 161 08/07/2018 1008   TRIG 170 (H) 08/07/2018 1008   HDL 39 (L) 08/07/2018 1008   CHOLHDL 4.1 08/07/2018 1008   CHOLHDL 3.0 04/15/2015 1219   VLDL 13 04/15/2015 1219   LDLCALC 88 08/07/2018 1008   Clinical ASCVD: No  The 10-year ASCVD risk score Mikey Bussing DC Jr., et al., 2013) is: 17%   Values used to calculate the score:     Age: 58 years     Sex: Male     Is Non-Hispanic African American: Yes     Diabetic: Yes     Tobacco smoker: No     Systolic Blood Pressure: 737 mmHg     Is  BP treated: No     HDL Cholesterol: 39 mg/dL     Total Cholesterol: 161 mg/dL    A/P: Diabetes longstanding currently uncontrolled. Patient is able to verbalize appropriate hypoglycemia management plan. Patient is adherent with medication.  Patient was without medication previous to 11/06/18 visit with PCP. Since resuming medications, home blood sugars have improved. Today's sugar at goal. A1c reflective of hyperglycemia prior to 4/15 visit with PCP. His hyperglycemia-associated symptoms have improved or remained stable with the exception of his blurred vision.   -Continued current regimen. -Commended patient's improved adherence; encouraged continued compliance -Extensively discussed pathophysiology of DM, recommended lifestyle interventions, dietary effects on glycemic control -Counseled on s/sx of and management of hypoglycemia -Next A1C anticipated 10/2018.   ASCVD risk - primary prevention in patient with DM. Last LDL 88. ASCVD risk score is not >20%. At least moderate intensity statin indicated at this time. -Started atorvastatin 20 mg. - Previous LFTs WNL. - Recommend f/u on liver function 4-6 weeks.   - LFTs, future   HM: pt UTD on vaccines.    Written patient instructions provided.  Total time in face to face counseling 15 minutes.   Follow up Pharmacist Clinic Visit in 1 month.     Benard Halsted, PharmD, [place] [phone]

## 2018-11-20 NOTE — Patient Instructions (Signed)
Thank you for coming to see me today. Please do the following:  1. Continue metformin, Januvia, and insulin. 2. Continue checking blood sugars at home.  3. Continue making the lifestyle changes we've discussed together during our visit. Diet and exercise play a significant role in improving your blood sugars.  4. Follow-up with me in 1 month.    Hypoglycemia or low blood sugar:   Low blood sugar can happen quickly and may become an emergency if not treated right away.   While this shouldn't happen often, it can be brought upon if you skip a meal or do not eat enough. Also, if your insulin or other diabetes medications are dosed too high, this can cause your blood sugar to go to low.   Warning signs of low blood sugar include: 1. Feeling shaky or dizzy 2. Feeling weak or tired  3. Excessive hunger 4. Feeling anxious or upset  5. Sweating even when you aren't exercising  What to do if I experience low blood sugar? 1. Check your blood sugar with your meter. If lower than 70, proceed to step 2.  2. Treat with 3-4 glucose tablets or 3 packets of regular sugar. If these aren't around, you can try hard candy. Yet another option would be to drink 4 ounces of fruit juice or 6 ounces of REGULAR soda.  3. Re-check your sugar in 15 minutes. If it is still below 70, do what you did in step 2 again. If has come back up, go ahead and eat a snack or small meal at this time.

## 2018-11-22 ENCOUNTER — Encounter: Payer: Self-pay | Admitting: Pharmacist

## 2018-12-05 MED FILL — ?BASAGLAR 100 UNITS/ML KWPE: 100 | 30 days supply | Qty: 3 | Fill #1

## 2018-12-11 MED FILL — metFORMIN HCL 500 MG TABS: 500 | 30 days supply | Qty: 120 | Fill #3

## 2018-12-20 ENCOUNTER — Other Ambulatory Visit: Payer: Self-pay

## 2018-12-20 ENCOUNTER — Encounter: Payer: Self-pay | Admitting: Pharmacist

## 2018-12-20 ENCOUNTER — Ambulatory Visit: Payer: Self-pay | Attending: Family Medicine | Admitting: Pharmacist

## 2018-12-20 DIAGNOSIS — Z794 Long term (current) use of insulin: Secondary | ICD-10-CM

## 2018-12-20 DIAGNOSIS — E119 Type 2 diabetes mellitus without complications: Secondary | ICD-10-CM

## 2018-12-20 LAB — GLUCOSE, POCT (MANUAL RESULT ENTRY): POC Glucose: 134 mg/dl — AB (ref 70–99)

## 2018-12-20 MED ORDER — GLUCOSE BLOOD VI STRP: ORAL_STRIP | 11 refills | Status: DC

## 2018-12-20 MED ORDER — TRUEPLUS LANCETS 28G MISC: 11 refills | Status: DC

## 2018-12-20 MED ORDER — TRUE METRIX METER W/DEVICE KIT: PACK | 0 refills | Status: DC

## 2018-12-20 MED FILL — TRUE METRIX TEST STRIP: 30 days supply | Qty: 100 | Fill #0

## 2018-12-20 MED FILL — !TRUE METRIX BLOOD GLUCOSE: 1 days supply | Qty: 1 | Fill #0

## 2018-12-20 MED FILL — TRUEplus LANCETS 28G MISC: 30 days supply | Qty: 100 | Fill #0

## 2018-12-20 MED FILL — ?ATORVASTATIN 20 MG TABLET: 20 | 30 days supply | Qty: 30 | Fill #1

## 2018-12-20 NOTE — Progress Notes (Signed)
    S:    PCP: Juluis Mire   No chief complaint on file.  Patient arrives in good spirits. Presents for diabetes evaluation, education, and management at the request of Sharyn Lull. Patient was referred on 11/06/18.  I saw him on 11/20/18 and his home blood sugars showed improvement. No changes were made to his regimen.   Patient reports diabetes was diagnosed in 2007.   Family/Social History:  - FHx: no pertinent positives listed in [facility] - Tobacco: never smoker - Alcohol: denies use  Insurance coverage/medication affordability:  - Self-pay  Patient reports adherence with medications.  Current diabetes medications include: Januvia 100 mg daily, Lantus 10 units daily, metformin 1000 mg BID (takes two 500 mg tablets BID)  Patient denies hypoglycemic events.  Patient reported dietary habits: Eats 3 meals/day - Reports eating mainly beans and vegetables  - Switched from white bread to grain bread - Limits potatoes  - Admits to eating candy seldomly - denies drinking soda or sweet tea  Patient-reported exercise habits:  - Walks daily ~20 minutes    Patient denies nocturia.  Patient reports neuropathy. Patient reports visual changes. Patient reports self foot exams.   O:  POCT: 134 Home CBGs: has not taken since last visit.  Lab Results  Component Value Date   HGBA1C 13.0 (A) 11/06/2018   There were no vitals filed for this visit.  Lipid Panel     Component Value Date/Time   CHOL 161 08/07/2018 1008   TRIG 170 (H) 08/07/2018 1008   HDL 39 (L) 08/07/2018 1008   CHOLHDL 4.1 08/07/2018 1008   CHOLHDL 3.0 04/15/2015 1219   VLDL 13 04/15/2015 1219   LDLCALC 88 08/07/2018 1008   Clinical ASCVD: No  The 10-year ASCVD risk score Mikey Bussing DC Jr., et al., 2013) is: 17%   Values used to calculate the score:     Age: 62 years     Sex: Male     Is Non-Hispanic African American: Yes     Diabetic: Yes     Tobacco smoker: No     Systolic Blood Pressure: 124 mmHg     Is BP  treated: No     HDL Cholesterol: 39 mg/dL     Total Cholesterol: 161 mg/dL    A/P: Diabetes longstanding currently uncontrolled. Patient is able to verbalize appropriate hypoglycemia management plan. Patient is adherent with medication but he is not checking BG at home. Will provide him with home testing supplies and have him f/u in 1 month.    -Continued current regimen. -[employer] supplies and education given. -Commended patient's improved adherence; encouraged continued compliance -Extensively discussed pathophysiology of DM, recommended lifestyle interventions, dietary effects on glycemic control -Counseled on s/sx of and management of hypoglycemia -Next A1C anticipated 01/2019   ASCVD risk - primary prevention in patient with DM. Last LDL 88. ASCVD risk score is not >20%. At least moderate intensity statin indicated at this time. -Started atorvastatin 20 mg. - LFTs  HM: pt UTD on vaccines.    Written patient instructions provided.  Total time in face to face counseling 15 minutes.   Follow up Pharmacist Clinic Visit in 1 month.     Benard Halsted, PharmD, [place] [phone]

## 2018-12-20 NOTE — Patient Instructions (Signed)
Thank you for coming to see me today. Please do the following:  1. Continue your medications. 2. Continue checking blood sugars at home.  3. Goal blood sugar in the morning is 80-130. 4. Goal blood sugar 2 hours after eating is less than 180. 5. Continue making the lifestyle changes we've discussed together during our visit. Diet and exercise play a significant role in improving your blood sugars.  6. Follow-up with me in 1 month.   Hypoglycemia or low blood sugar:   Low blood sugar can happen quickly and may become an emergency if not treated right away.   While this shouldn't happen often, it can be brought upon if you skip a meal or do not eat enough. Also, if your insulin or other diabetes medications are dosed too high, this can cause your blood sugar to go to low.   Warning signs of low blood sugar include: 1. Feeling shaky or dizzy 2. Feeling weak or tired  3. Excessive hunger 4. Feeling anxious or upset  5. Sweating even when you aren't exercising  What to do if I experience low blood sugar? 1. Check your blood sugar with your meter. If lower than 70, proceed to step 2.  2. Treat with 3-4 glucose tablets or 3 packets of regular sugar. If these aren't around, you can try hard candy. Yet another option would be to drink 4 ounces of fruit juice or 6 ounces of REGULAR soda.  3. Re-check your sugar in 15 minutes. If it is still below 70, do what you did in step 2 again. If has come back up, go ahead and eat a snack or small meal at this time.

## 2018-12-21 LAB — HEPATIC FUNCTION PANEL
ALT: 23 IU/L (ref 0–44)
AST: 24 IU/L (ref 0–40)
Albumin: 4.9 g/dL (ref 3.8–4.9)
Alkaline Phosphatase: 99 IU/L (ref 39–117)
Bilirubin Total: 0.4 mg/dL (ref 0.0–1.2)
Bilirubin, Direct: 0.11 mg/dL (ref 0.00–0.40)
Total Protein: 7.4 g/dL (ref 6.0–8.5)

## 2018-12-25 ENCOUNTER — Encounter (INDEPENDENT_AMBULATORY_CARE_PROVIDER_SITE_OTHER): Payer: Self-pay | Admitting: Primary Care

## 2018-12-25 ENCOUNTER — Ambulatory Visit (INDEPENDENT_AMBULATORY_CARE_PROVIDER_SITE_OTHER): Payer: Self-pay | Admitting: Primary Care

## 2018-12-25 ENCOUNTER — Other Ambulatory Visit: Payer: Self-pay

## 2018-12-25 DIAGNOSIS — E1121 Type 2 diabetes mellitus with diabetic nephropathy: Secondary | ICD-10-CM

## 2018-12-25 DIAGNOSIS — Z794 Long term (current) use of insulin: Secondary | ICD-10-CM

## 2018-12-25 DIAGNOSIS — E119 Type 2 diabetes mellitus without complications: Secondary | ICD-10-CM

## 2018-12-25 DIAGNOSIS — E1040 Type 1 diabetes mellitus with diabetic neuropathy, unspecified: Secondary | ICD-10-CM

## 2018-12-25 NOTE — Progress Notes (Signed)
Virtual Visit via Telephone Note  I connected with Spencer Robles on 12/25/18 at 11:10 AM EDT by telephone and verified that I am speaking with the correct person using two identifiers.   I discussed the limitations, risks, security and privacy concerns of performing an evaluation and management service by telephone and the availability of in person appointments. I also discussed with the patient that there may be a patient responsible charge related to this service. The patient expressed understanding and agreed to proceed.   History of Present Illness: Spencer Robles is having a telemetry visit today for complaints of his left great toe left  big toe feels like a crack and pain.  He has looked under both feet no open areas.  Explained to patient probably diabetic neuropathy with his unclear control diabetes.   Observations/Objective: Review of Systems  Constitutional: Negative.   HENT: Negative.   Eyes: Negative.   Respiratory: Negative.   Cardiovascular: Negative.   Gastrointestinal: Negative.   Genitourinary: Negative.   Musculoskeletal: Negative.   Skin:       Left great toe  Neurological: Negative.   Endo/Heme/Allergies: Negative.   Psychiatric/Behavioral: Negative.     Assessment and Plan: Spencer Robles was seen today for dryness.  Diagnoses and all orders for this visit:  Type 2 diabetes mellitus without complication, with long-term current use of insulin ([place]) A1c November 06, 2018 13.  Indicating noncompliance with diet exercise and medication.  Will refer patient to clinical pharmacist for better control of diabetes.  Type 2 diabetes mellitus with diabetic neuropathy, unspecified [facility]  Spencer Robles concerns about feeling his skin cracking under his great left toe.  This does show he is more conscientious and care of his feet.  Patient is currently taking gabapentin for diabetic neuropathy.     Follow Up Instructions:    I discussed the assessment and treatment plan with the  patient. The patient was provided an opportunity to ask questions and all were answered. The patient agreed with the plan and demonstrated an understanding of the instructions.   The patient was advised to call back or seek an in-person evaluation if the symptoms worsen or if the condition fails to improve as anticipated.  I provided 12 minutes of non-face-to-face time during this encounter.   Kerin Perna, NP

## 2019-01-16 MED FILL — ?METFORMIN HCL 500MG TABLET: 500 | 30 days supply | Qty: 120 | Fill #0

## 2019-01-16 MED FILL — ?BASAGLAR 100 UNITS/ML KWPE: 100 | 30 days supply | Qty: 3 | Fill #2

## 2019-01-20 ENCOUNTER — Ambulatory Visit: Payer: Self-pay | Admitting: Pharmacist

## 2019-02-11 MED FILL — !JANUVIA 100MG TABLET: 100 | 30 days supply | Qty: 30 | Fill #1

## 2019-02-24 MED FILL — ?METFORMIN HCL 500MG TABLET: 500 | 30 days supply | Qty: 120 | Fill #1

## 2019-03-03 MED FILL — ?ATORVASTATIN 20 MG TABLET: 20 | 30 days supply | Qty: 30 | Fill #2

## 2019-03-03 MED FILL — ?BASAGLAR 100 UNITS/ML KWPE: 100 | 30 days supply | Qty: 3 | Fill #3

## 2019-03-14 MED FILL — TRUE METRIX TEST STRIP: 30 days supply | Qty: 100 | Fill #1

## 2019-03-14 MED FILL — TRUEplus LANCETS 28G MISC: 30 days supply | Qty: 100 | Fill #1

## 2019-03-14 MED FILL — !JANUVIA 100MG TABLET: 100 | 30 days supply | Qty: 30 | Fill #2

## 2019-08-25 ENCOUNTER — Encounter (HOSPITAL_COMMUNITY): Payer: Self-pay

## 2019-08-25 ENCOUNTER — Inpatient Hospital Stay (HOSPITAL_COMMUNITY)
Admission: EM | Admit: 2019-08-25 | Discharge: 2019-08-28 | DRG: 074 | Disposition: A | Discharge status: Home / Self Care | Payer: Medicaid Other | Attending: Internal Medicine | Admitting: Internal Medicine

## 2019-08-25 ENCOUNTER — Other Ambulatory Visit: Payer: Self-pay

## 2019-08-25 ENCOUNTER — Emergency Department (HOSPITAL_COMMUNITY): Payer: Medicaid Other

## 2019-08-25 DIAGNOSIS — E785 Hyperlipidemia, unspecified: Secondary | ICD-10-CM | POA: Diagnosis present

## 2019-08-25 DIAGNOSIS — E118 Type 2 diabetes mellitus with unspecified complications: Secondary | ICD-10-CM | POA: Diagnosis present

## 2019-08-25 DIAGNOSIS — E119 Type 2 diabetes mellitus without complications: Secondary | ICD-10-CM

## 2019-08-25 DIAGNOSIS — E11628 Type 2 diabetes mellitus with other skin complications: Secondary | ICD-10-CM | POA: Diagnosis present

## 2019-08-25 DIAGNOSIS — L03115 Cellulitis of right lower limb: Secondary | ICD-10-CM | POA: Diagnosis present

## 2019-08-25 DIAGNOSIS — E11621 Type 2 diabetes mellitus with foot ulcer: Secondary | ICD-10-CM | POA: Diagnosis present

## 2019-08-25 DIAGNOSIS — L97519 Non-pressure chronic ulcer of other part of right foot with unspecified severity: Secondary | ICD-10-CM | POA: Diagnosis present

## 2019-08-25 DIAGNOSIS — L089 Local infection of the skin and subcutaneous tissue, unspecified: Secondary | ICD-10-CM | POA: Diagnosis present

## 2019-08-25 DIAGNOSIS — E1169 Type 2 diabetes mellitus with other specified complication: Secondary | ICD-10-CM | POA: Diagnosis not present

## 2019-08-25 DIAGNOSIS — M7989 Other specified soft tissue disorders: Secondary | ICD-10-CM | POA: Diagnosis present

## 2019-08-25 DIAGNOSIS — M79601 Pain in right arm: Secondary | ICD-10-CM

## 2019-08-25 DIAGNOSIS — E1165 Type 2 diabetes mellitus with hyperglycemia: Secondary | ICD-10-CM | POA: Diagnosis present

## 2019-08-25 DIAGNOSIS — Z7982 Long term (current) use of aspirin: Secondary | ICD-10-CM

## 2019-08-25 DIAGNOSIS — Z6836 Body mass index (BMI) 36.0-36.9, adult: Secondary | ICD-10-CM

## 2019-08-25 DIAGNOSIS — L02611 Cutaneous abscess of right foot: Secondary | ICD-10-CM | POA: Diagnosis present

## 2019-08-25 DIAGNOSIS — Z794 Long term (current) use of insulin: Secondary | ICD-10-CM | POA: Diagnosis not present

## 2019-08-25 DIAGNOSIS — Z20822 Contact with and (suspected) exposure to covid-19: Secondary | ICD-10-CM | POA: Diagnosis present

## 2019-08-25 DIAGNOSIS — E1142 Type 2 diabetes mellitus with diabetic polyneuropathy: Secondary | ICD-10-CM | POA: Diagnosis present

## 2019-08-25 DIAGNOSIS — M25562 Pain in left knee: Secondary | ICD-10-CM

## 2019-08-25 DIAGNOSIS — R202 Paresthesia of skin: Secondary | ICD-10-CM

## 2019-08-25 DIAGNOSIS — E669 Obesity, unspecified: Secondary | ICD-10-CM | POA: Diagnosis present

## 2019-08-25 DIAGNOSIS — G8929 Other chronic pain: Secondary | ICD-10-CM

## 2019-08-25 DIAGNOSIS — G546 Phantom limb syndrome with pain: Secondary | ICD-10-CM | POA: Diagnosis present

## 2019-08-25 DIAGNOSIS — Z791 Long term (current) use of non-steroidal anti-inflammatories (NSAID): Secondary | ICD-10-CM | POA: Diagnosis not present

## 2019-08-25 DIAGNOSIS — Z8614 Personal history of Methicillin resistant Staphylococcus aureus infection: Secondary | ICD-10-CM

## 2019-08-25 DIAGNOSIS — Z760 Encounter for issue of repeat prescription: Secondary | ICD-10-CM

## 2019-08-25 DIAGNOSIS — Z9114 Patient's other noncompliance with medication regimen: Secondary | ICD-10-CM

## 2019-08-25 DIAGNOSIS — Z89421 Acquired absence of other right toe(s): Secondary | ICD-10-CM | POA: Diagnosis not present

## 2019-08-25 DIAGNOSIS — D696 Thrombocytopenia, unspecified: Secondary | ICD-10-CM | POA: Diagnosis present

## 2019-08-25 DIAGNOSIS — L0291 Cutaneous abscess, unspecified: Secondary | ICD-10-CM

## 2019-08-25 LAB — SEDIMENTATION RATE: Sed Rate: 7 mm/hr (ref 0–16)

## 2019-08-25 LAB — URINALYSIS, ROUTINE W REFLEX MICROSCOPIC
Bacteria, UA: NONE SEEN
Bilirubin Urine: NEGATIVE
Glucose, UA: >=500 mg/dL — AB
Hgb urine dipstick: NEGATIVE
Ketones, ur: 20 mg/dL — AB
Leukocytes,Ua: NEGATIVE
Nitrite: NEGATIVE
Protein, ur: NEGATIVE mg/dL
Specific Gravity, Urine: 1.027 (ref 1.005–1.030)
pH: 5 (ref 5.0–8.0)

## 2019-08-25 LAB — POCT I-STAT EG7
Acid-Base Excess: 5 mmol/L — ABNORMAL HIGH (ref 0.0–2.0)
Bicarbonate: 29.2 mmol/L — ABNORMAL HIGH (ref 20.0–28.0)
Calcium, Ion: 1.11 mmol/L — ABNORMAL LOW (ref 1.15–1.40)
HCT: 44 % (ref 39.0–52.0)
Hemoglobin: 15 g/dL (ref 13.0–17.0)
O2 Saturation: 100 %
Potassium: 4.1 mmol/L (ref 3.5–5.1)
Sodium: 132 mmol/L — ABNORMAL LOW (ref 135–145)
TCO2: 30 mmol/L (ref 22–32)
pCO2, Ven: 41 mmHg — ABNORMAL LOW (ref 44.0–60.0)
pH, Ven: 7.459 — ABNORMAL HIGH (ref 7.250–7.430)
pO2, Ven: 164 mmHg — ABNORMAL HIGH (ref 32.0–45.0)

## 2019-08-25 LAB — CBC
HCT: 44 % (ref 39.0–52.0)
Hemoglobin: 15 g/dL (ref 13.0–17.0)
MCH: 28.5 pg (ref 26.0–34.0)
MCHC: 34.1 g/dL (ref 30.0–36.0)
MCV: 83.7 fL (ref 80.0–100.0)
Platelets: 146 10*3/uL — ABNORMAL LOW (ref 150–400)
RBC: 5.26 MIL/uL (ref 4.22–5.81)
RDW: 12.6 % (ref 11.5–15.5)
WBC: 11.2 10*3/uL — ABNORMAL HIGH (ref 4.0–10.5)
nRBC: 0 % (ref 0.0–0.2)

## 2019-08-25 LAB — BASIC METABOLIC PANEL
Anion gap: 14 (ref 5–15)
BUN: 11 mg/dL (ref 6–20)
CO2: 24 mmol/L (ref 22–32)
Calcium: 9.8 mg/dL (ref 8.9–10.3)
Chloride: 95 mmol/L — ABNORMAL LOW (ref 98–111)
Creatinine, Ser: 0.97 mg/dL (ref 0.61–1.24)
GFR calc Af Amer: >60 mL/min (ref >60)
GFR calc non Af Amer: >60 mL/min (ref >60)
Glucose, Bld: 390 mg/dL — ABNORMAL HIGH (ref 70–99)
Potassium: 4.3 mmol/L (ref 3.5–5.1)
Sodium: 133 mmol/L — ABNORMAL LOW (ref 135–145)

## 2019-08-25 LAB — LACTIC ACID, PLASMA
Lactic Acid, Venous: 1.5 mmol/L (ref 0.5–1.9)
Lactic Acid, Venous: 1.5 mmol/L (ref 0.5–1.9)

## 2019-08-25 LAB — HEPATIC FUNCTION PANEL
ALT: 17 U/L (ref 0–44)
AST: 25 U/L (ref 15–41)
Albumin: 4.1 g/dL (ref 3.5–5.0)
Alkaline Phosphatase: 96 U/L (ref 38–126)
Bilirubin, Direct: 0.3 mg/dL — ABNORMAL HIGH (ref 0.0–0.2)
Indirect Bilirubin: 1 mg/dL — ABNORMAL HIGH (ref 0.3–0.9)
Total Bilirubin: 1.3 mg/dL — ABNORMAL HIGH (ref 0.3–1.2)
Total Protein: 7.3 g/dL (ref 6.5–8.1)

## 2019-08-25 LAB — RESPIRATORY PANEL BY RT PCR (FLU A&B, COVID)
Influenza A by PCR: NEGATIVE
Influenza B by PCR: NEGATIVE
SARS Coronavirus 2 by RT PCR: NEGATIVE

## 2019-08-25 LAB — GLUCOSE, CAPILLARY
Glucose-Capillary: 261 mg/dL — ABNORMAL HIGH (ref 70–99)
Glucose-Capillary: 191 mg/dL — ABNORMAL HIGH (ref 70–99)

## 2019-08-25 LAB — HEMOGLOBIN A1C
Hgb A1c MFr Bld: 14.3 % — ABNORMAL HIGH (ref 4.8–5.6)
Mean Plasma Glucose: 363.71 mg/dL

## 2019-08-25 LAB — CBG MONITORING, ED: Glucose-Capillary: 391 mg/dL — ABNORMAL HIGH (ref 70–99)

## 2019-08-25 MED ORDER — PIPERACILLIN-TAZOBACTAM 3.375 G IVPB
3.3750 g | Freq: Three times a day (TID) | INTRAVENOUS | Status: DC
  Administered 2019-08-25 – 2019-08-28 (×9): 3.375 g via INTRAVENOUS
  Filled 2019-08-25 (×9): qty 50

## 2019-08-25 MED ORDER — INSULIN GLARGINE 100 UNIT/ML ~~LOC~~ SOLN
15.0000 [IU] | Freq: Every day | SUBCUTANEOUS | Status: DC
  Administered 2019-08-25 – 2019-08-27 (×3): 15 [IU] via SUBCUTANEOUS
  Filled 2019-08-25 (×3): qty 0.15

## 2019-08-25 MED ORDER — ACETAMINOPHEN 650 MG RE SUPP: 650.0000 mg | Freq: Four times a day (QID) | RECTAL | Status: DC | PRN

## 2019-08-25 MED ORDER — ASPIRIN EC 81 MG PO TBEC
81.0000 mg | DELAYED_RELEASE_TABLET | Freq: Every day | ORAL | Status: DC
  Administered 2019-08-25 – 2019-08-28 (×4): 81 mg via ORAL
  Filled 2019-08-25 (×4): qty 1

## 2019-08-25 MED ORDER — OXYCODONE HCL 5 MG PO TABS
5.0000 mg | ORAL_TABLET | ORAL | Status: DC | PRN
  Filled 2019-08-25: qty 1

## 2019-08-25 MED ORDER — VANCOMYCIN HCL 1750 MG/350ML IV SOLN
1750.0000 mg | Freq: Once | INTRAVENOUS | Status: AC
  Administered 2019-08-25: 18:00:00 1750 mg via INTRAVENOUS
  Filled 2019-08-25 (×3): qty 350

## 2019-08-25 MED ORDER — INSULIN ASPART 100 UNIT/ML ~~LOC~~ SOLN
10.0000 [IU] | Freq: Once | SUBCUTANEOUS | Status: AC
  Administered 2019-08-25: 10 [IU] via INTRAVENOUS

## 2019-08-25 MED ORDER — PIPERACILLIN-TAZOBACTAM 3.375 G IVPB 30 MIN
3.7500 g | Freq: Once | INTRAVENOUS | Status: AC
  Administered 2019-08-25: 3.75 g via INTRAVENOUS
  Filled 2019-08-25: qty 100

## 2019-08-25 MED ORDER — INSULIN ASPART 100 UNIT/ML ~~LOC~~ SOLN
0.0000 [IU] | Freq: Three times a day (TID) | SUBCUTANEOUS | Status: DC
  Administered 2019-08-25 – 2019-08-26 (×3): 8 [IU] via SUBCUTANEOUS
  Administered 2019-08-26: 12:00:00 11 [IU] via SUBCUTANEOUS
  Administered 2019-08-27: 5 [IU] via SUBCUTANEOUS
  Administered 2019-08-27: 3 [IU] via SUBCUTANEOUS
  Administered 2019-08-27: 09:00:00 5 [IU] via SUBCUTANEOUS
  Administered 2019-08-28: 14:00:00 2 [IU] via SUBCUTANEOUS
  Administered 2019-08-28: 5 [IU] via SUBCUTANEOUS

## 2019-08-25 MED ORDER — INSULIN ASPART 100 UNIT/ML ~~LOC~~ SOLN
0.0000 [IU] | Freq: Every day | SUBCUTANEOUS | Status: DC
  Administered 2019-08-26 – 2019-08-27 (×2): 2 [IU] via SUBCUTANEOUS

## 2019-08-25 MED ORDER — SODIUM CHLORIDE 0.9 % IV SOLN: Freq: Once | INTRAVENOUS | Status: AC

## 2019-08-25 MED ORDER — ENOXAPARIN SODIUM 40 MG/0.4ML ~~LOC~~ SOLN
40.0000 mg | SUBCUTANEOUS | Status: DC
  Administered 2019-08-25: 40 mg via SUBCUTANEOUS
  Filled 2019-08-25: qty 0.4

## 2019-08-25 MED ORDER — ACETAMINOPHEN 325 MG PO TABS
650.0000 mg | ORAL_TABLET | Freq: Four times a day (QID) | ORAL | Status: DC | PRN
  Administered 2019-08-26: 10:00:00 650 mg via ORAL
  Filled 2019-08-25: qty 2

## 2019-08-25 MED ORDER — GABAPENTIN 300 MG PO CAPS
600.0000 mg | ORAL_CAPSULE | Freq: Three times a day (TID) | ORAL | Status: DC
  Administered 2019-08-25 – 2019-08-28 (×9): 600 mg via ORAL
  Filled 2019-08-25 (×9): qty 2

## 2019-08-25 MED ORDER — ATORVASTATIN CALCIUM 10 MG PO TABS
20.0000 mg | ORAL_TABLET | Freq: Every day | ORAL | Status: DC
  Administered 2019-08-25 – 2019-08-28 (×4): 20 mg via ORAL
  Filled 2019-08-25 (×4): qty 2

## 2019-08-25 MED ORDER — METFORMIN HCL 500 MG PO TABS
1000.0000 mg | ORAL_TABLET | Freq: Two times a day (BID) | ORAL | Status: DC
  Administered 2019-08-25 – 2019-08-28 (×6): 1000 mg via ORAL
  Filled 2019-08-25 (×6): qty 2

## 2019-08-25 MED ORDER — LINAGLIPTIN 5 MG PO TABS
5.0000 mg | ORAL_TABLET | Freq: Every day | ORAL | Status: DC
  Administered 2019-08-25 – 2019-08-28 (×4): 5 mg via ORAL
  Filled 2019-08-25 (×4): qty 1

## 2019-08-25 MED ORDER — VANCOMYCIN HCL 750 MG/150ML IV SOLN
750.0000 mg | Freq: Three times a day (TID) | INTRAVENOUS | Status: DC
  Administered 2019-08-25 – 2019-08-28 (×9): 750 mg via INTRAVENOUS
  Filled 2019-08-25 (×9): qty 150

## 2019-08-25 NOTE — Progress Notes (Signed)
Pharmacy Antibiotic Note  Spencer Robles is a 59 y.o. male admitted on 08/25/2019 with DFI, WBC 11.2,  LA 1.5.  Pharmacy has been consulted for vancomycin and zosyn dosing.   Plan: Vancomycin 1750mg  IV x 1, then 750 mg IV every 8 hours Goal AUC 400-550. Expected AUC: 493 SCr used: 0.97  Zosyn 3.375g IV every 8 hours (extended infusion) Monitor renal function, Cx and clinical progression to narrow Vancomycin levels at steady state     Temp (24hrs), Avg:99.4 F (37.4 C), Min:99.4 F (37.4 C), Max:99.4 F (37.4 C)  Recent Labs  Lab 08/25/19 1204 08/25/19 1205 08/25/19 1333  WBC 11.2*  --   --   CREATININE 0.97  --   --   LATICACIDVEN  --  1.5 1.5    CrCl cannot be calculated (Unknown ideal weight.).    No Known Allergies  Bertis Ruddy, PharmD Clinical Pharmacist Please check AMION for all [place] numbers 08/25/2019 2:17 PM

## 2019-08-25 NOTE — ED Notes (Signed)
Pt transported to XR.  

## 2019-08-25 NOTE — ED Triage Notes (Signed)
Pt reports right  Foot pain, swelling and redness noted, hx of diabetes, stopped taking his insulin because "it is a salt product and it was drying me out" pt has not checked his CBG in a while. 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

## 2019-08-25 NOTE — H&P (Addendum)
History and Physical    Spencer Robles TLX:[id number] DOB: 1961-02-03 DOA: 08/25/2019  PCP: Clent Demark, PA-C   Patient coming from: Home  I have personally briefly reviewed patient's old medical records in [place]  Chief Complaint: Right foot swelling and pain  HPI: Spencer Robles is a 59 y.o. 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  No fever or chills. Two days ago, he start to have right shin soreness and he notice the right fore foot on the amputation stump edge developed a blister. He had a DM foot infection on the right foot, 2 years ago when he had to get his first 2 toes amputated.  He reports about 4-5 months ago, he himself recently decided to stop insulin after hearing "insulin dries my body"  He further stopped taking Metformin and other meds, he claims that he had hard time to get them refilled. Otherwise, he is felt good.  He has not had a fever.  He denies any chest pain or cough.  No abdominal pain.  No vomiting no diarrhea.  ED Course: DM foot infection suspected and orthopedic surgery informed and will see the patient.  Review of Systems: As per HPI otherwise 10 point review of systems negative.    Past Medical History:  Diagnosis Date  . Diabetes mellitus without complication ([place])   . Epididymitis 08/15/2017  . Hx of necrotising fasciitis 06/2017   RLE/notes 08/15/2017  . Scrotal swelling    left; due to Epididymitis/notes 08/15/2017  . Sleep apnea    does not use a cpap    Past Surgical History:  Procedure Laterality Date  . AMPUTATION TOE Right 07/06/2017   Procedure: RIGHT GREAT TOE AND SECOND TOE AMPUTATION;  Surgeon: Newt Minion, MD;  Location: [place];  Service: Orthopedics;  Laterality: Right;  . CIRCUMCISION    . MASS BIOPSY  ~ 2008   scrotum/notes 08/15/2017  .  SCROTAL EXPLORATION N/A 08/21/2017   Procedure: INCISION AND DRAINAGE SCROTAL ABSCESS, CYSTOSCOPY, COMPLEX INSERTION OF URETHERAL FOLEY CATHETER;  Surgeon: Raynelle Bring, MD;  Location: [facility];  Service: Urology;  Laterality: N/A;  . SCROTAL EXPLORATION N/A 08/30/2017   Procedure: SCROTUM DEBRIDEMENT;  Surgeon: Raynelle Bring, MD;  Location: [facility];  Service: Urology;  Laterality: N/A;     reports that he has never smoked. He has never used smokeless tobacco. He reports that he does not drink alcohol or use drugs.  No Known Allergies  DM ran in his family  Prior to Admission medications   Medication Sig Start Date End Date Taking? Authorizing Provider  ibuprofen (ADVIL) 800 MG tablet Take 800 mg by mouth every 6 (six) hours as needed for mild pain.    Yes [provider]  Insulin Glargine (LANTUS SOLOSTAR) 100 UNIT/ML Solostar Pen Inject 10 Units into the skin daily. 11/06/18  Yes Kerin Perna, NP  sitaGLIPtin (JANUVIA) 100 MG tablet Take 1 tablet (100 mg total) by mouth daily. 11/06/18  Yes Kerin Perna, NP  aspirin EC 81 MG tablet Take 1 tablet (81 mg total) by mouth daily. Patient not taking: Reported on 12/25/2018 05/08/18   Clent Demark, PA-C  atorvastatin (LIPITOR) 20 MG tablet Take 1 tablet (20 mg total) by mouth daily. Patient not taking: Reported on 08/25/2019 11/20/18   Charlott Rakes, MD  Blood Glucose Monitoring Suppl (TRUE METRIX METER) w/Device KIT Use to check blood sugar up to 3 times daily. Patient not taking: Reported on 12/25/2018 12/20/18   Charlott Rakes, MD  gabapentin (NEURONTIN) 300 MG capsule Take 2 capsules (600 mg total) by mouth 3 (three) times daily. Patient not taking: Reported on 12/25/2018 05/08/18   Clent Demark, PA-C  glucose blood (TRUE METRIX BLOOD GLUCOSE TEST) test strip Use as instructed to check blood sugar up to 3 times daily. Patient not taking: Reported on 12/25/2018 12/20/18   Charlott Rakes, MD  metFORMIN (GLUCOPHAGE) 500 MG  tablet Take 2 tablets (1,000 mg total) by mouth 2 (two) times daily with a meal. Patient not taking: Reported on 08/25/2019 11/06/18   Kerin Perna, NP  TRUEplus Lancets 28G MISC Use as instructed to check blood sugar up to 3 times daily. Patient not taking: Reported on 12/25/2018 12/20/18   Charlott Rakes, MD    Physical Exam: Vitals:   08/25/19 1142 08/25/19 1335 08/25/19 1337 08/25/19 1345  BP: 120/80 (!) 127/93  131/72  Pulse: (!) 103 95 86 94  Resp: 18   19  Temp: 99.4 F (37.4 C)     TempSrc: Oral     SpO2: 98% 97% 97% 96%    Constitutional: NAD, calm, comfortable Vitals:   08/25/19 1142 08/25/19 1335 08/25/19 1337 08/25/19 1345  BP: 120/80 (!) 127/93  131/72  Pulse: (!) 103 95 86 94  Resp: 18   19  Temp: 99.4 F (37.4 C)     TempSrc: Oral     SpO2: 98% 97% 97% 96%   Eyes: PERRL, lids and conjunctivae normal ENMT: Mucous membranes are moist. Posterior pharynx clear of any exudate or lesions.Normal dentition.  Neck: normal, supple, no masses, no thyromegaly Respiratory: clear to auscultation bilaterally, no wheezing, no crackles. Normal respiratory effort. No accessory muscle use.  Cardiovascular: Regular rate and rhythm, no murmurs / rubs / gallops. No extremity edema. 2+ pedal pulses. No carotid bruits.  Abdomen: no tenderness, no masses palpated. No hepatosplenomegaly. Bowel sounds positive.  Musculoskeletal: no clubbing / cyanosis. No joint deformity upper and lower extremities. Good ROM, no contractures. Normal muscle tone.  Skin: right forefoot swelling and tender, right shin warm to touch and rash to the mid level. Neurologic: Decreased light tough sensation of his feet. Psychiatric: Normal judgment and insight. Alert and oriented x 3. Normal mood.         (Anything < 9 systems with 2 bullets each down codes to level 1) (If patient refuses exam can't bill higher level) (Make sure to document decubitus ulcers present on admission -- if possible -- and  whether patient has chronic indwelling catheter at time of admission)  Labs on Admission: I have personally reviewed following labs and imaging studies  CBC: Recent Labs  Lab 08/25/19 1204  WBC 11.2*  HGB 15.0  HCT 44.0  MCV 83.7  PLT 462*   Basic Metabolic Panel: Recent Labs  Lab 08/25/19 1204  NA 133*  K 4.3  CL 95*  CO2 24  GLUCOSE 390*  BUN 11  CREATININE 0.97  CALCIUM 9.8   GFR: CrCl cannot be calculated (Unknown ideal weight.). Liver Function Tests: No results for input(s): AST, ALT, ALKPHOS, BILITOT, PROT, ALBUMIN in the last 168 hours. No results for input(s): LIPASE, AMYLASE in the last 168 hours. No results for input(s): AMMONIA in the last 168 hours. Coagulation Profile: No results for input(s): INR, PROTIME in the last 168 hours.  Cardiac Enzymes: No results for input(s): CKTOTAL, CKMB, CKMBINDEX, TROPONINI in the last 168 hours. BNP (last 3 results) No results for input(s): PROBNP in the last 8760 hours. HbA1C: Recent Labs    08/25/19 1333  HGBA1C 14.3*   CBG: Recent Labs  Lab 08/25/19 1155  GLUCAP 391*   Lipid Profile: No results for input(s): CHOL, HDL, LDLCALC, TRIG, CHOLHDL, LDLDIRECT in the last 72 hours. Thyroid Function Tests: No results for input(s): TSH, T4TOTAL, FREET4, T3FREE, THYROIDAB in the last 72 hours. Anemia Panel: No results for input(s): VITAMINB12, FOLATE, FERRITIN, TIBC, IRON, RETICCTPCT in the last 72 hours. Urine analysis:    Component Value Date/Time   COLORURINE YELLOW 02/19/2018 [employer] 02/19/2018 1715   LABSPEC 1.018 02/19/2018 1715   PHURINE 7.0 02/19/2018 1715   GLUCOSEU NEGATIVE 02/19/2018 1715   HGBUR NEGATIVE 02/19/2018 1715   [place] 02/19/2018 1715   BILIRUBINUR neg 10/08/2017 1157   KETONESUR NEGATIVE 02/19/2018 1715   PROTEINUR NEGATIVE 02/19/2018 1715   UROBILINOGEN 0.2 10/08/2017 1157   UROBILINOGEN 0.2 08/17/2015 1250   NITRITE NEGATIVE 02/19/2018 1715    LEUKOCYTESUR TRACE (A) 02/19/2018 1715    Radiological Exams on Admission: DG Foot Complete Right  Result Date: 08/25/2019 CLINICAL DATA:  59 year old male with a history of right foot tenderness after amputation EXAM: RIGHT FOOT COMPLETE - 3+ VIEW COMPARISON:  07/03/2017 FINDINGS: Surgical changes of prior right great toe and second toe amputation. No acute fracture. No erosive changes. No subcutaneous gas. Soft tissue swelling on the dorsum of the forefoot. No radiopaque foreign body. Degenerative changes of the interphalangeal joints. IMPRESSION: Negative for acute bony abnormality. Surgical changes of the right foot of prior first and second toe amputation. Given the history, correlation with noninvasive testing may be useful, as resting pain may be secondary to CLI/arterial insufficiency. Electronically Signed   By: Corrie Mckusick D.O.   On: 08/25/2019 13:22    EKG: Independently reviewed.   Assessment/Plan Active Problems:   * No active hospital problems. *  DM foot infection on right foot stump with cellulitis of right foot and shin. Suspect deeper infection such as abscess and/or Osteomyelitis, send ESR Vanco (Resistant MRSA in 2019 Scrotal infection despite a negative MRSA screening) and Zosyn for now Further imaging vs I&D defer to Ortho May need long term ABX Educate pt about to choose shoes with wide toe box. Monitor BP for now.  Poorly controlled DM from non-compliance Educate pt regarding importance of insulin and other DM meds, pt voiced understanding. Consult transition care team, may need home care, pill count etc. Restart Metformin, Januvia Lantus and sliding scale for now.  HLD Resume statin.   DVT prophylaxis: Lovenox Code Status: Full Code Family Communication: None at bedside Disposition Plan: Depends on Ortho plan Consults called: Ortho Admission status: MedSurg   Lequita Halt MD [facility] Pager [phone]  If 7PM-7AM, please contact  night-coverage [email or web address] Password [email or web address]  08/25/2019, 1:55 PM

## 2019-08-25 NOTE — ED Provider Notes (Signed)
[facility] EMERGENCY DEPARTMENT Provider Note   CSN: [id number] Arrival date & time: 08/25/19  1128     History Chief Complaint  Patient presents with  . Foot Pain    Spencer Robles is a 59 y.o. male.  HPI Reports that he developed pain in the right foot around the site of his prior amputation starting yesterday.  At first it just seemed like some discomfort.  He put on his shoe and went to church.  He reports then later that night there was a little more redness and it seems slightly swollen.  He reports today, there is some discomfort on the front of his leg and within the past hour a raised, fluid-filled blister has formed.  He reports he is concerned because he had such a bad infection in his foot 2 years ago when he had to get his first 2 toes amputated.  He reports this time he did not want to wait too long before getting treatment.  He reports his blood sugars are elevated.  He has not been using his insulin recently (will need to clarify exactly how long).  Patient reports he felt a little fatigued off and on yesterday.  Otherwise, he is felt good.  He has not had a fever.  He denies any chest pain or cough.  No abdominal pain.  No vomiting no diarrhea.    Past Medical History:  Diagnosis Date  . Diabetes mellitus without complication ([place])   . Epididymitis 08/15/2017  . Hx of necrotising fasciitis 06/2017   RLE/notes 08/15/2017  . Scrotal swelling    left; due to Epididymitis/notes 08/15/2017  . Sleep apnea    does not use a cpap    Patient Active Problem List   Diagnosis Date Noted  . Chronic pain of left knee 03/05/2018  . Scrotal abscess 08/30/2017  . Abscess 08/30/2017  . Epididymoorchitis 08/15/2017  . Great toe amputation status, right 07/24/2017  . Diabetic foot infection ([place])   . Subacute osteomyelitis, right ankle and foot ([place])   . Diabetic foot ([place]) 07/03/2017  . Diabetes mellitus type 2, uncomplicated ([place]) 40/98/1191  .  Prehypertension 04/15/2015    Past Surgical History:  Procedure Laterality Date  . AMPUTATION TOE Right 07/06/2017   Procedure: RIGHT GREAT TOE AND SECOND TOE AMPUTATION;  Surgeon: Newt Minion, MD;  Location: [facility];  Service: Orthopedics;  Laterality: Right;  . CIRCUMCISION    . MASS BIOPSY  ~ 2008   scrotum/notes 08/15/2017  . SCROTAL EXPLORATION N/A 08/21/2017   Procedure: INCISION AND DRAINAGE SCROTAL ABSCESS, CYSTOSCOPY, COMPLEX INSERTION OF URETHERAL FOLEY CATHETER;  Surgeon: Raynelle Bring, MD;  Location: [facility];  Service: Urology;  Laterality: N/A;  . SCROTAL EXPLORATION N/A 08/30/2017   Procedure: SCROTUM DEBRIDEMENT;  Surgeon: Raynelle Bring, MD;  Location: [facility];  Service: Urology;  Laterality: N/A;       No family history on file.  Social History   Tobacco Use  . Smoking status: Never Smoker  . Smokeless tobacco: Never Used  Substance Use Topics  . Alcohol use: No    Alcohol/week: 0.0 standard drinks  . Drug use: No    Home Medications Prior to Admission medications   Medication Sig Start Date End Date Taking? Authorizing Provider  ibuprofen (ADVIL) 800 MG tablet Take 800 mg by mouth every 6 (six) hours as needed for mild pain.    Yes [provider]  Insulin Glargine (LANTUS SOLOSTAR) 100 UNIT/ML Solostar Pen Inject 10  Units into the skin daily. 11/06/18  Yes Kerin Perna, NP  sitaGLIPtin (JANUVIA) 100 MG tablet Take 1 tablet (100 mg total) by mouth daily. 11/06/18  Yes Kerin Perna, NP  aspirin EC 81 MG tablet Take 1 tablet (81 mg total) by mouth daily. Patient not taking: Reported on 12/25/2018 05/08/18   Clent Demark, PA-C  atorvastatin (LIPITOR) 20 MG tablet Take 1 tablet (20 mg total) by mouth daily. Patient not taking: Reported on 08/25/2019 11/20/18   Charlott Rakes, MD  Blood Glucose Monitoring Suppl (TRUE METRIX METER) w/Device KIT Use to check blood sugar up to 3 times daily. Patient not taking: Reported on 12/25/2018 12/20/18    Charlott Rakes, MD  gabapentin (NEURONTIN) 300 MG capsule Take 2 capsules (600 mg total) by mouth 3 (three) times daily. Patient not taking: Reported on 12/25/2018 05/08/18   Clent Demark, PA-C  glucose blood (TRUE METRIX BLOOD GLUCOSE TEST) test strip Use as instructed to check blood sugar up to 3 times daily. Patient not taking: Reported on 12/25/2018 12/20/18   Charlott Rakes, MD  metFORMIN (GLUCOPHAGE) 500 MG tablet Take 2 tablets (1,000 mg total) by mouth 2 (two) times daily with a meal. Patient not taking: Reported on 08/25/2019 11/06/18   Kerin Perna, NP  TRUEplus Lancets 28G MISC Use as instructed to check blood sugar up to 3 times daily. Patient not taking: Reported on 12/25/2018 12/20/18   Charlott Rakes, MD    Allergies    Patient has no known allergies.  Review of Systems   Review of Systems  Physical Exam Updated Vital Signs BP (!) 127/93   Pulse 86   Temp 99.4 F (37.4 C) (Oral)   Resp 18   SpO2 97%   Physical Exam Constitutional:      Comments: Patient is alert and nontoxic.  Mental status clear.  No respiratory distress.  HENT:     Head: Normocephalic and atraumatic.  Eyes:     Extraocular Movements: Extraocular movements intact.     Conjunctiva/sclera: Conjunctivae normal.  Cardiovascular:     Rate and Rhythm: Normal rate and regular rhythm.  Pulmonary:     Effort: Pulmonary effort is normal.     Breath sounds: Normal breath sounds.  Abdominal:     General: There is no distension.     Palpations: Abdomen is soft.     Tenderness: There is no abdominal tenderness. There is no guarding.     Comments: No inguinal lymphadenopathy.  No tenderness of the groin or high thigh.  Musculoskeletal:     Comments: Patient has first and second digit amputation as illustrated.  There is a fluctuant, fluid-filled blister that is continuous with a soft, fluctuant pocket beneath the callus that is illustrated in the images.  Patient has some mild to moderate tenderness  to palpation of the soft tissues of the anterior lower leg.  No specific streaking.  Skin:    General: Skin is warm and dry.  Neurological:     General: No focal deficit present.     Mental Status: He is oriented to person, place, and time.     Coordination: Coordination normal.  Psychiatric:        Mood and Affect: Mood normal.           ED Results / Procedures / Treatments   Labs (all labs ordered are listed, but only abnormal results are displayed) Labs Reviewed  BASIC METABOLIC PANEL - Abnormal; Notable for the following components:  Result Value   Sodium 133 (*)    Chloride 95 (*)    Glucose, Bld 390 (*)    All other components within normal limits  CBC - Abnormal; Notable for the following components:   WBC 11.2 (*)    Platelets 146 (*)    All other components within normal limits  CBG MONITORING, ED - Abnormal; Notable for the following components:   Glucose-Capillary 391 (*)    All other components within normal limits  CULTURE, BLOOD (ROUTINE X 2)  CULTURE, BLOOD (ROUTINE X 2)  RESPIRATORY PANEL BY RT PCR (FLU A&B, COVID)  AEROBIC CULTURE (SUPERFICIAL SPECIMEN)  LACTIC ACID, PLASMA  URINALYSIS, ROUTINE W REFLEX MICROSCOPIC  LACTIC ACID, PLASMA  HEPATIC FUNCTION PANEL  BLOOD GAS, VENOUS  HEMOGLOBIN A1C    EKG None  Radiology DG Foot Complete Right  Result Date: 08/25/2019 CLINICAL DATA:  59 year old male with a history of right foot tenderness after amputation EXAM: RIGHT FOOT COMPLETE - 3+ VIEW COMPARISON:  07/03/2017 FINDINGS: Surgical changes of prior right great toe and second toe amputation. No acute fracture. No erosive changes. No subcutaneous gas. Soft tissue swelling on the dorsum of the forefoot. No radiopaque foreign body. Degenerative changes of the interphalangeal joints. IMPRESSION: Negative for acute bony abnormality. Surgical changes of the right foot of prior first and second toe amputation. Given the history, correlation with  noninvasive testing may be useful, as resting pain may be secondary to CLI/arterial insufficiency. Electronically Signed   By: Corrie Mckusick D.O.   On: 08/25/2019 13:22    Procedures Procedures (including critical care time)  Medications Ordered in ED Medications  piperacillin-tazobactam (ZOSYN) IVPB 3.75 g (has no administration in time range)  0.9 %  sodium chloride infusion ( Intravenous New Bag/Given 08/25/19 1334)  insulin aspart (novoLOG) injection 10 Units (10 Units Intravenous Given 08/25/19 1335)    ED Course  I have reviewed the triage vital signs and the nursing notes.  Pertinent labs & imaging results that were available during my care of the patient were reviewed by me and considered in my medical decision making (see chart for details).  Clinical Course as of Aug 24 1344  Mon Aug 25, 2019  1345 Consult: Reviewed with Dr. Sharol Given.  Okay to proceed with incision and collect wound fluid for culture.  He will see the patient in consultation   [MP]    Clinical Course User Index [MP] Charlesetta Shanks, MD   MDM Rules/Calculators/A&P                     Antibiotics initiated for diabetic foot wound.  Concern for developing abscess with cellulitis.  Plan for admission.  Final Clinical Impression(s) / ED Diagnoses Final diagnoses:  Diabetic foot infection ()    Rx / [place] Orders ED Discharge Orders    None       Charlesetta Shanks, MD 09/01/19 [phone]

## 2019-08-25 NOTE — Care Management (Signed)
Received consult for home health , DME , medication needs, will continue to follow for plan of care and PT/OT recommendations.

## 2019-08-26 DIAGNOSIS — L089 Local infection of the skin and subcutaneous tissue, unspecified: Secondary | ICD-10-CM

## 2019-08-26 DIAGNOSIS — L02611 Cutaneous abscess of right foot: Secondary | ICD-10-CM

## 2019-08-26 DIAGNOSIS — E11628 Type 2 diabetes mellitus with other skin complications: Secondary | ICD-10-CM

## 2019-08-26 DIAGNOSIS — E785 Hyperlipidemia, unspecified: Secondary | ICD-10-CM

## 2019-08-26 DIAGNOSIS — E1169 Type 2 diabetes mellitus with other specified complication: Secondary | ICD-10-CM

## 2019-08-26 LAB — HIV ANTIBODY (ROUTINE TESTING W REFLEX): HIV Screen 4th Generation wRfx: NONREACTIVE — AB

## 2019-08-26 LAB — BASIC METABOLIC PANEL
Anion gap: 10 (ref 5–15)
BUN: 8 mg/dL (ref 6–20)
CO2: 26 mmol/L (ref 22–32)
Calcium: 9.2 mg/dL (ref 8.9–10.3)
Chloride: 99 mmol/L (ref 98–111)
Creatinine, Ser: 0.86 mg/dL (ref 0.61–1.24)
GFR calc Af Amer: >60 mL/min (ref >60)
GFR calc non Af Amer: >60 mL/min (ref >60)
Glucose, Bld: 321 mg/dL — ABNORMAL HIGH (ref 70–99)
Potassium: 4.1 mmol/L (ref 3.5–5.1)
Sodium: 135 mmol/L (ref 135–145)

## 2019-08-26 LAB — GLUCOSE, CAPILLARY
Glucose-Capillary: 281 mg/dL — ABNORMAL HIGH (ref 70–99)
Glucose-Capillary: 334 mg/dL — ABNORMAL HIGH (ref 70–99)
Glucose-Capillary: 258 mg/dL — ABNORMAL HIGH (ref 70–99)
Glucose-Capillary: 216 mg/dL — ABNORMAL HIGH (ref 70–99)

## 2019-08-26 MED ORDER — INSULIN ASPART 100 UNIT/ML ~~LOC~~ SOLN
4.0000 [IU] | Freq: Three times a day (TID) | SUBCUTANEOUS | Status: DC
  Administered 2019-08-26 – 2019-08-27 (×3): 4 [IU] via SUBCUTANEOUS

## 2019-08-26 MED ORDER — ENOXAPARIN SODIUM 60 MG/0.6ML ~~LOC~~ SOLN
0.5000 mg/kg | SUBCUTANEOUS | Status: DC
  Administered 2019-08-26 – 2019-08-27 (×2): 60 mg via SUBCUTANEOUS
  Filled 2019-08-26 (×2): qty 0.6

## 2019-08-26 NOTE — TOC Initial Note (Signed)
Transition of Care [facility]) - Initial/Assessment Note    Patient Details  Name: Spencer Robles MRN: [id number] Date of Birth: 1961-01-18  Transition of [facility]) CM/SW Contact:    Marilu Favre, RN Phone Number: 08/26/2019, 4:54 PM  Clinical Narrative:                 Confirmed face sheet information with patient. Patient from home alone. Has a 4 point cane.   Patient has Medicaid , co pay's around $3 will change to [facility] pharmacy .   Patient stated he use to go to [place], however since Dr Altamease Oiler has left he goes to [facility] and was told patient will continue going  [place] and be seen by Juluis Mire. [place] scheduled appointment for September 18, 2019 at 1430 . Patient aware and placed on AVS   Expected Discharge Plan: Home/Self Care     Patient Goals and CMS Choice Patient states their goals for this hospitalization and ongoing recovery are:: to return to home CMS Medicare.gov Compare Post Acute Care list provided to:: Patient Choice offered to / list presented to : NA  Expected Discharge Plan and Services Expected Discharge Plan: Home/Self Care   Discharge Planning Services: CM Consult, Medication Assistance, Follow-up appt scheduled   Living arrangements for the past 2 months: Apartment                 DME Arranged: N/A         HH Arranged: NA          Prior Living Arrangements/Services Living arrangements for the past 2 months: Apartment Lives with:: Self Patient language and need for interpreter reviewed:: Yes Do you feel safe going back to the place where you live?: Yes      Need for Family Participation in Patient Care: No (Comment) Care giver support system in place?: No (comment) Current home services: DME Criminal Activity/Legal Involvement Pertinent to Current Situation/Hospitalization: No - Comment as needed  Activities  of Daily Living Home Assistive Devices/Equipment: Cane (specify quad or straight), CBG Meter ADL Screening (condition at time of admission) Patient's cognitive ability adequate to safely complete daily activities?: Yes Is the patient deaf or have difficulty hearing?: No Does the patient have difficulty seeing, even when wearing glasses/contacts?: No Does the patient have difficulty concentrating, remembering, or making decisions?: No Patient able to express need for assistance with ADLs?: Yes Does the patient have difficulty dressing or bathing?: No Independently performs ADLs?: Yes (appropriate for developmental age) Does the patient have difficulty walking or climbing stairs?: Yes Weakness of Legs: Both Weakness of Arms/Hands: None  Permission Sought/Granted   Permission granted to share information with : No              Emotional Assessment Appearance:: Appears stated age Attitude/Demeanor/Rapport: Engaged Affect (typically observed): Accepting Orientation: : Oriented to Self, Oriented to Place, Oriented to  Time, Oriented to Situation Alcohol / Substance Use: Not Applicable Psych Involvement: No (comment)  Admission diagnosis:  Diabetic foot ([place]) [E11.8] Diabetic foot infection ([place]) [id number], L08.9] Patient Active Problem List   Diagnosis Date Noted  . Cutaneous abscess of right foot   . Chronic pain of left knee 03/05/2018  . Scrotal abscess 08/30/2017  . Abscess 08/30/2017  . Epididymoorchitis 08/15/2017  . Great toe amputation status, right 07/24/2017  . Diabetic foot infection ([place])   . Subacute osteomyelitis, right ankle and foot ([place])   .  Diabetic foot ([place]) 07/03/2017  . Diabetes mellitus type 2, uncomplicated ([place]) [id number]  . Prehypertension 04/15/2015   PCP:  Kerin Perna, NP Pharmacy:   [facility], [place] [address] [place] [address] Phone: [phone] Fax:  [phone]     Social Determinants of Health (SDOH) Interventions    Readmission Risk Interventions No flowsheet data found.

## 2019-08-26 NOTE — Progress Notes (Addendum)
Inpatient Diabetes Program Recommendations  AACE/ADA: New Consensus Statement on Inpatient Glycemic Control (2015)  Target Ranges:  Prepandial:   less than 140 mg/dL      Peak postprandial:   less than 180 mg/dL (1-2 hours)      Critically ill patients:  140 - 180 mg/dL   Lab Results  Component Value Date   GLUCAP 334 (H) 08/26/2019   HGBA1C 14.3 (H) 08/25/2019    Review of Glycemic Control  Results for Spencer Robles, Spencer Robles (MRN [id number]) as of 08/26/2019 14:03  Ref. Range 08/25/2019 16:51 08/25/2019 20:21 08/26/2019 07:33 08/26/2019 11:29  Glucose-Capillary Latest Ref Range: 70 - 99 mg/dL 261 (H) 191 (H) 281 (H) 334 (H)    Diabetes history: DM2 Outpatient Diabetes medications: Lantus 10 units daily + Trajenta 5 mg daily +                                                        Metformin 1000 mg twice daily Current orders for Inpatient glycemic control: Novolog 4 units TID with meals +                                                                                     Novolog 0-15 units TID + 0-5 units QHS                                                                          + Lantus 15 units daily + Trajenta 5 mg                                                                           daily  Note: Spoke with patient at the bedside.  Reviewed patient's current A1c of 14.3% (average blood sugar >300 mg/dl) . Explained what a A1c is and what it measures. Also reviewed goal A1c with patient, importance of good glucose control @ home, and blood sugar goals.  Patient has not been taking his diabetes medications for at least 3 months.  He states the insulin is not good for his nerves.  He also states it dehydrates him.  He states a Actor informed him of this information.  He also admits that he was having difficulty reaching the pharmacy for refills at the [place] and was not able to get any refills.  He stopped checking his blood sugar around the same time.  He is  currently applying for disability.  He states he uses natural sugars such as date syrup, agave syrup etc.  Educated patient on limiting these because they will increase his blood sugar.  He does not want to drink any diet drinks because he is afraid of the effects of aspartame.  We talked about Stevia and [place] and he states he likes to use those as well.  Mentioned Zevia soft drinks and he states he really enjoys those.  Reviewed "plate method".    He states he is going to start taking his medications as prescribed and understands the long term risks of not taking these medications.  We reviewed hypoglycemia and treatment.  Encouraged him to call MD if he notices several blood sugars below 100mg /dl and/or greater than 200mg /dl.  TOC is working with patient to assist with establishing a PCP.  He states he is happy to follow up with [facility] at discharge.     MD please order a new glucometer at discharge.  Order [id number]  Thank you, Reche Dixon, RN, BSN Diabetes Coordinator Inpatient Diabetes Program [phone] (team pager from 8a-5p)

## 2019-08-26 NOTE — Consult Note (Signed)
ORTHOPAEDIC CONSULTATION  REQUESTING PHYSICIAN: Modena Jansky, MD  Chief Complaint: Abscess ulceration right foot.  HPI: Spencer Robles is a 59 y.o. male who presents with acute abscess ulceration right foot.  Patient is 2 years status post a right first and second ray amputation in December 2018.  Patient states he has an acute swelling and ulceration which has advanced rapidly.  Past Medical History:  Diagnosis Date  . Diabetes mellitus without complication ([place])   . Epididymitis 08/15/2017  . Hx of necrotising fasciitis 06/2017   RLE/notes 08/15/2017  . Scrotal swelling    left; due to Epididymitis/notes 08/15/2017  . Sleep apnea    does not use a cpap   Past Surgical History:  Procedure Laterality Date  . AMPUTATION TOE Right 07/06/2017   Procedure: RIGHT GREAT TOE AND SECOND TOE AMPUTATION;  Surgeon: Newt Minion, MD;  Location: [facility];  Service: Orthopedics;  Laterality: Right;  . CIRCUMCISION    . MASS BIOPSY  ~ 2008   scrotum/notes 08/15/2017  . SCROTAL EXPLORATION N/A 08/21/2017   Procedure: INCISION AND DRAINAGE SCROTAL ABSCESS, CYSTOSCOPY, COMPLEX INSERTION OF URETHERAL FOLEY CATHETER;  Surgeon: Raynelle Bring, MD;  Location: [facility];  Service: Urology;  Laterality: N/A;  . SCROTAL EXPLORATION N/A 08/30/2017   Procedure: SCROTUM DEBRIDEMENT;  Surgeon: Raynelle Bring, MD;  Location: [facility];  Service: Urology;  Laterality: N/A;   Social History   Socioeconomic History  . Marital status: Married    Spouse name: Not on file  . Number of children: Not on file  . Years of education: Not on file  . Highest education level: Not on file  Occupational History  . Not on file  Tobacco Use  . Smoking status: Never Smoker  . Smokeless tobacco: Never Used  Substance and Sexual Activity  . Alcohol use: No    Alcohol/week: 0.0 standard drinks  . Drug use: No  . Sexual activity: Not on file  Other Topics Concern  . Not on file  Social History Narrative   ** Merged  History Encounter **       Social Determinants of Health   Financial Resource Strain:   . Difficulty of Paying Living Expenses: Not on file  Food Insecurity:   . Worried About Charity fundraiser in the Last Year: Not on file  . Ran Out of Food in the Last Year: Not on file  Transportation Needs:   . Lack of Transportation (Medical): Not on file  . Lack of Transportation (Non-Medical): Not on file  Physical Activity:   . Days of Exercise per Week: Not on file  . Minutes of Exercise per Session: Not on file  Stress:   . Feeling of Stress : Not on file  Social Connections:   . Frequency of Communication with Friends and Family: Not on file  . Frequency of Social Gatherings with Friends and Family: Not on file  . Attends Religious Services: Not on file  . Active Member of Clubs or Organizations: Not on file  . Attends Archivist Meetings: Not on file  . Marital Status: Not on file   History reviewed. No pertinent family history. - negative except otherwise stated in the family history section No Known Allergies Prior to Admission medications   Medication Sig Start Date End Date Taking? Authorizing Provider  ibuprofen (ADVIL) 800 MG tablet Take 800 mg by mouth every 6 (six) hours as needed for mild pain.    Yes [provider]  Insulin Glargine (LANTUS SOLOSTAR) 100 UNIT/ML Solostar Pen Inject 10 Units into the skin daily. 11/06/18  Yes Edwards, Michelle P, NP  sitaGLIPtin (JANUVIA) 100 MG tablet Take 1 tablet (100 mg total) by mouth daily. 11/06/18  Yes Edwards, Michelle P, NP  aspirin EC 81 MG tablet Take 1 tablet (81 mg total) by mouth daily. Patient not taking: Reported on 12/25/2018 05/08/18   Gomez, Roger David, PA-C  atorvastatin (LIPITOR) 20 MG tablet Take 1 tablet (20 mg total) by mouth daily. Patient not taking: Reported on 08/25/2019 11/20/18   Newlin, Enobong, MD  Blood Glucose Monitoring Suppl (TRUE METRIX METER) w/Device KIT Use to check blood sugar up to 3  times daily. Patient not taking: Reported on 12/25/2018 12/20/18   Newlin, Enobong, MD  gabapentin (NEURONTIN) 300 MG capsule Take 2 capsules (600 mg total) by mouth 3 (three) times daily. Patient not taking: Reported on 12/25/2018 05/08/18   Gomez, Roger David, PA-C  glucose blood (TRUE METRIX BLOOD GLUCOSE TEST) test strip Use as instructed to check blood sugar up to 3 times daily. Patient not taking: Reported on 12/25/2018 12/20/18   Newlin, Enobong, MD  metFORMIN (GLUCOPHAGE) 500 MG tablet Take 2 tablets (1,000 mg total) by mouth 2 (two) times daily with a meal. Patient not taking: Reported on 08/25/2019 11/06/18   Edwards, Michelle P, NP  TRUEplus Lancets 28G MISC Use as instructed to check blood sugar up to 3 times daily. Patient not taking: Reported on 12/25/2018 12/20/18   Newlin, Enobong, MD   DG Foot Complete Right  Result Date: 08/25/2019 CLINICAL DATA:  58-year-old male with a history of right foot tenderness after amputation EXAM: RIGHT FOOT COMPLETE - 3+ VIEW COMPARISON:  07/03/2017 FINDINGS: Surgical changes of prior right great toe and second toe amputation. No acute fracture. No erosive changes. No subcutaneous gas. Soft tissue swelling on the dorsum of the forefoot. No radiopaque foreign body. Degenerative changes of the interphalangeal joints. IMPRESSION: Negative for acute bony abnormality. Surgical changes of the right foot of prior first and second toe amputation. Given the history, correlation with noninvasive testing may be useful, as resting pain may be secondary to CLI/arterial insufficiency. Electronically Signed   By: Jaime  Wagner D.O.   On: 08/25/2019 13:22   - pertinent xrays, CT, MRI studies were reviewed and independently interpreted  Positive ROS: All other systems have been reviewed and were otherwise negative with the exception of those mentioned in the HPI and as above.  Physical Exam: General: Alert, no acute distress Psychiatric: Patient is competent for consent with  normal mood and affect Lymphatic: No axillary or cervical lymphadenopathy Cardiovascular: No pedal edema Respiratory: No cyanosis, no use of accessory musculature GI: No organomegaly, abdomen is soft and non-tender    Images:  @ENCIMAGES@  Labs:  Lab Results  Component Value Date   HGBA1C 14.3 (H) 08/25/2019   HGBA1C 13.0 (A) 11/06/2018   HGBA1C 6.0 (A) 05/08/2018   ESRSEDRATE 7 08/25/2019   ESRSEDRATE 17 05/08/2018   ESRSEDRATE 70 (H) 07/03/2017   CRP 2 05/08/2018   CRP 12.9 (H) 07/03/2017   CRP 14.2 (H) 07/03/2017   REPTSTATUS PENDING 08/25/2019   GRAMSTAIN  08/21/2017    RARE WBC PRESENT, PREDOMINANTLY PMN RARE GRAM POSITIVE COCCI IN CLUSTERS RARE GRAM VARIABLE ROD    CULT NO GROWTH < 24 HOURS 08/25/2019   LABORGA METHICILLIN RESISTANT STAPHYLOCOCCUS AUREUS 08/21/2017    Lab Results  Component Value Date   ALBUMIN 4.1 08/25/2019     ALBUMIN 4.9 12/20/2018   ALBUMIN 4.7 05/08/2018   PREALBUMIN 8.5 (L) 07/03/2017    Neurologic: Patient does not have protective sensation bilateral lower extremities.   MUSCULOSKELETAL:   Skin: Examination patient has venous swelling in both legs.  He has no ascending cellulitis he has a large blister ulcer on the plantar and medial aspect of the right foot.  Patient has a good dorsalis pedis pulse.  Review of the radiographs shows no destructive bony changes no air in the soft tissue.  After informed consent patient's right foot was prepped using Betadine paint.  A suture removal kit was used to unroofed the ulcer and blister.  There was a large purulent abscess cultures were obtained.  The remainder of the skin was removed this left a ulcer that was 2 x 3 cm in diameter 1 mm deep.  The ulcer did not probe to bone.  4 x 4 gauze was applied.  Patient's hemoglobin A1c is 14.3.  Patient did have a previous culture 2 years ago of MRSA.  Assessment: Assessment: Abscess right foot does not communicate with bone.  Plan: New deep  cultures obtained.  Would continue IV antibiotics until culture sensitivities are obtained and then discharged on 2 weeks of oral antibiotics.  I will follow-up in the office in 1 week.  Patient may be weightbearing as tolerated on the right foot.  Thank you for the consult and the opportunity to see Mr. Bulow  Marcus Duda, MD [facility] [phone] 7:32 AM     

## 2019-08-26 NOTE — Progress Notes (Signed)
PROGRESS NOTE   Spencer Robles  [id number]    DOB: Nov 26, 1960    DOA: 08/25/2019  PCP: Clent Demark, PA-C   I have briefly reviewed patients previous medical records in [facility].  Chief Complaint:   Chief Complaint  Patient presents with  . Foot Pain    Brief Narrative:  59 year old single male, lives alone, at times ambulates with the help of a cane, PMH of type II/IDDM, diabetic peripheral neuropathy, s/p right first and second toe amputations for diabetic foot infection, hyperlipidemia, OSA not on CPAP, medical noncompliance, admitted to [facility] on 2/1 due to a blister on his right forefoot at site of amputation site with associated pain, swelling and some redness of his forefoot and shin. Admitted for diabetic foot infection/abscess, orthopedics/Dr. Sharol Given consulted and s/p bedside I&D on 2/1.   Assessment & Plan:  Active Problems:   Diabetic foot ([place])   Cutaneous abscess of right foot   Right diabetic foot infection/abscess: Complicating poorly controlled DM with peripheral neuropathy secondary to noncompliance. Right foot x-ray on admission negative for osteomyelitis. Due to prior history of MRSA, patient started on broad-spectrum IV antibiotics including vancomycin and Zosyn. Orthopedic/Dr. Sharol Given consulted and patient underwent bedside I&D/unroofing of the right foot abscess, culture sent. He recommends continuing IV antibiotics until culture sensitivities are back and then discharged on 2 weeks of oral antibiotics, weightbearing as tolerated on right foot, follow-up in office in 1 week. Blood cultures x2: Negative to date.  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 Patient stopped taking insulin because he read on the Internet that it "dried up his nerves" and quit taking Metformin as well. Counseled extensively regarding importance of compliance with all aspects of medical care otherwise he is at high  risk for multiple serious and even life-threatening complications. He verbalized understanding and indicated that he will comply with medical regimen. Diabetes coordinator consulted. On admission started on Lantus 15 units daily, NovoLog SSI/moderate sensitivity. Add mealtime NovoLog. Adjust insulins as needed. Diabetic diet. Also on Metformin and linagliptin here.  Thrombocytopenia: May be related to acute infection. Follow CBC in a.m.  Hyperlipidemia: Continue statins.  Body mass index is 36.9 kg/m./Obesity  Medical noncompliance: Discussion as above.   DVT prophylaxis: Lovenox Code Status: Full Family Communication: None at bedside Disposition:  . Patient came from: Home           . Anticipated d/c place: Home . Barriers to d/c: None. Patient has acute diabetic foot infection complicating prior amputation, poorly controlled diabetes and is at high risk for worsening of limb threatening infection that requires a couple days of IV antibiotics and inpatient monitoring prior to safe discharge home on oral antibiotics.   Consultants:   Orthopedic  Procedures:   I&D of right foot abscess 2/1  Antimicrobials:   IV vancomycin and Zosyn 2/1 >   Subjective:  Patient reports some lack of sensation in both his feet. Mild pain in his right foot. Denies any complaints. States that he has glucometer and testing supplies at home. Does not have PCP.  Objective:   Vitals:   08/25/19 1430 08/25/19 1618 08/25/19 2019 08/26/19 0457  BP: 135/83 129/78 130/77 121/71  Pulse: 90 75 73 68  Resp:  20 18 18   Temp:  98.2 F (36.8 C) 98.4 F (36.9 C) 98.2 F (36.8 C)  TempSrc:  Oral Oral Oral  SpO2: 94% 98% 97% 96%  Weight:  General exam: Middle-age male, moderately built and obese sitting up comfortably in bed without distress Respiratory system: Clear to auscultation. Respiratory effort normal. Cardiovascular system: S1 & S2 heard, RRR. No JVD, murmurs, rubs, gallops or clicks. No  pedal edema. Gastrointestinal system: Abdomen is nondistended, soft and nontender. No organomegaly or masses felt. Normal bowel sounds heard. Central nervous system: Alert and oriented. No focal neurological deficits. Extremities: Symmetric 5 x 5 power. Right lower extremity from ankle distally is mildly swollen, warm with faint patchy redness. S/p right first and second toe amputated. Removed postop dressing and noted 2 x 3 cm diameter superficial ulcer that is s/p unroofing of abscess/blister by orthopedics. Not draining at this time and no acute findings. Skin: No rashes, lesions or ulcers Psychiatry: Judgement and insight appear normal. Mood & affect appropriate.     Data Reviewed:   I have personally reviewed following labs and imaging studies   CBC: Recent Labs  Lab 08/25/19 1204 08/25/19 1358  WBC 11.2*  --   HGB 15.0 15.0  HCT 44.0 44.0  MCV 83.7  --   PLT 146*  --     Basic Metabolic Panel: Recent Labs  Lab 08/25/19 1204 08/25/19 1358 08/26/19 0225  NA 133* 132* 135  K 4.3 4.1 4.1  CL 95*  --  99  CO2 24  --  26  GLUCOSE 390*  --  321*  BUN 11  --  8  CREATININE 0.97  --  0.86  CALCIUM 9.8  --  9.2    Liver Function Tests: Recent Labs  Lab 08/25/19 1333  AST 25  ALT 17  ALKPHOS 96  BILITOT 1.3*  PROT 7.3  ALBUMIN 4.1    CBG: Recent Labs  Lab 08/25/19 2021 08/26/19 0733 08/26/19 1129  GLUCAP 191* 281* 334*    Microbiology Studies:   Recent Results (from the past 240 hour(s))  Culture, blood (routine x 2)     Status: None (Preliminary result)   Collection Time: 08/25/19  1:20 PM   Specimen: BLOOD LEFT FOREARM  Result Value Ref Range Status   Specimen Description BLOOD LEFT FOREARM  Final   Special Requests   Final    BOTTLES DRAWN AEROBIC AND ANAEROBIC Blood Culture adequate volume Performed at [facility], [place] [address], [place], [place] [address]    Culture NO GROWTH < 24 HOURS  Final   Report Status PENDING  Incomplete    Respiratory Panel by RT PCR (Flu A&B, Covid) - Nasopharyngeal Swab     Status: None   Collection Time: 08/25/19  1:37 PM   Specimen: Nasopharyngeal Swab  Result Value Ref Range Status   SARS Coronavirus 2 by RT PCR NEGATIVE NEGATIVE Final    Comment: (NOTE) SARS-CoV-2 target nucleic acids are NOT DETECTED. The SARS-CoV-2 RNA is generally detectable in upper respiratoy specimens during the acute phase of infection. The lowest concentration of SARS-CoV-2 viral copies this assay can detect is 131 copies/mL. A negative result does not preclude SARS-Cov-2 infection and should not be used as the sole basis for treatment or other patient management decisions. A negative result may occur with  improper specimen collection/handling, submission of specimen other than nasopharyngeal swab, presence of viral mutation(s) within the areas targeted by this assay, and inadequate number of viral copies (<131 copies/mL). A negative result must be combined with clinical observations, patient history, and epidemiological information. The expected result is Negative. Fact Sheet for Patients:  PinkCheek.be Fact Sheet for Healthcare Providers:  GravelBags.it This test is not yet ap proved or cleared by the [place] and  has been authorized for detection and/or diagnosis of SARS-CoV-2 by FDA under an Emergency Use Authorization (EUA). This EUA will remain  in effect (meaning this test can be used) for the duration of the COVID-19 declaration under Section 564(b)(1) of the Act, 21 U.S.C. section 360bbb-3(b)(1), unless the authorization is terminated or revoked sooner.    Influenza A by PCR NEGATIVE NEGATIVE Final   Influenza B by PCR NEGATIVE NEGATIVE Final    Comment: (NOTE) The Xpert Xpress SARS-CoV-2/FLU/RSV assay is intended as an aid in  the diagnosis of influenza from Nasopharyngeal swab specimens and  should not be used as a sole  basis for treatment. Nasal washings and  aspirates are unacceptable for Xpert Xpress SARS-CoV-2/FLU/RSV  testing. Fact Sheet for Patients: PinkCheek.be Fact Sheet for Healthcare Providers: GravelBags.it This test is not yet approved or cleared by the [place] FDA and  has been authorized for detection and/or diagnosis of SARS-CoV-2 by  FDA under an Emergency Use Authorization (EUA). This EUA will remain  in effect (meaning this test can be used) for the duration of the  Covid-19 declaration under Section 564(b)(1) of the Act, 21  U.S.C. section 360bbb-3(b)(1), unless the authorization is  terminated or revoked. Performed at [facility], [place] [address] [place], [place] [address]   Culture, blood (routine x 2)     Status: None (Preliminary result)   Collection Time: 08/25/19  1:48 PM   Specimen: BLOOD  Result Value Ref Range Status   Specimen Description BLOOD RIGHT ANTECUBITAL  Final   Special Requests   Final    BOTTLES DRAWN AEROBIC ONLY Blood Culture results may not be optimal due to an inadequate volume of blood received in culture bottles Performed at [place] [address], [place], [place] [address]    Culture NO GROWTH < 24 HOURS  Final   Report Status PENDING  Incomplete     Radiology Studies:  DG Foot Complete Right  Result Date: 08/25/2019 CLINICAL DATA:  59 year old male with a history of right foot tenderness after amputation EXAM: RIGHT FOOT COMPLETE - 3+ VIEW COMPARISON:  07/03/2017 FINDINGS: Surgical changes of prior right great toe and second toe amputation. No acute fracture. No erosive changes. No subcutaneous gas. Soft tissue swelling on the dorsum of the forefoot. No radiopaque foreign body. Degenerative changes of the interphalangeal joints. IMPRESSION: Negative for acute bony abnormality. Surgical changes of the right foot of prior first and second toe amputation. Given the  history, correlation with noninvasive testing may be useful, as resting pain may be secondary to CLI/arterial insufficiency. Electronically Signed   By: Corrie Mckusick D.O.   On: 08/25/2019 13:22     Scheduled Meds:   . aspirin EC  81 mg Oral Daily  . atorvastatin  20 mg Oral Daily  . enoxaparin (LOVENOX) injection  0.5 mg/kg Subcutaneous Q24H  . gabapentin  600 mg Oral TID  . insulin aspart  0-15 Units Subcutaneous TID WC  . insulin aspart  0-5 Units Subcutaneous QHS  . insulin glargine  15 Units Subcutaneous Daily  . linagliptin  5 mg Oral Daily  . metFORMIN  1,000 mg Oral BID WC    Continuous Infusions:   . piperacillin-tazobactam (ZOSYN)  IV 3.375 g (08/26/19 0631)  . vancomycin 750 mg (08/26/19 0526)     LOS: 1 day     Vernell Leep, MD,  FACP, SFHM. [facility]    To contact the attending provider between 7A-7P or the covering provider during after hours 7P-7A, please log into the web site [email or web address] and access using universal [place] password for that web site. If you do not have the password, please call the hospital operator.  08/26/2019, 11:37 AM

## 2019-08-27 LAB — CBC
HCT: 39.7 % (ref 39.0–52.0)
Hemoglobin: 13.3 g/dL (ref 13.0–17.0)
MCH: 28.1 pg (ref 26.0–34.0)
MCHC: 33.5 g/dL (ref 30.0–36.0)
MCV: 83.9 fL (ref 80.0–100.0)
Platelets: 125 10*3/uL — ABNORMAL LOW (ref 150–400)
RBC: 4.73 MIL/uL (ref 4.22–5.81)
RDW: 12.8 % (ref 11.5–15.5)
WBC: 8.1 10*3/uL (ref 4.0–10.5)
nRBC: 0 % (ref 0.0–0.2)

## 2019-08-27 LAB — VANCOMYCIN, PEAK: Vancomycin Pk: 18 ug/mL — ABNORMAL LOW (ref 30–40)

## 2019-08-27 LAB — GLUCOSE, CAPILLARY
Glucose-Capillary: 201 mg/dL — ABNORMAL HIGH (ref 70–99)
Glucose-Capillary: 246 mg/dL — ABNORMAL HIGH (ref 70–99)
Glucose-Capillary: 199 mg/dL — ABNORMAL HIGH (ref 70–99)
Glucose-Capillary: 214 mg/dL — ABNORMAL HIGH (ref 70–99)

## 2019-08-27 LAB — VANCOMYCIN, TROUGH: Vancomycin Tr: 13 ug/mL — ABNORMAL LOW (ref 15–20)

## 2019-08-27 MED ORDER — INSULIN ASPART 100 UNIT/ML ~~LOC~~ SOLN
8.0000 [IU] | Freq: Three times a day (TID) | SUBCUTANEOUS | Status: DC
  Administered 2019-08-27 – 2019-08-28 (×4): 8 [IU] via SUBCUTANEOUS

## 2019-08-27 MED ORDER — INSULIN GLARGINE 100 UNIT/ML ~~LOC~~ SOLN
20.0000 [IU] | Freq: Every day | SUBCUTANEOUS | Status: DC
  Administered 2019-08-28: 20 [IU] via SUBCUTANEOUS
  Filled 2019-08-27: qty 0.2

## 2019-08-27 NOTE — Plan of Care (Signed)
  Problem: Pain Managment: Goal: General experience of comfort will improve Outcome: Progressing   Problem: Safety: Goal: Ability to remain free from injury will improve Outcome: Progressing   Problem: Skin Integrity: Goal: Risk for impaired skin integrity will decrease Outcome: Progressing   

## 2019-08-27 NOTE — Progress Notes (Signed)
Foot less painful per patient.  Dressing removed. Healthy granulation tissue over area of ulcer. No fluctulence. No cellulitis or foul odor.   Bedside culture. Gr + cocci and rods.  Hopefully home on oral antibiotic with folow up with Dr. Sharol Given in office in 1 week.

## 2019-08-27 NOTE — Progress Notes (Signed)
PROGRESS NOTE    Spencer Robles  [id number] DOB: 1961-03-31 DOA: 08/25/2019 PCP: Kerin Perna, NP    Brief Narrative:  59 year old single male, lives alone, at times ambulates with the help of a cane, PMH of type II/IDDM, diabetic peripheral neuropathy, s/p right first and second toe amputations for diabetic foot infection, hyperlipidemia, OSA not on CPAP, medical noncompliance, admitted to [facility] on 2/1 due to a blister on his right forefoot at site of amputation site with associated pain, swelling and some redness of his forefoot and shin. Admitted for diabetic foot infection/abscess, orthopedics/Dr. Sharol Given consulted and s/p bedside I&D on 2/1.   Assessment & Plan:   Active Problems:   Diabetic foot ([place])   Cutaneous abscess of right foot  Right diabetic foot infection/abscess: Complicating poorly controlled DM with peripheral neuropathy secondary to noncompliance. Right foot x-ray on admission negative for osteomyelitis. Due to prior history of MRSA, patient started on broad-spectrum IV antibiotics including vancomycin and Zosyn. Orthopedic/Dr. Sharol Given consulted and patient underwent bedside I&D/unroofing of the right foot abscess, culture sent. He recommends continuing IV antibiotics until culture sensitivities are back and then discharged on 2 weeks of oral antibiotics, weightbearing as tolerated on right foot, follow-up in office in 1 week. Blood cultures x2: Negative to date. Awaiting ID on wound culture.  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 Patient stopped taking insulin because he read on the Internet that it "dried up his nerves" and quit taking Metformin as well. Counseled extensively regarding importance of compliance with all aspects of medical care otherwise he is at high risk for multiple serious and even life-threatening complications. He verbalized understanding and indicated that he will comply with  medical regimen. Diabetes coordinator consulted. On admission started on Lantus 15 units daily, NovoLog SSI/moderate sensitivity. Add mealtime NovoLog. Adjust insulins as needed. Diabetic diet. Also on Metformin and linagliptin here.--Increased Lantus and Novolog with meals today  Thrombocytopenia: May be related to acute infection. Follow CBC in a.m.--148-->125--will trend  Hyperlipidemia: Continue statins.  Body mass index is 36.9 kg/m./Obesity  Medical noncompliance: Discussion as above.  DVT prophylaxis: Lovenox SQ Code Status: Full code  Family Communication: Patient only Disposition Plan: to home, awaiting cultures.    Consultants:   Ortho  Procedures:  I & D of right foot abscess 2/1  Antimicrobials: Anti-infectives (From admission, onward)   Start     Dose/Rate Route Frequency Ordered Stop   08/25/19 2300  vancomycin (VANCOREADY) IVPB 750 mg/150 mL     750 mg 150 mL/hr over 60 Minutes Intravenous Every 8 hours 08/25/19 1433     08/25/19 2200  piperacillin-tazobactam (ZOSYN) IVPB 3.375 g     3.375 g 12.5 mL/hr over 240 Minutes Intravenous Every 8 hours 08/25/19 1425     08/25/19 1415  vancomycin (VANCOREADY) IVPB 1750 mg/350 mL     1,750 mg 175 mL/hr over 120 Minutes Intravenous  Once 08/25/19 1413 08/25/19 1941   08/25/19 1345  piperacillin-tazobactam (ZOSYN) IVPB 3.75 g     3.75 g 111.1 mL/hr over 30 Minutes Intravenous  Once 08/25/19 1333 08/25/19 1413       Subjective: Reports pain in both feet, including phantom pain.  Objective: Vitals:   08/26/19 0457 08/26/19 1305 08/26/19 2028 08/27/19 0507  BP: 121/71 117/69 120/70 118/70  Pulse: 68 74 70 61  Resp: 18 20 18 14   Temp: 98.2 F (36.8 C) 98.7 F (37.1 C) 98.2 F (36.8 C) 97.7 F (36.5 C)  TempSrc: Oral Oral Oral Oral  SpO2: 96% 97% 97% 94%  Weight:        Intake/Output Summary (Last 24 hours) at 08/27/2019 1138 Last data filed at 08/27/2019 0900 Gross per 24 hour  Intake 718 ml  Output  --  Net 718 ml   Filed Weights   08/25/19 1420  Weight: 120 kg    Examination:  General exam: Appears calm and comfortable  Respiratory system:  Respiratory effort normal. Cardiovascular system:  RRR.  Gastrointestinal system: Abdomen is nondistended, soft and nontender.  Central nervous system: Alert and oriented. No focal neurological deficits. Extremities: Per wound care--wound looks good today Skin: No rashes Psychiatry: Judgement and insight appear normal. Mood & affect appropriate.     Data Reviewed: I have personally reviewed following labs and imaging studies  CBC: Recent Labs  Lab 08/25/19 1204 08/25/19 1358 08/27/19 0211  WBC 11.2*  --  8.1  HGB 15.0 15.0 13.3  HCT 44.0 44.0 39.7  MCV 83.7  --  83.9  PLT 146*  --  0000000*   Basic Metabolic Panel: Recent Labs  Lab 08/25/19 1204 08/25/19 1358 08/26/19 0225  NA 133* 132* 135  K 4.3 4.1 4.1  CL 95*  --  99  CO2 24  --  26  GLUCOSE 390*  --  321*  BUN 11  --  8  CREATININE 0.97  --  0.86  CALCIUM 9.8  --  9.2   GFR: CrCl cannot be calculated (Unknown ideal weight.). Liver Function Tests: Recent Labs  Lab 08/25/19 1333  AST 25  ALT 17  ALKPHOS 96  BILITOT 1.3*  PROT 7.3  ALBUMIN 4.1   HbA1C: Recent Labs    08/25/19 1333  HGBA1C 14.3*   CBG: Recent Labs  Lab 08/26/19 0733 08/26/19 1129 08/26/19 1704 08/26/19 2030 08/27/19 0756  GLUCAP 281* 334* 258* 216* 201*   Sepsis Labs: Recent Labs  Lab 08/25/19 1205 08/25/19 1333  LATICACIDVEN 1.5 1.5    Recent Results (from the past 240 hour(s))  Culture, blood (routine x 2)     Status: None (Preliminary result)   Collection Time: 08/25/19  1:20 PM   Specimen: BLOOD LEFT FOREARM  Result Value Ref Range Status   Specimen Description BLOOD LEFT FOREARM  Final   Special Requests   Final    BOTTLES DRAWN AEROBIC AND ANAEROBIC Blood Culture adequate volume   Culture   Final    NO GROWTH 2 DAYS Performed at [facility], [place] [address], [place], [place] [address]    Report Status PENDING  Incomplete  Respiratory Panel by RT PCR (Flu A&B, Covid) - Nasopharyngeal Swab     Status: None   Collection Time: 08/25/19  1:37 PM   Specimen: Nasopharyngeal Swab  Result Value Ref Range Status   SARS Coronavirus 2 by RT PCR NEGATIVE NEGATIVE Final    Comment: (NOTE) SARS-CoV-2 target nucleic acids are NOT DETECTED. The SARS-CoV-2 RNA is generally detectable in upper respiratoy specimens during the acute phase of infection. The lowest concentration of SARS-CoV-2 viral copies this assay can detect is 131 copies/mL. A negative result does not preclude SARS-Cov-2 infection and should not be used as the sole basis for treatment or other patient management decisions. A negative result may occur with  improper specimen collection/handling, submission of specimen other than nasopharyngeal swab, presence of viral mutation(s) within the areas targeted by this assay, and inadequate number of viral copies (<131 copies/mL). A negative result must  be combined with clinical observations, patient history, and epidemiological information. The expected result is Negative. Fact Sheet for Patients:  PinkCheek.be Fact Sheet for Healthcare Providers:  GravelBags.it This test is not yet ap proved or cleared by the [place] FDA and  has been authorized for detection and/or diagnosis of SARS-CoV-2 by FDA under an Emergency Use Authorization (EUA). This EUA will remain  in effect (meaning this test can be used) for the duration of the COVID-19 declaration under Section 564(b)(1) of the Act, 21 U.S.C. section 360bbb-3(b)(1), unless the authorization is terminated or revoked sooner.    Influenza A by PCR NEGATIVE NEGATIVE Final   Influenza B by PCR NEGATIVE NEGATIVE Final    Comment: (NOTE) The Xpert Xpress SARS-CoV-2/FLU/RSV assay is intended as an aid in  the diagnosis of influenza  from Nasopharyngeal swab specimens and  should not be used as a sole basis for treatment. Nasal washings and  aspirates are unacceptable for Xpert Xpress SARS-CoV-2/FLU/RSV  testing. Fact Sheet for Patients: PinkCheek.be Fact Sheet for Healthcare Providers: GravelBags.it This test is not yet approved or cleared by the [place] FDA and  has been authorized for detection and/or diagnosis of SARS-CoV-2 by  FDA under an Emergency Use Authorization (EUA). This EUA will remain  in effect (meaning this test can be used) for the duration of the  Covid-19 declaration under Section 564(b)(1) of the Act, 21  U.S.C. section 360bbb-3(b)(1), unless the authorization is  terminated or revoked. Performed at [facility], [place] [address], [place], [place] [address]   Culture, blood (routine x 2)     Status: None (Preliminary result)   Collection Time: 08/25/19  1:48 PM   Specimen: BLOOD  Result Value Ref Range Status   Specimen Description BLOOD RIGHT ANTECUBITAL  Final   Special Requests   Final    BOTTLES DRAWN AEROBIC ONLY Blood Culture results may not be optimal due to an inadequate volume of blood received in culture bottles   Culture   Final    NO GROWTH 2 DAYS Performed at [facility], [place] [address] [place], [place] [address]    Report Status PENDING  Incomplete  Wound or Superficial Culture     Status: None (Preliminary result)   Collection Time: 08/26/19  7:35 AM   Specimen: Abscess; Wound  Result Value Ref Range Status   Specimen Description ABSCESS FOOT  Final   Special Requests NONE  Final   Gram Stain   Final    RARE WBC PRESENT,BOTH PMN AND MONONUCLEAR FEW GRAM POSITIVE COCCI IN PAIRS FEW GRAM POSITIVE RODS Performed at [facility], [place] [address] [place],  [address]    Culture PENDING  Incomplete   Report Status PENDING  Incomplete      Radiology Studies: DG Foot Complete  Right  Result Date: 08/25/2019 CLINICAL DATA:  59 year old male with a history of right foot tenderness after amputation EXAM: RIGHT FOOT COMPLETE - 3+ VIEW COMPARISON:  07/03/2017 FINDINGS: Surgical changes of prior right great toe and second toe amputation. No acute fracture. No erosive changes. No subcutaneous gas. Soft tissue swelling on the dorsum of the forefoot. No radiopaque foreign body. Degenerative changes of the interphalangeal joints. IMPRESSION: Negative for acute bony abnormality. Surgical changes of the right foot of prior first and second toe amputation. Given the history, correlation with noninvasive testing may be useful, as resting pain may be secondary to CLI/arterial insufficiency. Electronically Signed   By: Corrie Mckusick  D.O.   On: 08/25/2019 13:22     Scheduled Meds: . aspirin EC  81 mg Oral Daily  . atorvastatin  20 mg Oral Daily  . enoxaparin (LOVENOX) injection  0.5 mg/kg Subcutaneous Q24H  . gabapentin  600 mg Oral TID  . insulin aspart  0-15 Units Subcutaneous TID WC  . insulin aspart  0-5 Units Subcutaneous QHS  . insulin aspart  8 Units Subcutaneous TID WC  . [START ON 08/28/2019] insulin glargine  20 Units Subcutaneous Daily  . linagliptin  5 mg Oral Daily  . metFORMIN  1,000 mg Oral BID WC   Continuous Infusions: . piperacillin-tazobactam (ZOSYN)  IV 3.375 g (08/27/19 [id number])  . vancomycin 750 mg (08/27/19 0546)     LOS: 2 days    Donnamae Jude, MD 08/27/2019 11:38 AM [phone] [facility] If 7PM-7AM, please contact night-coverage 08/27/2019, 11:38 AM

## 2019-08-28 ENCOUNTER — Other Ambulatory Visit (HOSPITAL_COMMUNITY): Payer: Self-pay | Admitting: Internal Medicine

## 2019-08-28 DIAGNOSIS — Z794 Long term (current) use of insulin: Secondary | ICD-10-CM

## 2019-08-28 DIAGNOSIS — E119 Type 2 diabetes mellitus without complications: Secondary | ICD-10-CM

## 2019-08-28 DIAGNOSIS — Z9114 Patient's other noncompliance with medication regimen: Secondary | ICD-10-CM

## 2019-08-28 DIAGNOSIS — E118 Type 2 diabetes mellitus with unspecified complications: Secondary | ICD-10-CM

## 2019-08-28 LAB — CBC
HCT: 41.1 % (ref 39.0–52.0)
Hemoglobin: 13.5 g/dL (ref 13.0–17.0)
MCH: 28 pg (ref 26.0–34.0)
MCHC: 32.8 g/dL (ref 30.0–36.0)
MCV: 85.1 fL (ref 80.0–100.0)
Platelets: 125 10*3/uL — ABNORMAL LOW (ref 150–400)
RBC: 4.83 MIL/uL (ref 4.22–5.81)
RDW: 12.8 % (ref 11.5–15.5)
WBC: 7.5 10*3/uL (ref 4.0–10.5)
nRBC: 0 % (ref 0.0–0.2)

## 2019-08-28 LAB — AEROBIC CULTURE W GRAM STAIN (SUPERFICIAL SPECIMEN)

## 2019-08-28 LAB — GLUCOSE, CAPILLARY
Glucose-Capillary: 227 mg/dL — ABNORMAL HIGH (ref 70–99)
Glucose-Capillary: 201 mg/dL — ABNORMAL HIGH (ref 70–99)
Glucose-Capillary: 137 mg/dL — ABNORMAL HIGH (ref 70–99)

## 2019-08-28 MED ORDER — TRUE METRIX METER W/DEVICE KIT: PACK | 0 refills | Status: AC

## 2019-08-28 MED ORDER — SITAGLIPTIN PHOSPHATE 100 MG PO TABS: 100.0000 mg | ORAL_TABLET | Freq: Every day | ORAL | 0 refills | Status: DC

## 2019-08-28 MED ORDER — DOXYCYCLINE MONOHYDRATE 100 MG PO TABS: 100.0000 mg | ORAL_TABLET | Freq: Two times a day (BID) | ORAL | 0 refills | Status: AC

## 2019-08-28 MED ORDER — GABAPENTIN 300 MG PO CAPS: 600.0000 mg | ORAL_CAPSULE | Freq: Three times a day (TID) | ORAL | 5 refills | Status: DC

## 2019-08-28 MED ORDER — CEFDINIR 300 MG PO CAPS: 300.0000 mg | ORAL_CAPSULE | Freq: Two times a day (BID) | ORAL | 0 refills | Status: AC

## 2019-08-28 MED ORDER — TRUE METRIX BLOOD GLUCOSE TEST VI STRP: ORAL_STRIP | 11 refills | Status: AC

## 2019-08-28 MED ORDER — METFORMIN HCL 500 MG PO TABS: 1000.0000 mg | ORAL_TABLET | Freq: Two times a day (BID) | ORAL | 0 refills | Status: DC

## 2019-08-28 MED ORDER — ATORVASTATIN CALCIUM 20 MG PO TABS: 20.0000 mg | ORAL_TABLET | Freq: Every day | ORAL | 0 refills | Status: DC

## 2019-08-28 MED ORDER — ASPIRIN EC 81 MG PO TBEC: 81.0000 mg | DELAYED_RELEASE_TABLET | Freq: Every day | ORAL | 0 refills | Status: AC

## 2019-08-28 MED ORDER — LANTUS SOLOSTAR 100 UNIT/ML ~~LOC~~ SOPN: 10.0000 [IU] | PEN_INJECTOR | Freq: Every day | SUBCUTANEOUS | 99 refills | Status: DC

## 2019-08-28 MED ORDER — TRUEPLUS LANCETS 28G MISC: 11 refills | Status: AC

## 2019-08-28 MED FILL — GABAPENTIN 300 MG CAPSULE: 300 | 30 days supply | Qty: 180 | Fill #0

## 2019-08-28 MED FILL — LANTUS SOLOSTAR 100 UNITS/M: 100 | 28 days supply | Qty: 3 | Fill #0

## 2019-08-28 MED FILL — JANUVIA 100 MG TABLET: 100 | 30 days supply | Qty: 30 | Fill #0

## 2019-08-28 MED FILL — DOXYCYCLINE HYCLATE 100 MG: 100 | 14 days supply | Qty: 28 | Fill #0

## 2019-08-28 MED FILL — ACCU-CHEK GUIDE W/DEVICE KI: W/DEVICE | 1 days supply | Qty: 1 | Fill #0

## 2019-08-28 MED FILL — metFORMIN HCL 500 MG TABS: 500 | 30 days supply | Qty: 120 | Fill #0

## 2019-08-28 MED FILL — ACCU-CHEK SOFTCLIX LANCETS: 30 days supply | Qty: 100 | Fill #0

## 2019-08-28 MED FILL — ATORVASTATIN CALCIUM 20 MG: 20 | 30 days supply | Qty: 30 | Fill #0

## 2019-08-28 MED FILL — CEFDINIR 300 MG CAPSULE: 300 | 14 days supply | Qty: 28 | Fill #0

## 2019-08-28 MED FILL — ACCU-CHEK GUIDE TEST STRIP: 30 days supply | Qty: 100 | Fill #0

## 2019-08-28 MED FILL — ASPIRIN 81 MG TBEC: 81 | 90 days supply | Qty: 90 | Fill #0

## 2019-08-28 MED FILL — PENTIPS 31G X 8 MM MISC: 31G X 8 MM | 30 days supply | Qty: 100 | Fill #0

## 2019-08-28 NOTE — Discharge Instructions (Signed)
Diabetes Mellitus and Foot Care Foot care is an important part of your health, especially when you have diabetes. Diabetes may cause you to have problems because of poor blood flow (circulation) to your feet and legs, which can cause your skin to:  Become thinner and drier.  Break more easily.  Heal more slowly.  Peel and crack. You may also have nerve damage (neuropathy) in your legs and feet, causing decreased feeling in them. This means that you may not notice minor injuries to your feet that could lead to more serious problems. Noticing and addressing any potential problems early is the best way to prevent future foot problems. How to care for your feet Foot hygiene  Wash your feet daily with warm water and mild soap. Do not use hot water. Then, pat your feet and the areas between your toes until they are completely dry. Do not soak your feet as this can dry your skin.  Trim your toenails straight across. Do not dig under them or around the cuticle. File the edges of your nails with an emery board or nail file.  Apply a moisturizing lotion or petroleum jelly to the skin on your feet and to dry, brittle toenails. Use lotion that does not contain alcohol and is unscented. Do not apply lotion between your toes. Shoes and socks  Wear clean socks or stockings every day. Make sure they are not too tight. Do not wear knee-high stockings since they may decrease blood flow to your legs.  Wear shoes that fit properly and have enough cushioning. Always look in your shoes before you put them on to be sure there are no objects inside.  To break in new shoes, wear them for just a few hours a day. This prevents injuries on your feet. Wounds, scrapes, corns, and calluses  Check your feet daily for blisters, cuts, bruises, sores, and redness. If you cannot see the bottom of your feet, use a mirror or ask someone for help.  Do not cut corns or calluses or try to remove them with medicine.  If you  find a minor scrape, cut, or break in the skin on your feet, keep it and the skin around it clean and dry. You may clean these areas with mild soap and water. Do not clean the area with peroxide, alcohol, or iodine.  If you have a wound, scrape, corn, or callus on your foot, look at it several times a day to make sure it is healing and not infected. Check for: ? Redness, swelling, or pain. ? Fluid or blood. ? Warmth. ? Pus or a bad smell. General instructions  Do not cross your legs. This may decrease blood flow to your feet.  Do not use heating pads or hot water bottles on your feet. They may burn your skin. If you have lost feeling in your feet or legs, you may not know this is happening until it is too late.  Protect your feet from hot and cold by wearing shoes, such as at the beach or on hot pavement.  Schedule a complete foot exam at least once a year (annually) or more often if you have foot problems. If you have foot problems, report any cuts, sores, or bruises to your health care provider immediately. Contact a health care provider if:  You have a medical condition that increases your risk of infection and you have any cuts, sores, or bruises on your feet.  You have an injury that is not   healing.  You have redness on your legs or feet.  You feel burning or tingling in your legs or feet.  You have pain or cramps in your legs and feet.  Your legs or feet are numb.  Your feet always feel cold.  You have pain around a toenail. Get help right away if:  You have a wound, scrape, corn, or callus on your foot and: ? You have pain, swelling, or redness that gets worse. ? You have fluid or blood coming from the wound, scrape, corn, or callus. ? Your wound, scrape, corn, or callus feels warm to the touch. ? You have pus or a bad smell coming from the wound, scrape, corn, or callus. ? You have a fever. ? You have a red line going up your leg. Summary  Check your feet every day  for cuts, sores, red spots, swelling, and blisters.  Moisturize feet and legs daily.  Wear shoes that fit properly and have enough cushioning.  If you have foot problems, report any cuts, sores, or bruises to your health care provider immediately.  Schedule a complete foot exam at least once a year (annually) or more often if you have foot problems. This information is not intended to replace advice given to you by your health care provider. Make sure you discuss any questions you have with your health care provider. Document Revised: 04/02/2019 Document Reviewed: 08/11/2016 Elsevier Patient Education  [place].   Diabetes Basics  Diabetes (diabetes mellitus) is a long-term (chronic) disease. It occurs when the body does not properly use sugar (glucose) that is released from food after you eat. Diabetes may be caused by one or both of these problems:  Your pancreas does not make enough of a hormone called insulin.  Your body does not react in a normal way to insulin that it makes. Insulin lets sugars (glucose) go into cells in your body. This gives you energy. If you have diabetes, sugars cannot get into cells. This causes high blood sugar (hyperglycemia). Follow these instructions at home: How is diabetes treated? You may need to take insulin or other diabetes medicines daily to keep your blood sugar in balance. Take your diabetes medicines every day as told by your doctor. List your diabetes medicines here: Diabetes medicines  Name of medicine: ______________________________ ? Amount (dose): _______________ Time (a.m./p.m.): _______________ Notes: ___________________________________  Name of medicine: ______________________________ ? Amount (dose): _______________ Time (a.m./p.m.): _______________ Notes: ___________________________________  Name of medicine: ______________________________ ? Amount (dose): _______________ Time (a.m./p.m.): _______________ Notes:  ___________________________________ If you use insulin, you will learn how to give yourself insulin by injection. You may need to adjust the amount based on the food that you eat. List the types of insulin you use here: Insulin  Insulin type: ______________________________ ? Amount (dose): _______________ Time (a.m./p.m.): _______________ Notes: ___________________________________  Insulin type: ______________________________ ? Amount (dose): _______________ Time (a.m./p.m.): _______________ Notes: ___________________________________  Insulin type: ______________________________ ? Amount (dose): _______________ Time (a.m./p.m.): _______________ Notes: ___________________________________  Insulin type: ______________________________ ? Amount (dose): _______________ Time (a.m./p.m.): _______________ Notes: ___________________________________  Insulin type: ______________________________ ? Amount (dose): _______________ Time (a.m./p.m.): _______________ Notes: ___________________________________ How do I manage my blood sugar?  Check your blood sugar levels using a blood glucose monitor as directed by your doctor. Your doctor will set treatment goals for you. Generally, you should have these blood sugar levels:  Before meals (preprandial): 80-130 mg/dL (4.4-7.2 mmol/L).  After meals (postprandial): below 180 mg/dL (10 mmol/L).  A1c level: less than 7%.  Write down the times that you will check your blood sugar levels: Blood sugar checks  Time: _______________ Notes: ___________________________________  Time: _______________ Notes: ___________________________________  Time: _______________ Notes: ___________________________________  Time: _______________ Notes: ___________________________________  Time: _______________ Notes: ___________________________________  Time: _______________ Notes: ___________________________________  What do I need to know about low blood sugar? Low  blood sugar is called hypoglycemia. This is when blood sugar is at or below 70 mg/dL (3.9 mmol/L). Symptoms may include:  Feeling: ? Hungry. ? Worried or nervous (anxious). ? Sweaty and clammy. ? Confused. ? Dizzy. ? Sleepy. ? Sick to your stomach (nauseous).  Having: ? A fast heartbeat. ? A headache. ? A change in your vision. ? Tingling or no feeling (numbness) around the mouth, lips, or tongue. ? Jerky movements that you cannot control (seizure).  Having trouble with: ? Moving (coordination). ? Sleeping. ? Passing out (fainting). ? Getting upset easily (irritability). Treating low blood sugar To treat low blood sugar, eat or drink something sugary right away. If you can think clearly and swallow safely, follow the 15:15 rule:  Take 15 grams of a fast-acting carb (carbohydrate). Talk with your doctor about how much you should take.  Some fast-acting carbs are: ? Sugar tablets (glucose pills). Take 3-4 glucose pills. ? 6-8 pieces of hard candy. ? 4-6 oz (120-150 mL) of fruit juice. ? 4-6 oz (120-150 mL) of regular (not diet) soda. ? 1 Tbsp (15 mL) honey or sugar.  Check your blood sugar 15 minutes after you take the carb.  If your blood sugar is still at or below 70 mg/dL (3.9 mmol/L), take 15 grams of a carb again.  If your blood sugar does not go above 70 mg/dL (3.9 mmol/L) after 3 tries, get help right away.  After your blood sugar goes back to normal, eat a meal or a snack within 1 hour. Treating very low blood sugar If your blood sugar is at or below 54 mg/dL (3 mmol/L), you have very low blood sugar (severe hypoglycemia). This is an emergency. Do not wait to see if the symptoms will go away. Get medical help right away. Call your local emergency services (911 in the [place] Do not drive yourself to the hospital. Questions to ask your health care provider  Do I need to meet with a diabetes educator?  What equipment will I need to care for myself at home?  What  diabetes medicines do I need? When should I take them?  How often do I need to check my blood sugar?  What number can I call if I have questions?  When is my next doctor's visit?  Where can I find a support group for people with diabetes? Where to find more information  American Diabetes Association: www.diabetes.org  American Association of Diabetes Educators: www.diabeteseducator.org/patient-resources Contact a doctor if:  Your blood sugar is at or above 240 mg/dL (13.3 mmol/L) for 2 days in a row.  You have been sick or have had a fever for 2 days or more, and you are not getting better.  You have any of these problems for more than 6 hours: ? You cannot eat or drink. ? You feel sick to your stomach (nauseous). ? You throw up (vomit). ? You have watery poop (diarrhea). Get help right away if:  Your blood sugar is lower than 54 mg/dL (3 mmol/L).  You get confused.  You have trouble: ? Thinking clearly. ? Breathing. Summary  Diabetes (diabetes mellitus) is a long-term (chronic) disease.  It occurs when the body does not properly use sugar (glucose) that is released from food after digestion.  Take insulin and diabetes medicines as told.  Check your blood sugar every day, as often as told.  Keep all follow-up visits as told by your doctor. This is important. This information is not intended to replace advice given to you by your health care provider. Make sure you discuss any questions you have with your health care provider. Document Revised: 04/02/2019 Document Reviewed: 10/12/2017 Elsevier Patient Education  [place].

## 2019-08-28 NOTE — TOC Progression Note (Signed)
Transition of Care [facility]) - Progression Note    Patient Details  Name: Ferrel Walser MRN: [id number] Date of Birth: 1960/12/31  Transition of [facility] CM/SW Contact  Jacalyn Lefevre Edson Snowball, RN Phone Number: 08/28/2019, 3:07 PM  Clinical Narrative:    Patient needs new quad cane, ordered same from [place] with [facility] [place].   Called every home health agency listed for patient's address, none are able to accept referral for HHPT due to staffing.   Discussed OP PT with patient. Patient agreeable. Orders entered    Expected Discharge Plan: Home/Self Care    Expected Discharge Plan and Services Expected Discharge Plan: Home/Self Care   Discharge Planning Services: CM Consult, Medication Assistance, Follow-up appt scheduled   Living arrangements for the past 2 months: Apartment Expected Discharge Date: 08/28/19               DME Arranged: N/A         HH Arranged: NA           Social Determinants of Health (SDOH) Interventions    Readmission Risk Interventions No flowsheet data found.

## 2019-08-28 NOTE — Evaluation (Signed)
Physical Therapy Evaluation Patient Details Name: Spencer Robles MRN: [id number] DOB: 11-18-1960 Today's Date: 08/28/2019   History of Present Illness  Pt is a 59 y/o male admitted secondary to R diabetic foot infection. Pt is s/p I and D. 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   Clinical Impression  Pt admitted secondary to problem above with deficits below. Required min guard for ambulation using quad cane. Pt refusing to use RW. Feel pt would benefit from use of post op shoe on the R, as he reports his shoes are too tight. Also requesting another quad cane as the caps are worn. Reports neighbors check on him frequently. Will continue to follow acutely to maximize functional mobility independence and safety.     Follow Up Recommendations Home health PT;Supervision for mobility/OOB    Equipment Recommendations  Other (comment)(quad cane and post op shoe)    Recommendations for Other Services       Precautions / Restrictions Precautions Precautions: Fall Restrictions Weight Bearing Restrictions: Yes RLE Weight Bearing: Weight bearing as tolerated      Mobility  Bed Mobility Overal bed mobility: Modified Independent                Transfers Overall transfer level: Needs assistance Equipment used: Quad cane Transfers: Sit to/from Stand Sit to Stand: Min guard         General transfer comment: Min guard for safety. REquired use of momentum to stand.   Ambulation/Gait Ambulation/Gait assistance: Min guard Gait Distance (Feet): 40 Feet Assistive device: Quad cane Gait Pattern/deviations: Step-through pattern;Decreased stride length;Decreased weight shift to right;Antalgic Gait velocity: Decreased   General Gait Details: Mildly antalgic gait. Min guard for safety. Pt refusing to use RW and wanting to use quad cane instead.   Stairs            Wheelchair Mobility    Modified Rankin (Stroke Patients Only)       Balance Overall  balance assessment: Needs assistance Sitting-balance support: No upper extremity supported;Feet supported Sitting balance-Leahy Scale: Good     Standing balance support: Single extremity supported;During functional activity Standing balance-Leahy Scale: Fair Standing balance comment: Able to maintain static standing without UE support                              Pertinent Vitals/Pain Pain Assessment: Faces Faces Pain Scale: Hurts a little bit Pain Location: R foot  Pain Descriptors / Indicators: Grimacing;Guarding Pain Intervention(s): Limited activity within patient's tolerance;Monitored during session;Repositioned    Home Living Family/patient expects to be discharged to:: Private residence Living Arrangements: Alone Available Help at Discharge: Neighbor;Family;Available PRN/intermittently Type of Home: House Home Access: Level entry     Home Layout: One level Home Equipment: Cane - quad      Prior Function Level of Independence: Independent with assistive device(s)         Comments: Reports sometimes uses quad cane     Hand Dominance        Extremity/Trunk Assessment   Upper Extremity Assessment Upper Extremity Assessment: Overall WFL for tasks assessed    Lower Extremity Assessment Lower Extremity Assessment: Generalized weakness;RLE deficits/detail RLE Deficits / Details: Deficits consistent with post op pain and weakness.     Cervical / Trunk Assessment Cervical / Trunk Assessment: Normal  Communication   Communication: No difficulties  Cognition Arousal/Alertness: Awake/alert Behavior During Therapy: WFL for tasks assessed/performed Overall Cognitive Status: No  family/caregiver present to determine baseline cognitive functioning                                        General Comments General comments (skin integrity, edema, etc.): Pt refusing RW at home and requesting another quad cane as caps are missing from the  end. Also discussed use of post op shoe as pt reports his shoes at home are too tight over his incision.     Exercises     Assessment/Plan    PT Assessment Patient needs continued PT services  PT Problem List Decreased strength;Decreased balance;Decreased mobility;Decreased knowledge of precautions;Decreased knowledge of use of DME       PT Treatment Interventions DME instruction;Gait training;Functional mobility training;Therapeutic activities;Therapeutic exercise;Balance training;Patient/family education    PT Goals (Current goals can be found in the Care Plan section)  Acute Rehab PT Goals Patient Stated Goal: to go home PT Goal Formulation: With patient Time For Goal Achievement: 09/11/19 Potential to Achieve Goals: Good    Frequency Min 3X/week   Barriers to discharge Decreased caregiver support Reports neighbors can check in on him     Co-evaluation               AM-PAC PT "6 Clicks" Mobility  Outcome Measure Help needed turning from your back to your side while in a flat bed without using bedrails?: None Help needed moving from lying on your back to sitting on the side of a flat bed without using bedrails?: None Help needed moving to and from a bed to a chair (including a wheelchair)?: A Little Help needed standing up from a chair using your arms (e.g., wheelchair or bedside chair)?: A Little Help needed to walk in hospital room?: A Little Help needed climbing 3-5 steps with a railing? : A Lot 6 Click Score: 19    End of Session   Activity Tolerance: Patient tolerated treatment well Patient left: in bed;with call bell/phone within reach(sitting EOB ) Nurse Communication: Mobility status PT Visit Diagnosis: Unsteadiness on feet (R26.81);Difficulty in walking, not elsewhere classified (R26.2);Pain Pain - Right/Left: Right Pain - part of body: Ankle and joints of foot    Time: [id number] PT Time Calculation (min) (ACUTE ONLY): 16 min   Charges:   PT  Evaluation $PT Eval Moderate Complexity: 1 Mod          Reuel Derby, PT, DPT  Acute Rehabilitation Services  Pager: [phone] Office: [phone]   Rudean Hitt 08/28/2019, 12:08 PM

## 2019-08-28 NOTE — Progress Notes (Signed)
Orthopedic Tech Progress Note Patient Details:  Spencer Robles 1961/07/17 [id number]  Ortho Devices Type of Ortho Device: Postop shoe/boot Ortho Device/Splint Location: RLE Ortho Device/Splint Interventions: Application, Ordered   Post Interventions Patient Tolerated: Well Instructions Provided: Care of device, Adjustment of device   Janit Pagan 08/28/2019, 1:23 PM

## 2019-08-28 NOTE — Progress Notes (Signed)
Prior to d/c, right foot dressing changed. Small, dry, serous fluid observed on gauze dressing. Wound cleansed with sterile saline, new gauze, and ace wrap applied.   Pt d/c at 1500. Discharge paperwork and attached education reviewed with patient. Patient verbalized understanding. Medications provided by [facility] pharmacy sent home with patient. Patient left unit via wheelchair.

## 2019-08-28 NOTE — Progress Notes (Signed)
Pharmacy Antibiotic Note  Spencer Robles is a 59 y.o. male admitted on 08/25/2019 with DFI, WBC 11.2,  LA 1.5.  Pharmacy has been consulted for vancomycin and zosyn dosing.  Patient s/p I&D with plan to continue IV abx until C&S return and then d/c patient home on PO abx. Levels done on vancomycin on 2/3, Peak 18 (true peak 23) and trough 13 (true trough 12.1) with calculated AUC 411 (goal 400-550).   Culutres have returned as MRSA (S-tetracyclines, R-bactrim/clinda) and Klebsiella (R-Amp, cefazolin/keflex)  Plan: Continue Vancomycin 750 mg IV every 8 hours until change to PO abx Continue Zosyn 3.375g IV every 8 hours (extended infusion) until change to PO abx Would recommend doxycycline 100mg  PO BID for MRSA coverage at discharge and Cefdinir 300mg  PO BID for Klebsiella Monitor renal function,    Weight: 264 lb 8.8 oz (120 kg)  Temp (24hrs), Avg:98.1 F (36.7 C), Min:97.5 F (36.4 C), Max:98.8 F (37.1 C)  Recent Labs  Lab 08/25/19 1204 08/25/19 1205 08/25/19 1333 08/26/19 0225 08/27/19 0211 08/27/19 1914 08/27/19 2243 08/28/19 0227  WBC 11.2*  --   --   --  8.1  --   --  7.5  CREATININE 0.97  --   --  0.86  --   --   --   --   LATICACIDVEN  --  1.5 1.5  --   --   --   --   --   VANCOTROUGH  --   --   --   --   --   --  13*  --   VANCOPEAK  --   --   --   --   --  18*  --   --     CrCl cannot be calculated (Unknown ideal weight.).    No Known Allergies  Lon Klippel A. Levada Dy, PharmD, BCPS, FNKF Clinical Pharmacist [place] Please utilize Amion for appropriate phone number to reach the unit pharmacist ([facility])   08/28/2019 9:27 AM

## 2019-08-28 NOTE — Discharge Summary (Signed)
Discharge Summary  Spencer Robles LKG:[id number] DOB: April 29, 1961  PCP: Kerin Perna, NP  Admit date: 08/25/2019 Discharge date: 08/28/2019  Time spent: 44mns, more than 50% time spent on coordination of care.  Recommendations for Outpatient Follow-up:  1. F/u with PCP within a week  for hospital discharge follow up, repeat cbc/bmp at follow up.  2. F/u with ortho Dr DSharol Givenin one week for right foot 3. New meds doxycycline and cefdinir 4. Home DME: post op shoe , quad cane  Discharge Diagnoses:  Active Hospital Problems   Diagnosis Date Noted  . Cutaneous abscess of right foot   . Diabetic foot ([place] 07/03/2017    Resolved Hospital Problems  No resolved problems to display.    Discharge Condition: stable  Diet recommendation: heart healthy/carb modified  Filed Weights   08/25/19 1420  Weight: 120 kg    History of present illness: ( per admitting MD Dr ZRoosevelt Locks PCP: GClent Demark PA-C   Patient coming from: Home  I have personally briefly reviewed patient's old medical records in [facility] Chief Complaint: Right foot swelling and pain  HPI: BBetty Brooksis a 59y.o. 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 No fever or chills. Two days ago, he start to have right shin soreness and he notice the right fore foot on the amputation stump edge developed a blister. He had a DM foot infection on the right foot, 2 years ago when he had to get his first 2 toes amputated. He reports about 4-5 months ago, he himself recently decided to stop insulin after hearing "insulin dries my body"  He further stopped taking Metformin and other meds, he claims that he had hard time to get them refilled. Otherwise, he is felt good. He has not had a fever. He denies any chest pain or cough. No  abdominal pain. No vomiting no diarrhea.  ED Course: DM foot infection suspected and orthopedic surgery informed and will see the patient.  Hospital Course:  Active Problems:   Diabetic foot ([place]   Cutaneous abscess of right foot   Right diabetic foot infection/abscess:  -Complicating poorly controlled DM with peripheral neuropathy secondary to noncompliance.  -Right foot x-ray on admission negative for osteomyelitis. Due to prior history of MRSA, patient started on broad-spectrum IV antibiotics including vancomycin and Zosyn.  -Orthopedic/Dr. DSharol Givenconsulted and patient underwent bedside I&D/unroofing of the right foot abscess. -wound culture + mrsa and klebsiella ornithinolytica, blood culture no growth -Dr DSharol Given recommends  IV antibiotics until culture sensitivities are back and then discharged on 2 weeks of oral antibiotics, weightbearing as tolerated on right foot, follow-up in office in 1 week.  -pT recommended home health PT/post op shoe and quad cane, ordered. -patient is discharged on doxycycline and cefdinir.  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 Patient stopped taking insulin because he read on the Internet that it "dried up his nerves" and quit taking Metformin as well. Counseled extensively regarding importance of compliance with all aspects of medical care otherwise he is at high risk for multiple serious and even life-threatening complications. He verbalized understanding and indicated that he will comply with medical regimen. Diabetes coordinator consulted.  -continue insulie, Metformin and linagliptin  -f/u with pcp  Thrombocytopenia:May be related to acute  infection. Follow CBC in a.m.--148-->125-125 F/u with pcp  Hyperlipidemia:Continue statins.  Body mass index is 36.9 kg/m./Obesity  Medical noncompliance:Discussion as above.  DVT prophylaxis while in the hospital: Lovenox SQ Code  Status: Full code  Family Communication: Patient  Disposition Plan: to home with home health PT, post op shoe and quad cane    Consultants:   Ortho  PT  Case manager  Procedures:  I & D of right foot abscess 2/1  Antimicrobials: Iv vanc /zosyn  Discharge Exam: BP 119/78 (BP Location: Right Arm)   Pulse 62   Temp (!) 97.5 F (36.4 C) (Oral)   Resp 20   Wt 120 kg   SpO2 96%   BMI 36.90 kg/m   General: NAD Cardiovascular: RRR Respiratory: CTABL Right foot wrapped  Discharge Instructions You were cared for by a hospitalist during your hospital stay. If you have any questions about your discharge medications or the care you received while you were in the hospital after you are discharged, you can call the unit and asked to speak with the hospitalist on call if the hospitalist that took care of you is not available. Once you are discharged, your primary care physician will handle any further medical issues. Please note that NO REFILLS for any discharge medications will be authorized once you are discharged, as it is imperative that you return to your primary care physician (or establish a relationship with a primary care physician if you do not have one) for your aftercare needs so that they can reassess your need for medications and monitor your lab values.  Discharge Instructions    Diet - low sodium heart healthy   Complete by: As directed    Diet Carb Modified   Complete by: As directed    Increase activity slowly   Complete by: As directed    weightbearing as tolerated on the right foot   Increase activity slowly   Complete by: As directed      Allergies as of 08/28/2019   No Known Allergies     Medication List    TAKE these medications   aspirin EC 81 MG tablet Take 1 tablet (81 mg total) by mouth daily.   atorvastatin 20 MG tablet Commonly known as: LIPITOR Take 1 tablet (20 mg total) by mouth daily.   cefdinir 300 MG capsule Commonly known as:  OMNICEF Take 1 capsule (300 mg total) by mouth 2 (two) times daily for 14 days.   doxycycline 100 MG tablet Commonly known as: ADOXA Take 1 tablet (100 mg total) by mouth 2 (two) times daily for 14 days.   gabapentin 300 MG capsule Commonly known as: NEURONTIN Take 2 capsules (600 mg total) by mouth 3 (three) times daily.   ibuprofen 800 MG tablet Commonly known as: ADVIL Take 800 mg by mouth every 6 (six) hours as needed for mild pain.   Lantus SoloStar 100 UNIT/ML Solostar Pen Generic drug: Insulin Glargine Inject 10 Units into the skin daily.   metFORMIN 500 MG tablet Commonly known as: GLUCOPHAGE Take 2 tablets (1,000 mg total) by mouth 2 (two) times daily with a meal.   sitaGLIPtin 100 MG tablet Commonly known as: Januvia Take 1 tablet (100 mg total) by mouth daily.   True Metrix Blood Glucose Test test strip Generic drug: glucose blood Use as instructed to check blood sugar up to 3 times daily.   True Metrix Meter w/Device Kit Use to check blood sugar up to 3 times daily.  TRUEplus Lancets 28G Misc Use as instructed to check blood sugar up to 3 times daily.            Durable Medical Equipment  (From admission, onward)         Start     Ordered   08/28/19 1203  For home use only DME Cane  Once    Comments: quad cane   08/28/19 1203         No Known Allergies Follow-up Information    Newt Minion, MD Follow up in 1 week(s).   Specialty: Orthopedic Surgery Why: for right foot Contact information: [place] [address] [phone]        Kerin Perna, NP. Go to.   Specialty: Internal Medicine Why: September 18, 2019 at 2:30 pm  please work with your pcp for diabetes control.  Contact information: [address] [place] [address] [phone]            The results of significant diagnostics from this hospitalization (including imaging, microbiology, ancillary and laboratory) are listed below for  reference.    Significant Diagnostic Studies: DG Foot Complete Right  Result Date: 08/25/2019 CLINICAL DATA:  59 year old male with a history of right foot tenderness after amputation EXAM: RIGHT FOOT COMPLETE - 3+ VIEW COMPARISON:  07/03/2017 FINDINGS: Surgical changes of prior right great toe and second toe amputation. No acute fracture. No erosive changes. No subcutaneous gas. Soft tissue swelling on the dorsum of the forefoot. No radiopaque foreign body. Degenerative changes of the interphalangeal joints. IMPRESSION: Negative for acute bony abnormality. Surgical changes of the right foot of prior first and second toe amputation. Given the history, correlation with noninvasive testing may be useful, as resting pain may be secondary to CLI/arterial insufficiency. Electronically Signed   By: Corrie Mckusick D.O.   On: 08/25/2019 13:22    Microbiology: Recent Results (from the past 240 hour(s))  Culture, blood (routine x 2)     Status: None (Preliminary result)   Collection Time: 08/25/19  1:20 PM   Specimen: BLOOD LEFT FOREARM  Result Value Ref Range Status   Specimen Description BLOOD LEFT FOREARM  Final   Special Requests   Final    BOTTLES DRAWN AEROBIC AND ANAEROBIC Blood Culture adequate volume   Culture   Final    NO GROWTH 3 DAYS Performed at [facility], [place] [address] [place], [place] [address]    Report Status PENDING  Incomplete  Respiratory Panel by RT PCR (Flu A&B, Covid) - Nasopharyngeal Swab     Status: None   Collection Time: 08/25/19  1:37 PM   Specimen: Nasopharyngeal Swab  Result Value Ref Range Status   SARS Coronavirus 2 by RT PCR NEGATIVE NEGATIVE Final    Comment: (NOTE) SARS-CoV-2 target nucleic acids are NOT DETECTED. The SARS-CoV-2 RNA is generally detectable in upper respiratoy specimens during the acute phase of infection. The lowest concentration of SARS-CoV-2 viral copies this assay can detect is 131 copies/mL. A negative result does not preclude  SARS-Cov-2 infection and should not be used as the sole basis for treatment or other patient management decisions. A negative result may occur with  improper specimen collection/handling, submission of specimen other than nasopharyngeal swab, presence of viral mutation(s) within the areas targeted by this assay, and inadequate number of viral copies (<131 copies/mL). A negative result must be combined with clinical observations, patient history, and epidemiological information. The expected result is Negative. Fact Sheet for Patients:  PinkCheek.be Fact Sheet for Healthcare Providers:  GravelBags.it This test is not yet ap proved or cleared by the [place] FDA and  has been authorized for detection and/or diagnosis of SARS-CoV-2 by FDA under an Emergency Use Authorization (EUA). This EUA will remain  in effect (meaning this test can be used) for the duration of the COVID-19 declaration under Section 564(b)(1) of the Act, 21 U.S.C. section 360bbb-3(b)(1), unless the authorization is terminated or revoked sooner.    Influenza A by PCR NEGATIVE NEGATIVE Final   Influenza B by PCR NEGATIVE NEGATIVE Final    Comment: (NOTE) The Xpert Xpress SARS-CoV-2/FLU/RSV assay is intended as an aid in  the diagnosis of influenza from Nasopharyngeal swab specimens and  should not be used as a sole basis for treatment. Nasal washings and  aspirates are unacceptable for Xpert Xpress SARS-CoV-2/FLU/RSV  testing. Fact Sheet for Patients: PinkCheek.be Fact Sheet for Healthcare Providers: GravelBags.it This test is not yet approved or cleared by the [place] FDA and  has been authorized for detection and/or diagnosis of SARS-CoV-2 by  FDA under an Emergency Use Authorization (EUA). This EUA will remain  in effect (meaning this test can be used) for the duration of the  Covid-19  declaration under Section 564(b)(1) of the Act, 21  U.S.C. section 360bbb-3(b)(1), unless the authorization is  terminated or revoked. Performed at [facility], [place] [address] [place], [place] [address]   Culture, blood (routine x 2)     Status: None (Preliminary result)   Collection Time: 08/25/19  1:48 PM   Specimen: BLOOD  Result Value Ref Range Status   Specimen Description BLOOD RIGHT ANTECUBITAL  Final   Special Requests   Final    BOTTLES DRAWN AEROBIC ONLY Blood Culture results may not be optimal due to an inadequate volume of blood received in culture bottles   Culture   Final    NO GROWTH 3 DAYS Performed at [facility], [place] [address], [place], [place] [address]    Report Status PENDING  Incomplete  Wound or Superficial Culture     Status: None   Collection Time: 08/26/19  7:35 AM   Specimen: Abscess; Wound  Result Value Ref Range Status   Specimen Description ABSCESS FOOT  Final   Special Requests NONE  Final   Gram Stain   Final    RARE WBC PRESENT,BOTH PMN AND MONONUCLEAR FEW GRAM POSITIVE COCCI IN PAIRS FEW GRAM POSITIVE RODS Performed at [facility], [place] [address], [place], [place] [address]    Culture   Final    MODERATE METHICILLIN RESISTANT STAPHYLOCOCCUS AUREUS FEW KLEBSIELLA ORNITHINOLYTICA    Report Status 08/28/2019 FINAL  Final   Organism ID, Bacteria METHICILLIN RESISTANT STAPHYLOCOCCUS AUREUS  Final   Organism ID, Bacteria KLEBSIELLA ORNITHINOLYTICA  Final      Susceptibility   Klebsiella ornithinolytica - MIC*    AMPICILLIN >=32 RESISTANT Resistant     CEFAZOLIN >=64 RESISTANT Resistant     CEFEPIME <=0.12 SENSITIVE Sensitive     CEFTAZIDIME <=1 SENSITIVE Sensitive     CEFTRIAXONE 0.5 SENSITIVE Sensitive     CIPROFLOXACIN <=0.25 SENSITIVE Sensitive     GENTAMICIN <=1 SENSITIVE Sensitive     IMIPENEM 0.5 SENSITIVE Sensitive     TRIMETH/SULFA <=20 SENSITIVE Sensitive     AMPICILLIN/SULBACTAM 8 SENSITIVE Sensitive      PIP/TAZO <=4 SENSITIVE Sensitive     * FEW KLEBSIELLA ORNITHINOLYTICA   Methicillin resistant staphylococcus aureus -  MIC*    CIPROFLOXACIN >=8 RESISTANT Resistant     ERYTHROMYCIN >=8 RESISTANT Resistant     GENTAMICIN <=0.5 SENSITIVE Sensitive     OXACILLIN >=4 RESISTANT Resistant     TETRACYCLINE <=1 SENSITIVE Sensitive     VANCOMYCIN 1 SENSITIVE Sensitive     TRIMETH/SULFA >=320 RESISTANT Resistant     CLINDAMYCIN >=8 RESISTANT Resistant     RIFAMPIN <=0.5 SENSITIVE Sensitive     Inducible Clindamycin NEGATIVE Sensitive     * MODERATE METHICILLIN RESISTANT STAPHYLOCOCCUS AUREUS     Labs: Basic Metabolic Panel: Recent Labs  Lab 08/25/19 1204 08/25/19 1358 08/26/19 0225  NA 133* 132* 135  K 4.3 4.1 4.1  CL 95*  --  99  CO2 24  --  26  GLUCOSE 390*  --  321*  BUN 11  --  8  CREATININE 0.97  --  0.86  CALCIUM 9.8  --  9.2   Liver Function Tests: Recent Labs  Lab 08/25/19 1333  AST 25  ALT 17  ALKPHOS 96  BILITOT 1.3*  PROT 7.3  ALBUMIN 4.1   No results for input(s): LIPASE, AMYLASE in the last 168 hours. No results for input(s): AMMONIA in the last 168 hours. CBC: Recent Labs  Lab 08/25/19 1204 08/25/19 1358 08/27/19 0211 08/28/19 0227  WBC 11.2*  --  8.1 7.5  HGB 15.0 15.0 13.3 13.5  HCT 44.0 44.0 39.7 41.1  MCV 83.7  --  83.9 85.1  PLT 146*  --  125* 125*   Cardiac Enzymes: No results for input(s): CKTOTAL, CKMB, CKMBINDEX, TROPONINI in the last 168 hours. BNP: BNP (last 3 results) No results for input(s): BNP in the last 8760 hours.  ProBNP (last 3 results) No results for input(s): PROBNP in the last 8760 hours.  CBG: Recent Labs  Lab 08/27/19 1220 08/27/19 1711 08/27/19 2053 08/28/19 0752 08/28/19 1206  GLUCAP 246* 199* 214* 227* 201*       Signed:  Florencia Reasons MD, PhD, FACP  [facility] 08/28/2019, 12:24 PM

## 2019-08-29 ENCOUNTER — Telehealth: Payer: Self-pay

## 2019-08-29 NOTE — Telephone Encounter (Signed)
Called patient at 33 [phone]. Unable to reach. Left Voice message to call back. Phone number and extension provided.

## 2019-08-30 LAB — CULTURE, BLOOD (ROUTINE X 2)
Culture: NO GROWTH
Culture: NO GROWTH
Special Requests: ADEQUATE

## 2019-09-01 ENCOUNTER — Telehealth: Payer: Self-pay

## 2019-09-01 NOTE — Telephone Encounter (Signed)
Transition Care Management Follow-up Telephone Call  Date of discharge and from where: 08/28/2019, [facility]    How have you been since you were released from the hospital? He stated that he is doing " pretty good."   Any questions or concerns? He would like a referral to ophthalmology as he is concerned about his vision.   Items Reviewed:  Did the pt receive and understand the discharge instructions provided? Yes, he has the instructions and did not have any questions.   Medications obtained and verified? The discharge instructions were reviewed.. he has all medications except for ibuprofen.  He said that he is taking all medications as ordered on the list, including insulin and metformin and januvia and does not have any questions. He confirmed that he is taking the lantus at night. Discussed the need to compliance with his diabetic regime to prevent further complications.   Any new allergies since your discharge?none reported   Dietary orders reviewed?yes, low sodium and carb  modified diet were discussed but he will need addional education. He said he is trying to watch what he is eating   Do you have support at home? He said he is home alone.   Other (ie: DME, Home Health, etc) no home health ordered.   Stated that he has wound care supplies for right foot and explained that he is changing the dressing 1-2 x/day.  Stated that he is cleansing with saline and applying dry gauze and then wrapping. Signs of infection reviewed and he said that there is no sign of infection at this time. Stressed the importance of keeping his appt with Dr Sharol Given on 09/04/2019 and also getting control of his blood sugars and diabetes.   Has a glucometer, said that his blood sugars run 98-270 but sometimes he checks right after eating because he says he is so hungry and eats prior to checking hte blood sugar.  Instructed him to keep a log and note those comments when appropriate.    Functional  Questionnaire: (I = Independent and D = Dependent) ADL's: independent. Has quad cane, post op shoe.  Understands that he should try to keep weight off of his right foot as much as possible - WBAT.    Follow up appointments reviewed:    PCP Hospital f/u appt confirmed? Appointment with Ms Oletta Lamas, NP  09/18/2019 @ 1430.  He was offered and appt for 09/08/2019 but did not want it.Marland Kitchen  [facility] f/u appt confirmed? Dr Sharol Given - 09/04/2019  Are transportation arrangements needed? No.  He may need to take the bus.  Encouraged him to call DSS to register for medicaid transportation to medical appts  If their condition worsens, is the pt aware to call  their PCP or go to the ED?   yes  Was the patient provided with contact information for the PCP's office or ED?he has the phone number for the clinic. He said that he has tried to get through but is never able to get through, he explained that he called months ago as well as recently and the calls would not go through.   Was the pt encouraged to call back with questions or concerns?yes

## 2019-09-04 ENCOUNTER — Other Ambulatory Visit: Payer: Self-pay

## 2019-09-04 ENCOUNTER — Ambulatory Visit (INDEPENDENT_AMBULATORY_CARE_PROVIDER_SITE_OTHER): Payer: Medicaid Other | Admitting: Orthopedic Surgery

## 2019-09-04 ENCOUNTER — Encounter: Payer: Self-pay | Admitting: Orthopedic Surgery

## 2019-09-04 VITALS — Ht 71.0 in | Wt 264.0 lb

## 2019-09-04 DIAGNOSIS — L97511 Non-pressure chronic ulcer of other part of right foot limited to breakdown of skin: Secondary | ICD-10-CM

## 2019-09-04 NOTE — Progress Notes (Signed)
Office Visit Note   Patient: Spencer Robles           Date of Birth: 22-Dec-1960           MRN: [id number] Visit Date: 09/04/2019              Requested by: Kerin Perna, NP [address] [place],  [place] [address] PCP: Kerin Perna, NP  Chief Complaint  Patient presents with  . Right Foot - Follow-up  . Left Foot - Follow-up      HPI: Patient is a 59 year old gentleman who presents for follow-up for both feet he complains of a new ulcer on the plantar aspect of the right foot as well as hypertrophic callus on the left foot.  Assessment & Plan: Visit Diagnoses:  1. Non-pressure chronic ulcer of other part of right foot limited to breakdown of skin ([place])     Plan: Ulcer was debrided of skin and soft tissue on the right foot.  Continue with his protective shoe wear recommended compression stockings for his venous insufficiency.  Follow-Up Instructions: Return in about 3 months (around 12/02/2019).   Ortho Exam  Patient is alert, oriented, no adenopathy, well-dressed, normal affect, normal respiratory effort. Examination patient's feet are plantigrade there is no redness no cellulitis no signs of infection.  Patient has a ulcer on the plantar aspect right foot beneath the first and second metatarsals.  After informed consent a 10 blade knife was used to debride the skin and soft tissue back to healthy viable tissue the ulcer is 2 cm in diameter 1 mm deep.  Examination left foot he has hypertrophic callus on the great toe and second toe this was pared with a 10 blade knife without complications.  Patient is ambulating with a quad cane postoperative shoe on the right.  Imaging: No results found. No images are attached to the encounter.  Labs: Lab Results  Component Value Date   HGBA1C 14.3 (H) 08/25/2019   HGBA1C 13.0 (A) 11/06/2018   HGBA1C 6.0 (A) 05/08/2018   ESRSEDRATE 7 08/25/2019   ESRSEDRATE 17 05/08/2018   ESRSEDRATE 70 (H) 07/03/2017   CRP 2  05/08/2018   CRP 12.9 (H) 07/03/2017   CRP 14.2 (H) 07/03/2017   REPTSTATUS 08/28/2019 FINAL 08/26/2019   GRAMSTAIN  08/26/2019    RARE WBC PRESENT,BOTH PMN AND MONONUCLEAR FEW GRAM POSITIVE COCCI IN PAIRS FEW GRAM POSITIVE RODS Performed at [facility], [place] [address], [place], [place] [address]    CULT  08/26/2019    MODERATE METHICILLIN RESISTANT STAPHYLOCOCCUS AUREUS FEW KLEBSIELLA ORNITHINOLYTICA    LABORGA METHICILLIN RESISTANT STAPHYLOCOCCUS AUREUS 08/26/2019   LABORGA KLEBSIELLA ORNITHINOLYTICA 08/26/2019     Lab Results  Component Value Date   ALBUMIN 4.1 08/25/2019   ALBUMIN 4.9 12/20/2018   ALBUMIN 4.7 05/08/2018   PREALBUMIN 8.5 (L) 07/03/2017    Lab Results  Component Value Date   MG 1.7 05/08/2018   No results found for: [facility]  Lab Results  Component Value Date   PREALBUMIN 8.5 (L) 07/03/2017   CBC EXTENDED Latest Ref Rng & Units 08/28/2019 08/27/2019 08/25/2019  WBC 4.0 - 10.5 K/uL 7.5 8.1 -  RBC 4.22 - 5.81 MIL/uL 4.83 4.73 -  HGB 13.0 - 17.0 g/dL 13.5 13.3 15.0  HCT 39.0 - 52.0 % 41.1 39.7 44.0  PLT 150 - 400 K/uL 125(L) 125(L) -  NEUTROABS 1.7 - 7.7 K/uL - - -  LYMPHSABS 0.7 - 4.0 K/uL - - -  Body mass index is 36.82 kg/m.  Orders:  No orders of the defined types were placed in this encounter.  No orders of the defined types were placed in this encounter.    Procedures: No procedures performed  Clinical Data: No additional findings.  ROS:  All other systems negative, except as noted in the HPI. Review of Systems  Objective: Vital Signs: Ht 5\' 11"  (1.803 m)   Wt 264 lb (119.7 kg)   BMI 36.82 kg/m   Specialty Comments:  No specialty comments available.  PMFS History: Patient Active Problem List   Diagnosis Date Noted  . Cutaneous abscess of right foot   . Chronic pain of left knee 03/05/2018  . Scrotal abscess 08/30/2017  . Abscess 08/30/2017  . Epididymoorchitis 08/15/2017  . Great toe amputation status, right  07/24/2017  . Diabetic foot infection ([place])   . Subacute osteomyelitis, right ankle and foot ([place])   . Diabetic foot ([place]) 07/03/2017  . Diabetes mellitus type 2, uncomplicated ([place]) [id number]  . Prehypertension 04/15/2015   Past Medical History:  Diagnosis Date  . Diabetes mellitus without complication ([place])   . Epididymitis 08/15/2017  . Hx of necrotising fasciitis 06/2017   RLE/notes 08/15/2017  . Scrotal swelling    left; due to Epididymitis/notes 08/15/2017  . Sleep apnea    does not use a cpap    History reviewed. No pertinent family history.  Past Surgical History:  Procedure Laterality Date  . AMPUTATION TOE Right 07/06/2017   Procedure: RIGHT GREAT TOE AND SECOND TOE AMPUTATION;  Surgeon: Newt Minion, MD;  Location: [facility];  Service: Orthopedics;  Laterality: Right;  . CIRCUMCISION    . MASS BIOPSY  ~ 2008   scrotum/notes 08/15/2017  . SCROTAL EXPLORATION N/A 08/21/2017   Procedure: INCISION AND DRAINAGE SCROTAL ABSCESS, CYSTOSCOPY, COMPLEX INSERTION OF URETHERAL FOLEY CATHETER;  Surgeon: Raynelle Bring, MD;  Location: [facility];  Service: Urology;  Laterality: N/A;  . SCROTAL EXPLORATION N/A 08/30/2017   Procedure: SCROTUM DEBRIDEMENT;  Surgeon: Raynelle Bring, MD;  Location: [facility];  Service: Urology;  Laterality: N/A;   Social History   Occupational History  . Not on file  Tobacco Use  . Smoking status: Never Smoker  . Smokeless tobacco: Never Used  Substance and Sexual Activity  . Alcohol use: No    Alcohol/week: 0.0 standard drinks  . Drug use: No  . Sexual activity: Not on file

## 2019-09-05 ENCOUNTER — Emergency Department (HOSPITAL_COMMUNITY)
Admission: EM | Admit: 2019-09-05 | Discharge: 2019-09-05 | Disposition: A | Discharge status: Home / Self Care | Payer: Medicaid Other | Attending: Emergency Medicine | Admitting: Emergency Medicine

## 2019-09-05 ENCOUNTER — Other Ambulatory Visit: Payer: Self-pay

## 2019-09-05 ENCOUNTER — Emergency Department (HOSPITAL_COMMUNITY): Payer: Medicaid Other

## 2019-09-05 ENCOUNTER — Encounter (HOSPITAL_COMMUNITY): Payer: Self-pay | Admitting: Emergency Medicine

## 2019-09-05 DIAGNOSIS — Z79899 Other long term (current) drug therapy: Secondary | ICD-10-CM | POA: Insufficient documentation

## 2019-09-05 DIAGNOSIS — E119 Type 2 diabetes mellitus without complications: Secondary | ICD-10-CM | POA: Diagnosis not present

## 2019-09-05 DIAGNOSIS — N50819 Testicular pain, unspecified: Secondary | ICD-10-CM

## 2019-09-05 DIAGNOSIS — Z794 Long term (current) use of insulin: Secondary | ICD-10-CM | POA: Insufficient documentation

## 2019-09-05 DIAGNOSIS — N50811 Right testicular pain: Secondary | ICD-10-CM | POA: Diagnosis present

## 2019-09-05 LAB — URINALYSIS, ROUTINE W REFLEX MICROSCOPIC
Bacteria, UA: NONE SEEN
Bilirubin Urine: NEGATIVE
Glucose, UA: 50 mg/dL — AB
Hgb urine dipstick: NEGATIVE
Ketones, ur: NEGATIVE mg/dL
Nitrite: NEGATIVE
Protein, ur: NEGATIVE mg/dL
Specific Gravity, Urine: 1.012 (ref 1.005–1.030)
pH: 6 (ref 5.0–8.0)

## 2019-09-05 NOTE — Discharge Instructions (Addendum)
Continue doxycycline and Omnicef as previously prescribed.  Follow-up with your primary doctor for recheck in the next 1 to 2 weeks, and return to the ER if you develop high fever, severe pain, swelling, or other new and concerning symptoms.

## 2019-09-05 NOTE — ED Provider Notes (Signed)
[facility] EMERGENCY DEPARTMENT Provider Note   CSN: [id number] Arrival date & time: 09/05/19  1549     History Chief Complaint  Patient presents with  . Testicle Pain    Spencer Robles is a 59 y.o. male.  Patient is a 59 year old male with past medical history of diabetes and previous scrotal abscess requiring debridement.  He presents today for evaluation of pain in his scrotum.  He states that this started 2 days ago and is worsening.  He denies any fevers or chills.  He denies any drainage.  He denies any difficulty urinating.  The history is provided by the patient.  Testicle Pain This is a new problem. The current episode started 2 days ago. The problem occurs constantly. The problem has been gradually worsening. Pertinent negatives include no abdominal pain. Nothing aggravates the symptoms. Nothing relieves the symptoms. He has tried nothing for the symptoms.       Past Medical History:  Diagnosis Date  . Diabetes mellitus without complication ([place])   . Epididymitis 08/15/2017  . Hx of necrotising fasciitis 06/2017   RLE/notes 08/15/2017  . Scrotal swelling    left; due to Epididymitis/notes 08/15/2017  . Sleep apnea    does not use a cpap    Patient Active Problem List   Diagnosis Date Noted  . Cutaneous abscess of right foot   . Chronic pain of left knee 03/05/2018  . Scrotal abscess 08/30/2017  . Abscess 08/30/2017  . Epididymoorchitis 08/15/2017  . Great toe amputation status, right 07/24/2017  . Diabetic foot infection ([place])   . Subacute osteomyelitis, right ankle and foot ([place])   . Diabetic foot ([place]) 07/03/2017  . Diabetes mellitus type 2, uncomplicated ([place]) 74/02/1447  . Prehypertension 04/15/2015    Past Surgical History:  Procedure Laterality Date  . AMPUTATION TOE Right 07/06/2017   Procedure: RIGHT GREAT TOE AND SECOND TOE AMPUTATION;  Surgeon: Newt Minion, MD;  Location: [facility];  Service: Orthopedics;  Laterality: Right;    . CIRCUMCISION    . MASS BIOPSY  ~ 2008   scrotum/notes 08/15/2017  . SCROTAL EXPLORATION N/A 08/21/2017   Procedure: INCISION AND DRAINAGE SCROTAL ABSCESS, CYSTOSCOPY, COMPLEX INSERTION OF URETHERAL FOLEY CATHETER;  Surgeon: Raynelle Bring, MD;  Location: [facility];  Service: Urology;  Laterality: N/A;  . SCROTAL EXPLORATION N/A 08/30/2017   Procedure: SCROTUM DEBRIDEMENT;  Surgeon: Raynelle Bring, MD;  [facility];  Service: Urology;  Laterality: N/A;       No family history on file.  Social History   Tobacco Use  . Smoking status: Never Smoker  . Smokeless tobacco: Never Used  Substance Use Topics  . Alcohol use: No    Alcohol/week: 0.0 standard drinks  . Drug use: No    Home Medications Prior to Admission medications   Medication Sig Start Date End Date Taking? Authorizing Provider  aspirin EC 81 MG tablet Take 1 tablet (81 mg total) by mouth daily. 08/28/19   Florencia Reasons, MD  atorvastatin (LIPITOR) 20 MG tablet Take 1 tablet (20 mg total) by mouth daily. 08/28/19   Florencia Reasons, MD  Blood Glucose Monitoring Suppl (TRUE METRIX METER) w/Device KIT Use to check blood sugar up to 3 times daily. 08/28/19   Florencia Reasons, MD  cefdinir (OMNICEF) 300 MG capsule Take 1 capsule (300 mg total) by mouth 2 (two) times daily for 14 days. 08/28/19 09/11/19  Florencia Reasons, MD  doxycycline (ADOXA) 100 MG tablet Take 1 tablet (100 mg  total) by mouth 2 (two) times daily for 14 days. 08/28/19 09/11/19  Florencia Reasons, MD  gabapentin (NEURONTIN) 300 MG capsule Take 2 capsules (600 mg total) by mouth 3 (three) times daily. 08/28/19   Florencia Reasons, MD  glucose blood (TRUE METRIX BLOOD GLUCOSE TEST) test strip Use as instructed to check blood sugar up to 3 times daily. 08/28/19   Florencia Reasons, MD  ibuprofen (ADVIL) 800 MG tablet Take 800 mg by mouth every 6 (six) hours as needed for mild pain.     [provider]  Insulin Glargine (LANTUS SOLOSTAR) 100 UNIT/ML Solostar Pen Inject 10 Units into the skin daily. 08/28/19   Florencia Reasons, MD   metFORMIN (GLUCOPHAGE) 500 MG tablet Take 2 tablets (1,000 mg total) by mouth 2 (two) times daily with a meal. 08/28/19   Florencia Reasons, MD  sitaGLIPtin (JANUVIA) 100 MG tablet Take 1 tablet (100 mg total) by mouth daily. 08/28/19   Florencia Reasons, MD  TRUEplus Lancets 28G MISC Use as instructed to check blood sugar up to 3 times daily. 08/28/19   Florencia Reasons, MD    Allergies    Patient has no known allergies.  Review of Systems   Review of Systems  Gastrointestinal: Negative for abdominal pain.  Genitourinary: Positive for testicular pain.  All other systems reviewed and are negative.   Physical Exam Updated Vital Signs BP 124/75   Pulse 71   Temp 98.6 F (37 C) (Oral)   Resp 12   SpO2 98%   Physical Exam Vitals and nursing note reviewed.  Constitutional:      General: He is not in acute distress.    Appearance: He is well-developed. He is not diaphoretic.  HENT:     Head: Normocephalic and atraumatic.  Cardiovascular:     Rate and Rhythm: Normal rate and regular rhythm.     Heart sounds: No murmur. No friction rub.  Pulmonary:     Effort: Pulmonary effort is normal. No respiratory distress.     Breath sounds: Normal breath sounds. No wheezing or rales.  Abdominal:     General: Bowel sounds are normal. There is no distension.     Palpations: Abdomen is soft.     Tenderness: There is no abdominal tenderness.  Genitourinary:    Penis: Normal.      Comments: Testicles appear grossly normal.  He does report mild tenderness behind the right testicle, however there are no obvious abscesses, lesions, or masses.  Testicles are freely mobile within the scrotum. Musculoskeletal:        General: Normal range of motion.     Cervical back: Normal range of motion and neck supple.  Skin:    General: Skin is warm and dry.  Neurological:     Mental Status: He is alert and oriented to person, place, and time.     Coordination: Coordination normal.     ED Results / Procedures / Treatments    Labs (all labs ordered are listed, but only abnormal results are displayed) Labs Reviewed  URINALYSIS, ROUTINE W REFLEX MICROSCOPIC    EKG None  Radiology No results found.  Procedures Procedures (including critical care time)  Medications Ordered in ED Medications - No data to display  ED Course  I have reviewed the triage vital signs and the nursing notes.  Pertinent labs & imaging results that were available during my care of the patient were reviewed by me and considered in my medical decision making (see chart for details).  MDM Rules/Calculators/A&P  Patient presenting with complaints of testicular discomfort.  He has a history of scrotal abscess and is concerned this is recurring.  Patient's exam is unremarkable.  There is no appreciable scrotal swelling or testicular masses palpable.  I see no evidence for infection within the scrotum itself.  There is no crepitus or purulent drainage.  Ultrasound shows heterogeneous appearance of the right testicle with infiltrative hypoechoic masslike area centrally.  This could potentially represent testicular phlegmon or developing abscess.  Patient is already on 2 separate antibiotics for the infection within his foot including Omnicef and doxycycline.  I doubt infection, and this could potentially be residual findings from his prior infection.  At this point, I feel as though observation and time is indicated.  Patient seems appropriate for discharge.  I have advised him if this progresses or he develops fevers, he should return to the ER to be reevaluated.  Should also be followed up by his primary doctor in the next 1 to 2 weeks either way.  Final Clinical Impression(s) / ED Diagnoses Final diagnoses:  None    Rx / DC Orders ED Discharge Orders    None       Veryl Speak, MD 09/05/19 2030

## 2019-09-05 NOTE — ED Triage Notes (Signed)
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 Endorses similar pain. Pain is 1/10 but he does not want it to get bad like last time.

## 2019-09-08 ENCOUNTER — Ambulatory Visit (INDEPENDENT_AMBULATORY_CARE_PROVIDER_SITE_OTHER): Payer: Medicaid Other | Admitting: Primary Care

## 2019-09-08 NOTE — Telephone Encounter (Signed)
Please refer to Henreitta Leber, RN note.

## 2019-09-18 ENCOUNTER — Inpatient Hospital Stay (INDEPENDENT_AMBULATORY_CARE_PROVIDER_SITE_OTHER): Payer: Medicaid Other | Admitting: Primary Care

## 2019-09-19 ENCOUNTER — Other Ambulatory Visit: Payer: Self-pay

## 2019-09-19 ENCOUNTER — Ambulatory Visit (INDEPENDENT_AMBULATORY_CARE_PROVIDER_SITE_OTHER): Payer: Medicaid Other | Admitting: Family

## 2019-09-19 ENCOUNTER — Encounter: Payer: Self-pay | Admitting: Family

## 2019-09-19 VITALS — Ht 71.0 in | Wt 264.0 lb

## 2019-09-19 DIAGNOSIS — L97511 Non-pressure chronic ulcer of other part of right foot limited to breakdown of skin: Secondary | ICD-10-CM | POA: Diagnosis not present

## 2019-09-19 DIAGNOSIS — I872 Venous insufficiency (chronic) (peripheral): Secondary | ICD-10-CM | POA: Diagnosis not present

## 2019-09-19 NOTE — Progress Notes (Signed)
Office Visit Note   Patient: Spencer Robles           Date of Birth: 1960/12/24           MRN: [id number] Visit Date: 09/19/2019              Requested by: Kerin Perna, NP [address] [place],  [place] [address] PCP: Kerin Perna, NP  Chief Complaint  Patient presents with  . Left Leg - Follow-up  . Right Leg - Follow-up      HPI: Patient is a 59 year old gentleman who is seen today in follow-up for examination of the bilateral lower extremities he unfortunately had a new ulcer to the right foot a few weeks ago.  It was recommended that he obtain and wear compression garments at last visit unfortunately he has not been able to do so.  He does note that he has been doing a bit more walking over the last week noticed improvement in his swelling to his left lower extremity.  No systemic symptoms.   Assessment & Plan: Visit Diagnoses:  No diagnosis found.  Plan: There was no need for debridement today.  Recommended lotion or creams for his dry skin again recommended compression garments for his venous insufficiency.  He will continue his protective shoe wear and follow-up as scheduled in 2 months Follow-Up Instructions: No follow-ups on file.   Ortho Exam  Patient is alert, oriented, no adenopathy, well-dressed, normal affect, normal respiratory effort.  On examination bilateral lower extremities there is 2+ pitting edema bilaterally without erythema or weeping. Examination patient's feet are plantigrade there is no redness no cellulitis no signs of infection.  The ulcers are fully healed there is no open area no drainage no breakdown.  Patient is ambulating with a quad cane postoperative shoe on the right.  Imaging: No results found. No images are attached to the encounter.  Labs: Lab Results  Component Value Date   HGBA1C 14.3 (H) 08/25/2019   HGBA1C 13.0 (A) 11/06/2018   HGBA1C 6.0 (A) 05/08/2018   ESRSEDRATE 7 08/25/2019   ESRSEDRATE 17 05/08/2018   ESRSEDRATE 70 (H) 07/03/2017   CRP 2 05/08/2018   CRP 12.9 (H) 07/03/2017   CRP 14.2 (H) 07/03/2017   REPTSTATUS 08/28/2019 FINAL 08/26/2019   GRAMSTAIN  08/26/2019    RARE WBC PRESENT,BOTH PMN AND MONONUCLEAR FEW GRAM POSITIVE COCCI IN PAIRS FEW GRAM POSITIVE RODS Performed at [facility], [place] [address], [place], [place] [address]    CULT  08/26/2019    MODERATE METHICILLIN RESISTANT STAPHYLOCOCCUS AUREUS FEW KLEBSIELLA ORNITHINOLYTICA    LABORGA METHICILLIN RESISTANT STAPHYLOCOCCUS AUREUS 08/26/2019   LABORGA KLEBSIELLA ORNITHINOLYTICA 08/26/2019     Lab Results  Component Value Date   ALBUMIN 4.1 08/25/2019   ALBUMIN 4.9 12/20/2018   ALBUMIN 4.7 05/08/2018   PREALBUMIN 8.5 (L) 07/03/2017    Lab Results  Component Value Date   MG 1.7 05/08/2018   No results found for: [facility]  Lab Results  Component Value Date   PREALBUMIN 8.5 (L) 07/03/2017   CBC EXTENDED Latest Ref Rng & Units 08/28/2019 08/27/2019 08/25/2019  WBC 4.0 - 10.5 K/uL 7.5 8.1 -  RBC 4.22 - 5.81 MIL/uL 4.83 4.73 -  HGB 13.0 - 17.0 g/dL 13.5 13.3 15.0  HCT 39.0 - 52.0 % 41.1 39.7 44.0  PLT 150 - 400 K/uL 125(L) 125(L) -  NEUTROABS 1.7 - 7.7 K/uL - - -  LYMPHSABS 0.7 - 4.0 K/uL - - -  Body mass index is 36.82 kg/m.  Orders:  No orders of the defined types were placed in this encounter.  No orders of the defined types were placed in this encounter.    Procedures: No procedures performed  Clinical Data: No additional findings.  ROS:  All other systems negative, except as noted in the HPI. Review of Systems  Constitutional: Negative for chills and fever.  Respiratory: Negative for chest tightness and shortness of breath.   Cardiovascular: Positive for leg swelling. Negative for chest pain.  Skin: Negative for color change and wound.    Objective: Vital Signs: Ht 5\' 11"  (1.803 m)   Wt 264 lb (119.7 kg)   BMI 36.82 kg/m   Specialty Comments:  No specialty comments  available.  PMFS History: Patient Active Problem List   Diagnosis Date Noted  . Cutaneous abscess of right foot   . Chronic pain of left knee 03/05/2018  . Scrotal abscess 08/30/2017  . Abscess 08/30/2017  . Epididymoorchitis 08/15/2017  . Great toe amputation status, right 07/24/2017  . Diabetic foot infection ([place])   . Subacute osteomyelitis, right ankle and foot ([place])   . Diabetic foot ([place]) 07/03/2017  . Diabetes mellitus type 2, uncomplicated ([place]) [id number]  . Prehypertension 04/15/2015   Past Medical History:  Diagnosis Date  . Diabetes mellitus without complication ([place])   . Epididymitis 08/15/2017  . Hx of necrotising fasciitis 06/2017   RLE/notes 08/15/2017  . Scrotal swelling    left; due to Epididymitis/notes 08/15/2017  . Sleep apnea    does not use a cpap    No family history on file.  Past Surgical History:  Procedure Laterality Date  . AMPUTATION TOE Right 07/06/2017   Procedure: RIGHT GREAT TOE AND SECOND TOE AMPUTATION;  Surgeon: Newt Minion, MD;  Location: [facility];  Service: Orthopedics;  Laterality: Right;  . CIRCUMCISION    . MASS BIOPSY  ~ 2008   scrotum/notes 08/15/2017  . SCROTAL EXPLORATION N/A 08/21/2017   Procedure: INCISION AND DRAINAGE SCROTAL ABSCESS, CYSTOSCOPY, COMPLEX INSERTION OF URETHERAL FOLEY CATHETER;  Surgeon: Raynelle Bring, MD;  Location: [facility];  Service: Urology;  Laterality: N/A;  . SCROTAL EXPLORATION N/A 08/30/2017   Procedure: SCROTUM DEBRIDEMENT;  Surgeon: Raynelle Bring, MD;  Location[facility];  Service: Urology;  Laterality: N/A;   Social History   Occupational History  . Not on file  Tobacco Use  . Smoking status: Never Smoker  . Smokeless tobacco: Never Used  Substance and Sexual Activity  . Alcohol use: No    Alcohol/week: 0.0 standard drinks  . Drug use: No  . Sexual activity: Not on file

## 2019-09-25 NOTE — Telephone Encounter (Signed)
error 

## 2019-09-29 MED FILL — LANTUS SOLOSTAR 100 UNITS/M: 100 | 28 days supply | Qty: 3 | Fill #0

## 2019-10-08 MED FILL — ?METFORMIN HCL 500MG TABLET: 500 | 30 days supply | Qty: 120 | Fill #0

## 2019-10-08 MED FILL — ATORVASTATIN CALCIUM 20 MG: 20 | 30 days supply | Qty: 30 | Fill #0

## 2019-10-14 ENCOUNTER — Encounter: Payer: Self-pay | Admitting: Family

## 2019-10-14 ENCOUNTER — Other Ambulatory Visit: Payer: Self-pay

## 2019-10-14 ENCOUNTER — Ambulatory Visit (INDEPENDENT_AMBULATORY_CARE_PROVIDER_SITE_OTHER): Payer: Medicaid Other | Admitting: Family

## 2019-10-14 VITALS — Ht 71.0 in | Wt 264.0 lb

## 2019-10-14 DIAGNOSIS — I872 Venous insufficiency (chronic) (peripheral): Secondary | ICD-10-CM

## 2019-10-14 NOTE — Progress Notes (Signed)
Office Visit Note   Patient: Spencer Robles           Date of Birth: 05-12-1961           MRN: [id number] Visit Date: 10/14/2019              Requested by: Kerin Perna, NP [address] [place],  [place] [address] PCP: Kerin Perna, NP  Chief Complaint  Patient presents with  . Right Foot - Follow-up      HPI: The patient is a 59 year old gentleman who presents today for evaluation of some new color changes to his right lower extremity.  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  He unfortunately has had a hard time being compliant with compression garments.  Assessment & Plan: Visit Diagnoses: No diagnosis found.  Plan: Offered Dynaflex compressive wraps for bilateral lower extremities to get the swelling down so that he could fit into his compression garments.  Patient declined today.  He would like to continue with his own personal home care.  He will follow-up in the office as needed.  Reassurance provided there is no sign of infection or gangrene.  Follow-Up Instructions: Return if symptoms worsen or fail to improve.   Ortho Exam  Patient is alert, oriented, no adenopathy, well-dressed, normal affect, normal respiratory effort. On examination of the right lower extremity his right great toe and second toe amputation sites are well-healed he does have pitting 2+ edema to bilateral lower extremities there is no erythema or warmth no sign of cellulitis he does have some brawny skin color changes..  He does have palpable dorsalis pedis pulses.  Imaging: No results found. No images are attached to the encounter.  Labs: Lab Results  Component Value Date   HGBA1C 14.3 (H) 08/25/2019   HGBA1C 13.0 (A) 11/06/2018   HGBA1C 6.0 (A) 05/08/2018   ESRSEDRATE 7 08/25/2019   ESRSEDRATE 17 05/08/2018   ESRSEDRATE 70 (H) 07/03/2017   CRP 2  05/08/2018   CRP 12.9 (H) 07/03/2017   CRP 14.2 (H) 07/03/2017   REPTSTATUS 08/28/2019 FINAL 08/26/2019   GRAMSTAIN  08/26/2019    RARE WBC PRESENT,BOTH PMN AND MONONUCLEAR FEW GRAM POSITIVE COCCI IN PAIRS FEW GRAM POSITIVE RODS Performed at [facility], [place] [address], [place], [place] [address]    CULT  08/26/2019    MODERATE METHICILLIN RESISTANT STAPHYLOCOCCUS AUREUS FEW KLEBSIELLA ORNITHINOLYTICA    LABORGA METHICILLIN RESISTANT STAPHYLOCOCCUS AUREUS 08/26/2019   LABORGA KLEBSIELLA ORNITHINOLYTICA 08/26/2019     Lab Results  Component Value Date   ALBUMIN 4.1 08/25/2019   ALBUMIN 4.9 12/20/2018   ALBUMIN 4.7 05/08/2018   PREALBUMIN 8.5 (L) 07/03/2017    Lab Results  Component Value Date   MG 1.7 05/08/2018   No results found for: [facility]  Lab Results  Component Value Date   PREALBUMIN 8.5 (L) 07/03/2017   CBC EXTENDED Latest Ref Rng & Units 08/28/2019 08/27/2019 08/25/2019  WBC 4.0 - 10.5 K/uL 7.5 8.1 -  RBC 4.22 - 5.81 MIL/uL 4.83 4.73 -  HGB 13.0 - 17.0 g/dL 13.5 13.3 15.0  HCT 39.0 - 52.0 % 41.1 39.7 44.0  PLT 150 - 400 K/uL 125(L) 125(L) -  NEUTROABS 1.7 - 7.7 K/uL - - -  LYMPHSABS 0.7 - 4.0 K/uL - - -     Body mass index is 36.82 kg/m.  Orders:  No orders of the defined types were placed in this encounter.  No orders of the defined types were placed in this encounter.    Procedures: No procedures performed  Clinical Data: No additional findings.  ROS:  All other systems negative, except as noted in the HPI. Review of Systems  Constitutional: Negative for chills and fever.  Cardiovascular: Positive for leg swelling.  Musculoskeletal: Positive for gait problem.  Skin: Positive for color change. Negative for rash and wound.    Objective: Vital Signs: Ht 5\' 11"  (1.803 m)   Wt 264 lb (119.7 kg)   BMI 36.82 kg/m   Specialty Comments:  No specialty comments available.  PMFS History: Patient Active Problem List   Diagnosis Date  Noted  . Cutaneous abscess of right foot   . Chronic pain of left knee 03/05/2018  . Scrotal abscess 08/30/2017  . Abscess 08/30/2017  . Epididymoorchitis 08/15/2017  . Great toe amputation status, right 07/24/2017  . Diabetic foot infection ([place])   . Subacute osteomyelitis, right ankle and foot ([place])   . Diabetic foot ([place]) 07/03/2017  . Diabetes mellitus type 2, uncomplicated ([place]) [id number]  . Prehypertension 04/15/2015   Past Medical History:  Diagnosis Date  . Diabetes mellitus without complication ([place])   . Epididymitis 08/15/2017  . Hx of necrotising fasciitis 06/2017   RLE/notes 08/15/2017  . Scrotal swelling    left; due to Epididymitis/notes 08/15/2017  . Sleep apnea    does not use a cpap    History reviewed. No pertinent family history.  Past Surgical History:  Procedure Laterality Date  . AMPUTATION TOE Right 07/06/2017   Procedure: RIGHT GREAT TOE AND SECOND TOE AMPUTATION;  Surgeon: Newt Minion, MD;  Location: [facility];  Service: Orthopedics;  Laterality: Right;  . CIRCUMCISION    . MASS BIOPSY  ~ 2008   scrotum/notes 08/15/2017  . SCROTAL EXPLORATION N/A 08/21/2017   Procedure: INCISION AND DRAINAGE SCROTAL ABSCESS, CYSTOSCOPY, COMPLEX INSERTION OF URETHERAL FOLEY CATHETER;  Surgeon: Raynelle Bring, MD;  Location: [facility];  Service: Urology;  Laterality: N/A;  . SCROTAL EXPLORATION N/A 08/30/2017   Procedure: SCROTUM DEBRIDEMENT;  Surgeon: Raynelle Bring, MD;  Location: [facility];  Service: Urology;  Laterality: N/A;   Social History   Occupational History  . Not on file  Tobacco Use  . Smoking status: Never Smoker  . Smokeless tobacco: Never Used  Substance and Sexual Activity  . Alcohol use: No    Alcohol/week: 0.0 standard drinks  . Drug use: No  . Sexual activity: Not on file

## 2019-10-31 MED FILL — ACCU-CHEK SOFTCLIX LANCETS: 30 days supply | Qty: 100 | Fill #0

## 2019-10-31 MED FILL — LANTUS SOLOSTAR 100 UNITS/M: 100 | 28 days supply | Qty: 3 | Fill #1

## 2019-10-31 MED FILL — ACCU-CHEK GUIDE TEST STRIP: 30 days supply | Qty: 100 | Fill #0

## 2019-11-13 MED FILL — GABAPENTIN 300 MG CAPSULE: 300 | 30 days supply | Qty: 180 | Fill #0

## 2019-12-02 ENCOUNTER — Ambulatory Visit (INDEPENDENT_AMBULATORY_CARE_PROVIDER_SITE_OTHER): Payer: Medicaid Other | Admitting: Physician Assistant

## 2019-12-02 ENCOUNTER — Encounter: Payer: Self-pay | Admitting: Physician Assistant

## 2019-12-02 ENCOUNTER — Other Ambulatory Visit: Payer: Self-pay | Admitting: Physician Assistant

## 2019-12-02 ENCOUNTER — Other Ambulatory Visit: Payer: Self-pay

## 2019-12-02 DIAGNOSIS — Z760 Encounter for issue of repeat prescription: Secondary | ICD-10-CM

## 2019-12-02 DIAGNOSIS — M86271 Subacute osteomyelitis, right ankle and foot: Secondary | ICD-10-CM | POA: Diagnosis not present

## 2019-12-02 DIAGNOSIS — E119 Type 2 diabetes mellitus without complications: Secondary | ICD-10-CM

## 2019-12-02 MED ORDER — METFORMIN HCL 500 MG PO TABS: 1000.0000 mg | ORAL_TABLET | Freq: Two times a day (BID) | ORAL | 0 refills | Status: DC

## 2019-12-02 NOTE — Progress Notes (Unsigned)
Office Visit Note   Patient: Spencer Robles           Date of Birth: 11/29/1960           MRN: [id number] Visit Date: 12/02/2019              Requested by: No referring provider defined for this encounter. PCP: Kerin Perna, NP  No chief complaint on file.     HPI: This is a pleasant gentleman who we follow for bilateral venous stasis insufficiency.  He is also status post first and second toe amputation.  We have not seen him in a couple months.  Overall he is doing well.  He is not wearing compression stockings today and chooses not to wear them.  However he states he has stopped using salt and he feels the swelling in his legs is much better.  He is concerned and that he ran out of his Metformin 2 weeks ago.  Apparently this was written by hospitalist when he was discharged at the hospital and there is been some confusion about getting it refilled with [facility].  His blood sugar has creeped up to 208  Assessment & Plan: Visit Diagnoses:  1. Medication refill   2. Type 2 diabetes mellitus without complication, with long-term current use of insulin ([place])     Plan: I discussed with the patient today that I can give him 1 refill of his Metformin to hold him over until he can establish with community health and wellness and then they can take over doing this.  He may follow-up with [place] as needed.  I did offer to trim down some of his calluses but he declined this today  Follow-Up Instructions: No follow-ups on file.   Ortho Exam  Patient is alert, oriented, no adenopathy, well-dressed, normal affect, normal respiratory effort. Focused examination demonstrates status post right foot first and second toe amputation well-healed surgical incision no erythema strong dorsalis pedis pulse.  He does have some callusing on the lateral side of the foot.  There is no surrounding cellulitis no drainage.  Also significant callusing on the bottom of his foot but again no  cellulitis no drainage he does have a tight heel cord and comes to just a neutral position bilaterally his lower extremities has some moderate swelling but are compressible.  Left foot has strong dorsalis pedis pulse with no skin breakdown or cellulitis  Imaging: No results found. No images are attached to the encounter.  Labs: Lab Results  Component Value Date   HGBA1C 14.3 (H) 08/25/2019   HGBA1C 13.0 (A) 11/06/2018   HGBA1C 6.0 (A) 05/08/2018   ESRSEDRATE 7 08/25/2019   ESRSEDRATE 17 05/08/2018   ESRSEDRATE 70 (H) 07/03/2017   CRP 2 05/08/2018   CRP 12.9 (H) 07/03/2017   CRP 14.2 (H) 07/03/2017   REPTSTATUS 08/28/2019 FINAL 08/26/2019   GRAMSTAIN  08/26/2019    RARE WBC PRESENT,BOTH PMN AND MONONUCLEAR FEW GRAM POSITIVE COCCI IN PAIRS FEW GRAM POSITIVE RODS Performed at [facility], [place] [address] [place], [place] [address]    CULT  08/26/2019    MODERATE METHICILLIN RESISTANT STAPHYLOCOCCUS AUREUS FEW KLEBSIELLA ORNITHINOLYTICA    LABORGA METHICILLIN RESISTANT STAPHYLOCOCCUS AUREUS 08/26/2019   LABORGA KLEBSIELLA ORNITHINOLYTICA 08/26/2019     Lab Results  Component Value Date   ALBUMIN 4.1 08/25/2019   ALBUMIN 4.9 12/20/2018   ALBUMIN 4.7 05/08/2018   PREALBUMIN 8.5 (L) 07/03/2017    Lab Results  Component Value Date   MG 1.7 05/08/2018   No results found for: [facility]  Lab Results  Component Value Date   PREALBUMIN 8.5 (L) 07/03/2017   CBC EXTENDED Latest Ref Rng & Units 08/28/2019 08/27/2019 08/25/2019  WBC 4.0 - 10.5 K/uL 7.5 8.1 -  RBC 4.22 - 5.81 MIL/uL 4.83 4.73 -  HGB 13.0 - 17.0 g/dL 13.5 13.3 15.0  HCT 39.0 - 52.0 % 41.1 39.7 44.0  PLT 150 - 400 K/uL 125(L) 125(L) -  NEUTROABS 1.7 - 7.7 K/uL - - -  LYMPHSABS 0.7 - 4.0 K/uL - - -     There is no height or weight on file to calculate BMI.  Orders:  No orders of the defined types were placed in this encounter.  Meds ordered this encounter  Medications  . metFORMIN (GLUCOPHAGE) 500 MG  tablet    Sig: Take 2 tablets (1,000 mg total) by mouth 2 (two) times daily with a meal.    Dispense:  120 tablet    Refill:  0     Procedures: No procedures performed  Clinical Data: No additional findings.  ROS:  All other systems negative, except as noted in the HPI. Review of Systems  Objective: Vital Signs: There were no vitals taken for this visit.  Specialty Comments:  No specialty comments available.  PMFS History: Patient Active Problem List   Diagnosis Date Noted  . Cutaneous abscess of right foot   . Chronic pain of left knee 03/05/2018  . Scrotal abscess 08/30/2017  . Abscess 08/30/2017  . Epididymoorchitis 08/15/2017  . Great toe amputation status, right 07/24/2017  . Diabetic foot infection ([place])   . Subacute osteomyelitis, right ankle and foot ([place])   . Diabetic foot ([place]) 07/03/2017  . Diabetes mellitus type 2, uncomplicated ([place]) [id number]  . Prehypertension 04/15/2015   Past Medical History:  Diagnosis Date  . Diabetes mellitus without complication ([place])   . Epididymitis 08/15/2017  . Hx of necrotising fasciitis 06/2017   RLE/notes 08/15/2017  . Scrotal swelling    left; due to Epididymitis/notes 08/15/2017  . Sleep apnea    does not use a cpap    No family history on file.  Past Surgical History:  Procedure Laterality Date  . AMPUTATION TOE Right 07/06/2017   Procedure: RIGHT GREAT TOE AND SECOND TOE AMPUTATION;  Surgeon: Newt Minion, MD;  Location: [facility];  Service: Orthopedics;  Laterality: Right;  . CIRCUMCISION    . MASS BIOPSY  ~ 2008   scrotum/notes 08/15/2017  . SCROTAL EXPLORATION N/A 08/21/2017   Procedure: INCISION AND DRAINAGE SCROTAL ABSCESS, CYSTOSCOPY, COMPLEX INSERTION OF URETHERAL FOLEY CATHETER;  Surgeon: Raynelle Bring, MD;  Location: [facility];  Service: Urology;  Laterality: N/A;  . SCROTAL EXPLORATION N/A 08/30/2017   Procedure: SCROTUM DEBRIDEMENT;  Surgeon: Raynelle Bring, MD;  [facility];  Service: Urology;   Laterality: N/A;   Social History   Occupational History  . Not on file  Tobacco Use  . Smoking status: Never Smoker  . Smokeless tobacco: Never Used  Substance and Sexual Activity  . Alcohol use: No    Alcohol/week: 0.0 standard drinks  . Drug use: No  . Sexual activity: Not on file

## 2019-12-17 MED FILL — LANTUS SOLOSTAR 100 UNITS/M: 100 | 28 days supply | Qty: 3 | Fill #2

## 2020-01-08 ENCOUNTER — Telehealth: Payer: Self-pay | Admitting: Orthopedic Surgery

## 2020-01-08 NOTE — Telephone Encounter (Signed)
Completed [employer] transportation application mailed to patient

## 2020-01-13 ENCOUNTER — Ambulatory Visit (INDEPENDENT_AMBULATORY_CARE_PROVIDER_SITE_OTHER): Payer: Medicaid Other | Admitting: Primary Care

## 2020-01-13 ENCOUNTER — Other Ambulatory Visit: Payer: Self-pay

## 2020-01-13 ENCOUNTER — Other Ambulatory Visit (INDEPENDENT_AMBULATORY_CARE_PROVIDER_SITE_OTHER): Payer: Self-pay | Admitting: Primary Care

## 2020-01-13 ENCOUNTER — Encounter (INDEPENDENT_AMBULATORY_CARE_PROVIDER_SITE_OTHER): Payer: Self-pay | Admitting: Primary Care

## 2020-01-13 VITALS — BP 129/84 | HR 48 | Temp 98.0°F | Ht 71.0 in | Wt 279.2 lb

## 2020-01-13 DIAGNOSIS — E1140 Type 2 diabetes mellitus with diabetic neuropathy, unspecified: Secondary | ICD-10-CM | POA: Diagnosis not present

## 2020-01-13 DIAGNOSIS — Z1211 Encounter for screening for malignant neoplasm of colon: Secondary | ICD-10-CM | POA: Diagnosis not present

## 2020-01-13 DIAGNOSIS — Z125 Encounter for screening for malignant neoplasm of prostate: Secondary | ICD-10-CM

## 2020-01-13 DIAGNOSIS — Z760 Encounter for issue of repeat prescription: Secondary | ICD-10-CM

## 2020-01-13 DIAGNOSIS — E119 Type 2 diabetes mellitus without complications: Secondary | ICD-10-CM

## 2020-01-13 DIAGNOSIS — Z794 Long term (current) use of insulin: Secondary | ICD-10-CM

## 2020-01-13 DIAGNOSIS — I10 Essential (primary) hypertension: Secondary | ICD-10-CM

## 2020-01-13 DIAGNOSIS — E782 Mixed hyperlipidemia: Secondary | ICD-10-CM

## 2020-01-13 LAB — POCT GLYCOSYLATED HEMOGLOBIN (HGB A1C): Hemoglobin A1C: 7.2 % — AB (ref 4.0–5.6)

## 2020-01-13 LAB — GLUCOSE, POCT (MANUAL RESULT ENTRY): POC Glucose: 188 mg/dl — AB (ref 70–99)

## 2020-01-13 MED ORDER — LISINOPRIL 5 MG PO TABS: 5.0000 mg | ORAL_TABLET | Freq: Every day | ORAL | 3 refills | Status: DC

## 2020-01-13 MED ORDER — ATORVASTATIN CALCIUM 20 MG PO TABS: 20.0000 mg | ORAL_TABLET | Freq: Every day | ORAL | 1 refills | Status: DC

## 2020-01-13 MED ORDER — TRUEPLUS PEN NEEDLES 31G X 5 MM MISC: 31.5000 mm | Freq: Two times a day (BID) | 11 refills | Status: DC | PRN

## 2020-01-13 MED ORDER — SITAGLIPTIN PHOSPHATE 100 MG PO TABS: 100.0000 mg | ORAL_TABLET | Freq: Every day | ORAL | 1 refills | Status: DC

## 2020-01-13 MED ORDER — METFORMIN HCL 1000 MG PO TABS: 1000.0000 mg | ORAL_TABLET | Freq: Two times a day (BID) | ORAL | 3 refills | Status: DC

## 2020-01-13 MED FILL — metFORMIN HCL 1000 MG TABS: 1000 | 90 days supply | Qty: 180 | Fill #0

## 2020-01-13 MED FILL — JANUVIA 100 MG TABLET: 100 | 90 days supply | Qty: 90 | Fill #0

## 2020-01-13 MED FILL — LISINOPRIL 5 MG TABLET: 5 | 90 days supply | Qty: 90 | Fill #0

## 2020-01-13 MED FILL — ATORVASTATIN CALCIUM 20 MG: 20 | 90 days supply | Qty: 90 | Fill #0

## 2020-01-13 MED FILL — TRUEPLUS 5-BEVEL PEN NEEDLE: 31G X 5 MM | 30 days supply | Qty: 100 | Fill #0

## 2020-01-13 NOTE — Progress Notes (Signed)
[id number] fasting  Established Patient Office Visit  Subjective:  Patient ID: Spencer Robles, male    DOB: Dec 26, 1960  Age: 59 y.o. MRN: [id number]  CC:  Chief Complaint  Patient presents with   Diabetes    HPI Mr. Porfirio Bollier presents for is a 59 year old male in today for management of diabetes and medication refills. Blood pressure controlled will add ACE for renal protection. His main concern is numbness and weak ness on his left side only from his hip to the bottom of his feet . A1C today  7.2 improved diabetes. 4 months ago 14.3  Past Medical History:  Diagnosis Date   Diabetes mellitus without complication ([place])    Epididymitis 08/15/2017   Hx of necrotising fasciitis 06/2017   RLE/notes 08/15/2017   Scrotal swelling    left; due to Epididymitis/notes 08/15/2017   Sleep apnea    does not use a cpap    Past Surgical History:  Procedure Laterality Date   AMPUTATION TOE Right 07/06/2017   Procedure: RIGHT GREAT TOE AND SECOND TOE AMPUTATION;  Surgeon: Newt Minion, MD;  Location: [facility];  Service: Orthopedics;  Laterality: Right;   CIRCUMCISION     MASS BIOPSY  ~ 2008   scrotum/notes 08/15/2017   SCROTAL EXPLORATION N/A 08/21/2017   Procedure: INCISION AND DRAINAGE SCROTAL ABSCESS, CYSTOSCOPY, COMPLEX INSERTION OF URETHERAL FOLEY CATHETER;  Surgeon: Raynelle Bring, MD;  Location: [facility];  Service: Urology;  Laterality: N/A;   SCROTAL EXPLORATION N/A 08/30/2017   Procedure: SCROTUM DEBRIDEMENT;  Surgeon: Raynelle Bring, MD;  [facility];  Service: Urology;  Laterality: N/A;    History reviewed. No pertinent family history.    Outpatient Medications Prior to Visit  Medication Sig Dispense Refill   aspirin EC 81 MG tablet Take 1 tablet (81 mg total) by mouth daily. 90 tablet 0   Blood Glucose Monitoring Suppl (TRUE METRIX METER) w/Device KIT Use to check blood sugar up to 3 times daily. 1 kit 0   gabapentin (NEURONTIN) 300 MG capsule Take 2 capsules (600  mg total) by mouth 3 (three) times daily. 180 capsule 5   glucose blood (TRUE METRIX BLOOD GLUCOSE TEST) test strip Use as instructed to check blood sugar up to 3 times daily. 100 each 11   ibuprofen (ADVIL) 800 MG tablet Take 800 mg by mouth every 6 (six) hours as needed for mild pain.      Insulin Glargine (LANTUS SOLOSTAR) 100 UNIT/ML Solostar Pen Inject 10 Units into the skin daily. 5 pen PRN   TRUEplus Lancets 28G MISC Use as instructed to check blood sugar up to 3 times daily. 100 each 11   atorvastatin (LIPITOR) 20 MG tablet Take 1 tablet (20 mg total) by mouth daily. 30 tablet 0   metFORMIN (GLUCOPHAGE) 500 MG tablet Take 2 tablets (1,000 mg total) by mouth 2 (two) times daily with a meal. 120 tablet 0   sitaGLIPtin (JANUVIA) 100 MG tablet Take 1 tablet (100 mg total) by mouth daily. 30 tablet 0   No facility-administered medications prior to visit.    No Known Allergies  ROS Review of Systems  Endocrine: Positive for polyuria.  Musculoskeletal: Positive for gait problem.  Neurological: Positive for weakness and numbness.       Tingling both feet  All other systems reviewed and are negative.     Objective:    Physical Exam Vitals reviewed.  Constitutional:      Appearance: He is obese.  HENT:  Right Ear: Tympanic membrane normal.     Left Ear: Tympanic membrane normal.  Eyes:     Extraocular Movements: Extraocular movements intact.     Pupils: Pupils are equal, round, and reactive to light.  Cardiovascular:     Rate and Rhythm: Normal rate and regular rhythm.     Pulses: Normal pulses.  Pulmonary:     Effort: Pulmonary effort is normal.     Breath sounds: Normal breath sounds.  Abdominal:     General: Bowel sounds are normal.  Musculoskeletal:        General: Normal range of motion.  Skin:    General: Skin is warm and dry.  Neurological:     Mental Status: He is alert and oriented to person, place, and time.     Gait: Gait abnormal.     Deep Tendon  Reflexes: Reflexes abnormal.  Psychiatric:        Mood and Affect: Mood normal.        Behavior: Behavior normal.        Thought Content: Thought content normal.        Judgment: Judgment normal.     BP 129/84 (BP Location: Left Arm, Patient Position: Sitting, Cuff Size: Large)    Pulse (!) 48    Temp 98 F (36.7 C) (Oral)    Ht _0  (1.803 m)    Wt 279 lb 3.2 oz (126.6 kg)    SpO2 97%    BMI 38.94 kg/m  Wt Readings from Last 3 Encounters:  01/13/20 279 lb 3.2 oz (126.6 kg)  10/14/19 264 lb (119.7 kg)  09/19/19 264 lb (119.7 kg)     Health Maintenance Due  Topic Date Due   OPHTHALMOLOGY EXAM  Never done   COVID-19 Vaccine (1) Never done   Fecal DNA (Cologuard)  Never done    There are no preventive care reminders to display for this patient.  No results found for: [facility] Lab Results  Component Value Date   WBC 7.5 08/28/2019   HGB 13.5 08/28/2019   HCT 41.1 08/28/2019   MCV 85.1 08/28/2019   PLT 125 (L) 08/28/2019   Lab Results  Component Value Date   NA 135 08/26/2019   K 4.1 08/26/2019   CO2 26 08/26/2019   GLUCOSE 321 (H) 08/26/2019   BUN 8 08/26/2019   CREATININE 0.86 08/26/2019   BILITOT 1.3 (H) 08/25/2019   ALKPHOS 96 08/25/2019   AST 25 08/25/2019   ALT 17 08/25/2019   PROT 7.3 08/25/2019   ALBUMIN 4.1 08/25/2019   CALCIUM 9.2 08/26/2019   ANIONGAP 10 08/26/2019   Lab Results  Component Value Date   CHOL 161 08/07/2018   Lab Results  Component Value Date   HDL 39 (L) 08/07/2018   Lab Results  Component Value Date   LDLCALC 88 08/07/2018   Lab Results  Component Value Date   TRIG 170 (H) 08/07/2018   Lab Results  Component Value Date   CHOLHDL 4.1 08/07/2018   Lab Results  Component Value Date   HGBA1C 14.3 (H) 08/25/2019      Assessment & Plan:  Reynald was seen today for diabetes.  Diagnoses and all orders for this visit: Khalib was seen today for diabetes.  Diagnoses and all orders for this visit:  Type 2 diabetes  mellitus with diabetic neuropathy, without long-term current use of insulin ([place]) ADA recommends the following therapeutic goals for glycemic control related to A1c measurements: Goal of therapy: Less than  6.5 hemoglobin A1c.  Reference clinical practice recommendations. Foods that are high in carbohydrates are the following rice, potatoes, breads, sugars, and pastas.  Reduction in the intake (eating) will assist in lowering your blood sugars. -     HgB A1c 7.2 -     Glucose (CBG) -     Microalbumin, urine -     Ambulatory referral to Optometry -     CBC with Differential/Platelet -     Lipid Panel -     CMP14+EGFR  Comprehensive diabetic foot examination, type 2 DM, encounter for [facility]) Completed see screening   Colon cancer screening Normal colon cancer screening.  CDC recommends colorectal screening from ages 48-75.  -     Ambulatory referral to Gastroenterology  Prostate cancer screening Prostate cancer is cancer that occurs in the prostate -- a small walnut-shaped gland in men that produces the seminal fluid that nourishes and transports sperm.Prostate cancer is one of the most common types of cancer in men. Usually prostate cancer grows slowly and is initially confined to the prostate gland, where it may not cause serious harm. Recommended age is 14.  [email or web address] -     PSA      Medication refill Meds ordered this encounter  Medications   lisinopril (ZESTRIL) 5 MG tablet    Sig: Take 1 tablet (5 mg total) by mouth daily.    Dispense:  90 tablet    Refill:  3   atorvastatin (LIPITOR) 20 MG tablet    Sig: Take 1 tablet (20 mg total) by mouth daily.    Dispense:  90 tablet    Refill:  1   metFORMIN (GLUCOPHAGE) 1000 MG tablet    Sig: Take 1 tablet (1,000 mg total) by mouth 2 (two) times daily with a meal.    Dispense:  180 tablet    Refill:  3   sitaGLIPtin (JANUVIA) 100 MG tablet    Sig: Take 1 tablet (100 mg total) by mouth daily.    Dispense:  90 tablet     Refill:  1   Insulin Pen Needle (TRUEPLUS PEN NEEDLES) 31G X 5 MM MISC    Sig: 31.5 mm by Does not apply route 3 times/day as needed-between meals & bedtime.    Dispense:  100 each    Refill:  11    Follow-up: No follow-ups on file.    Kerin Perna, NP

## 2020-01-13 NOTE — Patient Instructions (Signed)

## 2020-01-14 ENCOUNTER — Other Ambulatory Visit (INDEPENDENT_AMBULATORY_CARE_PROVIDER_SITE_OTHER): Payer: Self-pay | Admitting: Primary Care

## 2020-01-14 DIAGNOSIS — R972 Elevated prostate specific antigen [PSA]: Secondary | ICD-10-CM

## 2020-01-14 LAB — CMP14+EGFR
ALT: 19 IU/L (ref 0–44)
AST: 19 IU/L (ref 0–40)
Albumin/Globulin Ratio: 1.6 (ref 1.2–2.2)
Albumin: 4.7 g/dL (ref 3.8–4.9)
Alkaline Phosphatase: 107 IU/L (ref 48–121)
BUN/Creatinine Ratio: 12 (ref 10–24)
BUN: 13 mg/dL (ref 8–27)
Bilirubin Total: 0.2 mg/dL (ref 0.0–1.2)
CO2: 23 mmol/L (ref 20–29)
Calcium: 10.1 mg/dL (ref 8.6–10.2)
Chloride: 100 mmol/L (ref 96–106)
Creatinine, Ser: 1.12 mg/dL (ref 0.76–1.27)
GFR calc Af Amer: 82 mL/min/{1.73_m2} (ref >59)
GFR calc non Af Amer: 71 mL/min/{1.73_m2} (ref >59)
Globulin, Total: 3 g/dL (ref 1.5–4.5)
Glucose: 191 mg/dL — ABNORMAL HIGH (ref 65–99)
Potassium: 4.5 mmol/L (ref 3.5–5.2)
Sodium: 138 mmol/L (ref 134–144)
Total Protein: 7.7 g/dL (ref 6.0–8.5)

## 2020-01-14 LAB — CBC WITH DIFFERENTIAL/PLATELET
Basophils Absolute: 0.1 10*3/uL (ref 0.0–0.2)
Basos: 1 %
EOS (ABSOLUTE): 0.4 10*3/uL (ref 0.0–0.4)
Eos: 5 %
Hematocrit: 42.9 % (ref 37.5–51.0)
Hemoglobin: 14.4 g/dL (ref 13.0–17.7)
Immature Grans (Abs): 0 10*3/uL (ref 0.0–0.1)
Immature Granulocytes: 1 %
Lymphocytes Absolute: 2.5 10*3/uL (ref 0.7–3.1)
Lymphs: 28 %
MCH: 28.1 pg (ref 26.6–33.0)
MCHC: 33.6 g/dL (ref 31.5–35.7)
MCV: 84 fL (ref 79–97)
Monocytes Absolute: 0.5 10*3/uL (ref 0.1–0.9)
Monocytes: 6 %
Neutrophils Absolute: 5.2 10*3/uL (ref 1.4–7.0)
Neutrophils: 59 %
Platelets: 132 10*3/uL — ABNORMAL LOW (ref 150–450)
RBC: 5.13 x10E6/uL (ref 4.14–5.80)
RDW: 13.5 % (ref 11.6–15.4)
WBC: 8.8 10*3/uL (ref 3.4–10.8)

## 2020-01-14 LAB — LIPID PANEL
Chol/HDL Ratio: 4.5 ratio (ref 0.0–5.0)
Cholesterol, Total: 178 mg/dL (ref 100–199)
HDL: 40 mg/dL (ref >39)
LDL Chol Calc (NIH): 96 mg/dL (ref 0–99)
Triglycerides: 248 mg/dL — ABNORMAL HIGH (ref 0–149)
VLDL Cholesterol Cal: 42 mg/dL — ABNORMAL HIGH (ref 5–40)

## 2020-01-14 LAB — PSA: Prostate Specific Ag, Serum: 4.8 ng/mL — ABNORMAL HIGH (ref 0.0–4.0)

## 2020-01-14 LAB — MICROALBUMIN, URINE: Microalbumin, Urine: 49 ug/mL

## 2020-01-14 NOTE — Progress Notes (Signed)
u

## 2020-02-13 LAB — HM DIABETES EYE EXAM

## 2020-03-19 MED FILL — LANTUS SOLOSTAR 100 UNITS/M: 100 | 28 days supply | Qty: 3 | Fill #3

## 2020-04-14 ENCOUNTER — Other Ambulatory Visit (INDEPENDENT_AMBULATORY_CARE_PROVIDER_SITE_OTHER): Payer: Self-pay | Admitting: Primary Care

## 2020-04-14 ENCOUNTER — Encounter (INDEPENDENT_AMBULATORY_CARE_PROVIDER_SITE_OTHER): Payer: Self-pay | Admitting: Primary Care

## 2020-04-14 ENCOUNTER — Ambulatory Visit (INDEPENDENT_AMBULATORY_CARE_PROVIDER_SITE_OTHER): Payer: Self-pay | Admitting: Primary Care

## 2020-04-14 ENCOUNTER — Other Ambulatory Visit: Payer: Self-pay

## 2020-04-14 VITALS — BP 130/73 | HR 52 | Temp 97.3°F | Ht 71.0 in | Wt 279.2 lb

## 2020-04-14 DIAGNOSIS — E782 Mixed hyperlipidemia: Secondary | ICD-10-CM

## 2020-04-14 DIAGNOSIS — I10 Essential (primary) hypertension: Secondary | ICD-10-CM

## 2020-04-14 DIAGNOSIS — E1140 Type 2 diabetes mellitus with diabetic neuropathy, unspecified: Secondary | ICD-10-CM

## 2020-04-14 DIAGNOSIS — E119 Type 2 diabetes mellitus without complications: Secondary | ICD-10-CM

## 2020-04-14 DIAGNOSIS — Z1211 Encounter for screening for malignant neoplasm of colon: Secondary | ICD-10-CM

## 2020-04-14 DIAGNOSIS — Z794 Long term (current) use of insulin: Secondary | ICD-10-CM

## 2020-04-14 LAB — POCT CBG (FASTING - GLUCOSE)-MANUAL ENTRY: Glucose Fasting, POC: 150 mg/dL — AB (ref 70–99)

## 2020-04-14 LAB — POCT GLYCOSYLATED HEMOGLOBIN (HGB A1C): Hemoglobin A1C: 6.7 % — AB (ref 4.0–5.6)

## 2020-04-14 MED ORDER — METFORMIN HCL 500 MG PO TABS: 500.0000 mg | ORAL_TABLET | Freq: Two times a day (BID) | ORAL | 3 refills | Status: DC

## 2020-04-14 MED FILL — METFORMIN HCL 500 MG TABS: 500 | 90 days supply | Qty: 180 | Fill #0

## 2020-04-14 NOTE — Patient Instructions (Signed)
Stool for Occult Blood Test Why am I having this test? Stool for occult blood, or fecal occult blood test (FOBT), is a test that is used to check for gastrointestinal (GI) bleeding, which may be a sign of colon cancer. This test can also detect small amounts of blood in your stool (feces) from other causes, such as ulcers, bleeding blood vessels, or hemorrhoids. This test may be done as part of an annual routine exam to screen for colorectal cancer. Screening is recommended for all adults starting at age 77 and continuing until age 46. Your health care provider may recommend screening at age 52. What is being tested? This test checks for blood in your stool. What kind of sample is taken?  A stool sample is required for this test. Your health care provider may collect the sample with a swab of the rectum. Or, you may be instructed to collect the sample in a container at home. If you are instructed to collect the sample at home, your health care provider will give you the instructions and supplies that you will need. How do I collect samples at home? You may be asked to collect stool samples at home. Follow instructions from your health care provider about how to collect the samples. When collecting a stool sample at home, make sure you:  Use supplies and instructions that you received from the lab.  Have a bowel movement directly into a clean, dry container. Do not collect stool from the water in the toilet.  Transfer the sample into the germ-free (sterile) cup that you received from the lab.  Do not let any toilet paper or urine get into the cup.  Wash your hands with soap and water after collecting the sample.  Return the samples to the lab as instructed. How do I prepare for this test?  Do not eat any red meat during the 3 days before your test.  Follow instructions from your health care provider about eating and drinking before the test. Your health care provider may instruct you to  avoid other foods or substances. Tell a health care provider about:  All medicines you are taking, including vitamins, herbs, eye drops, creams, and over-the-counter medicines. You may be instructed to avoid certain medicines that are known to interfere with this test.  Any recent dental procedures you have had. How are the results reported? Your test results will be reported as either positive or negative for blood in your stool. What do the results mean? A negative test result means that there is no blood within the stool. A negative result is considered normal. A positive test result may mean that there is blood in the stool. Causes of blood in the stool include:  GI tumors.  Certain GI diseases.  GI trauma or recent surgery.  Hemorrhoids. If your test result is positive, additional tests may be needed to find the source of the bleeding. Talk with your health care provider about what your results mean. Questions to ask your health care provider Ask your health care provider, or the department that is doing the test:  When will my results be ready?  How will I get my results?  What are my treatment options?  What other tests do I need?  What are my next steps? Summary  Stool for occult blood, or fecal occult blood test (FOBT), is a test that is used to check for gastrointestinal (GI) bleeding, which may be a sign of colon cancer.  This test  can also detect small amounts of blood in your stool (feces) from other causes, such as ulcers, bleeding blood vessels, or hemorrhoids.  Your health care provider may collect the sample with a swab of the rectum. Or, you may be instructed to collect the sample in a container at home.  A positive test result may mean that there is blood in the stool. This information is not intended to replace advice given to you by your health care provider. Make sure you discuss any questions you have with your health care provider. Document Revised:  10/31/2018 Document Reviewed: 03/06/2017 Elsevier Patient Education  [place].

## 2020-04-14 NOTE — Progress Notes (Signed)
m °

## 2020-04-14 NOTE — Progress Notes (Signed)
Established Patient Office Visit  Subjective:  Patient ID: Spencer Robles, male    DOB: 31-Dec-1960  Age: 59 y.o. MRN: [id number]  CC:  Chief Complaint  Patient presents with  . Diabetes    HPI [name] is a 59 year old male presents for management of type 2 diabetes. He has neuropathy bilateral  hands making it difficult to grip and drops items.   Past Medical History:  Diagnosis Date  . Diabetes mellitus without complication ([place])   . Epididymitis 08/15/2017  . Hx of necrotising fasciitis 06/2017   RLE/notes 08/15/2017  . Scrotal swelling    left; due to Epididymitis/notes 08/15/2017  . Sleep apnea    does not use a cpap    Past Surgical History:  Procedure Laterality Date  . AMPUTATION TOE Right 07/06/2017   Procedure: RIGHT GREAT TOE AND SECOND TOE AMPUTATION;  Surgeon: Newt Minion, MD;  Location: [facility];  Service: Orthopedics;  Laterality: Right;  . CIRCUMCISION    . MASS BIOPSY  ~ 2008   scrotum/notes 08/15/2017  . SCROTAL EXPLORATION N/A 08/21/2017   Procedure: INCISION AND DRAINAGE SCROTAL ABSCESS, CYSTOSCOPY, COMPLEX INSERTION OF URETHERAL FOLEY CATHETER;  Surgeon: Raynelle Bring, MD;  Location: [facility];  Service: Urology;  Laterality: N/A;  . SCROTAL EXPLORATION N/A 08/30/2017   Procedure: SCROTUM DEBRIDEMENT;  Surgeon: Raynelle Bring, MD;  Location: [facility];  Service: Urology;  Laterality: N/A;    No family history on file.    Outpatient Medications Prior to Visit  Medication Sig Dispense Refill  . aspirin EC 81 MG tablet Take 1 tablet (81 mg total) by mouth daily. 90 tablet 0  . atorvastatin (LIPITOR) 20 MG tablet Take 1 tablet (20 mg total) by mouth daily. 90 tablet 1  . Blood Glucose Monitoring Suppl (TRUE METRIX METER) w/Device KIT Use to check blood sugar up to 3 times daily. 1 kit 0  . gabapentin (NEURONTIN) 300 MG capsule Take 2 capsules (600 mg total) by mouth 3 (three) times daily. 180 capsule 5  . glucose blood (TRUE METRIX BLOOD GLUCOSE TEST)  test strip Use as instructed to check blood sugar up to 3 times daily. 100 each 11  . ibuprofen (ADVIL) 800 MG tablet Take 800 mg by mouth every 6 (six) hours as needed for mild pain.     . Insulin Glargine (LANTUS SOLOSTAR) 100 UNIT/ML Solostar Pen Inject 10 Units into the skin daily. 5 pen PRN  . Insulin Pen Needle (TRUEPLUS PEN NEEDLES) 31G X 5 MM MISC 31.5 mm by Does not apply route 3 times/day as needed-between meals & bedtime. 100 each 11  . lisinopril (ZESTRIL) 5 MG tablet Take 1 tablet (5 mg total) by mouth daily. 90 tablet 3  . metFORMIN (GLUCOPHAGE) 1000 MG tablet Take 1 tablet (1,000 mg total) by mouth 2 (two) times daily with a meal. 180 tablet 3  . sitaGLIPtin (JANUVIA) 100 MG tablet Take 1 tablet (100 mg total) by mouth daily. 90 tablet 1  . TRUEplus Lancets 28G MISC Use as instructed to check blood sugar up to 3 times daily. 100 each 11   No facility-administered medications prior to visit.    No Known Allergies  ROS Review of Systems  Musculoskeletal: Positive for neck pain.  Neurological: Positive for numbness.       Tingling in hands and feet  All other systems reviewed and are negative.     Objective:    Physical Exam Vitals reviewed.  Constitutional:  Appearance: Normal appearance. He is obese.  HENT:     Head: Normocephalic.     Right Ear: Tympanic membrane normal.     Left Ear: Tympanic membrane normal.     Nose: Nose normal.  Eyes:     Extraocular Movements: Extraocular movements intact.  Cardiovascular:     Rate and Rhythm: Normal rate and regular rhythm.     Pulses: Normal pulses.     Heart sounds: Normal heart sounds.  Pulmonary:     Effort: Pulmonary effort is normal.     Breath sounds: Normal breath sounds.  Abdominal:     General: Bowel sounds are normal. There is distension.  Musculoskeletal:        General: Normal range of motion.     Cervical back: Normal range of motion and neck supple.  Skin:    General: Skin is warm and dry.   Neurological:     Mental Status: He is alert and oriented to person, place, and time.  Psychiatric:        Mood and Affect: Mood normal.        Behavior: Behavior normal.        Thought Content: Thought content normal.        Judgment: Judgment normal.     BP 130/73 (BP Location: Left Arm, Patient Position: Sitting, Cuff Size: Large)   Pulse (!) 52   Temp (!) 97.3 F (36.3 C) (Temporal)   Ht _0  (1.803 m)   Wt 279 lb 3.2 oz (126.6 kg)   SpO2 96%   BMI 38.94 kg/m  Wt Readings from Last 3 Encounters:  04/14/20 279 lb 3.2 oz (126.6 kg)  01/13/20 279 lb 3.2 oz (126.6 kg)  10/14/19 264 lb (119.7 kg)     Health Maintenance Due  Topic Date Due  . COVID-19 Vaccine (1) Never done  . Fecal DNA (Cologuard)  Never done    There are no preventive care reminders to display for this patient.  No results found for: TSH Lab Results  Component Value Date   WBC 8.8 01/13/2020   HGB 14.4 01/13/2020   HCT 42.9 01/13/2020   MCV 84 01/13/2020   PLT 132 (L) 01/13/2020   Lab Results  Component Value Date   NA 138 01/13/2020   K 4.5 01/13/2020   CO2 23 01/13/2020   GLUCOSE 191 (H) 01/13/2020   BUN 13 01/13/2020   CREATININE 1.12 01/13/2020   BILITOT 0.2 01/13/2020   ALKPHOS 107 01/13/2020   AST 19 01/13/2020   ALT 19 01/13/2020   PROT 7.7 01/13/2020   ALBUMIN 4.7 01/13/2020   CALCIUM 10.1 01/13/2020   ANIONGAP 10 08/26/2019   Lab Results  Component Value Date   CHOL 178 01/13/2020   Lab Results  Component Value Date   HDL 40 01/13/2020   Lab Results  Component Value Date   LDLCALC 96 01/13/2020   Lab Results  Component Value Date   TRIG 248 (H) 01/13/2020   Lab Results  Component Value Date   CHOLHDL 4.5 01/13/2020   Lab Results  Component Value Date   HGBA1C 7.2 (A) 01/13/2020      Assessment & Plan:  Rayhaan was seen today for diabetes.  Diagnoses and all orders for this visit:  Type 2 diabetes mellitus without complication, with long-term  current use of insulin (HCC)  Goal of therapy: meet Less than 6.5 hemoglobin  Continue monitoring foods that are high in carbohydrates are the following rice, potatoes, breads,  sugars, and pastas.  Reduction in the intake (eating) will assist in lowering your blood sugars. -     HgB A1c 6.5 -     Glucose (CBG), Fasting -     metFORMIN (GLUCOPHAGE) 500 MG tablet; Take 1 tablet (500 mg total) by mouth 2 (two) times daily with a meal.  Special screening for malignant neoplasms, colon -     Fecal occult blood, imunochemical([facility]); Future  Essential hypertension Lisinopril ACE inhibitor renal protection recommended by ADA    No orders of the defined types were placed in this encounter.   Follow-up: No follow-ups on file.    Kerin Perna, NP

## 2020-04-15 LAB — CBC WITH DIFFERENTIAL/PLATELET
Basophils Absolute: 0.1 10*3/uL (ref 0.0–0.2)
Basos: 1 %
EOS (ABSOLUTE): 0.4 10*3/uL (ref 0.0–0.4)
Eos: 5 %
Hematocrit: 40.5 % (ref 37.5–51.0)
Hemoglobin: 14.3 g/dL (ref 13.0–17.7)
Immature Grans (Abs): 0 10*3/uL (ref 0.0–0.1)
Immature Granulocytes: 0 %
Lymphocytes Absolute: 2.1 10*3/uL (ref 0.7–3.1)
Lymphs: 25 %
MCH: 29.5 pg (ref 26.6–33.0)
MCHC: 35.3 g/dL (ref 31.5–35.7)
MCV: 84 fL (ref 79–97)
Monocytes Absolute: 0.6 10*3/uL (ref 0.1–0.9)
Monocytes: 7 %
Neutrophils Absolute: 5.4 10*3/uL (ref 1.4–7.0)
Neutrophils: 62 %
Platelets: 127 10*3/uL — ABNORMAL LOW (ref 150–450)
RBC: 4.85 x10E6/uL (ref 4.14–5.80)
RDW: 12.7 % (ref 11.6–15.4)
WBC: 8.5 10*3/uL (ref 3.4–10.8)

## 2020-04-15 LAB — CMP14+EGFR
ALT: 20 IU/L (ref 0–44)
AST: 15 IU/L (ref 0–40)
Albumin/Globulin Ratio: 1.7 (ref 1.2–2.2)
Albumin: 4.6 g/dL (ref 3.8–4.8)
Alkaline Phosphatase: 116 IU/L (ref 44–121)
BUN/Creatinine Ratio: 12 (ref 10–24)
BUN: 12 mg/dL (ref 8–27)
Bilirubin Total: 0.6 mg/dL (ref 0.0–1.2)
CO2: 24 mmol/L (ref 20–29)
Calcium: 9.7 mg/dL (ref 8.6–10.2)
Chloride: 102 mmol/L (ref 96–106)
Creatinine, Ser: 1.01 mg/dL (ref 0.76–1.27)
GFR calc Af Amer: 92 mL/min/{1.73_m2} (ref >59)
GFR calc non Af Amer: 80 mL/min/{1.73_m2} (ref >59)
Globulin, Total: 2.7 g/dL (ref 1.5–4.5)
Glucose: 155 mg/dL — ABNORMAL HIGH (ref 65–99)
Potassium: 4.1 mmol/L (ref 3.5–5.2)
Sodium: 142 mmol/L (ref 134–144)
Total Protein: 7.3 g/dL (ref 6.0–8.5)

## 2020-04-15 LAB — LIPID PANEL
Chol/HDL Ratio: 2.7 ratio (ref 0.0–5.0)
Cholesterol, Total: 111 mg/dL (ref 100–199)
HDL: 41 mg/dL (ref >39)
LDL Chol Calc (NIH): 55 mg/dL (ref 0–99)
Triglycerides: 71 mg/dL (ref 0–149)
VLDL Cholesterol Cal: 15 mg/dL (ref 5–40)

## 2020-04-23 MED FILL — LANTUS SOLOSTAR 100 UNITS/M: 100 | 28 days supply | Qty: 3 | Fill #4

## 2020-06-15 MED FILL — JANUVIA 100 MG TABLET: 100 | 90 days supply | Qty: 90 | Fill #1

## 2020-06-15 MED FILL — LANTUS SOLOSTAR 100 UNITS/M: 100 | 28 days supply | Qty: 3 | Fill #5

## 2020-07-26 ENCOUNTER — Telehealth: Payer: Self-pay | Admitting: *Deleted

## 2020-07-26 NOTE — Telephone Encounter (Signed)
error 

## 2020-07-29 MED FILL — LANTUS SOLOSTAR 100 UNITS/M: 100 | 28 days supply | Qty: 3 | Fill #6

## 2020-10-11 ENCOUNTER — Other Ambulatory Visit (INDEPENDENT_AMBULATORY_CARE_PROVIDER_SITE_OTHER): Payer: Self-pay | Admitting: Primary Care

## 2020-10-11 DIAGNOSIS — Z794 Long term (current) use of insulin: Secondary | ICD-10-CM

## 2020-10-11 DIAGNOSIS — E1140 Type 2 diabetes mellitus with diabetic neuropathy, unspecified: Secondary | ICD-10-CM

## 2020-10-11 DIAGNOSIS — E119 Type 2 diabetes mellitus without complications: Secondary | ICD-10-CM

## 2020-10-11 MED FILL — LISINOPRIL 5 MG TABLET: 5 | 90 days supply | Qty: 90 | Fill #1

## 2020-10-11 MED FILL — METFORMIN HCL 500 MG TABS: 500 | 90 days supply | Qty: 180 | Fill #0

## 2020-10-11 NOTE — Telephone Encounter (Signed)
Notes to clinic: Patient has upcoming appt tomorrow    Requested Prescriptions  Pending Prescriptions Disp Refills   JANUVIA 100 MG tablet [Pharmacy Med Name: JANUVIA 100 MG TABLET 100 Tablet] 90 tablet 1    Sig: TAKE 1 TABLET (100 MG TOTAL) BY MOUTH DAILY.      Endocrinology:  Diabetes - DPP-4 Inhibitors Passed - 10/11/2020  2:41 PM      Passed - HBA1C is between 0 and 7.9 and within 180 days    Hemoglobin A1C  Date Value Ref Range Status  04/14/2020 6.7 (A) 4.0 - 5.6 % Final   Hgb A1c MFr Bld  Date Value Ref Range Status  08/25/2019 14.3 (H) 4.8 - 5.6 % Final    Comment:    (NOTE) Pre diabetes:          5.7%-6.4% Diabetes:              >6.4% Glycemic control for   <7.0% adults with diabetes           Passed - Cr in normal range and within 360 days    Creat  Date Value Ref Range Status  08/17/2015 0.92 0.70 - 1.33 mg/dL Final   Creatinine, Ser  Date Value Ref Range Status  04/14/2020 1.01 0.76 - 1.27 mg/dL Final   Creatinine, Urine  Date Value Ref Range Status  08/16/2017 142.74 mg/dL Final          Passed - Valid encounter within last 6 months    Recent Outpatient Visits           6 months ago Type 2 diabetes mellitus without complication, with long-term current use of insulin ([place])   [place] [facility] Juluis Mire P, NP   9 months ago Type 2 diabetes mellitus with diabetic neuropathy, without long-term current use of insulin ([place])   [place] [facility] Kerin Perna, NP   1 year ago Type 2 diabetes mellitus without complication, with long-term current use of insulin ([place])   [place] [facility] Juluis Mire P, NP   1 year ago Type 2 diabetes mellitus without complication, with long-term current use of insulin ([place])   [place] Ausdall, [place] L, RPH-CPP   1 year ago Type 2 diabetes mellitus without complication, without long-term current use of insulin [facility])    [place], Jarome Matin, RPH-CPP       Future Appointments             Tomorrow Scot Jun, FNP [facility]               LANTUS SOLOSTAR 100 UNIT/ML Solostar Pen [Pharmacy Med Name: LANTUS SOLOSTAR 100 UNITS/M 100 Solution Pen-injector] 3 mL PRN    Sig: Inject 10 Units into the skin daily.      Endocrinology:  Diabetes - Insulins Passed - 10/11/2020  2:41 PM      Passed - HBA1C is between 0 and 7.9 and within 180 days    Hemoglobin A1C  Date Value Ref Range Status  04/14/2020 6.7 (A) 4.0 - 5.6 % Final   Hgb A1c MFr Bld  Date Value Ref Range Status  08/25/2019 14.3 (H) 4.8 - 5.6 % Final    Comment:    (NOTE) Pre diabetes:          5.7%-6.4% Diabetes:              >  6.4% Glycemic control for   <7.0% adults with diabetes           Passed - Valid encounter within last 6 months    Recent Outpatient Visits           6 months ago Type 2 diabetes mellitus without complication, with long-term current use of insulin ([place])   [place] [facility] Juluis Mire P, NP   9 months ago Type 2 diabetes mellitus with diabetic neuropathy, without long-term current use of insulin ([place])   [place] [facility] Juluis Mire P, NP   1 year ago Type 2 diabetes mellitus without complication, with long-term current use of insulin ([place])   [place] [facility] Kerin Perna, NP   1 year ago Type 2 diabetes mellitus without complication, with long-term current use of insulin ([place])   [place], Jarome Matin, RPH-CPP   1 year ago Type 2 diabetes mellitus without complication, without long-term current use of insulin [facility]   [place], Jarome Matin, RPH-CPP       Future Appointments             Tomorrow Scot Jun, FNP [facility]

## 2020-10-12 ENCOUNTER — Ambulatory Visit (INDEPENDENT_AMBULATORY_CARE_PROVIDER_SITE_OTHER): Payer: Medicaid Other | Admitting: Family Medicine

## 2020-10-15 NOTE — Telephone Encounter (Signed)
Pt called back to report that he is still missing his insulin, please advise

## 2020-10-18 ENCOUNTER — Telehealth (INDEPENDENT_AMBULATORY_CARE_PROVIDER_SITE_OTHER): Payer: Self-pay | Admitting: Primary Care

## 2020-10-18 NOTE — Telephone Encounter (Signed)
Medication: Insulin Glargine (LANTUS SOLOSTAR) 100 UNIT/ML Solostar Pen [[id number]] ,sitaGLIPtin (JANUVIA) 100 MG tablet [[id number]]    Has the patient contacted their pharmacy? YES (Agent: If no, request that the patient contact the pharmacy for the refill.) (Agent: If yes, when and what did the pharmacy advise?)  Preferred Pharmacy (with phone number or street name): [facility], [address]  [place], [place] [address]  Phone:  [phone] Fax:  [phone]   Agent: Please be advised that RX refills may take up to 3 business days. We ask that you follow-up with your pharmacy.

## 2020-10-20 ENCOUNTER — Other Ambulatory Visit: Payer: Self-pay | Admitting: Pharmacist

## 2020-10-20 ENCOUNTER — Other Ambulatory Visit: Payer: Self-pay

## 2020-10-20 ENCOUNTER — Ambulatory Visit (INDEPENDENT_AMBULATORY_CARE_PROVIDER_SITE_OTHER): Payer: Medicare Other | Admitting: Primary Care

## 2020-10-20 ENCOUNTER — Encounter (INDEPENDENT_AMBULATORY_CARE_PROVIDER_SITE_OTHER): Payer: Self-pay | Admitting: Primary Care

## 2020-10-20 VITALS — BP 139/95 | HR 70 | Resp 16 | Wt 278.6 lb

## 2020-10-20 DIAGNOSIS — Z794 Long term (current) use of insulin: Secondary | ICD-10-CM

## 2020-10-20 DIAGNOSIS — R202 Paresthesia of skin: Secondary | ICD-10-CM

## 2020-10-20 DIAGNOSIS — M79602 Pain in left arm: Secondary | ICD-10-CM

## 2020-10-20 DIAGNOSIS — I10 Essential (primary) hypertension: Secondary | ICD-10-CM | POA: Diagnosis not present

## 2020-10-20 DIAGNOSIS — E1140 Type 2 diabetes mellitus with diabetic neuropathy, unspecified: Secondary | ICD-10-CM

## 2020-10-20 DIAGNOSIS — T148XXA Other injury of unspecified body region, initial encounter: Secondary | ICD-10-CM

## 2020-10-20 DIAGNOSIS — E119 Type 2 diabetes mellitus without complications: Secondary | ICD-10-CM

## 2020-10-20 DIAGNOSIS — M79601 Pain in right arm: Secondary | ICD-10-CM

## 2020-10-20 LAB — POCT GLYCOSYLATED HEMOGLOBIN (HGB A1C): HbA1c, POC (controlled diabetic range): 9.7 % — AB (ref 0.0–7.0)

## 2020-10-20 LAB — GLUCOSE, POCT (MANUAL RESULT ENTRY): POC Glucose: 193 mg/dl — AB (ref 70–99)

## 2020-10-20 MED ORDER — GABAPENTIN 300 MG PO CAPS: 600.0000 mg | ORAL_CAPSULE | Freq: Three times a day (TID) | ORAL | 5 refills | Status: DC

## 2020-10-20 MED ORDER — LINAGLIPTIN 5 MG PO TABS: 5.0000 mg | ORAL_TABLET | Freq: Every day | ORAL | 2 refills | Status: DC

## 2020-10-20 MED ORDER — LISINOPRIL 20 MG PO TABS: 20.0000 mg | ORAL_TABLET | Freq: Every day | ORAL | 1 refills | Status: DC

## 2020-10-20 MED ORDER — BASAGLAR KWIKPEN 100 UNIT/ML ~~LOC~~ SOPN: 20.0000 [IU] | PEN_INJECTOR | Freq: Every day | SUBCUTANEOUS | 2 refills | Status: DC

## 2020-10-20 NOTE — Progress Notes (Signed)
Spencer Robles is a 60 y.o. male who presents for an follow up evaluation of Type 2 diabetes mellitus.  Current symptoms/problems include paresthesia of the feet and visual disturbances and have been worsening. Symptoms have been present for 3 month.  Current diabetic medications include oral agents therapy changed due to insurance denial. The patient was initially diagnosed with Type 2 diabetes mellitus based on the following criteria:  ADA guidelines ( 4th toe foot ulcer from shoe rubbing came in with flip flaps Current monitoring regimen: home blood tests - daily Home blood sugar records: fasting range: 150-190 Any episodes of hypoglycemia? 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  Yes   Is He on ACE inhibitor or angiotensin II receptor blocker?  Yes  lisinopril (Prinivil)   Review of Systems  Skin:       abrasion   All other systems reviewed and are negative.   Objective:    BP (!) 139/95   Pulse 70   Resp 16   Wt 278 lb 9.6 oz (126.4 kg)   SpO2 97%   BMI 38.86 kg/m   Physical Exam   Lab Review Glucose (mg/dL)  Date Value  04/14/2020 155 (H)  01/13/2020 191 (H)  05/08/2018 123 (H)   Glucose, Bld (mg/dL)  Date Value  08/26/2019 321 (H)  08/25/2019 390 (H)  04/16/2018 95   CO2 (mmol/L)  Date Value  04/14/2020 24  01/13/2020 23  08/26/2019 26   BUN (mg/dL)  Date Value  04/14/2020 12  01/13/2020 13  08/26/2019 8  08/25/2019 11  05/08/2018 10   Creat (mg/dL)  Date Value  08/17/2015 0.92  04/15/2015 0.96    Creatinine, Ser (mg/dL)  Date Value  04/14/2020 1.01  01/13/2020 1.12  08/26/2019 0.86      Assessment:    Diabetes Mellitus type II, under poor control.   Callin was seen today for diabetes and hypertension.  Diagnoses and all orders for this visit:  Type 2 diabetes mellitus without complication, with long-term current use of insulin (HCC) -     HgB A1c  9.7  -     Glucose (CBG) Discussed  co- morbidities with uncontrol diabetes  Complications -diabetic retinopathy, (close your eyes ? What do you see nothing) nephropathy decrease in kidney function- can lead to dialysis-on a machine 3 days a week to filter your kidney, neuropathy- numbness and tinging in your hands and feet,  increase risk of heart attack and stroke, and amputation due to decrease wound healing and circulation. Decrease your risk by taking medication daily as prescribed, monitor carbohydrates- foods that are high in carbohydrates are the following rice, potatoes, breads, sugars, and pastas.  Reduction in the intake (eating) will assist in lowering your blood sugars. Exercise daily at least 30 minutes daily.  Paresthesia and pain of both upper extremities Gabapentin increased to 300 mg 3 times a day.  Paresthesia of both lower extremities Gabapentin increased to 300 mg 3 times a day.  Essential hypertension Counseled on blood pressure goal of less than 130/80, low-sodium, DASH diet, medication compliance, 150 minutes of moderate  intensity exercise per week. Discussed medication compliance, adverse effects.  Abrasion 3rd toe has rubbed against shoe and small area black diminished bilateral pedal pulses. No s/s of infection . Has a hx of amputation. Monitor and watch closely advised keep covered and clean daily. Any changes call clinic immediately      Plan:    1.  Rx changes: yes 2.  Education: Reviewed 'ABCs' of diabetes management (respective goals in parentheses):  A1C (<7), blood pressure (<130/80),  and cholesterol (LDL <100). 3. Discussed pathophysiology of DM;  4. CHO counting diet discussed.  Reviewed CHO amount in various foods and how to read nutrition labels.  Discussed recommended serving sizes.  5.  Recommend check BG 3  times a day 6.  Recommended increase physical activity - goal is 150 minutes per week 7. Follow up: 3 months

## 2020-10-23 ENCOUNTER — Other Ambulatory Visit (HOSPITAL_COMMUNITY): Payer: Self-pay

## 2020-10-23 ENCOUNTER — Other Ambulatory Visit (HOSPITAL_BASED_OUTPATIENT_CLINIC_OR_DEPARTMENT_OTHER): Payer: Self-pay

## 2020-10-23 ENCOUNTER — Other Ambulatory Visit: Payer: Self-pay

## 2020-10-23 MED FILL — Insulin Glargine Soln Pen-Injector 100 Unit/ML: SUBCUTANEOUS | 75 days supply | Qty: 15 | Fill #0 | Status: AC

## 2020-10-23 MED FILL — Linagliptin Tab 5 MG: ORAL | 30 days supply | Qty: 30 | Fill #0 | Status: AC

## 2020-10-23 MED FILL — Lisinopril Tab 20 MG: ORAL | 90 days supply | Qty: 90 | Fill #0 | Status: AC

## 2020-10-23 MED FILL — Gabapentin Cap 300 MG: ORAL | 30 days supply | Qty: 180 | Fill #0 | Status: AC

## 2020-10-24 ENCOUNTER — Other Ambulatory Visit: Payer: Self-pay

## 2020-10-24 NOTE — Telephone Encounter (Signed)
Patient seen in office issue was insurance no longer covered medications and had to be changed

## 2020-10-25 ENCOUNTER — Other Ambulatory Visit (HOSPITAL_COMMUNITY): Payer: Self-pay

## 2020-10-25 ENCOUNTER — Ambulatory Visit: Payer: Medicaid Other | Attending: Primary Care | Admitting: Pharmacist

## 2020-10-25 ENCOUNTER — Other Ambulatory Visit: Payer: Self-pay

## 2020-10-25 DIAGNOSIS — Z794 Long term (current) use of insulin: Secondary | ICD-10-CM | POA: Diagnosis not present

## 2020-10-25 DIAGNOSIS — E119 Type 2 diabetes mellitus without complications: Secondary | ICD-10-CM

## 2020-10-25 LAB — GLUCOSE, POCT (MANUAL RESULT ENTRY): POC Glucose: 190 mg/dl — AB (ref 70–99)

## 2020-10-25 NOTE — Progress Notes (Signed)
    S:    PCP: Juluis Mire   No chief complaint on file.  Patient arrives in good spirits. Presents for diabetes evaluation, education, and management at the request of Sharyn Lull. Patient was referred on 10/20/2020. At that visit, Sharyn Lull changed Januvia to [employer]. She also increased Basaglar to 20 units daily. Pt's A1c was 9.7 at that visit.   Patient reports diabetes was diagnosed in 2007.   Family/Social History:  - FHx: no pertinent positives listed in [facility] - Tobacco: never smoker - Alcohol: denies use  Insurance coverage/medication affordability: [place] Medicaid  Patient reports adherence with metformin and insulin glargine 10u daily. He has not picked up the [place] yet.   Current diabetes medications include: Tradjenta 5 mg daily (has not started), Basaglar 20 units daily (still taking 10u daily), metformin 500 mg BID   Patient denies hypoglycemic events.  Patient reported dietary habits: Eats 3 meals/day - Reports eating mainly beans and vegetables  - Switched from white bread to grain bread - Limits potatoes  - Admits to eating candy seldomly - denies drinking soda or sweet tea  Patient-reported exercise habits:  - Walks daily ~20 minutes    Patient denies nocturia.  Patient reports neuropathy. Patient reports visual changes. Patient reports self foot exams.   O:  POCT: 190   Home CBGs:  - No meter with him  - Reports that blood sugars range mostly in the 180s-190s.   Lab Results  Component Value Date   HGBA1C 9.7 (A) 10/20/2020   There were no vitals filed for this visit.  Lipid Panel     Component Value Date/Time   CHOL 111 04/14/2020 0913   TRIG 71 04/14/2020 0913   HDL 41 04/14/2020 0913   CHOLHDL 2.7 04/14/2020 0913   CHOLHDL 3.0 04/15/2015 1219   VLDL 13 04/15/2015 1219   LDLCALC 55 04/14/2020 0913   Clinical ASCVD: No  The ASCVD Risk score Mikey Bussing DC Jr., et al., 2013) failed to calculate for the following  reasons:   The valid total cholesterol range is 130 to 320 mg/dL    A/P: Diabetes longstanding currently uncontrolled. Patient is able to verbalize appropriate hypoglycemia management plan. Patient is not fully adherent with medication. Encouraged him to pick-up and begin [place]. Encouraged him to pick-up and start 20u of Basaglar daily.  -Continued current regimen. Pt encouraged to pick-up meds and take as prescribed.  -Extensively discussed pathophysiology of DM, recommended lifestyle interventions, dietary effects on glycemic control -Counseled on s/sx of and management of hypoglycemia -Next A1C anticipated 12/2020.  ASCVD risk - primary prevention in patient with DM. Last LDL well controlled. ASCVD risk score is not >20%. Mod intensity statin okay to continue.  -Continue atorvastatin 20 mg daily.   Written patient instructions provided.  Total time in face to face counseling 15 minutes.   Follow up Pharmacist Clinic Visit in 1 month.     Benard Halsted, PharmD, [place] [phone]

## 2020-11-23 NOTE — Progress Notes (Signed)
    S:     PCP: Juluis Mire   Patient arrives in good spirits. Presents for diabetes evaluation, education, and management at the request of Sharyn Lull. Patient was referred on 10/20/2020. Pt last seen by pharmacy on 10/25/20. At that visit, pt reported not picking up Tradjenta yet and taking Basaglar 10 units daily instead of 20 units as prescribed. Reported home BG 180-190s. Pt encouraged to pick up Tradjenta and start taking Basaglar 20 units daily as prescribed.  Patient reports diabetes was diagnosed in 2007.   Today, patient reports medication adherence with Tradjenta and Basaglar 20 units daily. Reports missing 1-2 doses in the last two weeks. Reports FBG range 115-150 and PPBG range 150-180s. Denies BG <70.   Family/Social History:  - FHx: no pertinent positives listed in [facility] - Tobacco: never smoker - Alcohol: denies use  Insurance coverage/medication affordability: [place] Medicaid  Medication adherence reported good.   Current diabetes medications include: Tradjenta 5 mg daily, Basaglar 20 units daily, metformin 500 mg BID   Patient denies hypoglycemic events.  Patient reported dietary habits: Eats 3 meals/day - Reports eating mainly beans and vegetables  - Switched from white bread to grain bread - Limits potatoes  - Admits to eating candy eldomly - denies drinking soda or sweet tea  Patient-reported exercise habits:  - Walks daily ~20 minutes    Patient denies nocturia (nighttime urination).  Patient reports neuropathy (nerve pain). Patient denies visual changes. Patient reports self foot exams.     O:  POCT: 142 (fasting)  Home fasting blood sugars: 115-150 2 hour post-meal/random blood sugars: 150-18   Lab Results  Component Value Date   HGBA1C 9.7 (A) 10/20/2020   There were no vitals filed for this visit.  Lipid Panel     Component Value Date/Time   CHOL 111 04/14/2020 0913   TRIG 71 04/14/2020 0913   HDL 41 04/14/2020 0913   CHOLHDL 2.7  04/14/2020 0913   CHOLHDL 3.0 04/15/2015 1219   VLDL 13 04/15/2015 1219   LDLCALC 55 04/14/2020 0913   Clinical Atherosclerotic Cardiovascular Disease (ASCVD): No  The ASCVD Risk score Mikey Bussing DC Jr., et al., 2013) failed to calculate for the following reasons:   The valid total cholesterol range is 130 to 320 mg/dL    A/P: Diabetes longstanding currently uncontrolled, however blood glucose improving since starting Tradjenta. Medication adherence appears optimal. Will switch Tradjenta (JAS5-K) to weekly Trulicity (GLP1 RA)  for better BG control and increased medication adherence. Discussed clinical benefits and potential side effects. Demonstrated porper injection technique with teach-back method. Patient verbalized understanding. Patient is able to verbalize appropriate hypoglycemia management plan. -Started Trulicity 5.39 mg weekly -Discontinued Tradjenta -Continued Basaglar 20 units daily -Extensively discussed pathophysiology of diabetes, recommended lifestyle interventions, dietary effects on blood sugar control -Counseled on s/sx of and management of hypoglycemia -Next A1C anticipated 12/2020  ASCVD risk - primary prevention in patient with DM. Last LDL well controlled. ASCVD risk score is not >20%. Mod intensity statin okay to continue.  -Continue atorvastatin 20 mg daily  Written patient instructions provided.  Total time in face to face counseling 25 minutes.   Follow up Pharmacist Clinic Visit in 4 weeks.    Lorel [place], PharmD, [place] PGY2 Ambulatory Care Resident [place]

## 2020-11-24 ENCOUNTER — Other Ambulatory Visit: Payer: Self-pay

## 2020-11-24 ENCOUNTER — Ambulatory Visit: Payer: Medicaid Other | Attending: Primary Care | Admitting: Pharmacist

## 2020-11-24 ENCOUNTER — Encounter: Payer: Self-pay | Admitting: Pharmacist

## 2020-11-24 DIAGNOSIS — E119 Type 2 diabetes mellitus without complications: Secondary | ICD-10-CM | POA: Diagnosis not present

## 2020-11-24 DIAGNOSIS — Z794 Long term (current) use of insulin: Secondary | ICD-10-CM

## 2020-11-24 LAB — GLUCOSE, POCT (MANUAL RESULT ENTRY): POC Glucose: 142 mg/dl — AB (ref 70–99)

## 2020-11-24 MED ORDER — TRULICITY 0.75 MG/0.5ML ~~LOC~~ SOAJ
0.7500 mg | SUBCUTANEOUS | 1 refills | Status: DC
  Filled 2020-11-24: qty 2, 28d supply, fill #0

## 2020-12-13 ENCOUNTER — Other Ambulatory Visit: Payer: Self-pay

## 2020-12-13 ENCOUNTER — Telehealth: Payer: Self-pay

## 2020-12-13 MED ORDER — VICTOZA 18 MG/3ML ~~LOC~~ SOPN
PEN_INJECTOR | SUBCUTANEOUS | 3 refills | Status: DC
  Filled 2020-12-13 – 2020-12-27 (×3): qty 6, 30d supply, fill #0

## 2020-12-13 NOTE — Telephone Encounter (Signed)
Trulicity has been changed to [employer]

## 2020-12-13 NOTE — Telephone Encounter (Signed)
Insurance prefers Victoza.  If appropriate, please change Trulicity therapy to Victoza.

## 2020-12-13 NOTE — Addendum Note (Signed)
Addended byCharlott Rakes on: 12/13/2020 01:16 PM   Modules accepted: Orders

## 2020-12-24 ENCOUNTER — Other Ambulatory Visit: Payer: Self-pay | Admitting: Primary Care

## 2020-12-24 ENCOUNTER — Other Ambulatory Visit: Payer: Self-pay

## 2020-12-24 DIAGNOSIS — E119 Type 2 diabetes mellitus without complications: Secondary | ICD-10-CM

## 2020-12-27 ENCOUNTER — Ambulatory Visit: Payer: Medicaid Other | Attending: Primary Care | Admitting: Pharmacist

## 2020-12-27 ENCOUNTER — Other Ambulatory Visit: Payer: Self-pay

## 2020-12-27 ENCOUNTER — Encounter: Payer: Self-pay | Admitting: Pharmacist

## 2020-12-27 DIAGNOSIS — Z794 Long term (current) use of insulin: Secondary | ICD-10-CM | POA: Diagnosis not present

## 2020-12-27 DIAGNOSIS — B351 Tinea unguium: Secondary | ICD-10-CM

## 2020-12-27 DIAGNOSIS — E119 Type 2 diabetes mellitus without complications: Secondary | ICD-10-CM | POA: Diagnosis not present

## 2020-12-27 LAB — GLUCOSE, POCT (MANUAL RESULT ENTRY): POC Glucose: 150 mg/dl — AB (ref 70–99)

## 2020-12-27 MED ORDER — TRULICITY 1.5 MG/0.5ML ~~LOC~~ SOAJ
1.5000 mg | SUBCUTANEOUS | 2 refills | Status: DC
  Filled 2020-12-27 – 2021-01-03 (×2): qty 2, 28d supply, fill #0
  Filled 2021-01-28: qty 2, 28d supply, fill #1
  Filled 2021-03-03: qty 2, 28d supply, fill #2

## 2020-12-27 MED ORDER — TERBINAFINE HCL 250 MG PO TABS
250.0000 mg | ORAL_TABLET | Freq: Every day | ORAL | 0 refills | Status: DC
  Filled 2020-12-27: qty 90, 90d supply, fill #0

## 2020-12-27 NOTE — Addendum Note (Signed)
Addended by: Daisy Blossom, Annie Main L on: 12/27/2020 02:57 PM   Modules accepted: Orders

## 2020-12-27 NOTE — Progress Notes (Addendum)
    S:     PCP: Juluis Mire   Patient arrives in good spirits. Presents for diabetes evaluation, education, and management at the request of Sharyn Lull. Patient was referred on 10/20/2020. Pt last seen by pharmacy on 11/24/20. At that visit, pt was changed from linagliptin to Trulicity.His Basaglar and metformin was continued with no dose changes. Of note, his Trulicity was changed to Victoza on 12/13/2020 d/t insurance.   Today, pt tells me he received and completed 4 doses of Trulicity 4.58 mg. He never picked-up the Victoza. I found out from our pharmacy that Trulicity is covered but requires prior approval. The Victoza is NOT covered.   Patient reports diabetes was diagnosed in 2007.   Family/Social History:  - FHx: no pertinent positives listed in [facility] - Tobacco: never smoker - Alcohol: denies use  Insurance coverage/medication affordability: [place] [employer]  Medication adherence reported.   Current diabetes medications include: Victoza (not taking), Basaglar 20 units daily, metformin 500 mg BID   Patient denies hypoglycemic events.  Patient reported dietary habits: Eats 3 meals/day - Reports eating mainly beans and vegetables  - Switched from white bread to grain bread - Limits potatoes  - Admits to eating candy eldomly - denies drinking soda or sweet tea  Patient-reported exercise habits:  - Walks daily ~20 minutes    Patient denies nocturia (nighttime urination).  Patient reports neuropathy (nerve pain). Patient denies visual changes. Patient reports self foot exams.     O:  POCT: 150 (post-prandial)  Home fasting blood sugars: 110s-130s 2 hour post-meal/random blood sugars: 150s-180s   Lab Results  Component Value Date   HGBA1C 9.7 (A) 10/20/2020   There were no vitals filed for this visit.  Lipid Panel     Component Value Date/Time   CHOL 111 04/14/2020 0913   TRIG 71 04/14/2020 0913   HDL 41 04/14/2020 0913   CHOLHDL 2.7 04/14/2020 0913   CHOLHDL 3.0  04/15/2015 1219   VLDL 13 04/15/2015 1219   LDLCALC 55 04/14/2020 0913   Clinical Atherosclerotic Cardiovascular Disease (ASCVD): No  The ASCVD Risk score Mikey Bussing DC Jr., et al., 2013) failed to calculate for the following reasons:   The valid total cholesterol range is 130 to 320 mg/dL    A/P: Diabetes longstanding currently uncontrolled. I have reviewed with him that we stick with Trulicity. He has completed 1 month of 0.75 mg weekly, therefore, we will increase to 1.5 mg weekly. Patient verbalized understanding. Patient is able to verbalize appropriate hypoglycemia management plan. -Continued Basaglar 20 units daily, continue metformin 500 mg BID.  -Increase Trulicity 1.5 mg weekly.  -Extensively discussed pathophysiology of diabetes, recommended lifestyle interventions, dietary effects on blood sugar control -Counseled on s/sx of and management of hypoglycemia -Next A1C anticipated 01/20/2021.  ASCVD risk - primary prevention in patient with DM. Last LDL well controlled. ASCVD risk score is not >20%. Mod intensity statin okay to continue.  -Continue atorvastatin 20 mg daily  Written patient instructions provided.  Total time in face to face counseling 25 minutes.   Follow up PCP Clinic Visit in 3 weeks.    Benard Halsted, PharmD, [place], [place] [phone]

## 2020-12-28 LAB — HEPATIC FUNCTION PANEL
ALT: 20 IU/L (ref 0–44)
AST: 20 IU/L (ref 0–40)
Albumin: 4.4 g/dL (ref 3.8–4.8)
Alkaline Phosphatase: 94 IU/L (ref 44–121)
Bilirubin Total: 0.3 mg/dL (ref 0.0–1.2)
Bilirubin, Direct: 0.19 mg/dL (ref 0.00–0.40)
Total Protein: 7.1 g/dL (ref 6.0–8.5)

## 2020-12-31 ENCOUNTER — Other Ambulatory Visit: Payer: Self-pay

## 2021-01-03 ENCOUNTER — Other Ambulatory Visit: Payer: Self-pay

## 2021-01-20 ENCOUNTER — Ambulatory Visit (INDEPENDENT_AMBULATORY_CARE_PROVIDER_SITE_OTHER): Payer: Medicaid Other | Admitting: Primary Care

## 2021-01-28 ENCOUNTER — Other Ambulatory Visit: Payer: Self-pay

## 2021-01-31 ENCOUNTER — Other Ambulatory Visit: Payer: Self-pay

## 2021-02-03 ENCOUNTER — Ambulatory Visit (INDEPENDENT_AMBULATORY_CARE_PROVIDER_SITE_OTHER): Payer: Medicare Other | Admitting: Primary Care

## 2021-02-03 ENCOUNTER — Other Ambulatory Visit: Payer: Self-pay

## 2021-02-03 VITALS — BP 119/78 | HR 51 | Temp 97.5°F | Ht 71.0 in | Wt 285.6 lb

## 2021-02-03 DIAGNOSIS — Z794 Long term (current) use of insulin: Secondary | ICD-10-CM

## 2021-02-03 DIAGNOSIS — E119 Type 2 diabetes mellitus without complications: Secondary | ICD-10-CM

## 2021-02-03 DIAGNOSIS — Z6839 Body mass index (BMI) 39.0-39.9, adult: Secondary | ICD-10-CM | POA: Diagnosis not present

## 2021-02-03 LAB — POCT GLYCOSYLATED HEMOGLOBIN (HGB A1C): Hemoglobin A1C: 6.4 % — AB (ref 4.0–5.6)

## 2021-02-03 NOTE — Progress Notes (Signed)
[place] is a 60 y.o. male who presents for an follow up evaluation of Type 2 diabetes mellitus.  Current symptoms/problems include nausea and tired   and have been improving. Adjusted to new dose .Symptoms have been present for 1 month.  Current diabetic medications include metformin, Basaglar and [place]   The patient was initially diagnosed with Type 2 diabetes mellitus based on the following criteria:  ADA Guidelines .  Current monitoring regimen:  some times  Home blood sugar records:  nothing over 130  Any episodes of hypoglycemia? no  Known diabetic complications: peripheral vascular disease Cardiovascular risk factors: advanced age (older than 38 for men, 33 for women), diabetes mellitus, dyslipidemia, male gender, and obesity (BMI >= 30 kg/m2) Eye exam current (within one year): yes Weight trend: stable Prior visit with CDE: yes Current diet: in general, a "healthy" diet   Current exercise: walking Medication Compliance?  Yes   Is He on ACE inhibitor or angiotensin II receptor blocker?  Yes  lisinopril (generic)  Review of Systems  Eyes:        Vision changes last eye exam with in 6 mnths   Gastrointestinal:  Positive for nausea.  All other systems reviewed and are negative.  Objective:    BP 119/78 (BP Location: Right Arm, Patient Position: Sitting, Cuff Size: Large)   Pulse (!) 51   Temp (!) 97.5 F (36.4 C) (Temporal)   Ht 5\' 11"  (1.803 m)   Wt 285 lb 9.6 oz (129.5 kg)   SpO2 95%   BMI 39.83 kg/m   Physical Exam Vitals reviewed.  Constitutional:      Appearance: He is obese.  HENT:     Head: Normocephalic.     Right Ear: External ear normal.     Left Ear: Tympanic membrane and external ear normal.     Nose: Nose normal.  Eyes:     Extraocular Movements: Extraocular movements intact.     Pupils: Pupils are equal, round, and reactive to light.  Cardiovascular:     Rate and Rhythm: Normal rate and regular  rhythm.  Pulmonary:     Effort: Pulmonary effort is normal.     Breath sounds: Normal breath sounds.  Abdominal:     General: Bowel sounds are normal. There is distension.     Palpations: Abdomen is soft.  Musculoskeletal:        General: Normal range of motion.  Skin:    General: Skin is warm and dry.  Neurological:     Mental Status: He is alert and oriented to person, place, and time.  Psychiatric:        Mood and Affect: Mood normal.        Behavior: Behavior normal.        Thought Content: Thought content normal.        Judgment: Judgment normal.     Lab Review Glucose (mg/dL)  Date Value  04/14/2020 155 (H)  01/13/2020 191 (H)  05/08/2018 123 (H)   Glucose, Bld (mg/dL)  Date Value  08/26/2019 321 (H)  08/25/2019 390 (H)  04/16/2018 95   CO2 (mmol/L)  Date Value  04/14/2020 24  01/13/2020 23  08/26/2019 26   BUN (mg/dL)  Date Value  04/14/2020 12  01/13/2020 13  08/26/2019 8  08/25/2019 11  05/08/2018 10   Creat (mg/dL)  Date Value  08/17/2015 0.92  04/15/2015 0.96   Creatinine, Ser (mg/dL)  Date Value  04/14/2020 1.01  01/13/2020 1.12  08/26/2019 0.86      Assessment:    Diabetes Mellitus type II, under excellent control.   Spencer Robles was seen today for diabetes.  Diagnoses and all orders for this visit:  Type 2 diabetes mellitus without complication, with long-term current use of insulin (HCC) -     HgB A1c   Plan:    1.  Rx changes: reduce metformin and discontinue januvia 100mg  .  2.  Education: Reviewed 'ABCs' of diabetes management (respective goals in parentheses):  A1C (<7), blood pressure (<130/80), and cholesterol (LDL <100). Counseled Patient on the risk factors of co- morbidities with uncontrol diabetes  Complications -diabetic retinopathy, (close your eyes ? What do you see nothing) nephropathy decrease in kidney function- can lead to dialysis-on a machine 3 days a week to filter your kidney, neuropathy- numbness and tinging  in your hands and feet,  increase risk of heart attack and stroke, and amputation due to decrease wound healing and circulation. Decrease your risk by taking medication daily as prescribed, monitor carbohydrates- foods that are high in carbohydrates are the following rice, potatoes, breads, sugars, and pastas.  Reduction in the intake (eating) will assist in lowering your blood sugars. Exercise daily at least 30 minutes daily.   3. CHO counting diet discussed.  Reviewed CHO amount in various foods and how to read nutrition labels.  Discussed recommended serving sizes.   4.  Recommend check BG 0  times a day  5. Recommended increase physical activity - goal is 150 minutes per week   Comprehensive diabetic foot examination, type 2 DM, encounter for [facility]) Completed    Class 2 severe obesity due to excess calories with serious comorbidity and body mass index (BMI) of 39.0 to 39.9 in adult [facility]) Obesity is 30-39 indicating an excess in caloric intake or underlining conditions. This may lead to other co-morbidities. Lifestyle modifications of diet and exercise may reduce obesity.    This note has been created with Surveyor, quantity. Any transcriptional errors are unintentional.   Kerin Perna, NP 02/03/2021, 11:41 AM

## 2021-02-03 NOTE — Patient Instructions (Addendum)
Calorie Counting for Weight Loss Calories are units of energy. Your body needs a certain number of calories from food to keep going throughout the day. When you eat or drink more calories than your body needs, your body stores the extra calories mostly as fat. When you eat or drink fewer calories than your body needs, your body burns fat to getthe energy it needs. Calorie counting means keeping track of how many calories you eat and drink each day. Calorie counting can be helpful if you need to lose weight. If you eat fewer calories than your body needs, you should lose weight. Ask yourhealth care provider what a healthy weight is for you. For calorie counting to work, you will need to eat the right number of calories each day to lose a healthy amount of weight per week. A dietitian can help you figure out how many calories you need in a day and will suggest ways to reach your calorie goal. A healthy amount of weight to lose each week is usually 1-2 lb (0.5-0.9 kg). This usually means that your daily calorie intake should be reduced by 500-750 calories. Eating 1,200-1,500 calories a day can help most women lose weight. Eating 1,500-1,800 calories a day can help most men lose weight. What do I need to know about calorie counting? Work with your health care provider or dietitian to determine how many calories you should get each day. To meet your daily calorie goal, you will need to: Find out how many calories are in each food that you would like to eat. Try to do this before you eat. Decide how much of the food you plan to eat. Keep a food log. Do this by writing down what you ate and how many calories it had. To successfully lose weight, it is important to balance calorie counting with ahealthy lifestyle that includes regular activity. Where do I find calorie information?  The number of calories in a food can be found on a Nutrition Facts label. If a food does not have a Nutrition Facts label, try to  look up the calories onlineor ask your dietitian for help. Remember that calories are listed per serving. If you choose to have more than one serving of a food, you will have to multiply the calories per serving by the number of servings you plan to eat. For example, the label on a package of bread might say that a serving size is 1 slice and that there are 90 calories in a serving. If you eat 1 slice, you will have eaten 90 calories. If you eat 2slices, you will have eaten 180 calories. How do I keep a food log? After each time that you eat, record the following in your food log as soon as possible: What you ate. Be sure to include toppings, sauces, and other extras on the food. How much you ate. This can be measured in cups, ounces, or number of items. How many calories were in each food and drink. The total number of calories in the food you ate. Keep your food log near you, such as in a pocket-sized notebook or on an app or website on your mobile phone. Some programs will calculate calories for you andshow you how many calories you have left to meet your daily goal. What are some portion-control tips? Know how many calories are in a serving. This will help you know how many servings you can have of a certain food. Use a measuring cup to  measure serving sizes. You could also try weighing out portions on a kitchen scale. With time, you will be able to estimate serving sizes for some foods. Take time to put servings of different foods on your favorite plates or in your favorite bowls and cups so you know what a serving looks like. Try not to eat straight from a food's packaging, such as from a bag or box. Eating straight from the package makes it hard to see how much you are eating and can lead to overeating. Put the amount you would like to eat in a cup or on a plate to make sure you are eating the right portion. Use smaller plates, glasses, and bowls for smaller portions and to prevent  overeating. Try not to multitask. For example, avoid watching TV or using your computer while eating. If it is time to eat, sit down at a table and enjoy your food. This will help you recognize when you are full. It will also help you be more mindful of what and how much you are eating. What are tips for following this plan? Reading food labels Check the calorie count compared with the serving size. The serving size may be smaller than what you are used to eating. Check the source of the calories. Try to choose foods that are high in protein, fiber, and vitamins, and low in saturated fat, trans fat, and sodium. Shopping Read nutrition labels while you shop. This will help you make healthy decisions about which foods to buy. Pay attention to nutrition labels for low-fat or fat-free foods. These foods sometimes have the same number of calories or more calories than the full-fat versions. They also often have added sugar, starch, or salt to make up for flavor that was removed with the fat. Make a grocery list of lower-calorie foods and stick to it. Cooking Try to cook your favorite foods in a healthier way. For example, try baking instead of frying. Use low-fat dairy products. Meal planning Use more fruits and vegetables. One-half of your plate should be fruits and vegetables. Include lean proteins, such as chicken, [place], and fish. Lifestyle Each week, aim to do one of the following: 150 minutes of moderate exercise, such as walking. 75 minutes of vigorous exercise, such as running. General information Know how many calories are in the foods you eat most often. This will help you calculate calorie counts faster. Find a way of tracking calories that works for you. Get creative. Try different apps or programs if writing down calories does not work for you. What foods should I eat?  Eat nutritious foods. It is better to have a nutritious, high-calorie food, such as an avocado, than a food with  few nutrients, such as a bag of potato chips. Use your calories on foods and drinks that will fill you up and will not leave you hungry soon after eating. Examples of foods that fill you up are nuts and nut butters, vegetables, lean proteins, and high-fiber foods such as whole grains. High-fiber foods are foods with more than 5 g of fiber per serving. Pay attention to calories in drinks. Low-calorie drinks include water and unsweetened drinks. The items listed above may not be a complete list of foods and beverages you can eat. Contact a dietitian for more information. What foods should I limit? Limit foods or drinks that are not good sources of vitamins, minerals, or protein or that are high in unhealthy fats. These include: Candy. Other sweets. Sodas, specialty  coffee drinks, alcohol, and juice. The items listed above may not be a complete list of foods and beverages you should avoid. Contact a dietitian for more information. How do I count calories when eating out? Pay attention to portions. Often, portions are much larger when eating out. Try these tips to keep portions smaller: Consider sharing a meal instead of getting your own. If you get your own meal, eat only half of it. Before you start eating, ask for a container and put half of your meal into it. When available, consider ordering smaller portions from the menu instead of full portions. Pay attention to your food and drink choices. Knowing the way food is cooked and what is included with the meal can help you eat fewer calories. If calories are listed on the menu, choose the lower-calorie options. Choose dishes that include vegetables, fruits, whole grains, low-fat dairy products, and lean proteins. Choose items that are boiled, broiled, grilled, or steamed. Avoid items that are buttered, battered, fried, or served with cream sauce. Items labeled as crispy are usually fried, unless stated otherwise. Choose water, low-fat milk,  unsweetened iced tea, or other drinks without added sugar. If you want an alcoholic beverage, choose a lower-calorie option, such as a glass of wine or light beer. Ask for dressings, sauces, and syrups on the side. These are usually high in calories, so you should limit the amount you eat. If you want a salad, choose a garden salad and ask for grilled meats. Avoid extra toppings such as bacon, cheese, or fried items. Ask for the dressing on the side, or ask for olive oil and vinegar or lemon to use as dressing. Estimate how many servings of a food you are given. Knowing serving sizes will help you be aware of how much food you are eating at restaurants. Where to find more information Centers for Disease Control and Prevention: http://www.wolf.info/ U.[place]. Department of Agriculture: http://www.wilson-mendoza.org/ Summary Calorie counting means keeping track of how many calories you eat and drink each day. If you eat fewer calories than your body needs, you should lose weight. A healthy amount of weight to lose per week is usually 1-2 lb (0.5-0.9 kg). This usually means reducing your daily calorie intake by 500-750 calories. The number of calories in a food can be found on a Nutrition Facts label. If a food does not have a Nutrition Facts label, try to look up the calories online or ask your dietitian for help. Use smaller plates, glasses, and bowls for smaller portions and to prevent overeating. Use your calories on foods and drinks that will fill you up and not leave you hungry shortly after a meal. This information is not intended to replace advice given to you by your health care provider. Make sure you discuss any questions you have with your healthcare provider. Document Revised: 08/21/2019 Document Reviewed: 08/21/2019 Elsevier Patient Education  2022 [place] [employer] Calories are units of energy. Your body needs a certain number of calories from food to keep going throughout the day. When  you eat or drink more calories than your body needs, your body stores the extra calories mostly as fat. When you eat or drink fewer calories than your body needs, your body burns fat to getthe energy it needs. Calorie counting means keeping track of how many calories you eat and drink each day. Calorie counting can be helpful if you need to lose weight. If you eat fewer calories than your  body needs, you should lose weight. Ask yourhealth care provider what a healthy weight is for you. For calorie counting to work, you will need to eat the right number of calories each day to lose a healthy amount of weight per week. A dietitian can help you figure out how many calories you need in a day and will suggest ways to reach your calorie goal. A healthy amount of weight to lose each week is usually 1-2 lb (0.5-0.9 kg). This usually means that your daily calorie intake should be reduced by 500-750 calories. Eating 1,200-1,500 calories a day can help most women lose weight. Eating 1,500-1,800 calories a day can help most men lose weight. What do I need to know about calorie counting? Work with your health care provider or dietitian to determine how many calories you should get each day. To meet your daily calorie goal, you will need to: Find out how many calories are in each food that you would like to eat. Try to do this before you eat. Decide how much of the food you plan to eat. Keep a food log. Do this by writing down what you ate and how many calories it had. To successfully lose weight, it is important to balance calorie counting with ahealthy lifestyle that includes regular activity. Where do I find calorie information?  The number of calories in a food can be found on a Nutrition Facts label. If a food does not have a Nutrition Facts label, try to look up the calories onlineor ask your dietitian for help. Remember that calories are listed per serving. If you choose to have more than one serving of a  food, you will have to multiply the calories per serving by the number of servings you plan to eat. For example, the label on a package of bread might say that a serving size is 1 slice and that there are 90 calories in a serving. If you eat 1 slice, you will have eaten 90 calories. If you eat 2slices, you will have eaten 180 calories. How do I keep a food log? After each time that you eat, record the following in your food log as soon as possible: What you ate. Be sure to include toppings, sauces, and other extras on the food. How much you ate. This can be measured in cups, ounces, or number of items. How many calories were in each food and drink. The total number of calories in the food you ate. Keep your food log near you, such as in a pocket-sized notebook or on an app or website on your mobile phone. Some programs will calculate calories for you andshow you how many calories you have left to meet your daily goal. What are some portion-control tips? Know how many calories are in a serving. This will help you know how many servings you can have of a certain food. Use a measuring cup to measure serving sizes. You could also try weighing out portions on a kitchen scale. With time, you will be able to estimate serving sizes for some foods. Take time to put servings of different foods on your favorite plates or in your favorite bowls and cups so you know what a serving looks like. Try not to eat straight from a food's packaging, such as from a bag or box. Eating straight from the package makes it hard to see how much you are eating and can lead to overeating. Put the amount you would like to eat in  a cup or on a plate to make sure you are eating the right portion. Use smaller plates, glasses, and bowls for smaller portions and to prevent overeating. Try not to multitask. For example, avoid watching TV or using your computer while eating. If it is time to eat, sit down at a table and enjoy your food.  This will help you recognize when you are full. It will also help you be more mindful of what and how much you are eating. What are tips for following this plan? Reading food labels Check the calorie count compared with the serving size. The serving size may be smaller than what you are used to eating. Check the source of the calories. Try to choose foods that are high in protein, fiber, and vitamins, and low in saturated fat, trans fat, and sodium. Shopping Read nutrition labels while you shop. This will help you make healthy decisions about which foods to buy. Pay attention to nutrition labels for low-fat or fat-free foods. These foods sometimes have the same number of calories or more calories than the full-fat versions. They also often have added sugar, starch, or salt to make up for flavor that was removed with the fat. Make a grocery list of lower-calorie foods and stick to it. Cooking Try to cook your favorite foods in a healthier way. For example, try baking instead of frying. Use low-fat dairy products. Meal planning Use more fruits and vegetables. One-half of your plate should be fruits and vegetables. Include lean proteins, such as chicken, [place], and fish. Lifestyle Each week, aim to do one of the following: 150 minutes of moderate exercise, such as walking. 75 minutes of vigorous exercise, such as running. General information Know how many calories are in the foods you eat most often. This will help you calculate calorie counts faster. Find a way of tracking calories that works for you. Get creative. Try different apps or programs if writing down calories does not work for you. What foods should I eat?  Eat nutritious foods. It is better to have a nutritious, high-calorie food, such as an avocado, than a food with few nutrients, such as a bag of potato chips. Use your calories on foods and drinks that will fill you up and will not leave you hungry soon after eating. Examples  of foods that fill you up are nuts and nut butters, vegetables, lean proteins, and high-fiber foods such as whole grains. High-fiber foods are foods with more than 5 g of fiber per serving. Pay attention to calories in drinks. Low-calorie drinks include water and unsweetened drinks. The items listed above may not be a complete list of foods and beverages you can eat. Contact a dietitian for more information. What foods should I limit? Limit foods or drinks that are not good sources of vitamins, minerals, or protein or that are high in unhealthy fats. These include: Candy. Other sweets. Sodas, specialty coffee drinks, alcohol, and juice. The items listed above may not be a complete list of foods and beverages you should avoid. Contact a dietitian for more information. How do I count calories when eating out? Pay attention to portions. Often, portions are much larger when eating out. Try these tips to keep portions smaller: Consider sharing a meal instead of getting your own. If you get your own meal, eat only half of it. Before you start eating, ask for a container and put half of your meal into it. When available, consider ordering smaller portions from the  menu instead of full portions. Pay attention to your food and drink choices. Knowing the way food is cooked and what is included with the meal can help you eat fewer calories. If calories are listed on the menu, choose the lower-calorie options. Choose dishes that include vegetables, fruits, whole grains, low-fat dairy products, and lean proteins. Choose items that are boiled, broiled, grilled, or steamed. Avoid items that are buttered, battered, fried, or served with cream sauce. Items labeled as crispy are usually fried, unless stated otherwise. Choose water, low-fat milk, unsweetened iced tea, or other drinks without added sugar. If you want an alcoholic beverage, choose a lower-calorie option, such as a glass of wine or light beer. Ask for  dressings, sauces, and syrups on the side. These are usually high in calories, so you should limit the amount you eat. If you want a salad, choose a garden salad and ask for grilled meats. Avoid extra toppings such as bacon, cheese, or fried items. Ask for the dressing on the side, or ask for olive oil and vinegar or lemon to use as dressing. Estimate how many servings of a food you are given. Knowing serving sizes will help you be aware of how much food you are eating at restaurants. Where to find more information Centers for Disease Control and Prevention: http://www.wolf.info/ U.S. Department of Agriculture: http://www.wilson-mendoza.org/ Summary Calorie counting means keeping track of how many calories you eat and drink each day. If you eat fewer calories than your body needs, you should lose weight. A healthy amount of weight to lose per week is usually 1-2 lb (0.5-0.9 kg). This usually means reducing your daily calorie intake by 500-750 calories. The number of calories in a food can be found on a Nutrition Facts label. If a food does not have a Nutrition Facts label, try to look up the calories online or ask your dietitian for help. Use smaller plates, glasses, and bowls for smaller portions and to prevent overeating. Use your calories on foods and drinks that will fill you up and not leave you hungry shortly after a meal. This information is not intended to replace advice given to you by your health care provider. Make sure you discuss any questions you have with your healthcare provider. Document Revised: 08/21/2019 Document Reviewed: 08/21/2019 Elsevier Patient Education  2022 [employer].

## 2021-02-23 ENCOUNTER — Other Ambulatory Visit: Payer: Self-pay

## 2021-02-23 MED FILL — Metformin HCl Tab 500 MG: ORAL | 90 days supply | Qty: 180 | Fill #0 | Status: AC

## 2021-03-03 ENCOUNTER — Other Ambulatory Visit: Payer: Self-pay

## 2021-03-04 ENCOUNTER — Other Ambulatory Visit: Payer: Self-pay

## 2021-03-31 ENCOUNTER — Other Ambulatory Visit: Payer: Self-pay | Admitting: Family Medicine

## 2021-03-31 ENCOUNTER — Other Ambulatory Visit: Payer: Self-pay

## 2021-04-01 ENCOUNTER — Other Ambulatory Visit: Payer: Self-pay | Admitting: Pharmacist

## 2021-04-01 ENCOUNTER — Other Ambulatory Visit: Payer: Self-pay

## 2021-04-01 MED ORDER — TRULICITY 1.5 MG/0.5ML ~~LOC~~ SOAJ
1.5000 mg | SUBCUTANEOUS | 2 refills | Status: DC
  Filled 2021-04-01: qty 2, 28d supply, fill #0
  Filled 2021-05-02: qty 2, 28d supply, fill #1
  Filled 2021-06-29: qty 2, 28d supply, fill #2

## 2021-04-01 NOTE — Telephone Encounter (Signed)
Requested medication (s) are due for refill today: yes  Requested medication (s) are on the active medication list: yes  Last refill:  12/27/20 #2 ml 2 refills  Future visit scheduled: no  Notes to clinic:  valid encounter within 6 months . [id number]     Requested Prescriptions  Pending Prescriptions Disp Refills   Dulaglutide (TRULICITY) 1.5 0000000 SOPN 2 mL 2    Sig: Inject 1.5 mg into the skin once a week.     Endocrinology:  Diabetes - GLP-1 Receptor Agonists Passed - 03/31/2021  1:10 PM      Passed - HBA1C is between 0 and 7.9 and within 180 days    Hemoglobin A1C  Date Value Ref Range Status  02/03/2021 6.4 (A) 4.0 - 5.6 % Final   HbA1c, POC (controlled diabetic range)  Date Value Ref Range Status  10/20/2020 9.7 (A) 0.0 - 7.0 % Final          Passed - Valid encounter within last 6 months    Recent Outpatient Visits           1 month ago Type 2 diabetes mellitus without complication, with long-term current use of insulin ([place])   [place] [facility] Juluis Mire P, NP   3 months ago Type 2 diabetes mellitus without complication, with long-term current use of insulin ([place])   [place], Annie Main L, RPH-CPP   4 months ago Type 2 diabetes mellitus without complication, with long-term current use of insulin [facility])   [place], Annie Main L, RPH-CPP   5 months ago Type 2 diabetes mellitus without complication, with long-term current use of insulin [facility])   [place], Annie Main L, RPH-CPP   5 months ago Type 2 diabetes mellitus without complication, with long-term current use of insulin ([place])   [place] Kerin Perna, NP

## 2021-04-06 ENCOUNTER — Other Ambulatory Visit: Payer: Self-pay

## 2021-05-02 ENCOUNTER — Other Ambulatory Visit: Payer: Self-pay

## 2021-05-02 MED FILL — Lisinopril Tab 20 MG: ORAL | 90 days supply | Qty: 90 | Fill #1 | Status: AC

## 2021-05-05 ENCOUNTER — Other Ambulatory Visit: Payer: Self-pay

## 2021-05-05 MED FILL — Insulin Glargine Soln Pen-Injector 100 Unit/ML: SUBCUTANEOUS | 75 days supply | Qty: 15 | Fill #1 | Status: AC

## 2021-06-09 ENCOUNTER — Encounter (HOSPITAL_COMMUNITY): Payer: Self-pay | Admitting: Emergency Medicine

## 2021-06-09 ENCOUNTER — Emergency Department (HOSPITAL_COMMUNITY)
Admission: EM | Admit: 2021-06-09 | Discharge: 2021-06-09 | Disposition: A | Discharge status: Home / Self Care | Payer: Medicare Other | Attending: Physician Assistant | Admitting: Physician Assistant

## 2021-06-09 DIAGNOSIS — S60512A Abrasion of left hand, initial encounter: Secondary | ICD-10-CM | POA: Diagnosis not present

## 2021-06-09 DIAGNOSIS — S40022A Contusion of left upper arm, initial encounter: Secondary | ICD-10-CM | POA: Insufficient documentation

## 2021-06-09 DIAGNOSIS — Z7982 Long term (current) use of aspirin: Secondary | ICD-10-CM | POA: Diagnosis not present

## 2021-06-09 DIAGNOSIS — E119 Type 2 diabetes mellitus without complications: Secondary | ICD-10-CM | POA: Diagnosis not present

## 2021-06-09 DIAGNOSIS — S4992XA Unspecified injury of left shoulder and upper arm, initial encounter: Secondary | ICD-10-CM | POA: Diagnosis present

## 2021-06-09 DIAGNOSIS — Y92410 Unspecified street and highway as the place of occurrence of the external cause: Secondary | ICD-10-CM | POA: Insufficient documentation

## 2021-06-09 DIAGNOSIS — Z23 Encounter for immunization: Secondary | ICD-10-CM | POA: Diagnosis not present

## 2021-06-09 DIAGNOSIS — M79602 Pain in left arm: Secondary | ICD-10-CM

## 2021-06-09 DIAGNOSIS — Z794 Long term (current) use of insulin: Secondary | ICD-10-CM | POA: Insufficient documentation

## 2021-06-09 DIAGNOSIS — Z7984 Long term (current) use of oral hypoglycemic drugs: Secondary | ICD-10-CM | POA: Diagnosis not present

## 2021-06-09 DIAGNOSIS — Y9301 Activity, walking, marching and hiking: Secondary | ICD-10-CM | POA: Diagnosis not present

## 2021-06-09 MED ORDER — TETANUS-DIPHTH-ACELL PERTUSSIS 5-2.5-18.5 LF-MCG/0.5 IM SUSY
0.5000 mL | PREFILLED_SYRINGE | Freq: Once | INTRAMUSCULAR | Status: AC
  Administered 2021-06-09: 13:00:00 0.5 mL via INTRAMUSCULAR
  Filled 2021-06-09: qty 0.5
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
## 2021-06-09 NOTE — Discharge Instructions (Signed)
Take tylenol and motrin for pain   Keep the wound clean and dry   Please follow up with your primary care provider within 5-7 days for re-evaluation of your symptoms. If you do not have a primary care provider, information for a healthcare clinic has been provided for you to make arrangements for follow up care. Please return to the emergency department for any new or worsening symptoms.

## 2021-06-29 ENCOUNTER — Other Ambulatory Visit: Payer: Self-pay

## 2021-06-29 ENCOUNTER — Other Ambulatory Visit (INDEPENDENT_AMBULATORY_CARE_PROVIDER_SITE_OTHER): Payer: Self-pay | Admitting: Primary Care

## 2021-06-29 DIAGNOSIS — Z794 Long term (current) use of insulin: Secondary | ICD-10-CM

## 2021-06-29 DIAGNOSIS — E119 Type 2 diabetes mellitus without complications: Secondary | ICD-10-CM

## 2021-06-30 ENCOUNTER — Other Ambulatory Visit: Payer: Self-pay

## 2021-07-12 ENCOUNTER — Other Ambulatory Visit: Payer: Self-pay

## 2021-07-13 ENCOUNTER — Ambulatory Visit (INDEPENDENT_AMBULATORY_CARE_PROVIDER_SITE_OTHER): Payer: Medicare Other | Admitting: Primary Care

## 2021-07-13 ENCOUNTER — Other Ambulatory Visit: Payer: Self-pay

## 2021-07-13 ENCOUNTER — Encounter (INDEPENDENT_AMBULATORY_CARE_PROVIDER_SITE_OTHER): Payer: Self-pay | Admitting: Primary Care

## 2021-07-13 ENCOUNTER — Telehealth (INDEPENDENT_AMBULATORY_CARE_PROVIDER_SITE_OTHER): Payer: Self-pay

## 2021-07-13 VITALS — BP 138/83 | HR 54 | Temp 97.5°F | Ht 71.0 in | Wt 284.6 lb

## 2021-07-13 DIAGNOSIS — E119 Type 2 diabetes mellitus without complications: Secondary | ICD-10-CM | POA: Diagnosis not present

## 2021-07-13 DIAGNOSIS — R202 Paresthesia of skin: Secondary | ICD-10-CM | POA: Diagnosis not present

## 2021-07-13 DIAGNOSIS — M79602 Pain in left arm: Secondary | ICD-10-CM

## 2021-07-13 DIAGNOSIS — E782 Mixed hyperlipidemia: Secondary | ICD-10-CM

## 2021-07-13 DIAGNOSIS — I10 Essential (primary) hypertension: Secondary | ICD-10-CM

## 2021-07-13 DIAGNOSIS — Z794 Long term (current) use of insulin: Secondary | ICD-10-CM

## 2021-07-13 DIAGNOSIS — M79601 Pain in right arm: Secondary | ICD-10-CM

## 2021-07-13 DIAGNOSIS — Z760 Encounter for issue of repeat prescription: Secondary | ICD-10-CM

## 2021-07-13 DIAGNOSIS — Z1211 Encounter for screening for malignant neoplasm of colon: Secondary | ICD-10-CM

## 2021-07-13 LAB — POCT GLYCOSYLATED HEMOGLOBIN (HGB A1C): Hemoglobin A1C: 6.6 % — AB (ref 4.0–5.6)

## 2021-07-13 MED ORDER — ATORVASTATIN CALCIUM 20 MG PO TABS
20.0000 mg | ORAL_TABLET | Freq: Every day | ORAL | 1 refills | Status: DC
  Filled 2021-07-13: qty 90, 90d supply, fill #0

## 2021-07-13 MED ORDER — TRULICITY 1.5 MG/0.5ML ~~LOC~~ SOAJ
1.5000 mg | SUBCUTANEOUS | 2 refills | Status: DC
  Filled 2021-07-13 – 2021-07-30 (×2): qty 2, 28d supply, fill #0
  Filled 2021-10-03: qty 2, 28d supply, fill #1
  Filled 2021-11-04: qty 2, 28d supply, fill #2

## 2021-07-13 MED ORDER — GABAPENTIN 300 MG PO CAPS
ORAL_CAPSULE | Freq: Three times a day (TID) | ORAL | 5 refills | Status: DC
Start: 2021-07-13 — End: 2022-01-11
  Filled 2021-07-13: qty 180, 30d supply, fill #0

## 2021-07-13 MED ORDER — METFORMIN HCL ER 500 MG PO TB24
500.0000 mg | ORAL_TABLET | Freq: Every day | ORAL | 1 refills | Status: DC
  Filled 2021-07-13 – 2021-12-16 (×3): qty 90, 90d supply, fill #0

## 2021-07-13 MED ORDER — LISINOPRIL 20 MG PO TABS
ORAL_TABLET | Freq: Every day | ORAL | 1 refills | Status: DC
  Filled 2021-07-13 (×2): qty 90, 90d supply, fill #0

## 2021-07-13 MED ORDER — BASAGLAR KWIKPEN 100 UNIT/ML ~~LOC~~ SOPN
20.0000 [IU] | PEN_INJECTOR | Freq: Every day | SUBCUTANEOUS | 2 refills | Status: DC
  Filled 2021-07-13 – 2022-05-17 (×4): qty 15, 75d supply, fill #0

## 2021-07-13 NOTE — Progress Notes (Signed)
Subjective:  Patient ID: Spencer Robles, male    DOB: 09/03/1960  Age: 60 y.o. MRN: [id number]  CC: Diabetes   HPI Spencer Robles presents for follow-up of diabetes. Patient does check blood sugar at home. Management of HTN-Denies shortness of breath, headaches, chest pain or lower extremity edema   Compliant with meds - Yes Checking CBGs? Yes  Fasting avg - 100-120   Postprandial average -  Exercising regularly? - Yes Watching carbohydrate intake? - Yes Neuropathy ? - Yes Hypoglycemic events - No  - Recovers with :   Pertinent ROS:  Polyuria - No Polydipsia - No Vision problems - Yes  Medications as noted below. Taking them regularly without complication/adverse reaction being reported today.   History Spencer Robles has a past medical history of Diabetes mellitus without complication ([place]), Epididymitis (08/15/2017), necrotising fasciitis (06/2017), Scrotal swelling, and Sleep apnea.   He has a past surgical history that includes Circumcision; Amputation toe (Right, 07/06/2017); Mass biopsy (~ 2008); Scrotal exploration (N/A, 08/21/2017); and Scrotal exploration (N/A, 08/30/2017).   His family history is not on file.He reports that he has never smoked. He has never used smokeless tobacco. He reports that he does not drink alcohol and does not use drugs.  Current Outpatient Medications on File Prior to Visit  Medication Sig Dispense Refill   aspirin EC 81 MG tablet Take 1 tablet (81 mg total) by mouth daily. 90 tablet 0   Blood Glucose Monitoring Suppl (TRUE METRIX METER) w/Device KIT Use to check blood sugar up to 3 times daily. 1 kit 0   glucose blood (TRUE METRIX BLOOD GLUCOSE TEST) test strip Use as instructed to check blood sugar up to 3 times daily. 100 each 11   ibuprofen (ADVIL) 800 MG tablet Take 800 mg by mouth every 6 (six) hours as needed for mild pain.      Insulin Pen Needle (TRUEPLUS PEN NEEDLES) 31G X 5 MM MISC 31.5 mm by Does not apply route 3 times/day as  needed-between meals & bedtime. 100 each 11   TRUEplus Lancets 28G MISC Use as instructed to check blood sugar up to 3 times daily. 100 each 11   [DISCONTINUED] sitaGLIPtin (JANUVIA) 100 MG tablet Take 1 tablet (100 mg total) by mouth daily. 90 tablet 1   No current facility-administered medications on file prior to visit.    ROS Comprehensive ROS pertinent positive and negative noted in HPI  Objective:  BP 138/83 (BP Location: Right Arm, Patient Position: Sitting, Cuff Size: Large)    Pulse (!) 54    Temp (!) 97.5 F (36.4 C) (Temporal)    Ht _0  (1.803 m)    Wt 284 lb 9.6 oz (129.1 kg)    SpO2 99%    BMI 39.69 kg/m   BP Readings from Last 3 Encounters:  07/13/21 138/83  06/09/21 137/84  02/03/21 119/78    Wt Readings from Last 3 Encounters:  07/13/21 284 lb 9.6 oz (129.1 kg)  02/03/21 285 lb 9.6 oz (129.5 kg)  10/20/20 278 lb 9.6 oz (126.4 kg)   Physical exam: General: Vital signs reviewed.  Patient is well-developed and well-nourished, obese male in no acute distress and cooperative with exam. Head: Normocephalic and atraumatic. Eyes: EOMI, conjunctivae normal, no scleral icterus. Neck: Supple, trachea midline, normal ROM, no JVD, masses, thyromegaly, or carotid bruit present. Cardiovascular: RRR, S1 normal, S2 normal, no murmurs, gallops, or rubs. Pulmonary/Chest: Clear to auscultation bilaterally, no wheezes, rales, or rhonchi. Abdominal: Soft, non-tender, non-distended, BS +,  no masses, organomegaly, or guarding present. Musculoskeletal: No joint deformities, erythema, or stiffness, ROM full and nontender. Extremities: No lower extremity edema bilaterally,  pulses symmetric and intact bilaterally. No cyanosis or clubbing. Neurological: A&O x3, Strength is normal Skin: Warm, dry and intact. No rashes or erythema. Psychiatric: Normal mood and affect. speech and behavior is normal. Cognition and memory are normal.    Lab Results  Component Value Date   HGBA1C 6.6 (A)  07/13/2021   HGBA1C 6.4 (A) 02/03/2021   HGBA1C 9.7 (A) 10/20/2020    Lab Results  Component Value Date   WBC 8.5 04/14/2020   HGB 14.3 04/14/2020   HCT 40.5 04/14/2020   PLT 127 (L) 04/14/2020   GLUCOSE 112 (H) 07/13/2021   CHOL 157 07/13/2021   TRIG 85 07/13/2021   HDL 42 07/13/2021   LDLCALC 99 07/13/2021   ALT 19 07/13/2021   AST 24 07/13/2021   NA 143 07/13/2021   K 4.4 07/13/2021   CL 104 07/13/2021   CREATININE 1.06 07/13/2021   BUN 13 07/13/2021   CO2 25 07/13/2021   HGBA1C 6.6 (A) 07/13/2021     Assessment & Plan:   Spencer was seen today for diabetes.  Diagnoses and all orders for this visit:  Type 2 diabetes mellitus without complication, with long-term current use of insulin ([place]) He is well controlled . Continue to monitor foods that are high in carbohydrates are the following rice, potatoes, breads, sugars, and pastas.  Reduction in the intake (eating) will assist in lowering your blood sugars.  -     HgB A1c 6.6   -     Lipid Panel -     CMP14+EGFR -     gabapentin (NEURONTIN) 300 MG capsule; TAKE 2 CAPSULES (600 MG TOTAL) BY MOUTH 3 (THREE) TIMES DAILY. -     lisinopril (ZESTRIL) 20 MG tablet; TAKE 1 TABLET (20 MG TOTAL) BY MOUTH DAILY.  Essential hypertension Counseled on blood pressure goal of less than 130/80, low-sodium, DASH diet, medication compliance, 150 minutes of moderate intensity exercise per week. Discussed medication compliance, adverse effects.  -     lisinopril (ZESTRIL) 20 MG tablet; TAKE 1 TABLET (20 MG TOTAL) BY MOUTH DAILY.  Colon cancer screening Referral made appt to be schedule   Mixed hyperlipidemia  Healthy lifestyle diet of fruits vegetables fish nuts whole grains and low saturated fat . Foods high in cholesterol or liver, fatty meats,cheese, butter avocados, nuts and seeds, chocolate and fried foods. -     atorvastatin (LIPITOR) 20 MG tablet; Take 1 tablet (20 mg total) by mouth daily.  Paresthesia and pain of both  upper extremities -     gabapentin (NEURONTIN) 300 MG capsule; TAKE 2 CAPSULES (600 MG TOTAL) BY MOUTH 3 (THREE) TIMES DAILY.  Paresthesia of both lower extremities -     gabapentin (NEURONTIN) 300 MG capsule; TAKE 2 CAPSULES (600 MG TOTAL) BY MOUTH 3 (THREE) TIMES DAILY.  Medication refill -     Dulaglutide (TRULICITY) 1.5 BM/8.4XL SOPN; Inject 1.5 mg into the skin once a week. -     gabapentin (NEURONTIN) 300 MG capsule; TAKE 2 CAPSULES (600 MG TOTAL) BY MOUTH 3 (THREE) TIMES DAILY. -     Insulin Glargine (BASAGLAR KWIKPEN) 100 UNIT/ML; INJECT 20 UNITS INTO THE SKIN DAILY. -     lisinopril (ZESTRIL) 20 MG tablet; TAKE 1 TABLET (20 MG TOTAL) BY MOUTH DAILY. -     metFORMIN (GLUCOPHAGE XR) 500 MG 24 hr tablet;  Take 1 tablet (500 mg total) by mouth daily with breakfast.   I have discontinued Marland Kitchen Robles's metFORMIN. I am also having him start on metFORMIN. Additionally, I am having him maintain his ibuprofen, aspirin EC, True Metrix Meter, True Metrix Blood Glucose Test, TRUEplus Lancets 28G, TRUEplus Pen Needles, atorvastatin, Trulicity, gabapentin, Basaglar KwikPen, and lisinopril.  Meds ordered this encounter  Medications   atorvastatin (LIPITOR) 20 MG tablet    Sig: Take 1 tablet (20 mg total) by mouth daily.    Dispense:  90 tablet    Refill:  1   Dulaglutide (TRULICITY) 1.5 ET/0.1FT SOPN    Sig: Inject 1.5 mg into the skin once a week.    Dispense:  2 mL    Refill:  2   gabapentin (NEURONTIN) 300 MG capsule    Sig: TAKE 2 CAPSULES (600 MG TOTAL) BY MOUTH 3 (THREE) TIMES DAILY.    Dispense:  180 capsule    Refill:  5   Insulin Glargine (BASAGLAR KWIKPEN) 100 UNIT/ML    Sig: INJECT 20 UNITS INTO THE SKIN DAILY.    Dispense:  15 mL    Refill:  2   lisinopril (ZESTRIL) 20 MG tablet    Sig: TAKE 1 TABLET (20 MG TOTAL) BY MOUTH DAILY.    Dispense:  90 tablet    Refill:  1   metFORMIN (GLUCOPHAGE XR) 500 MG 24 hr tablet    Sig: Take 1 tablet (500 mg total) by mouth daily with  breakfast.    Dispense:  90 tablet    Refill:  1     Follow-up:   Return in about 6 months (around 01/11/2022) for Bp/DM/fasting.  The above assessment and management plan was discussed with the patient. The patient verbalized understanding of and has agreed to the management plan. Patient is aware to call the clinic if symptoms fail to improve or worsen. Patient is aware when to return to the clinic for a follow-up visit. Patient educated on when it is appropriate to go to the emergency department.   Juluis Mire, NP-C

## 2021-07-13 NOTE — Patient Instructions (Signed)

## 2021-07-14 LAB — CMP14+EGFR
ALT: 19 IU/L (ref 0–44)
AST: 24 IU/L (ref 0–40)
Albumin/Globulin Ratio: 1.9 (ref 1.2–2.2)
Albumin: 4.9 g/dL — ABNORMAL HIGH (ref 3.8–4.8)
Alkaline Phosphatase: 112 IU/L (ref 44–121)
BUN/Creatinine Ratio: 12 (ref 10–24)
BUN: 13 mg/dL (ref 8–27)
Bilirubin Total: 0.4 mg/dL (ref 0.0–1.2)
CO2: 25 mmol/L (ref 20–29)
Calcium: 9.9 mg/dL (ref 8.6–10.2)
Chloride: 104 mmol/L (ref 96–106)
Creatinine, Ser: 1.06 mg/dL (ref 0.76–1.27)
Globulin, Total: 2.6 g/dL (ref 1.5–4.5)
Glucose: 112 mg/dL — ABNORMAL HIGH (ref 70–99)
Potassium: 4.4 mmol/L (ref 3.5–5.2)
Sodium: 143 mmol/L (ref 134–144)
Total Protein: 7.5 g/dL (ref 6.0–8.5)
eGFR: 79 mL/min/{1.73_m2} (ref >59)

## 2021-07-14 LAB — LIPID PANEL
Chol/HDL Ratio: 3.7 ratio (ref 0.0–5.0)
Cholesterol, Total: 157 mg/dL (ref 100–199)
HDL: 42 mg/dL (ref >39)
LDL Chol Calc (NIH): 99 mg/dL (ref 0–99)
Triglycerides: 85 mg/dL (ref 0–149)
VLDL Cholesterol Cal: 16 mg/dL (ref 5–40)

## 2021-07-14 NOTE — Telephone Encounter (Signed)
Open in error

## 2021-07-30 ENCOUNTER — Other Ambulatory Visit: Payer: Self-pay

## 2021-08-01 ENCOUNTER — Other Ambulatory Visit: Payer: Self-pay

## 2021-08-04 ENCOUNTER — Other Ambulatory Visit: Payer: Self-pay

## 2021-10-03 ENCOUNTER — Other Ambulatory Visit: Payer: Self-pay

## 2021-10-06 ENCOUNTER — Other Ambulatory Visit: Payer: Self-pay

## 2021-10-07 ENCOUNTER — Encounter (INDEPENDENT_AMBULATORY_CARE_PROVIDER_SITE_OTHER): Payer: Medicare Other

## 2021-10-07 NOTE — Patient Instructions (Signed)

## 2021-11-04 ENCOUNTER — Other Ambulatory Visit: Payer: Self-pay

## 2021-11-07 ENCOUNTER — Other Ambulatory Visit: Payer: Self-pay

## 2021-11-07 MED ORDER — PEG 3350-KCL-NA BICARB-NACL 420 G PO SOLR
ORAL | 0 refills | Status: DC
  Filled 2021-11-07: qty 4000, 1d supply, fill #0

## 2021-11-12 ENCOUNTER — Emergency Department (HOSPITAL_COMMUNITY)
Admission: EM | Admit: 2021-11-12 | Discharge: 2021-11-12 | Disposition: A | Discharge status: Home / Self Care | Payer: Medicare Other | Attending: Emergency Medicine | Admitting: Emergency Medicine

## 2021-11-12 ENCOUNTER — Emergency Department (HOSPITAL_COMMUNITY): Payer: Medicare Other

## 2021-11-12 ENCOUNTER — Other Ambulatory Visit: Payer: Self-pay

## 2021-11-12 ENCOUNTER — Encounter (HOSPITAL_COMMUNITY): Payer: Self-pay

## 2021-11-12 DIAGNOSIS — Z7982 Long term (current) use of aspirin: Secondary | ICD-10-CM | POA: Insufficient documentation

## 2021-11-12 DIAGNOSIS — M7989 Other specified soft tissue disorders: Secondary | ICD-10-CM | POA: Diagnosis not present

## 2021-11-12 DIAGNOSIS — Z7984 Long term (current) use of oral hypoglycemic drugs: Secondary | ICD-10-CM | POA: Diagnosis not present

## 2021-11-12 DIAGNOSIS — S90934A Unspecified superficial injury of right lesser toe(s), initial encounter: Secondary | ICD-10-CM | POA: Diagnosis present

## 2021-11-12 DIAGNOSIS — E119 Type 2 diabetes mellitus without complications: Secondary | ICD-10-CM | POA: Insufficient documentation

## 2021-11-12 DIAGNOSIS — Z794 Long term (current) use of insulin: Secondary | ICD-10-CM | POA: Diagnosis not present

## 2021-11-12 DIAGNOSIS — W268XXA Contact with other sharp object(s), not elsewhere classified, initial encounter: Secondary | ICD-10-CM | POA: Diagnosis not present

## 2021-11-12 DIAGNOSIS — S90414A Abrasion, right lesser toe(s), initial encounter: Secondary | ICD-10-CM | POA: Insufficient documentation

## 2021-11-12 LAB — URINALYSIS, ROUTINE W REFLEX MICROSCOPIC
Bacteria, UA: NONE SEEN
Bilirubin Urine: NEGATIVE
Glucose, UA: NEGATIVE mg/dL
Hgb urine dipstick: NEGATIVE
Ketones, ur: NEGATIVE mg/dL
Nitrite: NEGATIVE
Protein, ur: 30 mg/dL — AB
Specific Gravity, Urine: 1.018 (ref 1.005–1.030)
pH: 5 (ref 5.0–8.0)

## 2021-11-12 LAB — COMPREHENSIVE METABOLIC PANEL
ALT: 18 U/L (ref 0–44)
AST: 23 U/L (ref 15–41)
Albumin: 4.2 g/dL (ref 3.5–5.0)
Alkaline Phosphatase: 96 U/L (ref 38–126)
Anion gap: 9 (ref 5–15)
BUN: 9 mg/dL (ref 8–23)
CO2: 25 mmol/L (ref 22–32)
Calcium: 9.9 mg/dL (ref 8.9–10.3)
Chloride: 105 mmol/L (ref 98–111)
Creatinine, Ser: 1.1 mg/dL (ref 0.61–1.24)
GFR, Estimated: >60 mL/min (ref >60)
Glucose, Bld: 101 mg/dL — ABNORMAL HIGH (ref 70–99)
Potassium: 3.7 mmol/L (ref 3.5–5.1)
Sodium: 139 mmol/L (ref 135–145)
Total Bilirubin: 0.8 mg/dL (ref 0.3–1.2)
Total Protein: 7.7 g/dL (ref 6.5–8.1)

## 2021-11-12 LAB — CBC WITH DIFFERENTIAL/PLATELET
Abs Immature Granulocytes: 0.03 10*3/uL (ref 0.00–0.07)
Basophils Absolute: 0.1 10*3/uL (ref 0.0–0.1)
Basophils Relative: 1 %
Eosinophils Absolute: 0.3 10*3/uL (ref 0.0–0.5)
Eosinophils Relative: 3 %
HCT: 43.7 % (ref 39.0–52.0)
Hemoglobin: 14.4 g/dL (ref 13.0–17.0)
Immature Granulocytes: 0 %
Lymphocytes Relative: 19 %
Lymphs Abs: 1.9 10*3/uL (ref 0.7–4.0)
MCH: 27.7 pg (ref 26.0–34.0)
MCHC: 33 g/dL (ref 30.0–36.0)
MCV: 84.2 fL (ref 80.0–100.0)
Monocytes Absolute: 0.6 10*3/uL (ref 0.1–1.0)
Monocytes Relative: 6 %
Neutro Abs: 7 10*3/uL (ref 1.7–7.7)
Neutrophils Relative %: 71 %
Platelets: 164 10*3/uL (ref 150–400)
RBC: 5.19 MIL/uL (ref 4.22–5.81)
RDW: 13.1 % (ref 11.5–15.5)
WBC: 10 10*3/uL (ref 4.0–10.5)
nRBC: 0 % (ref 0.0–0.2)

## 2021-11-12 LAB — LACTIC ACID, PLASMA: Lactic Acid, Venous: 1.3 mmol/L (ref 0.5–1.9)

## 2021-11-12 MED ORDER — ENOXAPARIN SODIUM 150 MG/ML IJ SOSY
130.0000 mg | PREFILLED_SYRINGE | Freq: Once | INTRAMUSCULAR | Status: AC
  Administered 2021-11-12: 130 mg via SUBCUTANEOUS
  Filled 2021-11-12: qty 0.86

## 2021-11-12 NOTE — ED Provider Notes (Signed)
?[facility] ?Provider Note ? ? ?CSN: [id number] ?Arrival date & time: 11/12/21  1715 ? ?  ? ?History ? ?No chief complaint on file. ? ? ?Spencer Robles is a 61 y.o. male. ? ?HPI ?61 year old male with diabetes presents with right foot and leg swelling.  He states 2 weeks ago he was clipping the nails of his feet and on the right middle toe he accidentally developed a small cut and had bleeding.  Ever since then he has had a little bit of clear drainage whenever he tries to walk or get up.  Since a colonoscopy earlier in the week he has developed right leg swelling as well.  He wants to make sure he does not have a foot infection.  He denies fever, chest pain, shortness of breath.  He has diffuse pain and swelling in his foot and lower leg. ? ?Home Medications ?Prior to Admission medications   ?Medication Sig Start Date End Date Taking? Authorizing Provider  ?aspirin EC 81 MG tablet Take 1 tablet (81 mg total) by mouth daily. 08/28/19   Florencia Reasons, MD  ?atorvastatin (LIPITOR) 20 MG tablet Take 1 tablet (20 mg total) by mouth daily. 07/13/21   Kerin Perna, NP  ?Blood Glucose Monitoring Suppl (TRUE METRIX METER) w/Device KIT Use to check blood sugar up to 3 times daily. 08/28/19   Florencia Reasons, MD  ?Dulaglutide (TRULICITY) 1.5 BJ/4.7WG SOPN Inject 1.5 mg into the skin once a week. 07/13/21   Kerin Perna, NP  ?gabapentin (NEURONTIN) 300 MG capsule TAKE 2 CAPSULES (600 MG TOTAL) BY MOUTH 3 (THREE) TIMES DAILY. 07/13/21 07/13/22  Kerin Perna, NP  ?glucose blood (TRUE METRIX BLOOD GLUCOSE TEST) test strip Use as instructed to check blood sugar up to 3 times daily. 08/28/19   Florencia Reasons, MD  ?ibuprofen (ADVIL) 800 MG tablet Take 800 mg by mouth every 6 (six) hours as needed for mild pain.     [provider]  ?Insulin Glargine (BASAGLAR KWIKPEN) 100 UNIT/ML INJECT 20 UNITS INTO THE SKIN DAILY. 07/13/21 07/13/22  Kerin Perna, NP  ?Insulin Pen Needle (TRUEPLUS  PEN NEEDLES) 31G X 5 MM MISC 31.5 mm by Does not apply route 3 times/day as needed-between meals & bedtime. 01/13/20   Kerin Perna, NP  ?lisinopril (ZESTRIL) 20 MG tablet TAKE 1 TABLET (20 MG TOTAL) BY MOUTH DAILY. 07/13/21 07/13/22  Kerin Perna, NP  ?metFORMIN (GLUCOPHAGE XR) 500 MG 24 hr tablet Take 1 tablet (500 mg total) by mouth daily with breakfast. 07/13/21   Kerin Perna, NP  ?polyethylene glycol-electrolytes (NULYTELY) 420 g solution use as directed 08/10/21   Otis Brace, MD  ?TRUEplus Lancets 28G MISC Use as instructed to check blood sugar up to 3 times daily. 08/28/19   Florencia Reasons, MD  ?sitaGLIPtin (JANUVIA) 100 MG tablet Take 1 tablet (100 mg total) by mouth daily. 01/13/20 10/20/20  Kerin Perna, NP  ?   ? ?Allergies    ?Patient has no known allergies.   ? ?Review of Systems   ?Review of Systems  ?Constitutional:  Negative for fever.  ?Respiratory:  Negative for shortness of breath.   ?Cardiovascular:  Positive for leg swelling. Negative for chest pain.  ?Musculoskeletal:  Positive for arthralgias.  ?Skin:  Positive for wound.  ? ?Physical Exam ?Updated Vital Signs ?BP 122/79 (BP Location: Left Arm)   Pulse (!) 50   Temp 98.3 ?F (36.8 ?C) (Oral)   Resp 16  SpO2 100%  ?Physical Exam ?Vitals and nursing note reviewed.  ?Constitutional:   ?   Appearance: He is well-developed. He is obese.  ?HENT:  ?   Head: Normocephalic and atraumatic.  ?Cardiovascular:  ?   Rate and Rhythm: Normal rate and regular rhythm.  ?   Pulses:     ?     Dorsalis pedis pulses are 2+ on the right side.  ?Pulmonary:  ?   Effort: Pulmonary effort is normal.  ?Musculoskeletal:  ?   Right lower leg: Swelling present.  ?   Right foot: Swelling and tenderness present.  ?     Feet: ? ?Feet:  ?   Comments: Diffuse darkening to the distal one third-one fourth of the right foot.  He also has symmetric darkening to the skin on the left foot.  Otherwise he has diffuse swelling to the right foot with some  generalized tenderness as well as swelling to his right lower leg.  No cellulitis.  No increased warmth. ?Skin: ?   General: Skin is warm and dry.  ?Neurological:  ?   Mental Status: He is alert.  ? ? ?ED Results / Procedures / Treatments   ?Labs ?(all labs ordered are listed, but only abnormal results are displayed) ?Labs Reviewed  ?COMPREHENSIVE METABOLIC PANEL - Abnormal; Notable for the following components:  ?    Result Value  ? Glucose, Bld 101 (*)   ? All other components within normal limits  ?URINALYSIS, ROUTINE W REFLEX MICROSCOPIC - Abnormal; Notable for the following components:  ? APPearance HAZY (*)   ? Protein, ur 30 (*)   ? Leukocytes,Ua MODERATE (*)   ? All other components within normal limits  ?LACTIC ACID, PLASMA  ?CBC WITH DIFFERENTIAL/PLATELET  ? ? ?EKG ?None ? ?Radiology ?DG Foot Complete Right ? ?Result Date: 11/12/2021 ?CLINICAL DATA:  Infection, pain, swelling EXAM: RIGHT FOOT COMPLETE - 3+ VIEW COMPARISON:  08/25/2019 FINDINGS: Changes of prior 1st and 2nd toe amputations, unchanged. No acute bony abnormality. No bone destruction to suggest osteomyelitis. No fracture, subluxation or dislocation. No soft tissue gas. IMPRESSION: No acute bony abnormality. Electronically Signed   By: Rolm Baptise M.D.   On: 11/12/2021 18:55   ? ?Procedures ?Procedures  ? ? ?Medications Ordered in ED ?Medications  ?enoxaparin (LOVENOX) 100 mg/mL injection 1 mg/kg (has no administration in time range)  ? ? ?ED Course/ Medical Decision Making/ A&P ?  ?                        ?Medical Decision Making ?Amount and/or Complexity of Data Reviewed ?Labs: ordered. ?Radiology: ordered. ? ?Risk ?Prescription drug management. ? ? ?I personally viewed/interpreted the labs which show no leukocytosis or lactic acidosis.  X-ray images viewed by myself as well and there is no acute fracture or bony lesions.  I have low suspicion for osteomyelitis.  He has a very superficial wound to the plantar aspect of the toe and so I think  deeper infection is unlikely.  He does have a diffusely swollen leg and so I think getting a DVT ultrasound would be beneficial.  They are not available at this time so he will be given subcutaneous Lovenox and brought back tomorrow for the DVT ultrasound.  No signs/symptoms of PE. ? ? ? ? ? ? ? ? ?Final Clinical Impression(s) / ED Diagnoses ?Final diagnoses:  ?Right leg swelling  ? ? ?Rx / DC Orders ?ED Discharge Orders   ? ?  Ordered  ?  LE VENOUS       ? 11/12/21 2204  ? ?  ?  ? ?  ? ? ?  ?Sherwood Gambler, MD ?11/12/21 2209 ? ?

## 2021-11-12 NOTE — Discharge Instructions (Addendum)
If you develop fever, new or worsening swelling, chest pain, shortness of breath, redness to the skin, or any other new/concerning symptoms then return to the ER for evaluation. ? ?IMPORTANT PATIENT INSTRUCTIONS:  You have been scheduled for an Outpatient Vascular Study at [facility].   ? ?If tomorrow is a Saturday, Sunday or holiday, please go to the [facility] Emergency Department Registration Desk at 11 am tomorrow morning and tell them you are there for a vascular study.   ?If tomorrow is a weekday (Monday-Friday), please go to [facility] at 11 am and tell them you are there for a vascular study. ?

## 2021-11-12 NOTE — ED Notes (Signed)
Patient verbalizes understanding of d/c instructions. Opportunities for questions and answers were provided. Pt d/c from ED and ambulated to lobby.  

## 2021-11-12 NOTE — ED Triage Notes (Signed)
Patient complains of right lower leg swelling after having colonoscopy this past week. Patient also concerned that he clipped his right foot middle toe nail too short and now has clear liquid draining from same. Patient denies fever, alert and oriented ?

## 2021-11-13 ENCOUNTER — Ambulatory Visit (HOSPITAL_COMMUNITY)
Admission: RE | Admit: 2021-11-13 | Discharge: 2021-11-13 | Disposition: A | Discharge status: Home / Self Care | Payer: Medicare Other | Source: Ambulatory Visit | Attending: Emergency Medicine | Admitting: Emergency Medicine

## 2021-11-13 DIAGNOSIS — R599 Enlarged lymph nodes, unspecified: Secondary | ICD-10-CM | POA: Insufficient documentation

## 2021-11-13 DIAGNOSIS — R2241 Localized swelling, mass and lump, right lower limb: Secondary | ICD-10-CM

## 2021-11-13 DIAGNOSIS — R609 Edema, unspecified: Secondary | ICD-10-CM | POA: Insufficient documentation

## 2021-11-18 ENCOUNTER — Ambulatory Visit (INDEPENDENT_AMBULATORY_CARE_PROVIDER_SITE_OTHER): Payer: Medicare Other

## 2021-11-18 ENCOUNTER — Telehealth (INDEPENDENT_AMBULATORY_CARE_PROVIDER_SITE_OTHER): Payer: Self-pay

## 2021-11-18 NOTE — Telephone Encounter (Signed)
Attempted to call pt, unable to reach pt twice. Mailbox full unable to leave msg. Appointment cancelled.  ?

## 2021-12-05 ENCOUNTER — Encounter (INDEPENDENT_AMBULATORY_CARE_PROVIDER_SITE_OTHER): Payer: Self-pay | Admitting: Primary Care

## 2021-12-05 ENCOUNTER — Encounter (INDEPENDENT_AMBULATORY_CARE_PROVIDER_SITE_OTHER): Payer: Self-pay

## 2021-12-09 ENCOUNTER — Other Ambulatory Visit (HOSPITAL_COMMUNITY): Payer: Self-pay

## 2021-12-13 ENCOUNTER — Other Ambulatory Visit: Payer: Self-pay

## 2021-12-15 ENCOUNTER — Encounter (HOSPITAL_COMMUNITY): Payer: Self-pay

## 2021-12-15 ENCOUNTER — Other Ambulatory Visit: Payer: Self-pay

## 2021-12-15 ENCOUNTER — Emergency Department (HOSPITAL_COMMUNITY)
Admission: EM | Admit: 2021-12-15 | Discharge: 2021-12-15 | Disposition: A | Discharge status: Home / Self Care | Payer: Medicare Other | Attending: Emergency Medicine | Admitting: Emergency Medicine

## 2021-12-15 ENCOUNTER — Emergency Department (HOSPITAL_COMMUNITY): Payer: Medicare Other

## 2021-12-15 DIAGNOSIS — R319 Hematuria, unspecified: Secondary | ICD-10-CM | POA: Diagnosis present

## 2021-12-15 DIAGNOSIS — Z7984 Long term (current) use of oral hypoglycemic drugs: Secondary | ICD-10-CM | POA: Diagnosis not present

## 2021-12-15 DIAGNOSIS — I10 Essential (primary) hypertension: Secondary | ICD-10-CM | POA: Insufficient documentation

## 2021-12-15 DIAGNOSIS — Z79899 Other long term (current) drug therapy: Secondary | ICD-10-CM | POA: Diagnosis not present

## 2021-12-15 DIAGNOSIS — Z794 Long term (current) use of insulin: Secondary | ICD-10-CM | POA: Diagnosis not present

## 2021-12-15 DIAGNOSIS — Z7982 Long term (current) use of aspirin: Secondary | ICD-10-CM | POA: Insufficient documentation

## 2021-12-15 DIAGNOSIS — R109 Unspecified abdominal pain: Secondary | ICD-10-CM | POA: Diagnosis not present

## 2021-12-15 DIAGNOSIS — R3129 Other microscopic hematuria: Secondary | ICD-10-CM | POA: Insufficient documentation

## 2021-12-15 DIAGNOSIS — E119 Type 2 diabetes mellitus without complications: Secondary | ICD-10-CM | POA: Diagnosis not present

## 2021-12-15 LAB — URINALYSIS, ROUTINE W REFLEX MICROSCOPIC
Bilirubin Urine: NEGATIVE
Glucose, UA: NEGATIVE mg/dL
Ketones, ur: NEGATIVE mg/dL
Leukocytes,Ua: NEGATIVE
Nitrite: NEGATIVE
Protein, ur: NEGATIVE mg/dL
Specific Gravity, Urine: 1.013 (ref 1.005–1.030)
pH: 5 (ref 5.0–8.0)

## 2021-12-15 LAB — CBC WITH DIFFERENTIAL/PLATELET
Abs Immature Granulocytes: 0.02 10*3/uL (ref 0.00–0.07)
Basophils Absolute: 0.1 10*3/uL (ref 0.0–0.1)
Basophils Relative: 1 %
Eosinophils Absolute: 0.3 10*3/uL (ref 0.0–0.5)
Eosinophils Relative: 5 %
HCT: 42.2 % (ref 39.0–52.0)
Hemoglobin: 13.6 g/dL (ref 13.0–17.0)
Immature Granulocytes: 0 %
Lymphocytes Relative: 35 %
Lymphs Abs: 2.6 10*3/uL (ref 0.7–4.0)
MCH: 27.6 pg (ref 26.0–34.0)
MCHC: 32.2 g/dL (ref 30.0–36.0)
MCV: 85.6 fL (ref 80.0–100.0)
Monocytes Absolute: 0.5 10*3/uL (ref 0.1–1.0)
Monocytes Relative: 7 %
Neutro Abs: 3.8 10*3/uL (ref 1.7–7.7)
Neutrophils Relative %: 52 %
Platelets: 128 10*3/uL — ABNORMAL LOW (ref 150–400)
RBC: 4.93 MIL/uL (ref 4.22–5.81)
RDW: 13.6 % (ref 11.5–15.5)
WBC: 7.3 10*3/uL (ref 4.0–10.5)
nRBC: 0 % (ref 0.0–0.2)

## 2021-12-15 LAB — BASIC METABOLIC PANEL
Anion gap: 11 (ref 5–15)
BUN: 8 mg/dL (ref 8–23)
CO2: 19 mmol/L — ABNORMAL LOW (ref 22–32)
Calcium: 9.4 mg/dL (ref 8.9–10.3)
Chloride: 106 mmol/L (ref 98–111)
Creatinine, Ser: 0.99 mg/dL (ref 0.61–1.24)
GFR, Estimated: >60 mL/min (ref >60)
Glucose, Bld: 99 mg/dL (ref 70–99)
Potassium: 4.5 mmol/L (ref 3.5–5.1)
Sodium: 136 mmol/L (ref 135–145)

## 2021-12-15 NOTE — ED Provider Notes (Signed)
[facility] EMERGENCY DEPARTMENT Provider Note   CSN: [id number] Arrival date & time: 12/15/21  1654     History  Chief Complaint  Patient presents with   Hematuria   Penile Discharge    Spencer Robles is a 61 y.o. male.  HPI Patient presents with right-sided intermittent occasional dull flank pain and new hematuria, inconsistent. There may have been some discharge prior to the onset of hematuria, but that does not seem to the case, and he has no history of recent unprotected sex, no fever, vomiting, abdominal pain, scrotal or penile pain or lesions. Onset unclear, but now over the past days in particular he has noticed hematuria.    Home Medications Prior to Admission medications   Medication Sig Start Date End Date Taking? Authorizing Provider  aspirin EC 81 MG tablet Take 1 tablet (81 mg total) by mouth daily. 08/28/19   Florencia Reasons, MD  atorvastatin (LIPITOR) 20 MG tablet Take 1 tablet (20 mg total) by mouth daily. 07/13/21   Kerin Perna, NP  Blood Glucose Monitoring Suppl (TRUE METRIX METER) w/Device KIT Use to check blood sugar up to 3 times daily. 08/28/19   Florencia Reasons, MD  Dulaglutide (TRULICITY) 1.5 AY/3.0ZS SOPN Inject 1.5 mg into the skin once a week. 07/13/21   Kerin Perna, NP  gabapentin (NEURONTIN) 300 MG capsule TAKE 2 CAPSULES (600 MG TOTAL) BY MOUTH 3 (THREE) TIMES DAILY. 07/13/21 07/13/22  Kerin Perna, NP  glucose blood (TRUE METRIX BLOOD GLUCOSE TEST) test strip Use as instructed to check blood sugar up to 3 times daily. 08/28/19   Florencia Reasons, MD  ibuprofen (ADVIL) 800 MG tablet Take 800 mg by mouth every 6 (six) hours as needed for mild pain.     [provider]  Insulin Glargine (BASAGLAR KWIKPEN) 100 UNIT/ML INJECT 20 UNITS INTO THE SKIN DAILY. 07/13/21 07/13/22  Kerin Perna, NP  Insulin Pen Needle (TRUEPLUS PEN NEEDLES) 31G X 5 MM MISC 31.5 mm by Does not apply route 3 times/day as needed-between meals & bedtime.  01/13/20   Kerin Perna, NP  lisinopril (ZESTRIL) 20 MG tablet TAKE 1 TABLET (20 MG TOTAL) BY MOUTH DAILY. 07/13/21 07/13/22  Kerin Perna, NP  metFORMIN (GLUCOPHAGE XR) 500 MG 24 hr tablet Take 1 tablet (500 mg total) by mouth daily with breakfast. 07/13/21   Kerin Perna, NP  polyethylene glycol-electrolytes (NULYTELY) 420 g solution use as directed 08/10/21   Otis Brace, MD  TRUEplus Lancets 28G MISC Use as instructed to check blood sugar up to 3 times daily. 08/28/19   Florencia Reasons, MD  sitaGLIPtin (JANUVIA) 100 MG tablet Take 1 tablet (100 mg total) by mouth daily. 01/13/20 10/20/20  Kerin Perna, NP      Allergies    Patient has no known allergies.    Review of Systems   Review of Systems  All other systems reviewed and are negative.  Physical Exam Updated Vital Signs BP (!) 121/92 (BP Location: Left Arm)   Pulse (!) 55   Temp 98 F (36.7 C) (Oral)   Resp 16   Ht 5' 11" (1.803 m)   Wt 122.5 kg   SpO2 99%   BMI 37.66 kg/m  Physical Exam Vitals and nursing note reviewed.  Constitutional:      General: He is not in acute distress.    Appearance: He is well-developed.  HENT:     Head: Normocephalic and atraumatic.  Eyes:  Conjunctiva/sclera: Conjunctivae normal.  Cardiovascular:     Rate and Rhythm: Normal rate and regular rhythm.  Pulmonary:     Effort: Pulmonary effort is normal. No respiratory distress.     Breath sounds: No stridor.  Abdominal:     General: There is no distension.  Skin:    General: Skin is warm and dry.  Neurological:     Mental Status: He is alert and oriented to person, place, and time.    ED Results / Procedures / Treatments   Labs (all labs ordered are listed, but only abnormal results are displayed) Labs Reviewed  CBC WITH DIFFERENTIAL/PLATELET - Abnormal; Notable for the following components:      Result Value   Platelets 128 (*)    All other components within normal limits  BASIC METABOLIC PANEL -  Abnormal; Notable for the following components:   CO2 19 (*)    All other components within normal limits  URINALYSIS, ROUTINE W REFLEX MICROSCOPIC - Abnormal; Notable for the following components:   Hgb urine dipstick LARGE (*)    Bacteria, UA RARE (*)    All other components within normal limits  URINE CULTURE    EKG None  Radiology CT Renal Stone Study  Result Date: 12/15/2021 CLINICAL DATA:  Right flank pain and hematuria. EXAM: CT ABDOMEN AND PELVIS WITHOUT CONTRAST TECHNIQUE: Multidetector CT imaging of the abdomen and pelvis was performed following the standard protocol without IV contrast. RADIATION DOSE REDUCTION: This exam was performed according to the departmental dose-optimization program which includes automated exposure control, adjustment of the mA and/or kV according to patient size and/or use of iterative reconstruction technique. COMPARISON:  CT pelvis 08/26/2017 FINDINGS: Lower chest: No acute abnormality. Hepatobiliary: No focal liver abnormality is seen. No gallstones, gallbladder wall thickening, or biliary dilatation. Pancreas: Unremarkable. No pancreatic ductal dilatation or surrounding inflammatory changes. Spleen: Normal in size without focal abnormality. Adrenals/Urinary Tract: Adrenal glands are unremarkable. Kidneys are normal, without renal calculi, focal lesion, or hydronephrosis. Bladder is unremarkable. Stomach/Bowel: Stomach is within normal limits. Appendix appears normal. No evidence of bowel wall thickening, distention, or inflammatory changes. Vascular/Lymphatic: No significant vascular findings are present. No enlarged abdominal or pelvic lymph nodes. Reproductive: Prostate is unremarkable. Other: No abdominal wall hernia or abnormality. No abdominopelvic ascites. Musculoskeletal: Degenerative changes affect the spine and hips. IMPRESSION: 1. No acute localizing process in the abdomen or pelvis. Electronically Signed   By: Ronney Asters M.D.   On: 12/15/2021  23:32    Procedures Procedures    Medications Ordered in ED Medications - No data to display  ED Course/ Medical Decision Making/ A&P This patient with a Hx of diabetes, hypertension, epididymitis presents to the ED for concern of flank pain, hematuria, this involves an extensive number of treatment options, and is a complaint that carries with it a high risk of complications and morbidity.    The differential diagnosis includes kidney stone, pyelonephritis, cystitis   Social Determinants of Health:  No limits  Additional history obtained:  Additional history and/or information obtained from chart review, notable for history of epididymitis with scrotal exploration   After the initial evaluation, orders, including: Urinalysis CT were initiated.   The patient was also maintained on pulse oximetry. The readings were typically 99% room air normal   On repeat evaluation of the patient stayed the same  Lab Tests:  I personally interpreted labs.  The pertinent results include: Hematuria, no substantial anemia  Imaging Studies ordered:  I independently visualized  and interpreted imaging which showed no acute intra-abdominal processes no stones I agree with the radiologist interpretation   Dispostion / Final MDM:  After consideration of the diagnostic results and the patient's response to treatment, patient remains awake, alert, in no distress, hemodynamically unremarkable.  We discussed all findings, he notes that he has previously seen [facility].  With hematuria, but no stones, no infection, no bacteremia, no sepsis, the patient is appropriate for outpatient follow-up with consideration of cystoscopy.  Final Clinical Impression(s) / ED Diagnoses Final diagnoses:  Other microscopic hematuria    Rx / DC Orders ED Discharge Orders     None         Carmin Muskrat, MD 12/15/21 2352

## 2021-12-15 NOTE — ED Provider Triage Note (Signed)
Emergency Medicine Provider Triage Evaluation Note  Spencer Robles , a 61 y.o. male  was evaluated in triage.  Pt complains of intermittent hematuria for the last week.  Denies any abdominal pain or dysuria.  No urinary frequency or urgency.  Also states has been having some creamy discharge.  No new sexual partners.  Review of Systems  Positive:  Negative: See above  Physical Exam  BP 138/82   Pulse (!) 58   Temp 98.4 F (36.9 C) (Oral)   Resp 18   Ht '5\' 11"'$  (1.803 m)   Wt 122.5 kg   SpO2 97%   BMI 37.66 kg/m  Gen:   Awake, no distress   Resp:  Normal effort  MSK:   Moves extremities without difficulty  Other:    Medical Decision Making  Medically screening exam initiated at 5:14 PM.  Appropriate orders placed.  Spencer Robles was informed that the remainder of the evaluation will be completed by another provider, this initial triage assessment does not replace that evaluation, and the importance of remaining in the ED until their evaluation is complete.     Spencer Robles, [place] 12/15/21 1717

## 2021-12-15 NOTE — ED Notes (Signed)
Discharge instructions reviewed with patient. Patient verbalized understanding of instructions. Follow-up care and medications were reviewed. Patient ambulatory with steady gait. VSS upon discharge.  ?

## 2021-12-15 NOTE — Discharge Instructions (Signed)
As discussed, your evaluation today has been largely reassuring.  But, it is important that you monitor your condition carefully, and do not hesitate to return to the ED if you develop new, or concerning changes in your condition.  Otherwise, please follow-up with your physician for appropriate ongoing care.  Please discuss your ongoing hematuria or blood in urine and consideration of cystoscopy.

## 2021-12-15 NOTE — ED Triage Notes (Signed)
Pt arrived POV from home c/o hematuria and some clear penile discharge that has been on and off for a couple days. Pt denies any recent sexual intercourse or burning with urination.

## 2021-12-16 ENCOUNTER — Other Ambulatory Visit (INDEPENDENT_AMBULATORY_CARE_PROVIDER_SITE_OTHER): Payer: Self-pay | Admitting: Primary Care

## 2021-12-16 ENCOUNTER — Other Ambulatory Visit: Payer: Self-pay

## 2021-12-16 DIAGNOSIS — Z760 Encounter for issue of repeat prescription: Secondary | ICD-10-CM

## 2021-12-17 LAB — URINE CULTURE: Culture: NO GROWTH

## 2021-12-20 NOTE — Telephone Encounter (Signed)
Routed to PCP 

## 2021-12-23 ENCOUNTER — Other Ambulatory Visit: Payer: Self-pay

## 2021-12-23 MED ORDER — TRULICITY 1.5 MG/0.5ML ~~LOC~~ SOAJ
1.5000 mg | SUBCUTANEOUS | 2 refills | Status: DC
  Filled 2021-12-23: qty 2, 28d supply, fill #0

## 2021-12-28 ENCOUNTER — Other Ambulatory Visit: Payer: Self-pay

## 2022-01-04 ENCOUNTER — Other Ambulatory Visit: Payer: Self-pay

## 2022-01-04 ENCOUNTER — Other Ambulatory Visit (INDEPENDENT_AMBULATORY_CARE_PROVIDER_SITE_OTHER): Payer: Self-pay | Admitting: Primary Care

## 2022-01-04 ENCOUNTER — Other Ambulatory Visit: Payer: Self-pay | Admitting: Pharmacist

## 2022-01-04 DIAGNOSIS — E1140 Type 2 diabetes mellitus with diabetic neuropathy, unspecified: Secondary | ICD-10-CM

## 2022-01-04 NOTE — Chronic Care Management (AMB) (Signed)
Patient seen by Tanja Port, PharmD Candidate on 01/04/22 while they were picking up prescriptions at [place] at [facility].   Patient does not have an automated home blood pressure machine.   Medication review was performed. They state they are taking medications as prescribed, however, this does not match fill history.   The following barriers to adherence were noted: - Denies concerns with medication access or understanding.  The following interventions were completed:  - Medications were reviewed  The patient has follow up scheduled:  PCP: 01/11/22  Will outreach patient later this week to discuss medication adherence  Catie Hedwig Morton, PharmD, [place] [phone]

## 2022-01-04 NOTE — Telephone Encounter (Signed)
Requested medication (s) are due for refill today - expired Rx  Requested medication (s) are on the active medication list -yes  Future visit scheduled -yes  Last refill: 01/13/20 #100 [id number]  Notes to clinic: expired Rx  Requested Prescriptions  Pending Prescriptions Disp Refills   Insulin Pen Needle (TRUEPLUS PEN NEEDLES) 31G X 5 MM MISC 100 each 11    Sig: 31.5 mm by Does not apply route 3 times/day as needed-between meals & bedtime.     Endocrinology: Diabetes - Testing Supplies Passed - 01/04/2022  3:53 PM      Passed - Valid encounter within last 12 months    Recent Outpatient Visits           5 months ago Type 2 diabetes mellitus without complication, with long-term current use of insulin ([place])   [place] [facility] Juluis Mire P, NP   11 months ago Type 2 diabetes mellitus without complication, with long-term current use of insulin ([place])   [place] [facility] Kerin Perna, NP   1 year ago Type 2 diabetes mellitus without complication, with long-term current use of insulin ([place])   [place], Annie Main L, RPH-CPP   1 year ago Type 2 diabetes mellitus without complication, with long-term current use of insulin [facility])   [place], Jarome Matin, RPH-CPP   1 year ago Type 2 diabetes mellitus without complication, with long-term current use of insulin [facility])   [place], Jarome Matin, RPH-CPP       Future Appointments             In 1 week Oletta Lamas, [place] Cage, NP [facility]               Requested Prescriptions  Pending Prescriptions Disp Refills   Insulin Pen Needle (TRUEPLUS PEN NEEDLES) 31G X 5 MM MISC 100 each 11    Sig: 31.5 mm by Does not apply route 3 times/day as needed-between meals & bedtime.     Endocrinology: Diabetes - Testing Supplies Passed - 01/04/2022  3:53 PM       Passed - Valid encounter within last 12 months    Recent Outpatient Visits           5 months ago Type 2 diabetes mellitus without complication, with long-term current use of insulin ([place])   [place] [facility] Juluis Mire P, NP   11 months ago Type 2 diabetes mellitus without complication, with long-term current use of insulin ([place])   [place] [facility] Kerin Perna, NP   1 year ago Type 2 diabetes mellitus without complication, with long-term current use of insulin ([place])   [place], Annie Main L, RPH-CPP   1 year ago Type 2 diabetes mellitus without complication, with long-term current use of insulin [facility])   [place], Jarome Matin, RPH-CPP   1 year ago Type 2 diabetes mellitus without complication, with long-term current use of insulin [facility])   [place], RPH-CPP       Future Appointments             In 1 week Kerin Perna, NP [place]

## 2022-01-04 NOTE — Telephone Encounter (Signed)
Routed to PCP 

## 2022-01-05 ENCOUNTER — Other Ambulatory Visit: Payer: Self-pay

## 2022-01-05 MED ORDER — TECHLITE PEN NEEDLES 31G X 8 MM MISC
1.0000 | Freq: Two times a day (BID) | 33 refills | Status: DC | PRN
  Filled 2022-01-05 – 2022-01-18 (×2): qty 100, 90d supply, fill #0
  Filled 2022-06-27: qty 100, 90d supply, fill #1
  Filled 2022-12-22: qty 100, 90d supply, fill #2

## 2022-01-06 ENCOUNTER — Telehealth: Payer: Self-pay | Admitting: Pharmacist

## 2022-01-06 NOTE — Telephone Encounter (Signed)
Patient attempted to be outreached by Camila Li Day, PharmD Candidate on 01/06/22 to discuss hypertension and follow up on adherence questions from Wednesday's interaction. Left voicemail for patient to return our call at his convenience.    Catie Hedwig Morton, PharmD, [facility] [phone]

## 2022-01-11 ENCOUNTER — Encounter (INDEPENDENT_AMBULATORY_CARE_PROVIDER_SITE_OTHER): Payer: Self-pay | Admitting: Primary Care

## 2022-01-11 ENCOUNTER — Ambulatory Visit (INDEPENDENT_AMBULATORY_CARE_PROVIDER_SITE_OTHER): Payer: Medicare Other | Admitting: Primary Care

## 2022-01-11 ENCOUNTER — Other Ambulatory Visit: Payer: Self-pay

## 2022-01-11 VITALS — BP 131/87 | HR 61 | Temp 98.0°F | Ht 71.0 in | Wt 273.0 lb

## 2022-01-11 DIAGNOSIS — E782 Mixed hyperlipidemia: Secondary | ICD-10-CM

## 2022-01-11 DIAGNOSIS — R202 Paresthesia of skin: Secondary | ICD-10-CM

## 2022-01-11 DIAGNOSIS — E119 Type 2 diabetes mellitus without complications: Secondary | ICD-10-CM

## 2022-01-11 DIAGNOSIS — M79601 Pain in right arm: Secondary | ICD-10-CM

## 2022-01-11 DIAGNOSIS — Z794 Long term (current) use of insulin: Secondary | ICD-10-CM

## 2022-01-11 DIAGNOSIS — Z760 Encounter for issue of repeat prescription: Secondary | ICD-10-CM

## 2022-01-11 DIAGNOSIS — I10 Essential (primary) hypertension: Secondary | ICD-10-CM

## 2022-01-11 DIAGNOSIS — M79602 Pain in left arm: Secondary | ICD-10-CM

## 2022-01-11 DIAGNOSIS — Z1211 Encounter for screening for malignant neoplasm of colon: Secondary | ICD-10-CM

## 2022-01-11 LAB — POCT GLYCOSYLATED HEMOGLOBIN (HGB A1C): Hemoglobin A1C: 6.2 % — AB (ref 4.0–5.6)

## 2022-01-11 MED ORDER — METFORMIN HCL ER 500 MG PO TB24
500.0000 mg | ORAL_TABLET | Freq: Every day | ORAL | 1 refills | Status: DC
  Filled 2022-01-11 – 2022-04-20 (×2): qty 90, 90d supply, fill #0
  Filled 2022-08-18: qty 90, 90d supply, fill #1

## 2022-01-11 MED ORDER — INSULIN GLARGINE 100 UNIT/ML SOLOSTAR PEN
15.0000 [IU] | PEN_INJECTOR | Freq: Every day | SUBCUTANEOUS | 3 refills | Status: DC
  Filled 2022-01-11: qty 10, 66d supply, fill #0
  Filled 2022-05-17: qty 15, 100d supply, fill #0

## 2022-01-11 MED ORDER — LISINOPRIL 20 MG PO TABS
ORAL_TABLET | Freq: Every day | ORAL | 1 refills | Status: DC
  Filled 2022-01-11: qty 90, 90d supply, fill #0

## 2022-01-11 MED ORDER — GABAPENTIN 300 MG PO CAPS
ORAL_CAPSULE | Freq: Three times a day (TID) | ORAL | 5 refills | Status: DC
  Filled 2022-01-11: qty 180, 30d supply, fill #0

## 2022-01-11 MED ORDER — TRULICITY 1.5 MG/0.5ML ~~LOC~~ SOAJ
1.5000 mg | SUBCUTANEOUS | 2 refills | Status: DC
  Filled 2022-01-11 – 2022-02-01 (×2): qty 2, 28d supply, fill #0
  Filled 2022-04-03: qty 2, 28d supply, fill #1
  Filled 2022-05-17: qty 2, 28d supply, fill #2

## 2022-01-11 NOTE — Progress Notes (Signed)
Subjective:  Patient ID: Spencer Robles, male    DOB: 04-24-1961  Age: 61 y.o. MRN: [id number]  CC: Follow-up (DM/HTN)   HPI Spencer Robles presents for follow-up of diabetes and HTN. Patient does not check blood sugar at home  Compliant with meds - Yes Checking CBGs? sometimes  Fasting avg -   Postprandial average -  Exercising regularly? - Yes Watching carbohydrate intake? - Yes Neuropathy ? - Yes Hypoglycemic events - No  - Recovers with :   Pertinent ROS:  Polyuria - No Polydipsia - No Vision problems - No Patient has No headache, No chest pain, No abdominal pain - No Nausea, No new weakness tingling or numbness, No Cough - shortness of breath  Medications as noted below. Taking them regularly without complication/adverse reaction being reported today.   History Spencer Robles has a past medical history of Diabetes mellitus without complication ([place]), Epididymitis (08/15/2017), necrotising fasciitis (06/2017), Scrotal swelling, and Sleep apnea.   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   His family history is not on file.Spencer Robles reports that Spencer Robles has never smoked. Spencer Robles has never used smokeless tobacco. Spencer Robles reports that Spencer Robles does not drink alcohol and does not use drugs.  Current Outpatient Medications on File Prior to Visit  Medication Sig Dispense Refill   aspirin EC 81 MG tablet Take 1 tablet (81 mg total) by mouth daily. 90 tablet 0   atorvastatin (LIPITOR) 20 MG tablet Take 1 tablet (20 mg total) by mouth daily. 90 tablet 1   Blood Glucose Monitoring Suppl (TRUE METRIX METER) w/Device KIT Use to check blood sugar up to 3 times daily. 1 kit 0   glucose blood (TRUE METRIX BLOOD GLUCOSE TEST) test strip Use as instructed to check blood sugar up to 3 times daily. 100 each 11   ibuprofen (ADVIL) 800 MG tablet Take 800 mg by mouth every 6  (six) hours as needed for mild pain.      Insulin Pen Needle (TECHLITE PEN NEEDLES) 31G X 8 MM MISC Use as directed. 100 each 33   polyethylene glycol-electrolytes (NULYTELY) 420 g solution use as directed 4000 mL 0   TRUEplus Lancets 28G MISC Use as instructed to check blood sugar up to 3 times daily. 100 each 11   [DISCONTINUED] sitaGLIPtin (JANUVIA) 100 MG tablet Take 1 tablet (100 mg total) by mouth daily. 90 tablet 1   No current facility-administered medications on file prior to visit.    ROS Comprehensive ROS Pertinent positive and negative noted in HPI    Objective:  BP 131/87   Pulse 61   Temp 98 F (36.7 C) (Oral)   Ht _0  (1.803 m)   Wt 273 lb (123.8 kg)   SpO2 95%   BMI 38.08 kg/m   BP Readings from Last 3 Encounters:  01/11/22 131/87  01/04/22 (!) 158/91  12/15/21 (!) 121/92    Wt Readings from Last 3 Encounters:  01/11/22 273 lb (123.8 kg)  12/15/21 270 lb (122.5 kg)  07/13/21 284 lb 9.6 oz (129.1 kg)    Physical Exam Vitals reviewed.  Constitutional:      Appearance: Spencer Robles is obese.  HENT:     Head: Normocephalic.     Right Ear: Tympanic membrane and external ear normal.     Left Ear: External ear normal. There is impacted cerumen.     Nose: Nose normal.  Eyes:  Extraocular Movements: Extraocular movements intact.     Pupils: Pupils are equal, round, and reactive to light.  Cardiovascular:     Rate and Rhythm: Normal rate and regular rhythm.  Pulmonary:     Effort: Pulmonary effort is normal.     Breath sounds: Normal breath sounds.  Abdominal:     General: Bowel sounds are normal. There is distension.     Palpations: Abdomen is soft.  Musculoskeletal:        General: Normal range of motion.     Cervical back: Normal range of motion and neck supple.  Skin:    General: Skin is warm and dry.  Neurological:     Mental Status: Spencer Robles is alert and oriented to person, place, and time.  Psychiatric:        Behavior: Behavior normal.    Lab  Results  Component Value Date   HGBA1C 6.2 (A) 01/11/2022   HGBA1C 6.6 (A) 07/13/2021   HGBA1C 6.4 (A) 02/03/2021    Lab Results  Component Value Date   WBC 7.3 12/15/2021   HGB 13.6 12/15/2021   HCT 42.2 12/15/2021   PLT 128 (L) 12/15/2021   GLUCOSE 99 12/15/2021   CHOL 157 07/13/2021   TRIG 85 07/13/2021   HDL 42 07/13/2021   LDLCALC 99 07/13/2021   ALT 18 11/12/2021   AST 23 11/12/2021   NA 136 12/15/2021   K 4.5 12/15/2021   CL 106 12/15/2021   CREATININE 0.99 12/15/2021   BUN 8 12/15/2021   CO2 19 (L) 12/15/2021   HGBA1C 6.2 (A) 01/11/2022     Assessment & Plan:  Spencer Robles was seen today for follow-up.  Diagnoses and all orders for this visit:  Type 2 diabetes mellitus without complication, with long-term current use of insulin (HCC) -     HgB A1c 6.2 improving - changed Basiglar to Lantus per insurance new dose 15 Continue to monitor foods that are high in carbohydrates are the following rice, potatoes, breads, sugars, and pastas.  Reduction in the intake (eating) will assist in lowering your blood sugars.  -     lisinopril (ZESTRIL) 20 MG tablet; TAKE 1 TABLET (20 MG TOTAL) BY MOUTH DAILY. -     gabapentin (NEURONTIN) 300 MG capsule; TAKE 2 CAPSULES (600 MG TOTAL) BY MOUTH 3 (THREE) TIMES DAILY. Follow up with clinical pharmacist in a month  Essential hypertension BP goal - < 140/90 .  Blood pressure is at goal and medications are helping to get to goal and maintain normal blood pressure. DIET: Limit salt intake, read nutrition labels to check salt content, limit fried and high fatty foods  Avoid using multisymptom OTC cold preparations that generally contain sudafed which can rise BP. Consult with pharmacist on best cold relief products to use for persons with HTN EXERCISE Discussed incorporating exercise such as walking - 30 minutes most days of the week and can do in 10 minute intervals    -     lisinopril (ZESTRIL) 20 MG tablet; TAKE 1 TABLET (20 MG TOTAL)  BY MOUTH DAILY.  Mixed hyperlipidemia  Healthy lifestyle diet of fruits vegetables fish nuts whole grains and low saturated fat . Foods high in cholesterol or liver, fatty meats,cheese, butter avocados, nuts and seeds, chocolate and fried foods. -     Lipid Panel  Medication refill -     metFORMIN (GLUCOPHAGE-XR) 500 MG 24 hr tablet; Take 1 tablet (500 mg total) by mouth daily with breakfast. -  lisinopril (ZESTRIL) 20 MG tablet; TAKE 1 TABLET (20 MG TOTAL) BY MOUTH DAILY. -     gabapentin (NEURONTIN) 300 MG capsule; TAKE 2 CAPSULES (600 MG TOTAL) BY MOUTH 3 (THREE) TIMES DAILY. -     Dulaglutide (TRULICITY) 1.5 JO/8.4ZY SOPN; Inject 1.5 mg into the skin once a week. -     insulin glargine (LANTUS) 100 UNIT/ML injection; Inject 0.15 mLs (15 Units total) into the skin daily.  Paresthesia and pain of both upper extremities -     gabapentin (NEURONTIN) 300 MG capsule; TAKE 2 CAPSULES (600 MG TOTAL) BY MOUTH 3 (THREE) TIMES DAILY.  Paresthesia of both lower extremities -     gabapentin (NEURONTIN) 300 MG capsule; TAKE 2 CAPSULES (600 MG TOTAL) BY MOUTH 3 (THREE) TIMES DAILY.    I have changed Spencer Robles Kitchen Robles's Basaglar KwikPen to insulin glargine. I am also having him maintain his ibuprofen, aspirin EC, True Metrix Meter, True Metrix Blood Glucose Test, TRUEplus Lancets 28G, atorvastatin, polyethylene glycol-electrolytes, TechLite Pen Needles, metFORMIN, lisinopril, gabapentin, and Trulicity.  Meds ordered this encounter  Medications   metFORMIN (GLUCOPHAGE-XR) 500 MG 24 hr tablet    Sig: Take 1 tablet (500 mg total) by mouth daily with breakfast.    Dispense:  90 tablet    Refill:  1    Order Specific Question:   Supervising Provider    Answer:   Tresa Garter [[id number]]   lisinopril (ZESTRIL) 20 MG tablet    Sig: TAKE 1 TABLET (20 MG TOTAL) BY MOUTH DAILY.    Dispense:  90 tablet    Refill:  1    Order Specific Question:   Supervising Provider    Answer:   Tresa Garter [[id number]]   gabapentin (NEURONTIN) 300 MG capsule    Sig: TAKE 2 CAPSULES (600 MG TOTAL) BY MOUTH 3 (THREE) TIMES DAILY.    Dispense:  180 capsule    Refill:  5    Order Specific Question:   Supervising Provider    Answer:   Tresa Garter [[id number]]   Dulaglutide (TRULICITY) 1.5 UR/4.2HC SOPN    Sig: Inject 1.5 mg into the skin once a week.    Dispense:  2 mL    Refill:  2    Order Specific Question:   Supervising Provider    Answer:   Tresa Garter [[id number]]   insulin glargine (LANTUS) 100 UNIT/ML injection    Sig: Inject 0.15 mLs (15 Units total) into the skin daily.    Dispense:  10 mL    Refill:  3    Order Specific Question:   Supervising Provider    Answer:   Tresa Garter [[id number]]     Follow-up:   Return in about 6 months (around 07/13/2022) for DM.  The above assessment and management plan was discussed with the patient. The patient verbalized understanding of and has agreed to the management plan. Patient is aware to call the clinic if symptoms fail to improve or worsen. Patient is aware when to return to the clinic for a follow-up visit. Patient educated on when it is appropriate to go to the emergency department.   Juluis Mire, NP-C

## 2022-01-12 ENCOUNTER — Other Ambulatory Visit: Payer: Self-pay

## 2022-01-12 LAB — LIPID PANEL
Chol/HDL Ratio: 3.6 ratio (ref 0.0–5.0)
Cholesterol, Total: 171 mg/dL (ref 100–199)
HDL: 48 mg/dL (ref >39)
LDL Chol Calc (NIH): 106 mg/dL — ABNORMAL HIGH (ref 0–99)
Triglycerides: 95 mg/dL (ref 0–149)
VLDL Cholesterol Cal: 17 mg/dL (ref 5–40)

## 2022-01-18 ENCOUNTER — Other Ambulatory Visit: Payer: Self-pay

## 2022-01-18 ENCOUNTER — Other Ambulatory Visit: Payer: Self-pay | Admitting: Pharmacist

## 2022-01-18 ENCOUNTER — Other Ambulatory Visit (INDEPENDENT_AMBULATORY_CARE_PROVIDER_SITE_OTHER): Payer: Self-pay | Admitting: Primary Care

## 2022-01-18 DIAGNOSIS — E782 Mixed hyperlipidemia: Secondary | ICD-10-CM

## 2022-01-18 MED ORDER — ATORVASTATIN CALCIUM 20 MG PO TABS
20.0000 mg | ORAL_TABLET | Freq: Every day | ORAL | 1 refills | Status: DC
  Filled 2022-01-18 – 2022-09-20 (×3): qty 90, 90d supply, fill #0

## 2022-01-18 NOTE — Chronic Care Management (AMB) (Signed)
Patient seen by Tanja Port, PharmD Candidate on 01/18/22 while they were picking up prescriptions at [place] at [facility].   Patient does not have an automated home blood pressure machine.   Medication review was performed. They are taking medications as prescribed. Noted that he is not taking a statin due to "bile production"  The following barriers to adherence were noted: - Denies concerns with medication access or understanding.  The following interventions were completed:  - Medications were reviewed - Patient was educated on medications, including indication and administration - Patient was educated on use of adherence strategies, like a pill box or alarms - Patient was counseled on lifestyle modifications to improve blood pressure  Discussed symptoms of high and low BP, diet, exercise, salt intake, improtance of adherance, BP cuff use and logging, fluid intake and sleep habits. Gave BP slip for today and wrote goals (walks, painting and rest)  The patient has follow up scheduled:  PCP: 02/02/22  Will notify PCP and embedded pharmacist of statin nonadherence.   Catie Hedwig Morton, PharmD, [facility] [phone]

## 2022-01-19 ENCOUNTER — Other Ambulatory Visit: Payer: Self-pay

## 2022-02-01 ENCOUNTER — Encounter (INDEPENDENT_AMBULATORY_CARE_PROVIDER_SITE_OTHER): Payer: Self-pay | Admitting: Primary Care

## 2022-02-01 ENCOUNTER — Other Ambulatory Visit: Payer: Self-pay

## 2022-02-02 ENCOUNTER — Encounter (INDEPENDENT_AMBULATORY_CARE_PROVIDER_SITE_OTHER): Payer: Self-pay | Admitting: Primary Care

## 2022-02-02 ENCOUNTER — Ambulatory Visit (INDEPENDENT_AMBULATORY_CARE_PROVIDER_SITE_OTHER): Payer: Medicare Other | Admitting: Primary Care

## 2022-02-02 VITALS — BP 132/82 | HR 49 | Temp 97.9°F | Ht 71.0 in | Wt 275.4 lb

## 2022-02-02 DIAGNOSIS — H6121 Impacted cerumen, right ear: Secondary | ICD-10-CM

## 2022-02-05 NOTE — Progress Notes (Signed)
[facility]    CC:EAR PAIN  SUBJECTIVE: Spencer Robles is a 61 y.o. male who presents with of right ear cerumen removal.  Denies a precipitating event, such as swimming or wearing ear plugs.  Patient states he has no pain constant and achy in character.   Denies fever, chills, fatigue, sinus pain, rhinorrhea, ear discharge, sore throat, SOB, wheezing, chest pain, nausea, changes in bowel or bladder habits.    ROS: As per HPI.  All other pertinent ROS negative.     Past Medical History:  Diagnosis Date   Diabetes mellitus without complication (HCC)    Epididymitis 08/15/2017   Hx of necrotising fasciitis 06/2017   RLE/notes 08/15/2017   Scrotal swelling    left; due to Epididymitis/notes 08/15/2017   Sleep apnea    does not use a cpap   Past Surgical History:  Procedure Laterality Date   AMPUTATION TOE Right 07/06/2017   Procedure: RIGHT GREAT TOE AND SECOND TOE AMPUTATION;  Surgeon: Duda, Marcus V, MD;  Location: [facility];  Service: Orthopedics;  Laterality: Right;   CIRCUMCISION     MASS BIOPSY  ~ 2008   scrotum/notes 08/15/2017   SCROTAL EXPLORATION N/A 08/21/2017   Procedure: INCISION AND DRAINAGE SCROTAL ABSCESS, CYSTOSCOPY, COMPLEX INSERTION OF URETHERAL FOLEY CATHETER;  Surgeon: Borden, Lester, MD;  Location: [facility];  Service: Urology;  Laterality: N/A;   SCROTAL EXPLORATION N/A 08/30/2017   Procedure: SCROTUM DEBRIDEMENT;  Surgeon: Borden, Lester, MD;  Location: [facility];  Service: Urology;  Laterality: N/A;   No Known Allergies Current Outpatient Medications on File Prior to Visit  Medication Sig Dispense Refill   aspirin EC 81 MG tablet Take 1 tablet (81 mg total) by mouth daily. 90 tablet 0   atorvastatin (LIPITOR) 20 MG tablet Take 1 tablet (20 mg total) by mouth daily. 90 tablet 1   Blood Glucose Monitoring Suppl (TRUE METRIX METER) w/Device KIT Use to check blood sugar up to 3 times daily. 1 kit 0   Dulaglutide (TRULICITY) 1.5 MG/0.5ML SOPN Inject 1.5 mg  into the skin once a week. 2 mL 2   gabapentin (NEURONTIN) 300 MG capsule TAKE 2 CAPSULES (600 MG TOTAL) BY MOUTH 3 (THREE) TIMES DAILY. 180 capsule 5   glucose blood (TRUE METRIX BLOOD GLUCOSE TEST) test strip Use as instructed to check blood sugar up to 3 times daily. 100 each 11   ibuprofen (ADVIL) 800 MG tablet Take 800 mg by mouth every 6 (six) hours as needed for mild pain.      insulin glargine (LANTUS) 100 UNIT/ML injection Inject 0.15 mLs (15 Units total) into the skin daily. 10 mL 3   Insulin Pen Needle (TECHLITE PEN NEEDLES) 31G X 8 MM MISC Use as directed. 100 each 33   lisinopril (ZESTRIL) 20 MG tablet TAKE 1 TABLET (20 MG TOTAL) BY MOUTH DAILY. 90 tablet 1   metFORMIN (GLUCOPHAGE-XR) 500 MG 24 hr tablet Take 1 tablet (500 mg total) by mouth daily with breakfast. 90 tablet 1   polyethylene glycol-electrolytes (NULYTELY) 420 g solution use as directed 4000 mL 0   TRUEplus Lancets 28G MISC Use as instructed to check blood sugar up to 3 times daily. 100 each 11   [DISCONTINUED] sitaGLIPtin (JANUVIA) 100 MG tablet Take 1 tablet (100 mg total) by mouth daily. 90 tablet 1   No current facility-administered medications on file prior to visit.   Social History   Socioeconomic History   Marital status: Married    Spouse name:   Not on file   Number of children: Not on file   Years of education: Not on file   Highest education level: Not on file  Occupational History   Not on file  Tobacco Use   Smoking status: Never   Smokeless tobacco: Never  Vaping Use   Vaping Use: Never used  Substance and Sexual Activity   Alcohol use: No    Alcohol/week: 0.0 standard drinks of alcohol   Drug use: No   Sexual activity: Not on file  Other Topics Concern   Not on file  Social History Narrative   ** Merged History Encounter **       Social Determinants of Health   Financial Resource Strain: Not on file  Food Insecurity: Not on file  Transportation Needs: Not on file  Physical  Activity: Not on file  Stress: Not on file  Social Connections: Not on file  Intimate Partner Violence: Not on file   No family history on file.  OBJECTIVE:  Vitals:   02/02/22 1342  BP: 132/82  Pulse: (!) 49  Temp: 97.9 F (36.6 C)  TempSrc: Oral  SpO2: 96%  Weight: 275 lb 6.4 oz (124.9 kg)  Height: 5' 11" (1.803 m)     General appearance: alert; appears fatigued HEENT: Ears: EACs clear, TMs pearly gray with visible cone of light left ear only,cerumen in right ear, without erythema; Eyes: PERRL, EOMI grossly; Sinuses nontender to palpation; Nose: clear rhinorrhea; Throat: oropharynx mildly erythematous, tonsils 1+ without white tonsillar exudates, uvula midline Neck: supple without LAD Lungs: unlabored respirations, symmetrical air entry; cough: absent; no respiratory distress Heart: regular rate and rhythm.  Radial pulses 2+ symmetrical bilaterally Skin: warm and dry Psychological: alert and cooperative; normal mood and affect  ASSESSMENT & PLAN: Impacted cerumen of right ear PROCEDURE: CERUMEN DISIMPACTION   The patient had a large amount of cerumen in the external auditory canal(s): right Ear wax softener was used prior to the lavage. Ear lavage was performed on right ear(s)  Curettage by provider was not performed in addition. There were no complications and following the disimpaction the tympanic membrane was visible. Charges to be entered in Charge Capture section.   This note has been created with Surveyor, quantity. Any transcriptional errors are unintentional.   Kerin Perna, NP 02/05/2022, 10:00 PM

## 2022-02-20 ENCOUNTER — Ambulatory Visit: Payer: Medicare Other | Attending: Primary Care | Admitting: Pharmacist

## 2022-02-20 ENCOUNTER — Other Ambulatory Visit: Payer: Self-pay

## 2022-02-20 DIAGNOSIS — I10 Essential (primary) hypertension: Secondary | ICD-10-CM | POA: Diagnosis present

## 2022-02-20 DIAGNOSIS — E119 Type 2 diabetes mellitus without complications: Secondary | ICD-10-CM | POA: Insufficient documentation

## 2022-02-20 DIAGNOSIS — Z79899 Other long term (current) drug therapy: Secondary | ICD-10-CM | POA: Insufficient documentation

## 2022-02-20 DIAGNOSIS — Z91148 Patient's other noncompliance with medication regimen for other reason: Secondary | ICD-10-CM | POA: Diagnosis not present

## 2022-02-20 DIAGNOSIS — Z760 Encounter for issue of repeat prescription: Secondary | ICD-10-CM | POA: Diagnosis not present

## 2022-02-20 DIAGNOSIS — Z794 Long term (current) use of insulin: Secondary | ICD-10-CM

## 2022-02-20 MED ORDER — LISINOPRIL 20 MG PO TABS
ORAL_TABLET | Freq: Every day | ORAL | 1 refills | Status: DC
  Filled 2022-02-20 – 2022-03-23 (×2): qty 90, 90d supply, fill #0
  Filled 2022-08-18: qty 90, 90d supply, fill #1

## 2022-02-20 NOTE — Progress Notes (Signed)
   S:     No chief complaint on file.  Spencer Robles is a 61 y.o. male who presents for hypertension evaluation, education, and management. PMH is significant for T2DM and HTN. Patient was referred by Brock Ra, PharmD on 01/18/22. Patient was last seen by Primary Care Provider, Juluis Mire, on 02/02/22.   Today, patient arrives in spirits and presents without assistance. Denies dizziness, headache, blurred vision, swelling. Reports he has been out of his lisinopril for 2-3 days. He has also been off of his statin.   Medication adherence denied as he has been out of lisinopril . Patient has NOT taken BP medications today.   Current antihypertensives include: lisinopril 20 mg daily  Patient-reported exercise habits: walk many miles a day.   O:  Vitals:   02/20/22 1036  BP: 111/70  Pulse: (!) 48    Last 3 Office BP readings: BP Readings from Last 3 Encounters:  02/20/22 111/70  02/02/22 132/82  01/18/22 132/79    BMET    Component Value Date/Time   NA 136 12/15/2021 1720   NA 143 07/13/2021 1117   K 4.5 12/15/2021 1720   CL 106 12/15/2021 1720   CO2 19 (L) 12/15/2021 1720   GLUCOSE 99 12/15/2021 1720   BUN 8 12/15/2021 1720   BUN 13 07/13/2021 1117   CREATININE 0.99 12/15/2021 1720   CREATININE 0.92 08/17/2015 1236   CALCIUM 9.4 12/15/2021 1720   GFRNONAA >60 12/15/2021 1720   GFRNONAA >89 08/17/2015 1236   GFRAA 92 04/14/2020 0913   GFRAA >89 08/17/2015 1236    Renal function: CrCl cannot be calculated (Patient's most recent lab result is older than the maximum 21 days allowed.).  Clinical ASCVD: No  The 10-year ASCVD risk score (Arnett DK, et al., 2019) is: 20.3%   Values used to calculate the score:     Age: 27 years     Sex: Male     Is Non-Hispanic African American: Yes     Diabetic: Yes     Tobacco smoker: No     Systolic Blood Pressure: 242 mmHg     Is BP treated: Yes     HDL Cholesterol: 48 mg/dL     Total Cholesterol: 171  mg/dL   A/P: Hypertension, currently controlled on current medications. BP goal < 130/80 mmHg. Medication adherence appears optimal. -Continued lisinopril 20 mg as BP at goal with patient being out of medication for a few days. -Counseled on lifestyle modifications for blood pressure control including reduced dietary sodium, increased exercise, adequate sleep. -Encouraged patient to check BP at home and bring log of readings to next visit. Counseled on proper use of home BP cuff.   Of note, discussed statin use with patient. He states he read online that a doctor was talking about how statins increase bile acid. Discussed how statins work. Also discussed the risks versus benefits of statins and patient would like to do more research before restarting statin.   Written patient instructions provided. Patient verbalized understanding of treatment plan.  Total time in face to face counseling 25 minutes.    Follow-up:  Pharmacist PRN. PCP clinic visit in 07/13/2022.   Joseph Art, Pharm.D. PGY-2 Ambulatory Care Pharmacy Resident 02/20/2022 11:38 AM

## 2022-02-27 ENCOUNTER — Other Ambulatory Visit: Payer: Self-pay

## 2022-03-03 ENCOUNTER — Encounter (INDEPENDENT_AMBULATORY_CARE_PROVIDER_SITE_OTHER): Payer: Self-pay

## 2022-03-03 ENCOUNTER — Ambulatory Visit (INDEPENDENT_AMBULATORY_CARE_PROVIDER_SITE_OTHER): Payer: Medicare Other

## 2022-03-03 DIAGNOSIS — Z0000 Encounter for general adult medical examination without abnormal findings: Secondary | ICD-10-CM | POA: Diagnosis not present

## 2022-03-03 NOTE — Progress Notes (Signed)
Subjective:   Spencer Robles is a 61 y.o. male who presents for an Initial Medicare Annual Wellness Visit.   I connected with  Spencer Robles on 03/03/22 by a audio enabled telemedicine application and verified that I am speaking with the correct person using two identifiers.  Patient Location: Other:  [facility] office  Provider Location: Office/Clinic  I discussed the limitations of evaluation and management by telemedicine. The patient expressed understanding and agreed to proceed.     Objective:    There were no vitals filed for this visit. There is no height or weight on file to calculate BMI.     11/12/2021    5:55 PM 08/25/2019    5:03 PM 04/16/2018    5:19 PM 08/30/2017    1:32 PM 08/30/2017    2:30 AM 08/15/2017    9:59 PM 07/06/2017   10:04 AM  Advanced Directives  Does Patient Have a Medical Advance Directive? No No No No No No No  Would patient like information on creating a medical advance directive?  No - Patient declined No - Patient declined No - Patient declined No - Patient declined Yes (Inpatient - patient defers creating a medical advance directive at this time) No - Patient declined    Current Medications (verified) Outpatient Encounter Medications as of 03/03/2022  Medication Sig   aspirin EC 81 MG tablet Take 1 tablet (81 mg total) by mouth daily.   atorvastatin (LIPITOR) 20 MG tablet Take 1 tablet (20 mg total) by mouth daily.   Blood Glucose Monitoring Suppl (TRUE METRIX METER) w/Device KIT Use to check blood sugar up to 3 times daily.   Dulaglutide (TRULICITY) 1.5 HF/0.2OV SOPN Inject 1.5 mg into the skin once a week.   gabapentin (NEURONTIN) 300 MG capsule TAKE 2 CAPSULES (600 MG TOTAL) BY MOUTH 3 (THREE) TIMES DAILY.   glucose blood (TRUE METRIX BLOOD GLUCOSE TEST) test strip Use as instructed to check blood sugar up to 3 times daily.   ibuprofen (ADVIL) 800 MG tablet Take 800 mg by mouth every 6 (six) hours as needed for mild pain.    insulin glargine  (LANTUS) 100 UNIT/ML injection Inject 0.15 mLs (15 Units total) into the skin daily.   Insulin Pen Needle (TECHLITE PEN NEEDLES) 31G X 8 MM MISC Use as directed.   lisinopril (ZESTRIL) 20 MG tablet TAKE 1 TABLET (20 MG TOTAL) BY MOUTH DAILY.   metFORMIN (GLUCOPHAGE-XR) 500 MG 24 hr tablet Take 1 tablet (500 mg total) by mouth daily with breakfast.   polyethylene glycol-electrolytes (NULYTELY) 420 g solution use as directed   TRUEplus Lancets 28G MISC Use as instructed to check blood sugar up to 3 times daily.   [DISCONTINUED] sitaGLIPtin (JANUVIA) 100 MG tablet Take 1 tablet (100 mg total) by mouth daily.   No facility-administered encounter medications on file as of 03/03/2022.    Allergies (verified) Patient has no known allergies.   History: Past Medical History:  Diagnosis Date   Diabetes mellitus without complication ([place])    Epididymitis 08/15/2017   Hx of necrotising fasciitis 06/2017   RLE/notes 08/15/2017   Scrotal swelling    left; due to Epididymitis/notes 08/15/2017   Sleep apnea    does not use a cpap   Past Surgical History:  Procedure Laterality Date   AMPUTATION TOE Right 07/06/2017   Procedure: RIGHT GREAT TOE AND SECOND TOE AMPUTATION;  Surgeon: Newt Minion, MD;  Location: [facility];  Service: Orthopedics;  Laterality: Right;   CIRCUMCISION  MASS BIOPSY  ~ 2008   scrotum/notes 08/15/2017   SCROTAL EXPLORATION N/A 08/21/2017   Procedure: INCISION AND DRAINAGE SCROTAL ABSCESS, CYSTOSCOPY, COMPLEX INSERTION OF URETHERAL FOLEY CATHETER;  Surgeon: Raynelle Bring, MD;  Location: [facility];  Service: Urology;  Laterality: N/A;   SCROTAL EXPLORATION N/A 08/30/2017   Procedure: SCROTUM DEBRIDEMENT;  Surgeon: Raynelle Bring, MD;  Location: [facility];  Service: Urology;  Laterality: N/A;   No family history on file. Social History   Socioeconomic History   Marital status: Married    Spouse name: Not on file   Number of children: Not on file   Years of education: Not on file    Highest education level: Not on file  Occupational History   Not on file  Tobacco Use   Smoking status: Never   Smokeless tobacco: Never  Vaping Use   Vaping Use: Never used  Substance and Sexual Activity   Alcohol use: No    Alcohol/week: 0.0 standard drinks of alcohol   Drug use: No   Sexual activity: Not on file  Other Topics Concern   Not on file  Social History Narrative   ** Merged History Encounter **       Social Determinants of Health   Financial Resource Strain: Low Risk  (03/03/2022)   Overall Financial Resource Strain (CARDIA)    Difficulty of Paying Living Expenses: Not hard at all  Food Insecurity: No Food Insecurity (03/03/2022)   Hunger Vital Sign    Worried About Running Out of Food in the Last Year: Never true    Ran Out of Food in the Last Year: Never true  Transportation Needs: No Transportation Needs (03/03/2022)   PRAPARE - Hydrologist (Medical): No    Lack of Transportation (Non-Medical): No  Physical Activity: Sufficiently Active (03/03/2022)   Exercise Vital Sign    Days of Exercise per Week: 7 days    Minutes of Exercise per Session: 60 min  Stress: No Stress Concern Present (03/03/2022)   [place]    Feeling of Stress : Not at all  Social Connections: Moderately Integrated (03/03/2022)   Social Connection and Isolation Panel [NHANES]    Frequency of Communication with Friends and Family: Twice a week    Frequency of Social Gatherings with Friends and Family: Once a week    Attends Religious Services: More than 4 times per year    Active Member of [employer] or Organizations: Yes    Attends Music therapist: More than 4 times per year    Marital Status: Divorced    Tobacco Counseling Counseling given: Not Answered   Clinical Intake:  Pre-visit preparation completed: Yes  Pain : No/denies pain     Diabetes: Yes  How often do you  need to have someone help you when you read instructions, pamphlets, or other written materials from your doctor or pharmacy?: 1 - Never What is the last grade level you completed in school?: masters in chemistry  Diabetic?yes Nutrition Risk Assessment:  Has the patient had any N/V/D within the last 2 months?  No  Does the patient have any non-healing wounds?  No  Has the patient had any unintentional weight loss or weight gain?  No   Diabetes:  Is the patient diabetic?  Yes  If diabetic, was a CBG obtained today?  No  Did the patient bring in their glucometer from home?   na How often  do you monitor your CBG's? Not often enough.   Financial Strains and Diabetes Management:  Are you having any financial strains with the device, your supplies or your medication? No .  Does the patient want to be seen by Chronic Care Management for management of their diabetes?  No  Would the patient like to be referred to a Nutritionist or for Diabetic Management?  No   Diabetic Exams:  Diabetic Eye Exam: Completed NOT Diabetic Foot Exam: Completed [id number]   Interpreter Needed?: No      Activities of Daily Living    03/03/2022    1:10 PM  In your present state of health, do you have any difficulty performing the following activities:  Hearing? 0  Vision? 1  Difficulty concentrating or making decisions? 0  Walking or climbing stairs? 0  Dressing or bathing? 0  Doing errands, shopping? 0  Preparing Food and eating ? N  Using the Toilet? N  In the past six months, have you accidently leaked urine? N  Do you have problems with loss of bowel control? N  Managing your Medications? N  Managing your Finances? N  Housekeeping or managing your Housekeeping? N    Patient Care Team: Kerin Perna, NP as PCP - General (Internal Medicine) Tawny Asal (Physician Assistant)  Indicate any recent Medical Services you may have received from other than [facility] providers in the  past year (date may be approximate).     Assessment:   This is a routine wellness examination for Spencer Robles.  Hearing/Vision screen No results found.  Dietary issues and exercise activities discussed:     Goals Addressed   None   Depression Screen    03/03/2022    1:07 PM 02/02/2022    1:43 PM 01/11/2022   11:04 AM 07/13/2021   10:41 AM 02/03/2021   11:24 AM 10/20/2020    4:30 PM 04/14/2020    8:36 AM  PHQ 2/9 Scores  PHQ - 2 Score 0 0 0 0 0 0 0  PHQ- 9 Score    0       Fall Risk    03/03/2022    1:10 PM 02/02/2022    1:42 PM 01/11/2022   11:04 AM 07/13/2021   10:40 AM 02/03/2021   11:24 AM  Fall Risk   Falls in the past year? 0 0 0 0 0  Number falls in past yr: 0      Injury with Fall? 0      Risk for fall due to : No Fall Risks      Follow up Falls evaluation completed        FALL RISK PREVENTION PERTAINING TO THE HOME:  Any stairs in or around the home? No  If so, are there any without handrails? No  Home free of loose throw rugs in walkways, pet beds, electrical cords, etc? Yes  Adequate lighting in your home to reduce risk of falls? Yes   ASSISTIVE DEVICES UTILIZED TO PREVENT FALLS:  Life alert? No  Use of a cane, walker or w/c? No  Grab bars in the bathroom? No  Shower chair or bench in shower? No  Elevated toilet seat or a handicapped toilet? No  Cognitive Function:        03/03/2022    1:11 PM  6CIT Screen  What Year? 0 points  What month? 0 points  What time? 0 points  Count back from 20 0 points  Months in reverse 0 points  Repeat phrase 4 points  Total Score 4 points    Immunizations Immunization History  Administered Date(s) Administered   Influenza,inj,Quad PF,6+ Mos 04/15/2015   Pneumococcal Polysaccharide-23 01/08/2018   Tdap 06/05/2018, 06/09/2021    TDAP status: Up to date  Flu Vaccine status: Declined, Education has been provided regarding the importance of this vaccine but patient still declined. Advised may receive this  vaccine at local pharmacy or Health Dept. Aware to provide a copy of the vaccination record if obtained from local pharmacy or Health Dept. Verbalized acceptance and understanding.  Pneumococcal vaccine status: Declined,  Education has been provided regarding the importance of this vaccine but patient still declined. Advised may receive this vaccine at local pharmacy or Health Dept. Aware to provide a copy of the vaccination record if obtained from local pharmacy or Health Dept. Verbalized acceptance and understanding.   Covid-19 vaccine status: Declined, Education has been provided regarding the importance of this vaccine but patient still declined. Advised may receive this vaccine at local pharmacy or Health Dept.or vaccine clinic. Aware to provide a copy of the vaccination record if obtained from local pharmacy or Health Dept. Verbalized acceptance and understanding.  Qualifies for Shingles Vaccine? Yes   Zostavax completed No   Shingrix Completed?: No.    Education has been provided regarding the importance of this vaccine. Patient has been advised to call insurance company to determine out of pocket expense if they have not yet received this vaccine. Advised may also receive vaccine at local pharmacy or Health Dept. Verbalized acceptance and understanding.  Screening Tests Health Maintenance  Topic Date Due   COVID-19 Vaccine (1) Never done   Zoster Vaccines- Shingrix (1 of 2) Never done   OPHTHALMOLOGY EXAM  02/12/2021   FOOT EXAM  02/03/2022   INFLUENZA VACCINE  02/21/2022   HEMOGLOBIN A1C  07/13/2022   TETANUS/TDAP  06/10/2031   COLONOSCOPY (Pts 45-33yr Insurance coverage will need to be confirmed)  11/10/2031   Hepatitis C Screening  Completed   HIV Screening  Completed   HPV VACCINES  Aged Out    Health Maintenance  Health Maintenance Due  Topic Date Due   COVID-19 Vaccine (1) Never done   Zoster Vaccines- Shingrix (1 of 2) Never done   OPHTHALMOLOGY EXAM  02/12/2021    FOOT EXAM  02/03/2022   INFLUENZA VACCINE  02/21/2022    Colorectal cancer screening: Type of screening: Colonoscopy. Completed [id number] Repeat every 2 years  Lung Cancer Screening: (Low Dose CT Chest recommended if Age 63-80years, 30 pack-year currently smoking OR have quit w/in 15years.) does not qualify.   Lung Cancer Screening Referral: no  Additional Screening:  Hepatitis C Screening: does qualify; Completed yes  Vision Screening: Recommended annual ophthalmology exams for early detection of glaucoma and other disorders of the eye. Is the patient up to date with their annual eye exam?  No  Who is the provider or what is the name of the office in which the patient attends annual eye exams? [facility] If pt is not established with a provider, would they like to be referred to a provider to establish care?  established .   Dental Screening: Recommended annual dental exams for proper oral hygiene  Community Resource Referral / Chronic Care Management: CRR required this visit?  No   CCM required this visit?  No      Plan:     I have personally reviewed and noted the following in the patient's chart:   Medical  and social history Use of alcohol, tobacco or illicit drugs  Current medications and supplements including opioid prescriptions. Patient is not currently taking opioid prescriptions. Functional ability and status Nutritional status Physical activity Advanced directives List of other physicians Hospitalizations, surgeries, and ER visits in previous 12 months Vitals Screenings to include cognitive, depression, and falls Referrals and appointments  In addition, I have reviewed and discussed with patient certain preventive protocols, quality metrics, and best practice recommendations. A written personalized care plan for preventive services as well as general preventive health recommendations were provided to patient.     Octavio Manns   03/03/2022   Nurse  Notes:   Mr. Wordell , Thank you for taking time to come for your Medicare Wellness Visit. I appreciate your ongoing commitment to your health goals. Please review the following plan we discussed and let me know if I can assist you in the future.   These are the goals we discussed:  Goals   None     This is a list of the screening recommended for you and due dates:  Health Maintenance  Topic Date Due   COVID-19 Vaccine (1) Never done   Zoster (Shingles) Vaccine (1 of 2) Never done   Eye exam for diabetics  02/12/2021   Complete foot exam   02/03/2022   Flu Shot  02/21/2022   Hemoglobin A1C  07/13/2022   Tetanus Vaccine  06/10/2031   Colon Cancer Screening  11/10/2031   Hepatitis C Screening: USPSTF Recommendation to screen - Ages 18-79 yo.  Completed   HIV Screening  Completed   HPV Vaccine  Aged Out

## 2022-03-03 NOTE — Patient Instructions (Signed)
Health Maintenance, Male Adopting a healthy lifestyle and getting preventive care are important in promoting health and wellness. Ask your health care provider about: The right schedule for you to have regular tests and exams. Things you can do on your own to prevent diseases and keep yourself healthy. What should I know about diet, weight, and exercise? Eat a healthy diet  Eat a diet that includes plenty of vegetables, fruits, low-fat dairy products, and lean protein. Do not eat a lot of foods that are high in solid fats, added sugars, or sodium. Maintain a healthy weight Body mass index (BMI) is a measurement that can be used to identify possible weight problems. It estimates body fat based on height and weight. Your health care provider can help determine your BMI and help you achieve or maintain a healthy weight. Get regular exercise Get regular exercise. This is one of the most important things you can do for your health. Most adults should: Exercise for at least 150 minutes each week. The exercise should increase your heart rate and make you sweat (moderate-intensity exercise). Do strengthening exercises at least twice a week. This is in addition to the moderate-intensity exercise. Spend less time sitting. Even light physical activity can be beneficial. Watch cholesterol and blood lipids Have your blood tested for lipids and cholesterol at 63 years of age, then have this test every 5 years. You may need to have your cholesterol levels checked more often if: Your lipid or cholesterol levels are high. You are older than 63 years of age. You are at high risk for heart disease. What should I know about cancer screening? Many types of cancers can be detected early and may often be prevented. Depending on your health history and family history, you may need to have cancer screening at various ages. This may include screening for: Colorectal cancer. Prostate cancer. Skin cancer. Lung  cancer. What should I know about heart disease, diabetes, and high blood pressure? Blood pressure and heart disease High blood pressure causes heart disease and increases the risk of stroke. This is more likely to develop in people who have high blood pressure readings or are overweight. Talk with your health care provider about your target blood pressure readings. Have your blood pressure checked: Every 3-5 years if you are 18-39 years of age. Every year if you are 40 years old or older. If you are between the ages of 65 and 75 and are a current or former smoker, ask your health care provider if you should have a one-time screening for abdominal aortic aneurysm (AAA). Diabetes Have regular diabetes screenings. This checks your fasting blood sugar level. Have the screening done: Once every three years after age 45 if you are at a normal weight and have a low risk for diabetes. More often and at a younger age if you are overweight or have a high risk for diabetes. What should I know about preventing infection? Hepatitis B If you have a higher risk for hepatitis B, you should be screened for this virus. Talk with your health care provider to find out if you are at risk for hepatitis B infection. Hepatitis C Blood testing is recommended for: Everyone born from 1945 through 1965. Anyone with known risk factors for hepatitis C. Sexually transmitted infections (STIs) You should be screened each year for STIs, including gonorrhea and chlamydia, if: You are sexually active and are younger than 63 years of age. You are older than 63 years of age and your   health care provider tells you that you are at risk for this type of infection. Your sexual activity has changed since you were last screened, and you are at increased risk for chlamydia or gonorrhea. Ask your health care provider if you are at risk. Ask your health care provider about whether you are at high risk for HIV. Your health care provider  may recommend a prescription medicine to help prevent HIV infection. If you choose to take medicine to prevent HIV, you should first get tested for HIV. You should then be tested every 3 months for as long as you are taking the medicine. Follow these instructions at home: Alcohol use Do not drink alcohol if your health care provider tells you not to drink. If you drink alcohol: Limit how much you have to 0-2 drinks a day. Know how much alcohol is in your drink. In the [place] one drink equals one 12 oz bottle of beer (355 mL), one 5 oz glass of wine (148 mL), or one 1 oz glass of hard liquor (44 mL). Lifestyle Do not use any products that contain nicotine or tobacco. These products include cigarettes, chewing tobacco, and vaping devices, such as e-cigarettes. If you need help quitting, ask your health care provider. Do not use street drugs. Do not share needles. Ask your health care provider for help if you need support or information about quitting drugs. General instructions Schedule regular health, dental, and eye exams. Stay current with your vaccines. Tell your health care provider if: You often feel depressed. You have ever been abused or do not feel safe at home. Summary Adopting a healthy lifestyle and getting preventive care are important in promoting health and wellness. Follow your health care provider's instructions about healthy diet, exercising, and getting tested or screened for diseases. Follow your health care provider's instructions on monitoring your cholesterol and blood pressure. This information is not intended to replace advice given to you by your health care provider. Make sure you discuss any questions you have with your health care provider. Document Revised: 11/29/2020 Document Reviewed: 11/29/2020 Elsevier Patient Education  2023 [employer] Inc.  

## 2022-03-23 ENCOUNTER — Other Ambulatory Visit: Payer: Self-pay

## 2022-03-23 ENCOUNTER — Encounter (HOSPITAL_COMMUNITY): Payer: Self-pay

## 2022-03-23 ENCOUNTER — Emergency Department (HOSPITAL_COMMUNITY)
Admission: EM | Admit: 2022-03-23 | Discharge: 2022-03-23 | Disposition: A | Discharge status: Home / Self Care | Payer: Medicare Other | Attending: Emergency Medicine | Admitting: Emergency Medicine

## 2022-03-23 DIAGNOSIS — D696 Thrombocytopenia, unspecified: Secondary | ICD-10-CM | POA: Insufficient documentation

## 2022-03-23 DIAGNOSIS — Z794 Long term (current) use of insulin: Secondary | ICD-10-CM | POA: Diagnosis not present

## 2022-03-23 DIAGNOSIS — E1165 Type 2 diabetes mellitus with hyperglycemia: Secondary | ICD-10-CM | POA: Diagnosis not present

## 2022-03-23 DIAGNOSIS — J340 Abscess, furuncle and carbuncle of nose: Secondary | ICD-10-CM | POA: Diagnosis present

## 2022-03-23 DIAGNOSIS — Z7982 Long term (current) use of aspirin: Secondary | ICD-10-CM | POA: Diagnosis not present

## 2022-03-23 DIAGNOSIS — Z7984 Long term (current) use of oral hypoglycemic drugs: Secondary | ICD-10-CM | POA: Diagnosis not present

## 2022-03-23 LAB — CBC
HCT: 42.2 % (ref 39.0–52.0)
Hemoglobin: 13.7 g/dL (ref 13.0–17.0)
MCH: 28.1 pg (ref 26.0–34.0)
MCHC: 32.5 g/dL (ref 30.0–36.0)
MCV: 86.5 fL (ref 80.0–100.0)
Platelets: 122 10*3/uL — ABNORMAL LOW (ref 150–400)
RBC: 4.88 MIL/uL (ref 4.22–5.81)
RDW: 13.1 % (ref 11.5–15.5)
WBC: 9 10*3/uL (ref 4.0–10.5)
nRBC: 0 % (ref 0.0–0.2)

## 2022-03-23 LAB — BASIC METABOLIC PANEL
Anion gap: 9 (ref 5–15)
BUN: 15 mg/dL (ref 8–23)
CO2: 23 mmol/L (ref 22–32)
Calcium: 9.2 mg/dL (ref 8.9–10.3)
Chloride: 108 mmol/L (ref 98–111)
Creatinine, Ser: 1 mg/dL (ref 0.61–1.24)
GFR, Estimated: >60 mL/min (ref >60)
Glucose, Bld: 115 mg/dL — ABNORMAL HIGH (ref 70–99)
Potassium: 4.7 mmol/L (ref 3.5–5.1)
Sodium: 140 mmol/L (ref 135–145)

## 2022-03-23 MED ORDER — CLINDAMYCIN HCL 150 MG PO CAPS
150.0000 mg | ORAL_CAPSULE | Freq: Four times a day (QID) | ORAL | 0 refills | Status: DC
  Filled 2022-03-23: qty 40, 10d supply, fill #0

## 2022-03-23 MED ORDER — LIDOCAINE-EPINEPHRINE (PF) 2 %-1:200000 IJ SOLN
10.0000 mL | Freq: Once | INTRAMUSCULAR | Status: AC
  Administered 2022-03-23: 10 mL
  Filled 2022-03-23: qty 20

## 2022-03-23 NOTE — ED Triage Notes (Signed)
Reports having an ingrown hair in nose and unable to get the hair out and now having left eye puffiness.  Patient concerned for infection.  Also complains of sinus pain and needing dental work.

## 2022-03-23 NOTE — ED Provider Notes (Signed)
[facility] EMERGENCY DEPARTMENT Provider Note   CSN: [id number] Arrival date & time: 03/23/22  1216     History  Chief Complaint  Patient presents with   Facial Swelling    Spencer Robles is a 61 y.o. male.  HPI 61 year old male history of diabetes presents today complaining of nose swelling.  He states he feels he gets ingrown hairs in the nose.  This area has been irritated over the past week and he has had ongoing swelling.  He is noted there is some lump there.  Concern for infection.  He has attempted to get it to drain.  He has not had fever or chills.  Home Medications Prior to Admission medications   Medication Sig Start Date End Date Taking? Authorizing Provider  clindamycin (CLEOCIN) 150 MG capsule Take 1 capsule (150 mg total) by mouth every 6 (six) hours. 03/23/22  Yes Pattricia Boss, MD  aspirin EC 81 MG tablet Take 1 tablet (81 mg total) by mouth daily. 08/28/19   Florencia Reasons, MD  atorvastatin (LIPITOR) 20 MG tablet Take 1 tablet (20 mg total) by mouth daily. 01/18/22   Kerin Perna, NP  Blood Glucose Monitoring Suppl (TRUE METRIX METER) w/Device KIT Use to check blood sugar up to 3 times daily. 08/28/19   Florencia Reasons, MD  Dulaglutide (TRULICITY) 1.5 OM/7.6HM SOPN Inject 1.5 mg into the skin once a week. 01/11/22   Kerin Perna, NP  gabapentin (NEURONTIN) 300 MG capsule TAKE 2 CAPSULES (600 MG TOTAL) BY MOUTH 3 (THREE) TIMES DAILY. 01/11/22 01/11/23  Kerin Perna, NP  glucose blood (TRUE METRIX BLOOD GLUCOSE TEST) test strip Use as instructed to check blood sugar up to 3 times daily. 08/28/19   Florencia Reasons, MD  ibuprofen (ADVIL) 800 MG tablet Take 800 mg by mouth every 6 (six) hours as needed for mild pain.     [provider]  insulin glargine (LANTUS) 100 UNIT/ML injection Inject 0.15 mLs (15 Units total) into the skin daily. 01/11/22   Kerin Perna, NP  Insulin Pen Needle (TECHLITE PEN NEEDLES) 31G X 8 MM MISC Use as directed.  01/05/22   Kerin Perna, NP  lisinopril (ZESTRIL) 20 MG tablet TAKE 1 TABLET (20 MG TOTAL) BY MOUTH DAILY. 02/20/22 02/20/23  Charlott Rakes, MD  metFORMIN (GLUCOPHAGE-XR) 500 MG 24 hr tablet Take 1 tablet (500 mg total) by mouth daily with breakfast. 01/11/22   Kerin Perna, NP  polyethylene glycol-electrolytes (NULYTELY) 420 g solution use as directed 08/10/21   Otis Brace, MD  TRUEplus Lancets 28G MISC Use as instructed to check blood sugar up to 3 times daily. 08/28/19   Florencia Reasons, MD  sitaGLIPtin (JANUVIA) 100 MG tablet Take 1 tablet (100 mg total) by mouth daily. 01/13/20 10/20/20  Kerin Perna, NP      Allergies    Patient has no known allergies.    Review of Systems   Review of Systems  Physical Exam Updated Vital Signs BP (!) 143/93 (BP Location: Right Arm)   Pulse 66   Temp (!) 97.5 F (36.4 C) (Oral)   Resp 16   Ht 1.803 m (5' 11" )   Wt 124.7 kg   SpO2 98%   BMI 38.35 kg/m  Physical Exam Vitals and nursing note reviewed.  Constitutional:      General: He is not in acute distress.    Appearance: Normal appearance.  HENT:     Head: Normocephalic.  Right Ear: External ear normal.     Left Ear: External ear normal.     Nose:     Comments: Mild swelling left lateral nares No overlying facial erythema or warmth noted    Mouth/Throat:     Mouth: Mucous membranes are moist.     Pharynx: Oropharynx is clear.  Eyes:     Pupils: Pupils are equal, round, and reactive to light.  Cardiovascular:     Rate and Rhythm: Normal rate.  Pulmonary:     Effort: Pulmonary effort is normal.  Abdominal:     General: Abdomen is flat.  Musculoskeletal:        General: Normal range of motion.     Cervical back: Normal range of motion.  Skin:    General: Skin is warm.     Capillary Refill: Capillary refill takes less than 2 seconds.  Neurological:     General: No focal deficit present.     Mental Status: He is alert.  Psychiatric:        Mood and Affect:  Mood normal.     ED Results / Procedures / Treatments   Labs (all labs ordered are listed, but only abnormal results are displayed) Labs Reviewed  CBC - Abnormal; Notable for the following components:      Result Value   Platelets 122 (*)    All other components within normal limits  BASIC METABOLIC PANEL - Abnormal; Notable for the following components:   Glucose, Bld 115 (*)    All other components within normal limits    EKG EKG Interpretation  Date/Time:  Thursday March 23 2022 13:35:44 EDT Ventricular Rate:  65 PR Interval:  173 QRS Duration: 115 QT Interval:  435 QTC Calculation: 486 R Axis:   -32 Text Interpretation: Sinus rhythm Supraventricular bigeminy Sinus pause Nonspecific intraventricular conduction delay Confirmed by Pattricia Boss [phone]) on 03/23/2022 2:42:08 PM  Radiology No results found.  Procedures .Marland KitchenIncision and Drainage  Date/Time: 03/23/2022 2:39 PM  Performed by: Pattricia Boss, MD Authorized by: Pattricia Boss, MD   Consent:    Consent obtained:  Verbal   Consent given by:  Patient   Risks, benefits, and alternatives were discussed: yes     Risks discussed:  Bleeding, incomplete drainage and pain   Alternatives discussed:  No treatment Universal protocol:    Patient identity confirmed:  Verbally with patient Location:    Type:  Abscess   Size:  2   Location:  Head   Head location:  Nose Pre-procedure details:    Skin preparation:  Chlorhexidine with alcohol Sedation:    Sedation type:  None Anesthesia:    Anesthesia method:  Local infiltration   Local anesthetic:  Lidocaine 1% w/o epi Procedure type:    Complexity:  Simple Procedure details:    Needle aspiration: no     Incision types:  Single straight   Incision depth:  Submucosal   Wound management:  Probed and deloculated   Drainage:  Bloody and purulent   Drainage amount:  Scant   Wound treatment:  Wound left open   Packing materials:  None Post-procedure details:     Procedure completion:  Tolerated     Medications Ordered in ED Medications  lidocaine-EPINEPHrine (XYLOCAINE W/EPI) 2 %-1:200000 (PF) injection 10 mL (10 mLs Infiltration Given by Other 03/23/22 1412)    ED Course/ Medical Decision Making/ A&P Clinical Course as of 03/23/22 1524  Thu Mar 23, 2022  4540 Basic metabolic panel reviewed and interpreted  as significant for mild hyperglycemia with glucose of 115 [DR]  1439 CBC reviewed and interpreted and significant for thrombocytopenia with platelets of 122,000 [DR]  1439 Thrombocytopenia anemia appears stable over the past year [DR]    Clinical Course User Index [DR] Pattricia Boss, MD                           Medical Decision Making 61 year old male history of diabetes presents today with pain and swelling the left nares Patient appears to have a localized infection I&D done Scant amount of discharge Plan antibiotics given surrounding erythema Plan follow-up with ENT  Amount and/or Complexity of Data Reviewed Labs: ordered.  Risk Prescription drug management.           Final Clinical Impression(s) / ED Diagnoses Final diagnoses:  Nasal abscess  Thrombocytopenia ([place])    Rx / DC Orders ED Discharge Orders          Ordered    clindamycin (CLEOCIN) 150 MG capsule  Every 6 hours        03/23/22 1523              Pattricia Boss, MD 03/23/22 1525

## 2022-03-23 NOTE — Discharge Instructions (Addendum)
Please call Dr. Deeann Saint office for recheck early next week.  Take all antibiotics as prescribed.  Use warm compresses to area.  Return if worsening swelling, redness, pain or fever. Please have your platelets rechecked with your doctor.  They have been low for at least a year

## 2022-03-23 NOTE — ED Provider Triage Note (Signed)
Emergency Medicine Provider Triage Evaluation Note  Spencer Robles , a 60 y.o. male  was evaluated in triage.  Pt complains of nasal swelling for several days. Believed he had an ingrown nose hair and tried to pull it. Since then he has had more swelling and pain. Had some bloody/yellow drainage from it. Woke up this morning and noticed some left sided facial swelling.   Review of Systems  Positive: As above Negative: Fevers, chills  Physical Exam  BP (!) 158/89 (BP Location: Right Arm)   Pulse 64   Temp 98.1 F (36.7 C) (Oral)   Resp 16   Ht '5\' 11"'$  (1.803 m)   Wt 124.7 kg   SpO2 96%   BMI 38.35 kg/m  Gen:   Awake, no distress   Resp:  Normal effort  MSK:   Moves extremities without difficulty  Other:  Small abscess noted in the left nare with overlying erythema, minimal left sided facial swelling  Medical Decision Making  Medically screening exam initiated at 12:55 PM.  Appropriate orders placed.  Spencer Robles was informed that the remainder of the evaluation will be completed by another provider, this initial triage assessment does not replace that evaluation, and the importance of remaining in the ED until their evaluation is complete.     Kateri Plummer, PA-C 03/23/22 1257

## 2022-04-03 ENCOUNTER — Other Ambulatory Visit: Payer: Self-pay

## 2022-04-20 ENCOUNTER — Other Ambulatory Visit: Payer: Self-pay

## 2022-05-17 ENCOUNTER — Other Ambulatory Visit: Payer: Self-pay

## 2022-06-08 ENCOUNTER — Other Ambulatory Visit (HOSPITAL_COMMUNITY): Payer: Self-pay

## 2022-06-27 ENCOUNTER — Other Ambulatory Visit (INDEPENDENT_AMBULATORY_CARE_PROVIDER_SITE_OTHER): Payer: Self-pay | Admitting: Primary Care

## 2022-06-27 ENCOUNTER — Other Ambulatory Visit: Payer: Self-pay

## 2022-06-27 DIAGNOSIS — Z760 Encounter for issue of repeat prescription: Secondary | ICD-10-CM

## 2022-06-27 MED ORDER — TRULICITY 1.5 MG/0.5ML ~~LOC~~ SOAJ
1.5000 mg | SUBCUTANEOUS | 2 refills | Status: DC
  Filled 2022-06-27: qty 2, 28d supply, fill #0
  Filled 2022-08-18 (×2): qty 2, 28d supply, fill #1
  Filled 2022-09-19: qty 2, 28d supply, fill #2

## 2022-06-27 NOTE — Telephone Encounter (Signed)
Requested Prescriptions  Pending Prescriptions Disp Refills   Dulaglutide (TRULICITY) 1.5 YI/5.0YD SOPN 2 mL 2    Sig: Inject 1.5 mg into the skin once a week.     Endocrinology:  Diabetes - GLP-1 Receptor Agonists Passed - 06/27/2022  1:04 PM      Passed - HBA1C is between 0 and 7.9 and within 180 days    Hemoglobin A1C  Date Value Ref Range Status  01/11/2022 6.2 (A) 4.0 - 5.6 % Final   HbA1c, POC (controlled diabetic range)  Date Value Ref Range Status  10/20/2020 9.7 (A) 0.0 - 7.0 % Final         Passed - Valid encounter within last 6 months    Recent Outpatient Visits           4 months ago Type 2 diabetes mellitus without complication, with long-term current use of insulin [facility])   [place], Annie Main L, RPH-CPP   4 months ago Impacted cerumen of right ear   [place], Michelle P, NP   5 months ago Type 2 diabetes mellitus without complication, with long-term current use of insulin ([place])   [place] [facility] Mire P, NP   11 months ago Type 2 diabetes mellitus without complication, with long-term current use of insulin ([place])   [place] [facility] Kerin Perna, NP   1 year ago Type 2 diabetes mellitus without complication, with long-term current use of insulin ([place])   [facility] Kerin Perna, NP       Future Appointments             In 2 weeks Oletta Lamas, Milford Cage, NP [place]

## 2022-06-28 ENCOUNTER — Other Ambulatory Visit: Payer: Self-pay

## 2022-07-13 ENCOUNTER — Ambulatory Visit (INDEPENDENT_AMBULATORY_CARE_PROVIDER_SITE_OTHER): Payer: Medicare Other | Admitting: Primary Care

## 2022-07-13 ENCOUNTER — Encounter (INDEPENDENT_AMBULATORY_CARE_PROVIDER_SITE_OTHER): Payer: Self-pay | Admitting: Primary Care

## 2022-07-13 VITALS — BP 139/87 | HR 53 | Ht 71.0 in | Wt 296.0 lb

## 2022-07-13 DIAGNOSIS — E119 Type 2 diabetes mellitus without complications: Secondary | ICD-10-CM | POA: Diagnosis not present

## 2022-07-13 DIAGNOSIS — Z794 Long term (current) use of insulin: Secondary | ICD-10-CM | POA: Diagnosis not present

## 2022-07-13 LAB — GLUCOSE, POCT (MANUAL RESULT ENTRY): POC Glucose: 101 mg/dl — AB (ref 70–99)

## 2022-07-13 LAB — POCT GLYCOSYLATED HEMOGLOBIN (HGB A1C): HbA1c, POC (controlled diabetic range): 5.6 % (ref 0.0–7.0)

## 2022-07-13 NOTE — Progress Notes (Signed)
No concerns. 

## 2022-07-18 NOTE — Progress Notes (Signed)
[place], is a 61 y.o. male  LXB:[id number]  [id number]:[id number]  DOB - [birth year]  Chief Complaint  Patient presents with   Diabetes       Subjective:   Spencer Robles is a 61 y.o. male here today for a follow up visit for the management of type 2 diabetes.  Patient denies any polyuria, polydipsia, polyphagia or vision changes.  Patient has No headache, No chest pain, No abdominal pain - No Nausea, No new weakness tingling or numbness, No Cough - shortness of breath  No problems updated.  No Known Allergies  Past Medical History:  Diagnosis Date   Diabetes mellitus without complication ([place])    Epididymitis 08/15/2017   Hx of necrotising fasciitis 06/2017   RLE/notes 08/15/2017   Scrotal swelling    left; due to Epididymitis/notes 08/15/2017   Sleep apnea    does not use a cpap    Current Outpatient Medications on File Prior to Visit  Medication Sig Dispense Refill   aspirin EC 81 MG tablet Take 1 tablet (81 mg total) by mouth daily. 90 tablet 0   Blood Glucose Monitoring Suppl (TRUE METRIX METER) w/Device KIT Use to check blood sugar up to 3 times daily. 1 kit 0   Dulaglutide (TRULICITY) 1.5 EH/2.1YY SOPN Inject 1.5 mg into the skin once a week. 2 mL 2   glucose blood (TRUE METRIX BLOOD GLUCOSE TEST) test strip Use as instructed to check blood sugar up to 3 times daily. 100 each 11   ibuprofen (ADVIL) 800 MG tablet Take 800 mg by mouth every 6 (six) hours as needed for mild pain.      insulin glargine (LANTUS) 100 UNIT/ML Solostar Pen Inject 15 Units into the skin daily. 15 mL 3   Insulin Pen Needle (TECHLITE PEN NEEDLES) 31G X 8 MM MISC Use as directed. 100 each 33   lisinopril (ZESTRIL) 20 MG tablet TAKE 1 TABLET (20 MG TOTAL) BY MOUTH DAILY. 90 tablet 1   metFORMIN (GLUCOPHAGE-XR) 500 MG 24 hr tablet Take 1 tablet (500 mg total) by mouth daily with breakfast. 90 tablet 1   TRUEplus Lancets 28G MISC Use as instructed to check blood sugar up  to 3 times daily. 100 each 11   atorvastatin (LIPITOR) 20 MG tablet Take 1 tablet (20 mg total) by mouth daily. (Patient not taking: Reported on 07/13/2022) 90 tablet 1   clindamycin (CLEOCIN) 150 MG capsule Take 1 capsule (150 mg total) by mouth every 6 (six) hours. (Patient not taking: Reported on 07/13/2022) 40 capsule 0   gabapentin (NEURONTIN) 300 MG capsule TAKE 2 CAPSULES (600 MG TOTAL) BY MOUTH 3 (THREE) TIMES DAILY. (Patient not taking: Reported on 07/13/2022) 180 capsule 5   Insulin Glargine (BASAGLAR KWIKPEN) 100 UNIT/ML INJECT 20 UNITS INTO THE SKIN DAILY. (Patient not taking: Reported on 07/13/2022) 15 mL 2   polyethylene glycol-electrolytes (NULYTELY) 420 g solution use as directed (Patient not taking: Reported on 07/13/2022) 4000 mL 0   [DISCONTINUED] sitaGLIPtin (JANUVIA) 100 MG tablet Take 1 tablet (100 mg total) by mouth daily. 90 tablet 1   No current facility-administered medications on file prior to visit.    Objective:   Vitals:   07/13/22 1014  BP: 139/87  Pulse: (Abnormal) 53  SpO2: 98%  Weight: 296 lb (134.3 kg)  Height: _0  (1.803 m)    Exam General appearance : Awake, alert, not in any distress. Speech Clear. Not toxic looking HEENT: Atraumatic and Normocephalic, pupils equally  reactive to light and accomodation Neck: Supple, no JVD. No cervical lymphadenopathy.  Chest: Good air entry bilaterally, no added sounds  CVS: S1 S2 regular, no murmurs.  Abdomen: Bowel sounds present, Non tender and not distended with no gaurding, rigidity or rebound. Extremities: B/L Lower Ext shows no edema, both legs are warm to touch Neurology: Awake alert, and oriented X 3, CN II-XII intact, Non focal Skin: No Rash  Data Review Lab Results  Component Value Date   HGBA1C 5.6 07/13/2022   HGBA1C 6.2 (A) 01/11/2022   HGBA1C 6.6 (A) 07/13/2021    Assessment & Plan   1. Type 2 diabetes mellitus without complication, with long-term current use of insulin (HCC)  - POCT  glucose (manual entry) - POCT glycosylated hemoglobin (Hb A1C) 5.6    Patient have been counseled extensively about nutrition and exercise. Other issues discussed during this visit include: low cholesterol diet, weight control and daily exercise, foot care, annual eye examinations at Ophthalmology, importance of adherence with medications and regular follow-up. We also discussed long term complications of uncontrolled diabetes and hypertension.   Return in about 3 months (around 10/12/2022) for Fasting labs.  The patient was given clear instructions to go to ER or return to medical center if symptoms don't improve, worsen or new problems develop. The patient verbalized understanding. The patient was told to call to get lab results if they haven't heard anything in the next week.   This note has been created with Surveyor, quantity. Any transcriptional errors are unintentional.   Kerin Perna, NP 07/18/2022, 11:22 PM

## 2022-08-18 ENCOUNTER — Other Ambulatory Visit: Payer: Self-pay

## 2022-08-23 ENCOUNTER — Other Ambulatory Visit: Payer: Self-pay

## 2022-08-23 NOTE — Progress Notes (Signed)
S:     PCP: Juluis Mire  62 y.o. male who presents for diabetes evaluation, education, and management.  PMH is significant for T2DM and HTN. Patient was referred and last seen by Primary Care Provider, Juluis Mire, on 07/13/2022. A1c was controlled at that time at 5.6%.  Today, patient arrives in *** good spirits and presents without *** any assistance. ***  Patient reports diabetes was diagnosed in 2007.    Family/Social History:  - FHx: no pertinent positives listed in [facility] - Tobacco: never smoker - Alcohol: denies use  Current diabetes medications include: Trulicity 1.'5mg'$  once weekly, Basaglar 20 units once daily, metformin '500mg'$  once daily  Patient reports adherence to taking all medications as prescribed.  *** Patient denies adherence with medications, reports missing *** medications *** times per week, on average.  Insurance coverage: [place] [employer]  Patient {Actions; denies-reports:120008} hypoglycemic events.  Reported home fasting blood sugars: ***  Reported 2 hour post-meal/random blood sugars: ***.  Patient {Actions; denies-reports:120008} nocturia (nighttime urination).  Patient {Actions; denies-reports:120008} neuropathy (nerve pain). Patient {Actions; denies-reports:120008} visual changes. Patient {Actions; denies-reports:120008} self foot exams.   Patient reported dietary habits: Eats *** meals/day Breakfast: *** Lunch: *** Dinner: *** Snacks: *** Drinks: ***   Patient-reported exercise habits: ***   O:   ROS  Physical Exam  7 day average blood glucose: ***  *** CGM Download:  % Time CGM is active: ***% Average Glucose: *** mg/dL Glucose Management Indicator: ***  Glucose Variability: *** (goal <36%) Time in Goal:  - Time in range 70-180: ***% - Time above range: ***% - Time below range: ***% Observed patterns:   Lab Results  Component Value Date   HGBA1C 5.6 07/13/2022   There were no vitals filed for this visit.  Lipid  Panel     Component Value Date/Time   CHOL 171 01/11/2022 1125   TRIG 95 01/11/2022 1125   HDL 48 01/11/2022 1125   CHOLHDL 3.6 01/11/2022 1125   CHOLHDL 3.0 04/15/2015 1219   VLDL 13 04/15/2015 1219   LDLCALC 106 (H) 01/11/2022 1125    Clinical Atherosclerotic Cardiovascular Disease (ASCVD): {YES/NO:21197} The 10-year ASCVD risk score (Arnett DK, et al., 2019) is: 29.5%   Values used to calculate the score:     Age: 62 years     Sex: Male     Is Non-Hispanic African American: Yes     Diabetic: Yes     Tobacco smoker: No     Systolic Blood Pressure: 967 mmHg     Is BP treated: Yes     HDL Cholesterol: 48 mg/dL     Total Cholesterol: 171 mg/dL   Patient is participating in a Managed Medicaid Plan:  {MM YES/NO:27447::"Yes"}   A/P: Diabetes longstanding *** currently ***. Patient is *** able to verbalize appropriate hypoglycemia management plan. Medication adherence appears ***. Control is suboptimal due to ***. -{Meds adjust:18428} basal insulin *** (insulin ***). Patient will continue to titrate 1 unit every *** days if fasting blood sugar > '100mg'$ /dl until fasting blood sugars reach goal or next visit.  -{Meds adjust:18428} rapid insulin *** (insulin ***) to ***.  -{Meds adjust:18428} GLP-1 *** (generic ***) to ***.  -{Meds adjust:18428} SGLT2-I *** (generic ***) to ***. Counseled on sick day rules. -{Meds adjust:18428} metformin *** to ***.  -Patient educated on purpose, proper use, and potential adverse effects of ***.  -Extensively discussed pathophysiology of diabetes, recommended lifestyle interventions, dietary effects on blood sugar control.  -Counseled on s/sx of  and management of hypoglycemia.  -Next A1c anticipated ***.   ASCVD risk - primary ***secondary prevention in patient with diabetes. Last LDL is *** not at goal of <70 *** mg/dL. ASCVD risk factors include *** and 10-year ASCVD risk score of ***. {Desc; low/moderate/high:110033} intensity statin indicated.   -{Meds adjust:18428} ***statin *** mg.   Hypertension longstanding *** currently ***. Blood pressure goal of <130/80 *** mmHg. Medication adherence ***. Blood pressure control is suboptimal due to ***. -***  Written patient instructions provided. Patient verbalized understanding of treatment plan.  Total time in face to face counseling *** minutes.    Follow-up:  Pharmacist ***. PCP clinic visit in 10/13/2022  Maryan Puls, PharmD PGY-1 [facility] Resident

## 2022-08-24 ENCOUNTER — Other Ambulatory Visit: Payer: Self-pay | Admitting: Pharmacist

## 2022-08-24 ENCOUNTER — Other Ambulatory Visit: Payer: Self-pay

## 2022-08-24 ENCOUNTER — Ambulatory Visit: Payer: Medicare Other | Attending: Nurse Practitioner | Admitting: Pharmacist

## 2022-08-24 DIAGNOSIS — Z7984 Long term (current) use of oral hypoglycemic drugs: Secondary | ICD-10-CM | POA: Insufficient documentation

## 2022-08-24 DIAGNOSIS — E119 Type 2 diabetes mellitus without complications: Secondary | ICD-10-CM | POA: Insufficient documentation

## 2022-08-24 DIAGNOSIS — Z760 Encounter for issue of repeat prescription: Secondary | ICD-10-CM | POA: Diagnosis not present

## 2022-08-24 DIAGNOSIS — Z713 Dietary counseling and surveillance: Secondary | ICD-10-CM | POA: Insufficient documentation

## 2022-08-24 DIAGNOSIS — I10 Essential (primary) hypertension: Secondary | ICD-10-CM | POA: Diagnosis not present

## 2022-08-24 DIAGNOSIS — Z7985 Long-term (current) use of injectable non-insulin antidiabetic drugs: Secondary | ICD-10-CM | POA: Diagnosis not present

## 2022-08-24 DIAGNOSIS — Z79899 Other long term (current) drug therapy: Secondary | ICD-10-CM | POA: Insufficient documentation

## 2022-08-24 DIAGNOSIS — Z794 Long term (current) use of insulin: Secondary | ICD-10-CM

## 2022-08-24 DIAGNOSIS — Z89419 Acquired absence of unspecified great toe: Secondary | ICD-10-CM | POA: Insufficient documentation

## 2022-08-24 MED ORDER — INSULIN GLARGINE 100 UNIT/ML SOLOSTAR PEN
12.0000 [IU] | PEN_INJECTOR | Freq: Every day | SUBCUTANEOUS | 3 refills | Status: DC
  Filled 2022-08-24 – 2022-12-22 (×2): qty 15, 125d supply, fill #0

## 2022-08-31 ENCOUNTER — Other Ambulatory Visit: Payer: Self-pay

## 2022-09-19 ENCOUNTER — Other Ambulatory Visit: Payer: Self-pay

## 2022-09-20 ENCOUNTER — Other Ambulatory Visit: Payer: Self-pay

## 2022-09-21 ENCOUNTER — Other Ambulatory Visit: Payer: Self-pay

## 2022-09-25 ENCOUNTER — Other Ambulatory Visit: Payer: Self-pay

## 2022-09-26 ENCOUNTER — Other Ambulatory Visit: Payer: Self-pay

## 2022-09-27 ENCOUNTER — Other Ambulatory Visit: Payer: Self-pay

## 2022-09-29 ENCOUNTER — Other Ambulatory Visit: Payer: Self-pay

## 2022-10-03 ENCOUNTER — Other Ambulatory Visit: Payer: Self-pay

## 2022-10-13 ENCOUNTER — Encounter (INDEPENDENT_AMBULATORY_CARE_PROVIDER_SITE_OTHER): Payer: Self-pay | Admitting: Primary Care

## 2022-10-13 ENCOUNTER — Other Ambulatory Visit: Payer: Self-pay

## 2022-10-13 ENCOUNTER — Ambulatory Visit (INDEPENDENT_AMBULATORY_CARE_PROVIDER_SITE_OTHER): Payer: Medicare Other | Admitting: Primary Care

## 2022-10-13 VITALS — BP 140/88 | HR 52 | Resp 16 | Ht 71.0 in | Wt 275.8 lb

## 2022-10-13 DIAGNOSIS — Z794 Long term (current) use of insulin: Secondary | ICD-10-CM

## 2022-10-13 DIAGNOSIS — Z760 Encounter for issue of repeat prescription: Secondary | ICD-10-CM | POA: Diagnosis not present

## 2022-10-13 DIAGNOSIS — I10 Essential (primary) hypertension: Secondary | ICD-10-CM | POA: Diagnosis not present

## 2022-10-13 DIAGNOSIS — E782 Mixed hyperlipidemia: Secondary | ICD-10-CM

## 2022-10-13 DIAGNOSIS — E119 Type 2 diabetes mellitus without complications: Secondary | ICD-10-CM

## 2022-10-13 MED ORDER — TRULICITY 1.5 MG/0.5ML ~~LOC~~ SOAJ
1.5000 mg | SUBCUTANEOUS | 2 refills | Status: DC
  Filled 2022-10-13 – 2022-10-30 (×2): qty 2, 28d supply, fill #0
  Filled 2022-12-22: qty 2, 28d supply, fill #1
  Filled 2023-01-19: qty 2, 28d supply, fill #2

## 2022-10-13 NOTE — Progress Notes (Signed)
[place], is a 62 y.o. male  [id number]  [id number]  DOB - 09-09-1960  Chief Complaint  Patient presents with   Diabetes   Hypertension       Subjective:   Spencer Robles is a 62 y.o. male here today for a follow up visit for the management of HTN. Patient has No headache, No chest pain, No abdominal pain - No Nausea, No new weakness tingling or numbness, No Cough - shortness of breath. Type 2 diabetes - denies polyuria, polydipsia, polyphagia, or vision changes. Refuses a foot exam today until he has a pedicure.   No problems updated.  No Known Allergies  Past Medical History:  Diagnosis Date   Diabetes mellitus without complication ([place])    Epididymitis 08/15/2017   Hx of necrotising fasciitis 06/2017   RLE/notes 08/15/2017   Scrotal swelling    left; due to Epididymitis/notes 08/15/2017   Sleep apnea    does not use a cpap    Current Outpatient Medications on File Prior to Visit  Medication Sig Dispense Refill   aspirin EC 81 MG tablet Take 1 tablet (81 mg total) by mouth daily. 90 tablet 0   Blood Glucose Monitoring Suppl (TRUE METRIX METER) w/Device KIT Use to check blood sugar up to 3 times daily. 1 kit 0   Dulaglutide (TRULICITY) 1.5 0000000 SOPN Inject 1.5 mg into the skin once a week. 2 mL 2   glucose blood (TRUE METRIX BLOOD GLUCOSE TEST) test strip Use as instructed to check blood sugar up to 3 times daily. 100 each 11   ibuprofen (ADVIL) 800 MG tablet Take 800 mg by mouth every 6 (six) hours as needed for mild pain.      insulin glargine (LANTUS) 100 UNIT/ML Solostar Pen Inject 12 Units into the skin daily. 15 mL 3   Insulin Pen Needle (TECHLITE PEN NEEDLES) 31G X 8 MM MISC Use as directed. 100 each 33   lisinopril (ZESTRIL) 20 MG tablet TAKE 1 TABLET (20 MG TOTAL) BY MOUTH DAILY. 90 tablet 1   metFORMIN (GLUCOPHAGE-XR) 500 MG 24 hr tablet Take 1 tablet (500 mg total) by mouth daily with breakfast. 90 tablet 1    TRUEplus Lancets 28G MISC Use as instructed to check blood sugar up to 3 times daily. 100 each 11   [DISCONTINUED] sitaGLIPtin (JANUVIA) 100 MG tablet Take 1 tablet (100 mg total) by mouth daily. 90 tablet 1   No current facility-administered medications on file prior to visit.    Objective:   Vitals:   10/13/22 0914 10/13/22 0917 10/13/22 0949  BP: (Abnormal) 172/124 (Abnormal) 166/92 (Abnormal) 140/88  Pulse: (Abnormal) 52    Resp: 16    SpO2: 96%    Weight: 275 lb 12.8 oz (125.1 kg)    Height: 5\' 11"  (1.803 m)      Comprehensive ROS Pertinent positive and negative noted in HPI   Exam General appearance : Awake, alert, not in any distress. Speech Clear. Not toxic looking HEENT: Atraumatic and Normocephalic, pupils equally reactive to light and accomodation Neck: Supple, no JVD. No cervical lymphadenopathy.  Chest: Good air entry bilaterally, no added sounds  CVS: S1 S2 regular, no murmurs.  Abdomen: Bowel sounds present, Non tender and not distended with no gaurding, rigidity or rebound. Extremities: B/L Lower Ext shows no edema, both legs are warm to touch Neurology: Awake alert, and oriented X 3, , Non focal Skin: No Rash  Data Review Lab Results  Component  Value Date   HGBA1C 5.6 07/13/2022   HGBA1C 6.2 (A) 01/11/2022   HGBA1C 6.6 (A) 07/13/2021    Assessment & Plan  Spencer Robles was seen today for diabetes and hypertension.  Diagnoses and all orders for this visit:  Type 2 diabetes mellitus without complication, with long-term current use of insulin ([place]) - educated on lifestyle modifications, including but not limited to diet choices and adding exercise to daily routine.   A1C  Essential hypertension BP goal - < 140/90 met Explained that having normal blood pressure is the goal and medications are helping to get to goal and maintain normal blood pressure. DIET: Limit salt intake, read nutrition labels to check salt content, limit fried and high fatty foods   Avoid using multisymptom OTC cold preparations that generally contain sudafed which can rise BP. Consult with pharmacist on best cold relief products to use for persons with HTN EXERCISE Discussed incorporating exercise such as walking - 30 minutes most days of the week and can do in 10 minute intervals    -     CMP14+EGFR  Mixed hyperlipidemia  Healthy lifestyle diet of fruits vegetables fish nuts whole grains and low saturated fat . Foods high in cholesterol or liver, fatty meats,cheese, butter avocados, nuts and seeds, chocolate and fried foods. -     Lipid panel  Medication refill -     Dulaglutide (TRULICITY) 1.5 0000000 SOPN; Inject 1.5 mg into the skin once a week.      Patient have been counseled extensively about nutrition and exercise. Other issues discussed during this visit include: low cholesterol diet, weight control and daily exercise, foot care, annual eye examinations at Ophthalmology, importance of adherence with medications and regular follow-up. We also discussed long term complications of uncontrolled diabetes and hypertension.   No follow-ups on file.  The patient was given clear instructions to go to ER or return to medical center if symptoms don't improve, worsen or new problems develop. The patient verbalized understanding. The patient was told to call to get lab results if they haven't heard anything in the next week.   This note has been created with Surveyor, quantity. Any transcriptional errors are unintentional.   Kerin Perna, NP 10/13/2022, 9:51 AM

## 2022-10-15 LAB — CBC WITH DIFFERENTIAL/PLATELET
Basophils Absolute: 0.1 10*3/uL (ref 0.0–0.2)
Basos: 1 %
EOS (ABSOLUTE): 0.3 10*3/uL (ref 0.0–0.4)
Eos: 4 %
Hematocrit: 41.8 % (ref 37.5–51.0)
Hemoglobin: 14 g/dL (ref 13.0–17.7)
Immature Grans (Abs): 0 10*3/uL (ref 0.0–0.1)
Immature Granulocytes: 0 %
Lymphocytes Absolute: 2.4 10*3/uL (ref 0.7–3.1)
Lymphs: 30 %
MCH: 27.9 pg (ref 26.6–33.0)
MCHC: 33.5 g/dL (ref 31.5–35.7)
MCV: 83 fL (ref 79–97)
Monocytes Absolute: 0.5 10*3/uL (ref 0.1–0.9)
Monocytes: 6 %
Neutrophils Absolute: 4.8 10*3/uL (ref 1.4–7.0)
Neutrophils: 59 %
Platelets: 146 10*3/uL — ABNORMAL LOW (ref 150–450)
RBC: 5.02 x10E6/uL (ref 4.14–5.80)
RDW: 13.6 % (ref 11.6–15.4)
WBC: 8.1 10*3/uL (ref 3.4–10.8)

## 2022-10-15 LAB — CMP14+EGFR
ALT: 17 IU/L (ref 0–44)
AST: 22 IU/L (ref 0–40)
Albumin/Globulin Ratio: 1.6 (ref 1.2–2.2)
Albumin: 4.6 g/dL (ref 3.9–4.9)
Alkaline Phosphatase: 119 IU/L (ref 44–121)
BUN/Creatinine Ratio: 12 (ref 10–24)
BUN: 12 mg/dL (ref 8–27)
Bilirubin Total: 0.5 mg/dL (ref 0.0–1.2)
CO2: 26 mmol/L (ref 20–29)
Calcium: 9.8 mg/dL (ref 8.6–10.2)
Chloride: 103 mmol/L (ref 96–106)
Creatinine, Ser: 1.02 mg/dL (ref 0.76–1.27)
Globulin, Total: 2.8 g/dL (ref 1.5–4.5)
Glucose: 94 mg/dL (ref 70–99)
Potassium: 4.6 mmol/L (ref 3.5–5.2)
Sodium: 142 mmol/L (ref 134–144)
Total Protein: 7.4 g/dL (ref 6.0–8.5)
eGFR: 83 mL/min/{1.73_m2} (ref >59)

## 2022-10-15 LAB — HEMOGLOBIN A1C
Est. average glucose Bld gHb Est-mCnc: 137 mg/dL
Hgb A1c MFr Bld: 6.4 % — ABNORMAL HIGH (ref 4.8–5.6)

## 2022-10-15 LAB — MICROALBUMIN / CREATININE URINE RATIO
Creatinine, Urine: 94.8 mg/dL
Microalb/Creat Ratio: 137 mg/g creat — ABNORMAL HIGH (ref 0–29)
Microalbumin, Urine: 129.9 ug/mL

## 2022-10-15 LAB — LIPID PANEL
Chol/HDL Ratio: 3.1 ratio (ref 0.0–5.0)
Cholesterol, Total: 147 mg/dL (ref 100–199)
HDL: 48 mg/dL (ref >39)
LDL Chol Calc (NIH): 85 mg/dL (ref 0–99)
Triglycerides: 71 mg/dL (ref 0–149)
VLDL Cholesterol Cal: 14 mg/dL (ref 5–40)

## 2022-10-19 ENCOUNTER — Other Ambulatory Visit: Payer: Self-pay

## 2022-10-30 ENCOUNTER — Other Ambulatory Visit: Payer: Self-pay

## 2022-10-31 ENCOUNTER — Other Ambulatory Visit: Payer: Self-pay

## 2022-11-15 ENCOUNTER — Other Ambulatory Visit: Payer: Self-pay

## 2022-11-15 ENCOUNTER — Other Ambulatory Visit: Payer: Self-pay | Admitting: Family Medicine

## 2022-11-15 ENCOUNTER — Other Ambulatory Visit (INDEPENDENT_AMBULATORY_CARE_PROVIDER_SITE_OTHER): Payer: Self-pay | Admitting: Primary Care

## 2022-11-15 DIAGNOSIS — Z760 Encounter for issue of repeat prescription: Secondary | ICD-10-CM

## 2022-11-15 DIAGNOSIS — I10 Essential (primary) hypertension: Secondary | ICD-10-CM

## 2022-11-15 DIAGNOSIS — E119 Type 2 diabetes mellitus without complications: Secondary | ICD-10-CM

## 2022-11-15 IMAGING — CR DG FOOT COMPLETE 3+V*R*
3 series · 3 of 3 positions shown · non-contrast
Comparison: 08/25/2019

CLINICAL DATA: Infection, pain, swelling

EXAM:
RIGHT FOOT COMPLETE - 3+ VIEW

[foot ap]
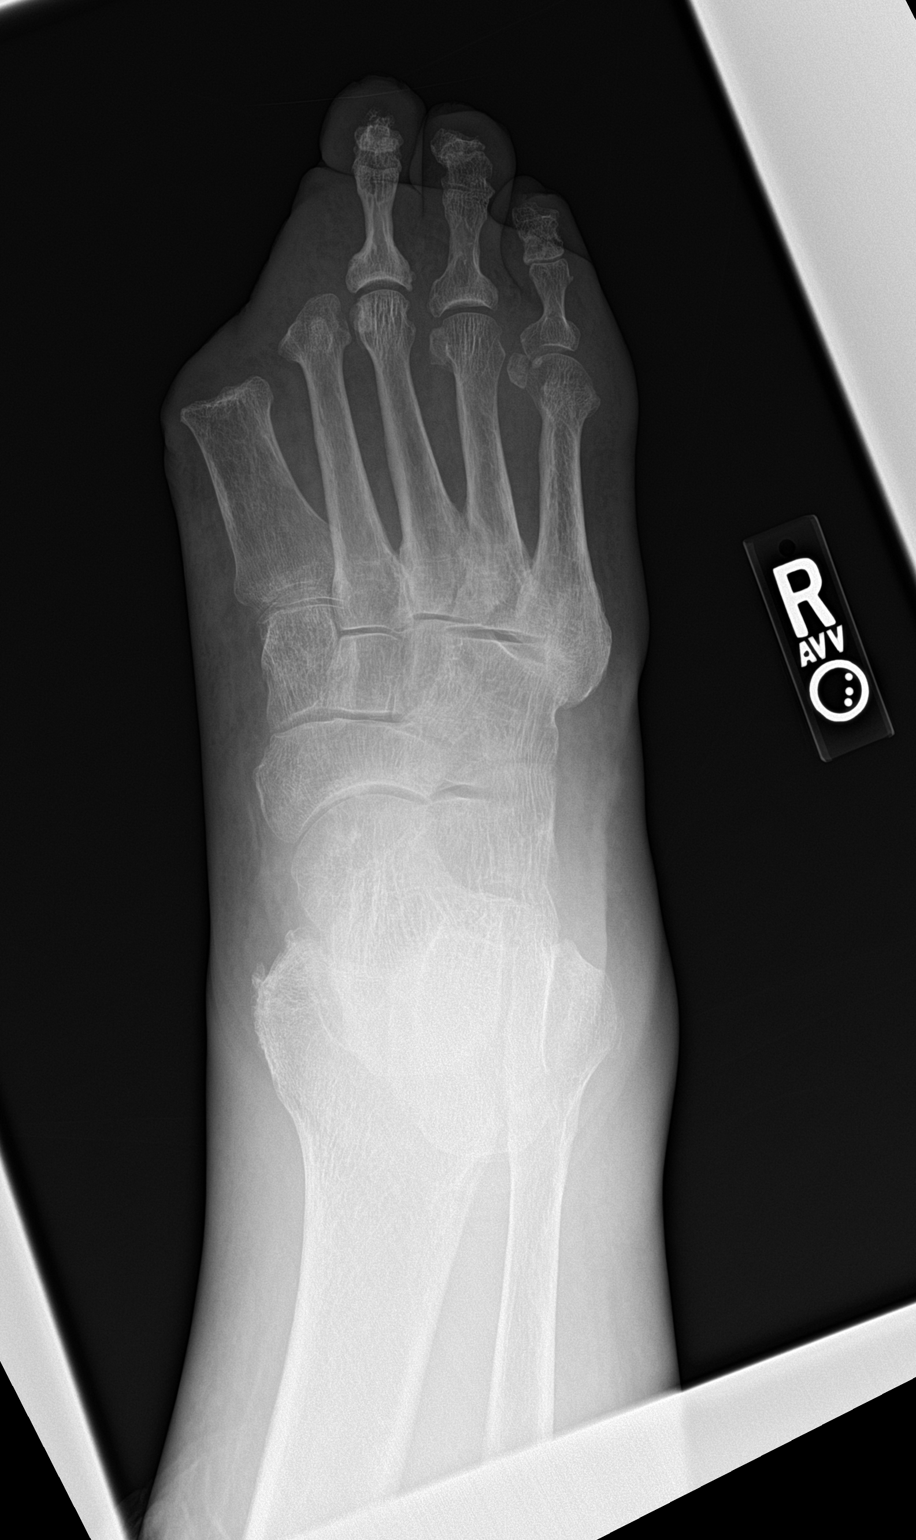

[foot obl]
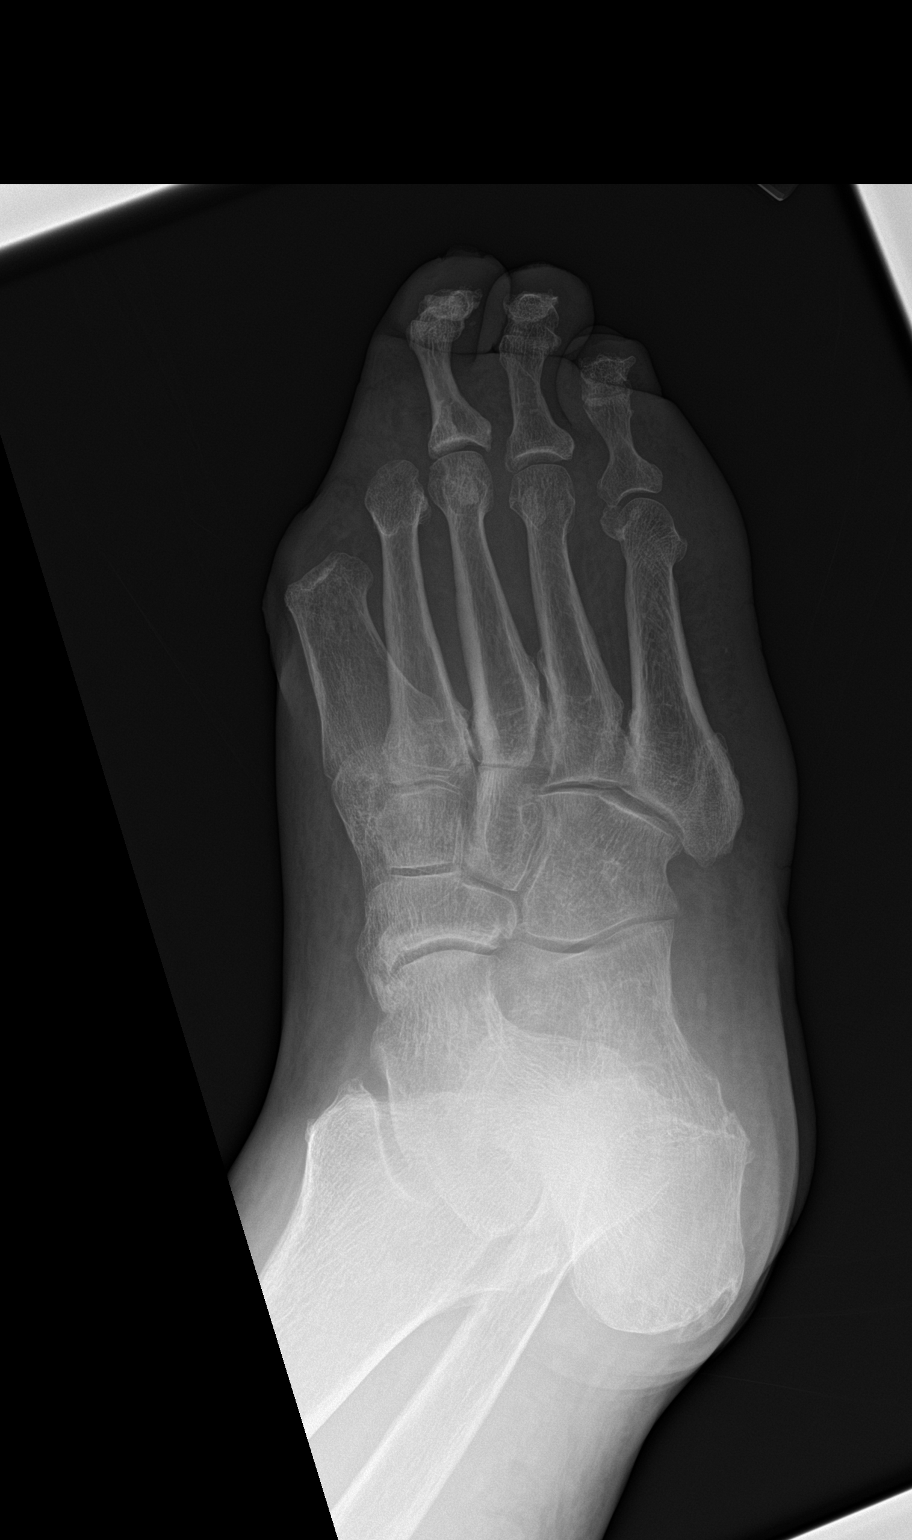

[foot lat]
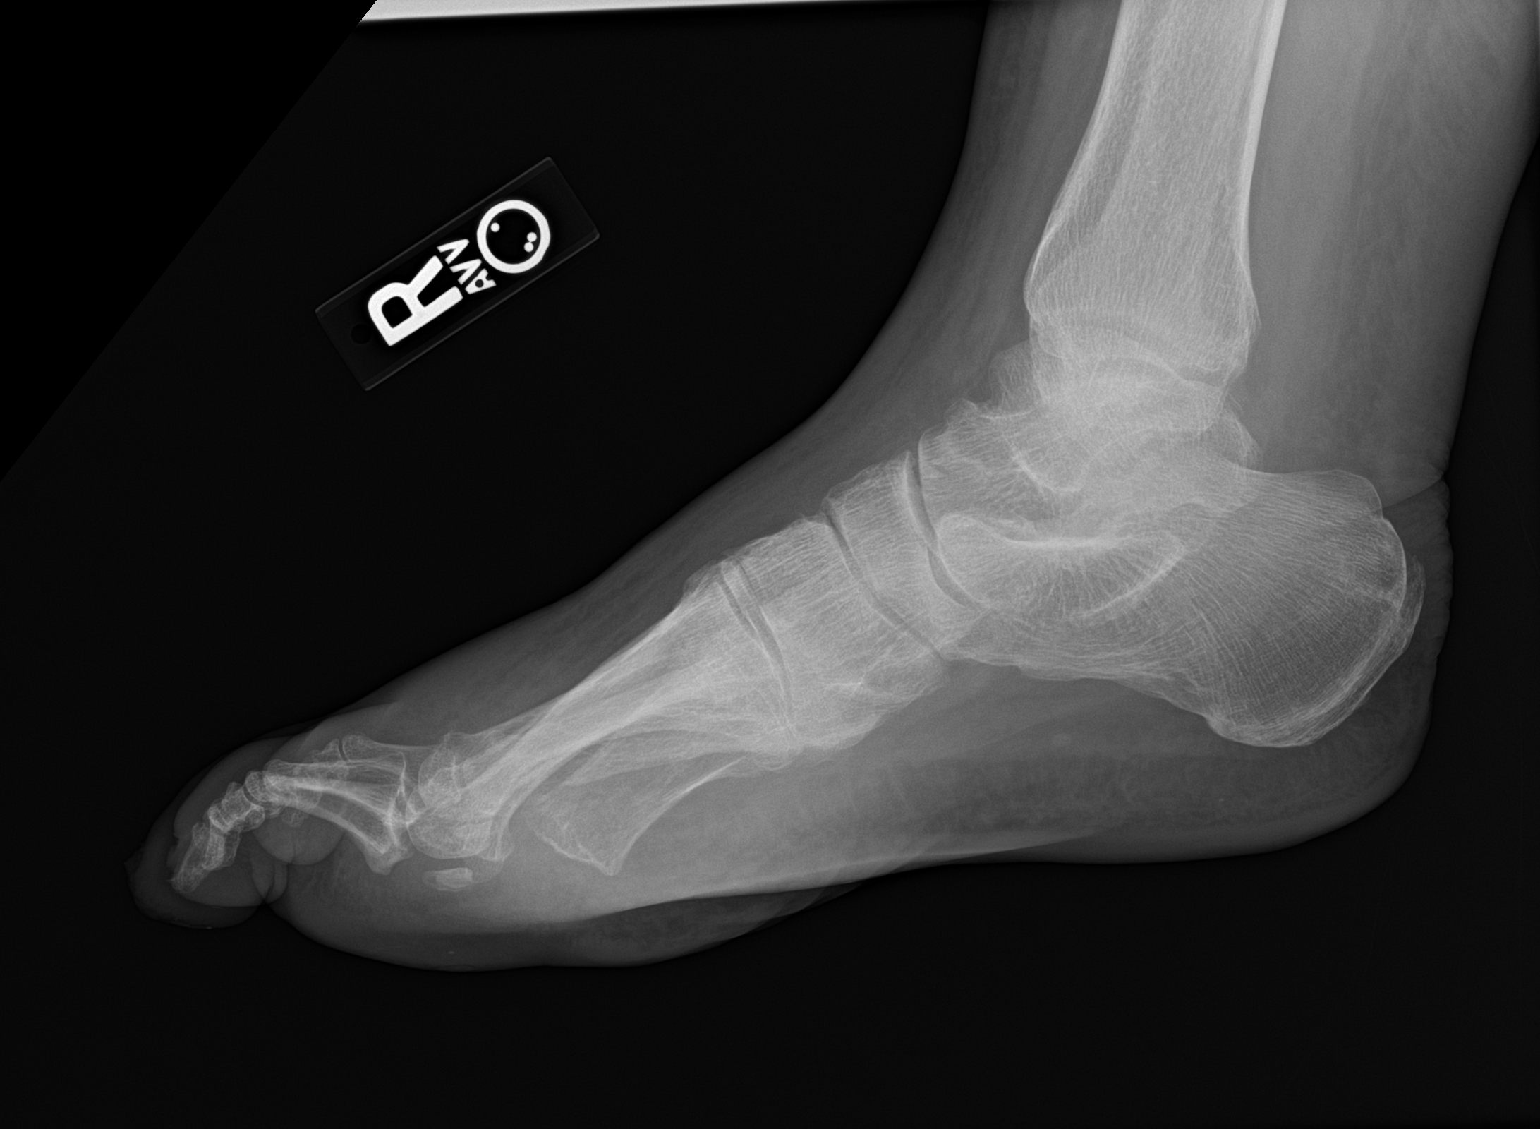

[3 of 3 positions shown; findings below may reference images not displayed]

FINDINGS: Changes of prior 1st and 2nd toe amputations, unchanged. No acute
bony abnormality. No bone destruction to suggest osteomyelitis. No
fracture, subluxation or dislocation. No soft tissue gas.
IMPRESSION: No acute bony abnormality.

## 2022-11-16 ENCOUNTER — Other Ambulatory Visit: Payer: Self-pay

## 2022-11-16 MED ORDER — LISINOPRIL 20 MG PO TABS
20.0000 mg | ORAL_TABLET | Freq: Every day | ORAL | 0 refills | Status: DC
Start: 2022-11-16 — End: 2023-03-16
  Filled 2022-11-16 – 2022-12-22 (×2): qty 90, 90d supply, fill #0

## 2022-11-16 MED ORDER — METFORMIN HCL ER 500 MG PO TB24
500.0000 mg | ORAL_TABLET | Freq: Every day | ORAL | 0 refills | Status: DC
  Filled 2022-11-16 – 2022-12-22 (×2): qty 90, 90d supply, fill #0

## 2022-11-16 NOTE — Telephone Encounter (Signed)
Requested Prescriptions  Pending Prescriptions Disp Refills   metFORMIN (GLUCOPHAGE-XR) 500 MG 24 hr tablet 90 tablet 0    Sig: Take 1 tablet (500 mg total) by mouth daily with breakfast.     Endocrinology:  Diabetes - Biguanides Failed - 11/15/2022  5:45 PM      Failed - B12 Level in normal range and within 720 days    Vitamin B-12  Date Value Ref Range Status  05/08/2018 571 232 - 1,245 pg/mL Final         Passed - Cr in normal range and within 360 days    Creat  Date Value Ref Range Status  08/17/2015 0.92 0.70 - 1.33 mg/dL Final   Creatinine, Ser  Date Value Ref Range Status  10/13/2022 1.02 0.76 - 1.27 mg/dL Final   Creatinine, Urine  Date Value Ref Range Status  08/16/2017 142.74 mg/dL Final         Passed - HBA1C is between 0 and 7.9 and within 180 days    HbA1c, POC (controlled diabetic range)  Date Value Ref Range Status  07/13/2022 5.6 0.0 - 7.0 % Final   Hgb A1c MFr Bld  Date Value Ref Range Status  10/13/2022 6.4 (H) 4.8 - 5.6 % Final    Comment:             Prediabetes: 5.7 - 6.4          Diabetes: >6.4          Glycemic control for adults with diabetes: <7.0          Passed - eGFR in normal range and within 360 days    GFR, Est African American  Date Value Ref Range Status  08/17/2015 >89 >=60 mL/min Final   GFR calc Af Amer  Date Value Ref Range Status  04/14/2020 92 >59 mL/min/1.73 Final    Comment:    **[facility] currently reports eGFR in compliance with the current**   recommendations of the [employer]. [facility] will   update reporting as new guidelines are published from the NKF-ASN   Task force.    GFR, Est Non African American  Date Value Ref Range Status  08/17/2015 >89 >=60 mL/min Final    Comment:      The estimated GFR is a calculation valid for adults (>=29 years old) that uses the CKD-EPI algorithm to adjust for age and sex. It is   not to be used for children, pregnant women, hospitalized patients,    patients on  dialysis, or with rapidly changing kidney function. According to the NKDEP, eGFR >89 is normal, 60-89 shows mild impairment, 30-59 shows moderate impairment, 15-29 shows severe impairment and <15 is ESRD.      GFR, Estimated  Date Value Ref Range Status  03/23/2022 >60 >60 mL/min Final    Comment:    (NOTE) Calculated using the CKD-EPI Creatinine Equation (2021)    eGFR  Date Value Ref Range Status  10/13/2022 83 >59 mL/min/1.73 Final         Passed - Valid encounter within last 6 months    Recent Outpatient Visits           1 month ago Type 2 diabetes mellitus without complication, with long-term current use of insulin (HCC)   [place] [facility] Grayce Sessions, NP   2 months ago Type 2 diabetes mellitus without complication, with long-term current use of insulin (HCC)   [facility]  Drucilla Chalet, RPH-CPP   4 months ago Type 2 diabetes mellitus without complication, with long-term current use of insulin (HCC)   [place] [facility] Grayce Sessions, NP   8 months ago Type 2 diabetes mellitus without complication, with long-term current use of insulin [facility])   [facility] [place], Cornelius Moras, RPH-CPP   9 months ago Impacted cerumen of right ear   [place] [facility] Grayce Sessions, NP       Future Appointments             In 1 month Edwards, Kinnie Scales, NP [place] [facility]            Passed - CBC within normal limits and completed in the last 12 months    WBC  Date Value Ref Range Status  10/13/2022 8.1 3.4 - 10.8 x10E3/uL Final  03/23/2022 9.0 4.0 - 10.5 K/uL Final   RBC  Date Value Ref Range Status  10/13/2022 5.02 4.14 - 5.80 x10E6/uL Final  03/23/2022 4.88 4.22 - 5.81 MIL/uL Final   Hemoglobin  Date Value Ref Range Status  10/13/2022 14.0 13.0 - 17.7 g/dL Final   Hematocrit   Date Value Ref Range Status  10/13/2022 41.8 37.5 - 51.0 % Final   MCHC  Date Value Ref Range Status  10/13/2022 33.5 31.5 - 35.7 g/dL Final  16/04/9603 54.0 30.0 - 36.0 g/dL Final   [facility]  Date Value Ref Range Status  10/13/2022 27.9 26.6 - 33.0 pg Final  03/23/2022 28.1 26.0 - 34.0 pg Final   MCV  Date Value Ref Range Status  10/13/2022 83 79 - 97 fL Final   No results found for: "PLTCOUNTKUC", "LABPLAT", "POCPLA" RDW  Date Value Ref Range Status  10/13/2022 13.6 11.6 - 15.4 % Final

## 2022-11-16 NOTE — Telephone Encounter (Signed)
Requested Prescriptions  Pending Prescriptions Disp Refills   lisinopril (ZESTRIL) 20 MG tablet 90 tablet 0    Sig: TAKE 1 TABLET (20 MG TOTAL) BY MOUTH DAILY.     Cardiovascular:  ACE Inhibitors Failed - 11/15/2022  5:45 PM      Failed - Last BP in normal range    BP Readings from Last 1 Encounters:  10/13/22 (!) 140/88         Passed - Cr in normal range and within 180 days    Creat  Date Value Ref Range Status  08/17/2015 0.92 0.70 - 1.33 mg/dL Final   Creatinine, Ser  Date Value Ref Range Status  10/13/2022 1.02 0.76 - 1.27 mg/dL Final   Creatinine, Urine  Date Value Ref Range Status  08/16/2017 142.74 mg/dL Final         Passed - K in normal range and within 180 days    Potassium  Date Value Ref Range Status  10/13/2022 4.6 3.5 - 5.2 mmol/L Final         Passed - Patient is not pregnant      Passed - Valid encounter within last 6 months    Recent Outpatient Visits           1 month ago Type 2 diabetes mellitus without complication, with long-term current use of insulin (HCC)   [place] [facility] Grayce Sessions, NP   2 months ago Type 2 diabetes mellitus without complication, with long-term current use of insulin (HCC)   [place] [facility] [place], Camden L, RPH-CPP   4 months ago Type 2 diabetes mellitus without complication, with long-term current use of insulin (HCC)   [place] [facility] Grayce Sessions, NP   8 months ago Type 2 diabetes mellitus without complication, with long-term current use of insulin [facility])   [place] [facility] [place], Cornelius Moras, RPH-CPP   9 months ago Impacted cerumen of right ear   [place] [facility] Grayce Sessions, NP       Future Appointments             In 1 month Randa Evens, Kinnie Scales, NP [place] [facility]

## 2022-11-22 ENCOUNTER — Other Ambulatory Visit: Payer: Self-pay

## 2022-12-18 IMAGING — CT CT RENAL STONE PROTOCOL
2 of 4 series · 17 of 46 positions shown, 19 images · non-contrast
Comparison: CT pelvis 08/26/2017

CLINICAL DATA: Right flank pain and hematuria.



[Series 3: stone study 5.0 i30f 2 · axial · 0.98mm/px · z∈[+722,+1202]mm · 14 of 106 slices shown, 16 images]
[im 5/106  soft-tissue]
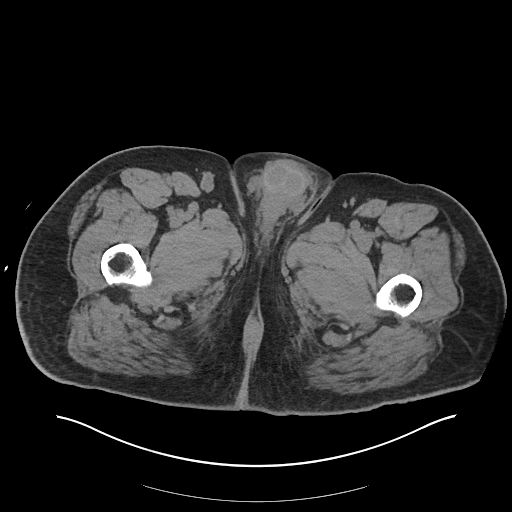
[im 5/106  bone]
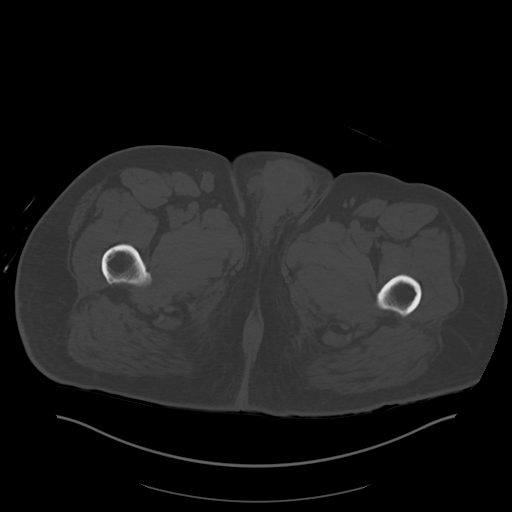
[im 13/106  soft-tissue]
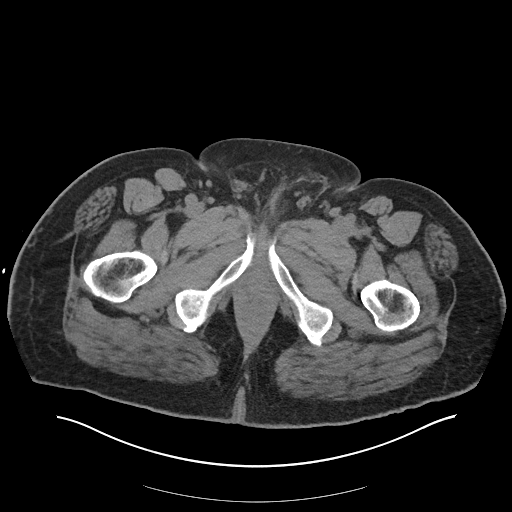
[im 22/106  soft-tissue]
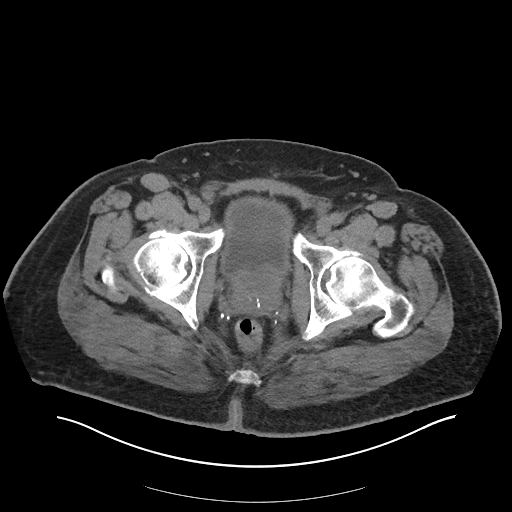
[im 30/106  soft-tissue]
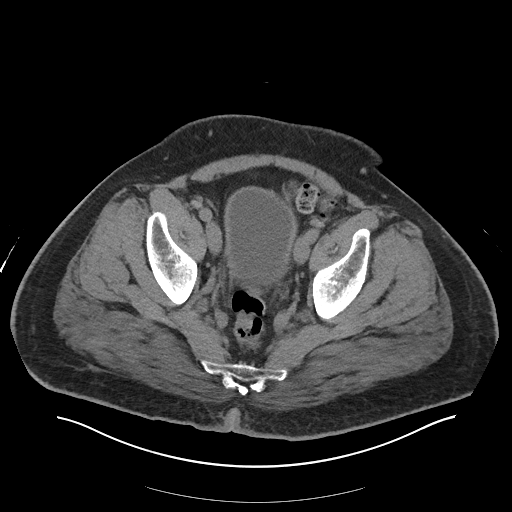
[im 34/106  soft-tissue]
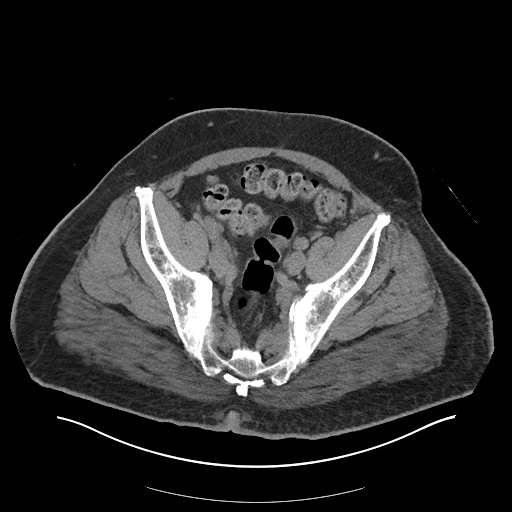
[im 43/106  soft-tissue]
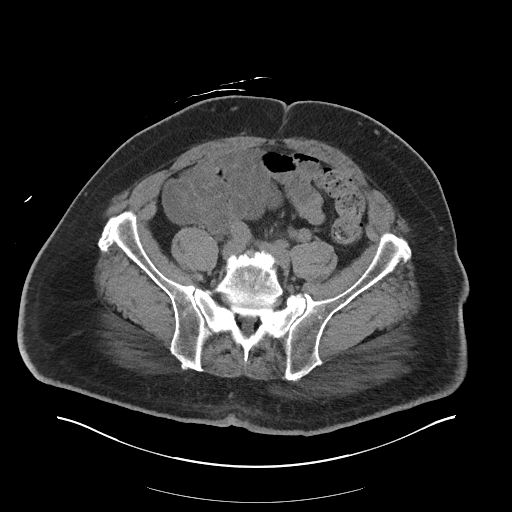
[im 51/106  soft-tissue]
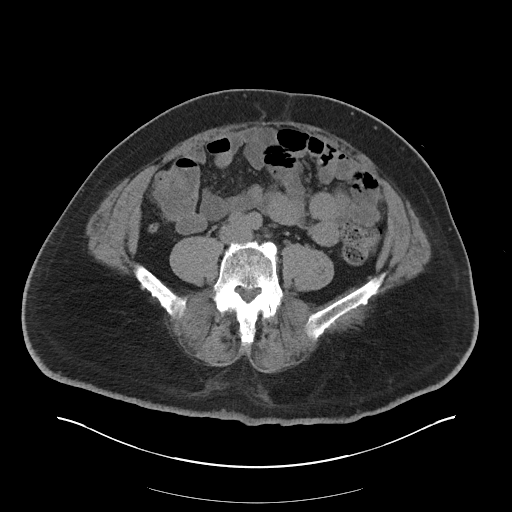
[im 55/106  soft-tissue]
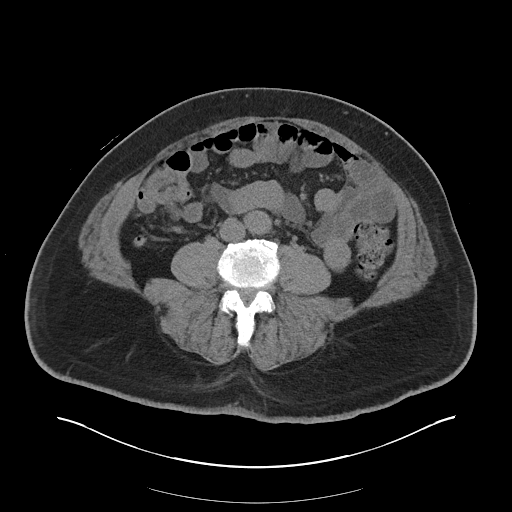
[im 64/106  soft-tissue]
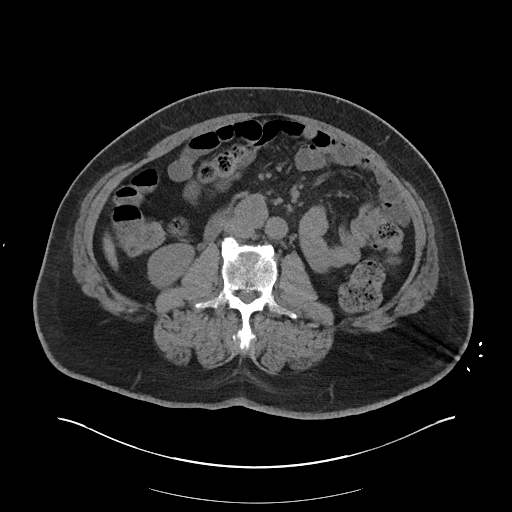
[im 64/106  bone]
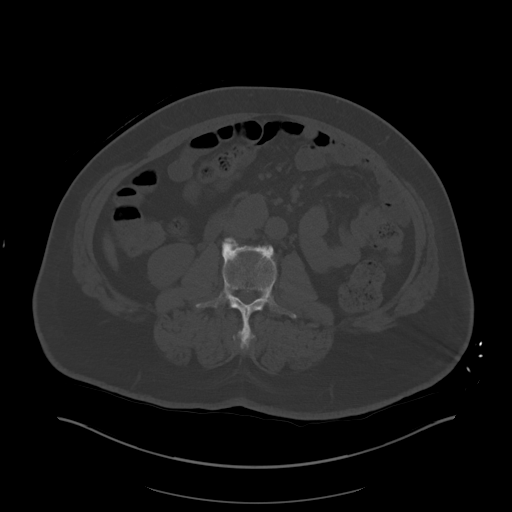
[im 72/106  soft-tissue]
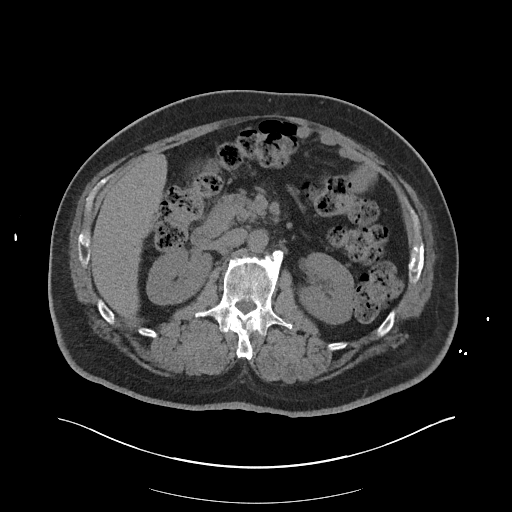
[im 80/106  soft-tissue]
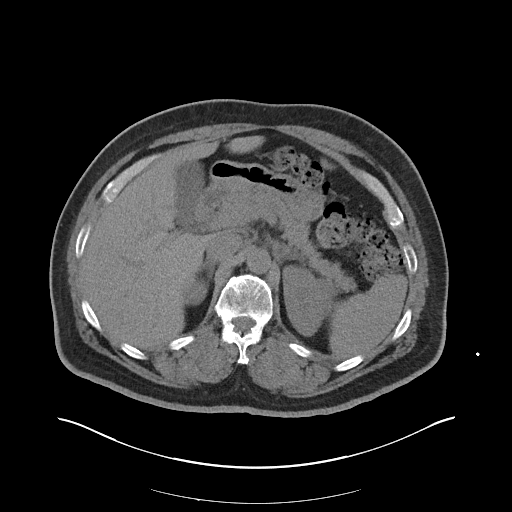
[im 85/106  soft-tissue]
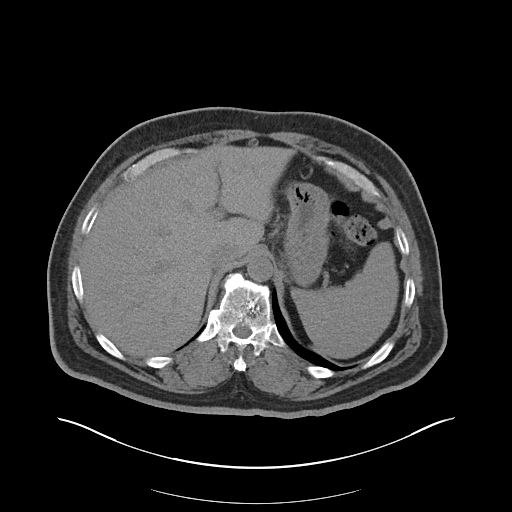
[im 93/106  soft-tissue]
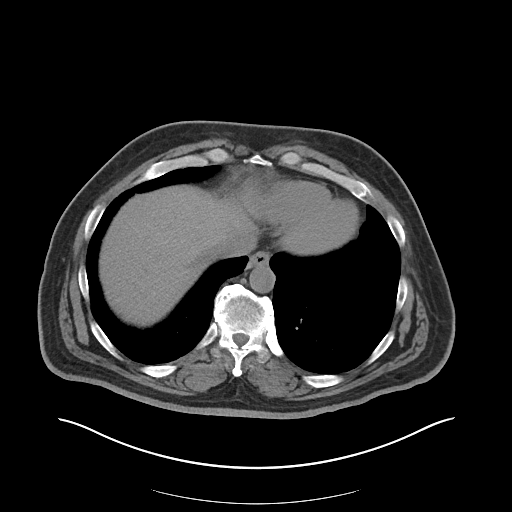
[im 101/106  soft-tissue]
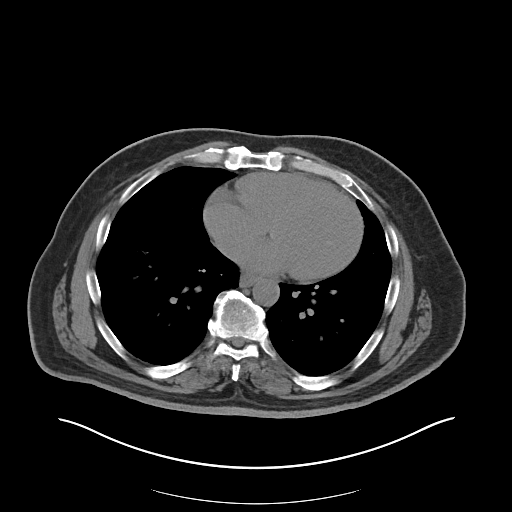

[Series 6: coronal soft tissue · coronal · 0.91mm/px · 3 of 115 slices shown]
[im 39/115  soft-tissue]
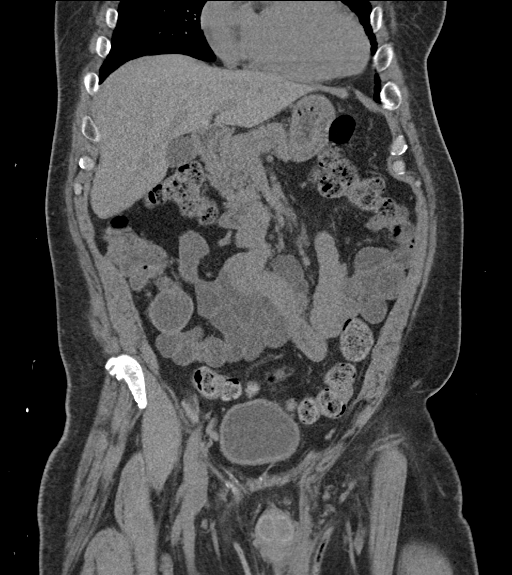
[im 51/115  soft-tissue]
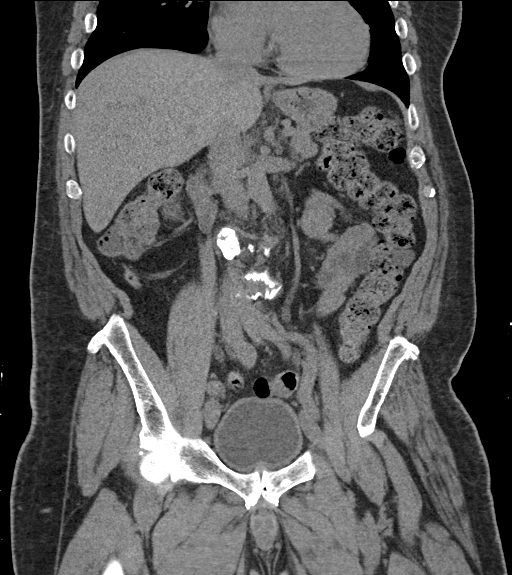
[im 64/115  soft-tissue]
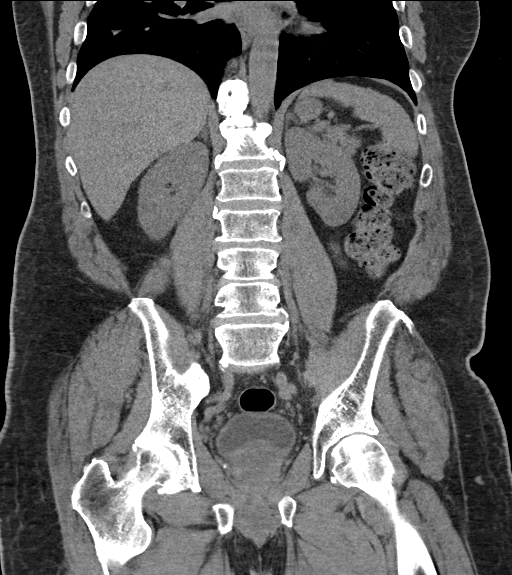

[17 of 46 positions shown; findings below may reference images not displayed]

FINDINGS: Lower chest: No acute abnormality.

Hepatobiliary: No focal liver abnormality is seen. No gallstones,
gallbladder wall thickening, or biliary dilatation.

Pancreas: Unremarkable. No pancreatic ductal dilatation or
surrounding inflammatory changes.

Spleen: Normal in size without focal abnormality.

Adrenals/Urinary Tract: Adrenal glands are unremarkable. Kidneys are
normal, without renal calculi, focal lesion, or hydronephrosis.
Bladder is unremarkable.

Stomach/Bowel: Stomach is within normal limits. Appendix appears
normal. No evidence of bowel wall thickening, distention, or
inflammatory changes.

Vascular/Lymphatic: No significant vascular findings are present. No
enlarged abdominal or pelvic lymph nodes.

Reproductive: Prostate is unremarkable.

Other: No abdominal wall hernia or abnormality. No abdominopelvic
ascites.

Musculoskeletal: Degenerative changes affect the spine and hips.
IMPRESSION: 1. No acute localizing process in the abdomen or pelvis.

## 2022-12-22 ENCOUNTER — Other Ambulatory Visit: Payer: Self-pay

## 2023-01-03 ENCOUNTER — Emergency Department (HOSPITAL_COMMUNITY)
Admission: EM | Admit: 2023-01-03 | Discharge: 2023-01-03 | Disposition: A | Discharge status: Home / Self Care | Payer: Medicare Other | Attending: Emergency Medicine | Admitting: Emergency Medicine

## 2023-01-03 ENCOUNTER — Other Ambulatory Visit: Payer: Self-pay

## 2023-01-03 ENCOUNTER — Emergency Department (HOSPITAL_COMMUNITY): Payer: Medicare Other

## 2023-01-03 DIAGNOSIS — I10 Essential (primary) hypertension: Secondary | ICD-10-CM | POA: Diagnosis not present

## 2023-01-03 DIAGNOSIS — L0291 Cutaneous abscess, unspecified: Secondary | ICD-10-CM | POA: Insufficient documentation

## 2023-01-03 DIAGNOSIS — Z794 Long term (current) use of insulin: Secondary | ICD-10-CM | POA: Insufficient documentation

## 2023-01-03 DIAGNOSIS — Z7984 Long term (current) use of oral hypoglycemic drugs: Secondary | ICD-10-CM | POA: Insufficient documentation

## 2023-01-03 DIAGNOSIS — Z7982 Long term (current) use of aspirin: Secondary | ICD-10-CM | POA: Diagnosis not present

## 2023-01-03 DIAGNOSIS — E119 Type 2 diabetes mellitus without complications: Secondary | ICD-10-CM | POA: Insufficient documentation

## 2023-01-03 DIAGNOSIS — M7989 Other specified soft tissue disorders: Secondary | ICD-10-CM | POA: Diagnosis present

## 2023-01-03 MED ORDER — DOXYCYCLINE HYCLATE 100 MG PO TABS
100.0000 mg | ORAL_TABLET | Freq: Two times a day (BID) | ORAL | 0 refills | Status: DC
  Filled 2023-01-03: qty 20, 10d supply, fill #0

## 2023-01-03 NOTE — ED Notes (Signed)
Pt aware of need for urine sample.  

## 2023-01-03 NOTE — ED Triage Notes (Signed)
Pt reports one month ago noticed small pimple-like area on R elbow that he presumed was from a spider bite. Pt popped that and then developed another wound like the first. Area now red and painful with minimal drainage.

## 2023-01-03 NOTE — ED Provider Notes (Signed)
[place] EMERGENCY DEPARTMENT AT [facility] Provider Note   CSN: [id number] Arrival date & time: 01/03/23  1352     History  Chief Complaint  Patient presents with   Wound Check    Spencer Robles is a 62 y.o. male.  Patient is a 62 year old male who presents for checkup some wounds on his right elbow.  He has a history of diabetes and hypertension.  He said he had a boil on his elbow about 2 weeks ago.  It opened up and then another 1 developed.  He wanted to get it checked out.  He denies any fevers.  No nausea or vomiting.  He had a history of a skin abscess on his buttocks once but no other skin issues.       Home Medications Prior to Admission medications   Medication Sig Start Date End Date Taking? Authorizing Provider  doxycycline (VIBRAMYCIN) 100 MG capsule Take 1 capsule (100 mg total) by mouth 2 (two) times daily. One po bid x 7 days 01/03/23  Yes Rolan Bucco, MD  aspirin EC 81 MG tablet Take 1 tablet (81 mg total) by mouth daily. 08/28/19   Albertine Grates, MD  Blood Glucose Monitoring Suppl (TRUE METRIX METER) w/Device KIT Use to check blood sugar up to 3 times daily. 08/28/19   Albertine Grates, MD  Dulaglutide (TRULICITY) 1.5 MG/0.5ML SOPN Inject 1.5 mg into the skin once a week. 10/13/22   Grayce Sessions, NP  glucose blood (TRUE METRIX BLOOD GLUCOSE TEST) test strip Use as instructed to check blood sugar up to 3 times daily. 08/28/19   Albertine Grates, MD  ibuprofen (ADVIL) 800 MG tablet Take 800 mg by mouth every 6 (six) hours as needed for mild pain.     [provider]  insulin glargine (LANTUS) 100 UNIT/ML Solostar Pen Inject 12 Units into the skin daily. 08/24/22   Hoy Register, MD  Insulin Pen Needle (TECHLITE PEN NEEDLES) 31G X 8 MM MISC Use as directed. 01/05/22   Grayce Sessions, NP  lisinopril (ZESTRIL) 20 MG tablet Take 1 tablet (20 mg total) by mouth daily. 11/16/22 11/16/23  Lorre Munroe, NP  metFORMIN (GLUCOPHAGE-XR) 500 MG 24 hr tablet Take 1  tablet (500 mg total) by mouth daily with breakfast. 11/16/22   Grayce Sessions, NP  TRUEplus Lancets 28G MISC Use as instructed to check blood sugar up to 3 times daily. 08/28/19   Albertine Grates, MD  sitaGLIPtin (JANUVIA) 100 MG tablet Take 1 tablet (100 mg total) by mouth daily. 01/13/20 10/20/20  Grayce Sessions, NP      Allergies    Patient has no known allergies.    Review of Systems   Review of Systems  Constitutional:  Negative for fever.  Gastrointestinal:  Negative for nausea and vomiting.  Musculoskeletal:  Negative for arthralgias.  Skin:  Positive for wound.  Neurological:  Negative for light-headedness.    Physical Exam Updated Vital Signs BP (!) 149/80 (BP Location: Left Arm)   Pulse (!) 57   Temp 98.5 F (36.9 C) (Oral)   Resp 18   SpO2 96%  Physical Exam Constitutional:      Appearance: He is well-developed.  HENT:     Head: Normocephalic and atraumatic.  Cardiovascular:     Rate and Rhythm: Normal rate.  Pulmonary:     Effort: Pulmonary effort is normal.  Musculoskeletal:     Cervical back: Normal range of motion and neck supple.  Comments: Small 1 cm indurated area just distal to his right elbow.  There is no evident joint involvement.  There is a small amount of underlying induration.  No fluctuance.  It is a bit scaly on top.  There is a small amount of surrounding erythema.  Skin:    General: Skin is warm and dry.  Neurological:     Mental Status: He is alert and oriented to person, place, and time.     ED Results / Procedures / Treatments   Labs (all labs ordered are listed, but only abnormal results are displayed) Labs Reviewed - No data to display  EKG None  Radiology DG Elbow Complete Right  Result Date: 01/03/2023 CLINICAL DATA:  A small pimple like area from a spider bite 1 month ago. Now red and painful with drainage. EXAM: RIGHT ELBOW - COMPLETE 3+ VIEW COMPARISON:  None Available. FINDINGS: Degenerative changes in the right elbow.  Prominent olecranon spur. No evidence of acute fracture or dislocation. No focal bone lesion or bone destruction. Soft tissues are unremarkable. No radiopaque soft tissue foreign bodies or gas collections. IMPRESSION: Degenerative changes in the right elbow. No acute bony abnormalities. Electronically Signed   By: Burman Nieves M.D.   On: 01/03/2023 15:12    Procedures Procedures    Medications Ordered in ED Medications - No data to display  ED Course/ Medical Decision Making/ A&P                             Medical Decision Making Amount and/or Complexity of Data Reviewed Radiology: ordered.  Risk Prescription drug management.   Patient is a 62 year old male who has small indurated area just distal to his right elbow.  X-rays were obtained from triage which were interpreted by me and confirmed by the radiologist to show no evidence of bony involvement or joint effusion.  He does not have any tenderness, swelling or other evidence of joint involvement.  It is well localized.  There is a small amount of induration but no fluctuance.  I do not feel that I&D would be beneficial.  He has a small amount of surrounding erythema.  Will start doxycycline.  Advised him do warm compresses.  He was discharged home in good condition.  He was advised to keep frequent checks on his blood sugars at home.  He does say that he has a glucometer.  Encouraged him to have follow-up with his PCP for recheck within the next few days.  Return precautions were given.  Final Clinical Impression(s) / ED Diagnoses Final diagnoses:  Abscess    Rx / DC Orders ED Discharge Orders          Ordered    doxycycline (VIBRAMYCIN) 100 MG capsule  2 times daily        01/03/23 1528              Rolan Bucco, MD 01/03/23 1533

## 2023-01-03 NOTE — Discharge Instructions (Signed)
Use warm compresses as discussed.  Follow-up with your primary care doctor for recheck.  Return to the emergency room if you have any worsening symptoms.

## 2023-01-09 ENCOUNTER — Telehealth: Payer: Self-pay

## 2023-01-09 NOTE — Telephone Encounter (Signed)
Transition Care Management Unsuccessful Follow-up Telephone Call  Date of discharge and from where:  01/03/2023 [facility]  Attempts:  1st Attempt  Reason for unsuccessful TCM follow-up call:  No answer/busy  Hence Derrick [facility]  [facility] Population Health Community Resource Care Guide   [email or web address]  ?? [phone]   Website: [email or web address]  [email or web address]

## 2023-01-10 ENCOUNTER — Telehealth: Payer: Self-pay

## 2023-01-10 NOTE — Telephone Encounter (Signed)
Transition Care Management Unsuccessful Follow-up Telephone Call  Date of discharge and from where:  01/03/2023 [facility]  Attempts:  2nd Attempt  Reason for unsuccessful TCM follow-up call:  No answer/busy  Cortlin Marano [facility]  [facility] Population Health Community Resource Care Guide   [email or web address]  ?? [phone]   Website: [email or web address]  [email or web address]

## 2023-01-12 ENCOUNTER — Encounter (INDEPENDENT_AMBULATORY_CARE_PROVIDER_SITE_OTHER): Payer: Self-pay | Admitting: Primary Care

## 2023-01-12 ENCOUNTER — Ambulatory Visit (INDEPENDENT_AMBULATORY_CARE_PROVIDER_SITE_OTHER): Payer: Medicare Other | Admitting: Primary Care

## 2023-01-12 ENCOUNTER — Other Ambulatory Visit: Payer: Self-pay

## 2023-01-12 VITALS — BP 144/82 | HR 47 | Resp 16 | Wt 269.4 lb

## 2023-01-12 DIAGNOSIS — Z09 Encounter for follow-up examination after completed treatment for conditions other than malignant neoplasm: Secondary | ICD-10-CM

## 2023-01-12 DIAGNOSIS — L0291 Cutaneous abscess, unspecified: Secondary | ICD-10-CM | POA: Diagnosis not present

## 2023-01-12 DIAGNOSIS — Z7984 Long term (current) use of oral hypoglycemic drugs: Secondary | ICD-10-CM | POA: Diagnosis not present

## 2023-01-12 DIAGNOSIS — E119 Type 2 diabetes mellitus without complications: Secondary | ICD-10-CM | POA: Diagnosis not present

## 2023-01-12 MED ORDER — AMLODIPINE BESYLATE 5 MG PO TABS
10.0000 mg | ORAL_TABLET | Freq: Every day | ORAL | 1 refills | Status: DC
  Filled 2023-01-12: qty 90, 45d supply, fill #0
  Filled 2023-04-06: qty 90, 45d supply, fill #1

## 2023-01-12 NOTE — Progress Notes (Unsigned)
[facility]  Spencer Robles, is a 62 y.o. male  GUR:[id number]  EGB:[id number]  DOB - Jul 11, 1961  Chief Complaint  Patient presents with   Diabetes   Hypertension       Subjective:   Spencer Robles is a 62 y.o. obese  male here today for a follow up visit for HTN. Patient has No headache, No chest pain, No abdominal pain - No Nausea, No new weakness tingling or numbness, No Cough - shortness of breath. Type 2 diabetes controlled denies polyuria , polydipsia, poly phagia or vision changes. He recent went to the ED for a unknown bite on his right elbow Abscess - tx with Abt's. Knowing he was a DM felt the need to be seen quickly which was correct. He will complete abt's in 2 days . Today his elbow it remains warm but scabbing over , no pain still swollen.   No problems updated.  No Known Allergies  Past Medical History:  Diagnosis Date   Diabetes mellitus without complication (HCC)    Epididymitis 08/15/2017   Hx of necrotising fasciitis 06/2017   RLE/notes 08/15/2017   Scrotal swelling    left; due to Epididymitis/notes 08/15/2017   Sleep apnea    does not use a cpap    Current Outpatient Medications on File Prior to Visit  Medication Sig Dispense Refill   aspirin EC 81 MG tablet Take 1 tablet (81 mg total) by mouth daily. 90 tablet 0   Blood Glucose Monitoring Suppl (TRUE METRIX METER) w/Device KIT Use to check blood sugar up to 3 times daily. 1 kit 0   doxycycline (VIBRA-TABS) 100 MG tablet Take 1 tablet (100 mg total) by mouth 2 (two) times daily for 10 days. 20 tablet 0   Dulaglutide (TRULICITY) 1.5 MG/0.5ML SOPN Inject 1.5 mg into the skin once a week. 2 mL 2   glucose blood (TRUE METRIX BLOOD GLUCOSE TEST) test strip Use as instructed to check blood sugar up to 3 times daily. 100 each 11   ibuprofen (ADVIL) 800 MG tablet Take 800 mg by mouth every 6 (six) hours as needed for mild pain.      insulin glargine (LANTUS) 100 UNIT/ML Solostar Pen Inject 12  Units into the skin daily. 15 mL 3   Insulin Pen Needle (TECHLITE PEN NEEDLES) 31G X 8 MM MISC Use as directed. 100 each 33   lisinopril (ZESTRIL) 20 MG tablet Take 1 tablet (20 mg total) by mouth daily. 90 tablet 0   metFORMIN (GLUCOPHAGE-XR) 500 MG 24 hr tablet Take 1 tablet (500 mg total) by mouth daily with breakfast. 90 tablet 0   TRUEplus Lancets 28G MISC Use as instructed to check blood sugar up to 3 times daily. 100 each 11   [DISCONTINUED] sitaGLIPtin (JANUVIA) 100 MG tablet Take 1 tablet (100 mg total) by mouth daily. 90 tablet 1   No current facility-administered medications on file prior to visit.    Objective:   Vitals:   01/12/23 1008 01/12/23 1009 01/12/23 1027  BP: (Abnormal) 159/82 (Abnormal) 164/97 (Abnormal) 144/82  Pulse: (Abnormal) 47    Resp: 16    SpO2: 100%    Weight: 269 lb 6.4 oz (122.2 kg)      Comprehensive ROS Pertinent positive and negative noted in HPI   Exam General appearance : Awake, alert, not in any distress. Speech Clear. Not toxic looking HEENT: Atraumatic and Normocephalic, pupils equally reactive to light and accomodation Neck: Supple, no JVD. No cervical lymphadenopathy.  Chest: Good air entry bilaterally, no added sounds  CVS: S1 S2 regular, no murmurs.  Abdomen: Bowel sounds present, Non tender and not distended with no gaurding, rigidity or rebound. Extremities: B/L Lower Ext shows no edema, both legs are warm to touch Neurology: Awake alert, and oriented X 3, CN II-XII intact, Non focal Skin:see HPI  Data Review Lab Results  Component Value Date   HGBA1C 6.4 (H) 10/13/2022   HGBA1C 5.6 07/13/2022   HGBA1C 6.2 (A) 01/11/2022    Assessment & Plan  Spencer Robles was seen today for diabetes and hypertension.  Diagnoses and all orders for this visit:  Hospital discharge follow-up 2/2 Abscess  Comprehensive diabetic foot examination, type 2 DM, encounter for [facility])  Scheduled appt with Dr. Lajoyce Corners Monday 24th @ 2:30 PATIENT IS AWARE.   Discoloration diminish feeling on bilateral front better towards the heel , diminished tunnel fork and unable to see bottom for feet explained how to use aq mirror.   Patient have been counseled extensively about nutrition and exercise. Other issues discussed during this visit include: low cholesterol diet, weight control and daily exercise, foot care, annual eye examinations at Ophthalmology, importance of adherence with medications and regular follow-up. We also discussed long term complications of uncontrolled diabetes and hypertension.   No follow-ups on file.  The patient was given clear instructions to go to ER or return to medical center if symptoms don't improve, worsen or new problems develop. The patient verbalized understanding. The patient was told to call to get lab results if they haven't heard anything in the next week.   This note has been created with Education officer, environmental. Any transcriptional errors are unintentional.   Grayce Sessions, NP 01/12/2023, 10:28 AM

## 2023-01-15 ENCOUNTER — Encounter: Payer: Self-pay | Admitting: Orthopedic Surgery

## 2023-01-15 ENCOUNTER — Ambulatory Visit (INDEPENDENT_AMBULATORY_CARE_PROVIDER_SITE_OTHER): Payer: Medicare Other | Admitting: Orthopedic Surgery

## 2023-01-15 DIAGNOSIS — I872 Venous insufficiency (chronic) (peripheral): Secondary | ICD-10-CM

## 2023-01-15 NOTE — Progress Notes (Signed)
Office Visit Note   Patient: Spencer Robles           Date of Birth: 06-Sep-1960           MRN: [id number] Visit Date: 01/15/2023              Requested by: Grayce Sessions, NP [address] [place],  [place] [address] PCP: Grayce Sessions, NP  Chief Complaint  Patient presents with   Right Foot - Numbness   Left Foot - Numbness      HPI: Patient is a 62 year old gentleman who is seen in referral for evaluation for both feet.  Patient complains of decreased sensation and dark discoloration.  Assessment & Plan: Visit Diagnoses:  1. Venous stasis dermatitis of both lower extremities     Plan: Patient has good circulation with brawny edema.  Discussed mechanism of compression stockings and how they help.  Patient states he is not interested in compression stockings at this time.  Follow-Up Instructions: Return if symptoms worsen or fail to improve.   Ortho Exam  Patient is alert, oriented, no adenopathy, well-dressed, normal affect, normal respiratory effort. Examination patient has pitting edema both lower extremities no open ulcers no cellulitis no drainage.  He has a good dorsalis pedis pulse bilaterally.  No calf tenderness no evidence of DVT.  Patient's symptoms seem to be related to venous insufficiency.  Imaging: No results found. No images are attached to the encounter.  Labs: Lab Results  Component Value Date   HGBA1C 6.4 (H) 10/13/2022   HGBA1C 5.6 07/13/2022   HGBA1C 6.2 (A) 01/11/2022   ESRSEDRATE 7 08/25/2019   ESRSEDRATE 17 05/08/2018   ESRSEDRATE 70 (H) 07/03/2017   CRP 2 05/08/2018   CRP 12.9 (H) 07/03/2017   CRP 14.2 (H) 07/03/2017   REPTSTATUS 12/17/2021 FINAL 12/15/2021   GRAMSTAIN  08/26/2019    RARE WBC PRESENT,BOTH PMN AND MONONUCLEAR FEW GRAM POSITIVE COCCI IN PAIRS FEW GRAM POSITIVE RODS Performed at [facility], [address], [place], [place] [address]    CULT  12/15/2021    NO GROWTH Performed at [facility], [address], [place], [place] [address]    LABORGA METHICILLIN RESISTANT STAPHYLOCOCCUS AUREUS 08/26/2019   LABORGA KLEBSIELLA ORNITHINOLYTICA 08/26/2019     Lab Results  Component Value Date   ALBUMIN 4.6 10/13/2022   ALBUMIN 4.2 11/12/2021   ALBUMIN 4.9 (H) 07/13/2021   PREALBUMIN 8.5 (L) 07/03/2017    Lab Results  Component Value Date   MG 1.7 05/08/2018   No results found for: "VD25OH"  Lab Results  Component Value Date   PREALBUMIN 8.5 (L) 07/03/2017      Latest Ref Rng & Units 10/13/2022   10:10 AM 03/23/2022    1:13 PM 12/15/2021    5:20 PM  CBC EXTENDED  WBC 3.4 - 10.8 x10E3/uL 8.1  9.0  7.3   RBC 4.14 - 5.80 x10E6/uL 5.02  4.88  4.93   Hemoglobin 13.0 - 17.7 g/dL 46.9  62.9  52.8   HCT 37.5 - 51.0 % 41.8  42.2  42.2   Platelets 150 - 450 x10E3/uL 146  122  128   NEUT# 1.4 - 7.0 x10E3/uL 4.8   3.8   Lymph# 0.7 - 3.1 x10E3/uL 2.4   2.6      There is no height or weight on file to calculate BMI.  Orders:  No orders of the defined types were placed in this encounter.  No  orders of the defined types were placed in this encounter.    Procedures: No procedures performed  Clinical Data: No additional findings.  ROS:  All other systems negative, except as noted in the HPI. Review of Systems  Objective: Vital Signs: There were no vitals taken for this visit.  Specialty Comments:  No specialty comments available.  PMFS History: Patient Active Problem List   Diagnosis Date Noted   Cutaneous abscess of right foot    Chronic pain of left knee 03/05/2018   Scrotal abscess 08/30/2017   Abscess 08/30/2017   Epididymoorchitis 08/15/2017   Great toe amputation status, right 07/24/2017   Diabetic foot infection (HCC)    Subacute osteomyelitis, right ankle and foot (HCC)    Diabetic foot (HCC) 07/03/2017   Diabetes mellitus type 2, uncomplicated (HCC) 04/15/2015   Prehypertension 04/15/2015   Past Medical History:  Diagnosis Date    Diabetes mellitus without complication (HCC)    Epididymitis 08/15/2017   Hx of necrotising fasciitis 06/2017   RLE/notes 08/15/2017   Scrotal swelling    left; due to Epididymitis/notes 08/15/2017   Sleep apnea    does not use a cpap    History reviewed. No pertinent family history.  Past Surgical History:  Procedure Laterality Date   AMPUTATION TOE Right 07/06/2017   Procedure: RIGHT GREAT TOE AND SECOND TOE AMPUTATION;  Surgeon: Nadara Mustard, MD;  Location: [facility] OR;  Service: Orthopedics;  Laterality: Right;   CIRCUMCISION     MASS BIOPSY  ~ 2008   scrotum/notes 08/15/2017   SCROTAL EXPLORATION N/A 08/21/2017   Procedure: INCISION AND DRAINAGE SCROTAL ABSCESS, CYSTOSCOPY, COMPLEX INSERTION OF URETHERAL FOLEY CATHETER;  Surgeon: Heloise Purpura, MD;  Location[facility] OR;  Service: Urology;  Laterality: N/A;   SCROTAL EXPLORATION N/A 08/30/2017   Procedure: SCROTUM DEBRIDEMENT;  Surgeon: Heloise Purpura, MD;  Location: [facility] ORS;  Service: Urology;  Laterality: N/A;   Social History   Occupational History   Not on file  Tobacco Use   Smoking status: Never   Smokeless tobacco: Never  Vaping Use   Vaping Use: Never used  Substance and Sexual Activity   Alcohol use: No    Alcohol/week: 0.0 standard drinks of alcohol   Drug use: No   Sexual activity: Not on file

## 2023-01-19 ENCOUNTER — Other Ambulatory Visit: Payer: Self-pay

## 2023-02-21 ENCOUNTER — Other Ambulatory Visit (INDEPENDENT_AMBULATORY_CARE_PROVIDER_SITE_OTHER): Payer: Self-pay | Admitting: Primary Care

## 2023-02-21 ENCOUNTER — Other Ambulatory Visit: Payer: Self-pay

## 2023-02-21 DIAGNOSIS — Z760 Encounter for issue of repeat prescription: Secondary | ICD-10-CM

## 2023-02-22 ENCOUNTER — Other Ambulatory Visit: Payer: Self-pay

## 2023-02-22 MED ORDER — TRULICITY 1.5 MG/0.5ML ~~LOC~~ SOAJ
1.5000 mg | SUBCUTANEOUS | 2 refills | Status: DC
Start: 2023-02-22 — End: 2023-04-13
  Filled 2023-02-22 – 2023-03-02 (×2): qty 2, 28d supply, fill #0
  Filled 2023-04-06: qty 2, 28d supply, fill #1

## 2023-02-22 NOTE — Telephone Encounter (Signed)
Requested Prescriptions  Pending Prescriptions Disp Refills   Dulaglutide (TRULICITY) 1.5 MG/0.5ML SOPN 2 mL 2    Sig: Inject 1.5 mg into the skin once a week.     Endocrinology:  Diabetes - GLP-1 Receptor Agonists Passed - 02/21/2023 12:47 PM      Passed - HBA1C is between 0 and 7.9 and within 180 days    HbA1c, POC (controlled diabetic range)  Date Value Ref Range Status  07/13/2022 5.6 0.0 - 7.0 % Final   Hgb A1c MFr Bld  Date Value Ref Range Status  10/13/2022 6.4 (H) 4.8 - 5.6 % Final    Comment:             Prediabetes: 5.7 - 6.4          Diabetes: >6.4          Glycemic control for adults with diabetes: <7.0          Passed - Valid encounter within last 6 months    Recent Outpatient Visits           1 month ago Hospital discharge follow-up   [place] [facility] Grayce Sessions, NP   4 months ago Type 2 diabetes mellitus without complication, with long-term current use of insulin (HCC)   [place] [facility] Grayce Sessions, NP   6 months ago Type 2 diabetes mellitus without complication, with long-term current use of insulin [facility])   [place] [facility] [place], [place] L, RPH-CPP   7 months ago Type 2 diabetes mellitus without complication, with long-term current use of insulin (HCC)   [place] [facility] Grayce Sessions, NP   1 year ago Type 2 diabetes mellitus without complication, with long-term current use of insulin [facility])   [place] [facility] [place], Cornelius Moras, RPH-CPP       Future Appointments             In 1 month Randa Evens, Kinnie Scales, NP [place] [facility]

## 2023-03-01 ENCOUNTER — Other Ambulatory Visit: Payer: Self-pay

## 2023-03-02 ENCOUNTER — Other Ambulatory Visit: Payer: Self-pay

## 2023-03-05 ENCOUNTER — Encounter (INDEPENDENT_AMBULATORY_CARE_PROVIDER_SITE_OTHER): Payer: Medicare Other

## 2023-03-16 ENCOUNTER — Other Ambulatory Visit (INDEPENDENT_AMBULATORY_CARE_PROVIDER_SITE_OTHER): Payer: Self-pay | Admitting: Primary Care

## 2023-03-16 ENCOUNTER — Other Ambulatory Visit: Payer: Self-pay

## 2023-03-16 ENCOUNTER — Other Ambulatory Visit: Payer: Self-pay | Admitting: Internal Medicine

## 2023-03-16 DIAGNOSIS — E119 Type 2 diabetes mellitus without complications: Secondary | ICD-10-CM

## 2023-03-16 DIAGNOSIS — Z760 Encounter for issue of repeat prescription: Secondary | ICD-10-CM

## 2023-03-16 DIAGNOSIS — I10 Essential (primary) hypertension: Secondary | ICD-10-CM

## 2023-03-19 ENCOUNTER — Other Ambulatory Visit: Payer: Self-pay

## 2023-03-19 MED ORDER — METFORMIN HCL ER 500 MG PO TB24
500.0000 mg | ORAL_TABLET | Freq: Every day | ORAL | 0 refills | Status: DC
Start: 2023-03-19 — End: 2023-04-13
  Filled 2023-03-19: qty 90, 90d supply, fill #0

## 2023-03-19 MED ORDER — LISINOPRIL 20 MG PO TABS
20.0000 mg | ORAL_TABLET | Freq: Every day | ORAL | 0 refills | Status: DC
Start: 2023-03-19 — End: 2023-04-13
  Filled 2023-03-19: qty 90, 90d supply, fill #0

## 2023-03-19 NOTE — Telephone Encounter (Signed)
Requested Prescriptions  Pending Prescriptions Disp Refills   lisinopril (ZESTRIL) 20 MG tablet 90 tablet 0    Sig: Take 1 tablet (20 mg total) by mouth daily.     Cardiovascular:  ACE Inhibitors Failed - 03/16/2023  2:19 PM      Failed - Last BP in normal range    BP Readings from Last 1 Encounters:  01/12/23 (!) 144/82         Passed - Cr in normal range and within 180 days    Creat  Date Value Ref Range Status  08/17/2015 0.92 0.70 - 1.33 mg/dL Final   Creatinine, Ser  Date Value Ref Range Status  10/13/2022 1.02 0.76 - 1.27 mg/dL Final   Creatinine, Urine  Date Value Ref Range Status  08/16/2017 142.74 mg/dL Final         Passed - K in normal range and within 180 days    Potassium  Date Value Ref Range Status  10/13/2022 4.6 3.5 - 5.2 mmol/L Final         Passed - Patient is not pregnant      Passed - Valid encounter within last 6 months    Recent Outpatient Visits           2 months ago Hospital discharge follow-up   [place] [facility] Grayce Sessions, NP   5 months ago Type 2 diabetes mellitus without complication, with long-term current use of insulin (HCC)   [place] [facility] Grayce Sessions, NP   6 months ago Type 2 diabetes mellitus without complication, with long-term current use of insulin [facility])   [place] [facility] [place], [place] L, RPH-CPP   8 months ago Type 2 diabetes mellitus without complication, with long-term current use of insulin (HCC)   [place] [facility] Grayce Sessions, NP   1 year ago Type 2 diabetes mellitus without complication, with long-term current use of insulin [facility])    [place] [facility] [place], Cornelius Moras, RPH-CPP       Future Appointments             In 1 week Randa Evens, Kinnie Scales, NP  [facility]

## 2023-03-19 NOTE — Telephone Encounter (Signed)
Requested Prescriptions  Pending Prescriptions Disp Refills   metFORMIN (GLUCOPHAGE-XR) 500 MG 24 hr tablet 90 tablet 0    Sig: Take 1 tablet (500 mg total) by mouth daily with breakfast.     Endocrinology:  Diabetes - Biguanides Failed - 03/16/2023  2:19 PM      Failed - B12 Level in normal range and within 720 days    Vitamin B-12  Date Value Ref Range Status  05/08/2018 571 232 - 1,245 pg/mL Final         Passed - Cr in normal range and within 360 days    Creat  Date Value Ref Range Status  08/17/2015 0.92 0.70 - 1.33 mg/dL Final   Creatinine, Ser  Date Value Ref Range Status  10/13/2022 1.02 0.76 - 1.27 mg/dL Final   Creatinine, Urine  Date Value Ref Range Status  08/16/2017 142.74 mg/dL Final         Passed - HBA1C is between 0 and 7.9 and within 180 days    HbA1c, POC (controlled diabetic range)  Date Value Ref Range Status  07/13/2022 5.6 0.0 - 7.0 % Final   Hgb A1c MFr Bld  Date Value Ref Range Status  10/13/2022 6.4 (H) 4.8 - 5.6 % Final    Comment:             Prediabetes: 5.7 - 6.4          Diabetes: >6.4          Glycemic control for adults with diabetes: <7.0          Passed - eGFR in normal range and within 360 days    GFR, Est African American  Date Value Ref Range Status  08/17/2015 >89 >=60 mL/min Final   GFR calc Af Amer  Date Value Ref Range Status  04/14/2020 92 >59 mL/min/1.73 Final    Comment:    **[facility] currently reports eGFR in compliance with the current**   recommendations of the [employer]. [facility] will   update reporting as new guidelines are published from the NKF-ASN   Task force.    GFR, Est Non African American  Date Value Ref Range Status  08/17/2015 >89 >=60 mL/min Final    Comment:      The estimated GFR is a calculation valid for adults (>=32 years old) that uses the CKD-EPI algorithm to adjust for age and sex. It is   not to be used for children, pregnant women, hospitalized patients,    patients on  dialysis, or with rapidly changing kidney function. According to the NKDEP, eGFR >89 is normal, 60-89 shows mild impairment, 30-59 shows moderate impairment, 15-29 shows severe impairment and <15 is ESRD.      GFR, Estimated  Date Value Ref Range Status  03/23/2022 >60 >60 mL/min Final    Comment:    (NOTE) Calculated using the CKD-EPI Creatinine Equation (2021)    eGFR  Date Value Ref Range Status  10/13/2022 83 >59 mL/min/1.73 Final         Passed - Valid encounter within last 6 months    Recent Outpatient Visits           2 months ago Hospital discharge follow-up   [place] [facility] Grayce Sessions, NP   5 months ago Type 2 diabetes mellitus without complication, with long-term current use of insulin [facility])   [place] [facility] Grayce Sessions, NP   6 months ago Type 2 diabetes  mellitus without complication, with long-term current use of insulin [facility])   [place] [facility] [place], Jeannett Senior L, RPH-CPP   8 months ago Type 2 diabetes mellitus without complication, with long-term current use of insulin (HCC)   [place] [facility] Grayce Sessions, NP   1 year ago Type 2 diabetes mellitus without complication, with long-term current use of insulin (HCC)   [place] [facility] Lois Huxley, Cornelius Moras, RPH-CPP       Future Appointments             In 1 week Grayce Sessions, NP [place] [facility]            Passed - CBC within normal limits and completed in the last 12 months    WBC  Date Value Ref Range Status  10/13/2022 8.1 3.4 - 10.8 x10E3/uL Final  03/23/2022 9.0 4.0 - 10.5 K/uL Final   RBC  Date Value Ref Range Status  10/13/2022 5.02 4.14 - 5.80 x10E6/uL Final  03/23/2022 4.88 4.22 - 5.81 MIL/uL Final   Hemoglobin  Date Value Ref Range Status  10/13/2022 14.0 13.0 - 17.7 g/dL Final   Hematocrit  Date  Value Ref Range Status  10/13/2022 41.8 37.5 - 51.0 % Final   MCHC  Date Value Ref Range Status  10/13/2022 33.5 31.5 - 35.7 g/dL Final  05/20/2535 64.4 30.0 - 36.0 g/dL Final   [facility]  Date Value Ref Range Status  10/13/2022 27.9 26.6 - 33.0 pg Final  03/23/2022 28.1 26.0 - 34.0 pg Final   MCV  Date Value Ref Range Status  10/13/2022 83 79 - 97 fL Final   No results found for: "PLTCOUNTKUC", "LABPLAT", "POCPLA" RDW  Date Value Ref Range Status  10/13/2022 13.6 11.6 - 15.4 % Final

## 2023-03-27 ENCOUNTER — Ambulatory Visit (INDEPENDENT_AMBULATORY_CARE_PROVIDER_SITE_OTHER): Payer: Medicare Other | Admitting: Primary Care

## 2023-03-28 ENCOUNTER — Other Ambulatory Visit: Payer: Self-pay

## 2023-04-04 ENCOUNTER — Other Ambulatory Visit: Payer: Self-pay

## 2023-04-06 ENCOUNTER — Other Ambulatory Visit: Payer: Self-pay

## 2023-04-13 ENCOUNTER — Other Ambulatory Visit: Payer: Self-pay

## 2023-04-13 ENCOUNTER — Ambulatory Visit (INDEPENDENT_AMBULATORY_CARE_PROVIDER_SITE_OTHER): Payer: Medicare Other | Admitting: Primary Care

## 2023-04-13 VITALS — BP 134/78 | HR 47 | Resp 16 | Ht 71.0 in | Wt 266.0 lb

## 2023-04-13 DIAGNOSIS — Z760 Encounter for issue of repeat prescription: Secondary | ICD-10-CM

## 2023-04-13 DIAGNOSIS — Z794 Long term (current) use of insulin: Secondary | ICD-10-CM | POA: Diagnosis not present

## 2023-04-13 DIAGNOSIS — E119 Type 2 diabetes mellitus without complications: Secondary | ICD-10-CM | POA: Diagnosis not present

## 2023-04-13 DIAGNOSIS — I10 Essential (primary) hypertension: Secondary | ICD-10-CM

## 2023-04-13 LAB — POCT GLYCOSYLATED HEMOGLOBIN (HGB A1C): HbA1c, POC (controlled diabetic range): 5.7 % (ref 0.0–7.0)

## 2023-04-13 MED ORDER — AMLODIPINE BESYLATE 10 MG PO TABS
10.0000 mg | ORAL_TABLET | Freq: Every day | ORAL | 1 refills | Status: DC
  Filled 2023-04-13 – 2023-06-08 (×2): qty 90, 90d supply, fill #0

## 2023-04-13 MED ORDER — INSULIN GLARGINE 100 UNIT/ML SOLOSTAR PEN
12.0000 [IU] | PEN_INJECTOR | Freq: Every day | SUBCUTANEOUS | 3 refills | Status: AC
  Filled 2023-04-13 – 2023-06-22 (×2): qty 15, 125d supply, fill #0

## 2023-04-13 MED ORDER — TRULICITY 1.5 MG/0.5ML ~~LOC~~ SOAJ
1.5000 mg | SUBCUTANEOUS | 2 refills | Status: DC
  Filled 2023-04-13 – 2023-05-04 (×2): qty 2, 28d supply, fill #0
  Filled 2023-06-08: qty 2, 28d supply, fill #1
  Filled 2023-07-13: qty 2, 28d supply, fill #2

## 2023-04-13 MED ORDER — LISINOPRIL 20 MG PO TABS
20.0000 mg | ORAL_TABLET | Freq: Every day | ORAL | 1 refills | Status: DC
  Filled 2023-04-13 – 2023-06-08 (×2): qty 90, 90d supply, fill #0

## 2023-04-13 MED ORDER — METFORMIN HCL ER 500 MG PO TB24
500.0000 mg | ORAL_TABLET | Freq: Every day | ORAL | 1 refills | Status: DC
  Filled 2023-04-13 – 2023-05-04 (×2): qty 90, 90d supply, fill #0

## 2023-04-13 NOTE — Progress Notes (Unsigned)
BS [id number]  [facility]  Spencer Robles, is a 62 y.o. male  ZOX:[id number]  [id number]  DOB - [birth year]  Chief Complaint  Patient presents with   Diabetes       Subjective:   Spencer Robles is a 62 y.o. male here today for a follow up visit. HTN and T2D Patient has No headache, No chest pain, No abdominal pain - No Nausea, No new weakness tingling or numbness, No Cough - shortness of breath.  Denies polyuria, polydipsia, polyphasia or vision changes.  Does not check blood sugars at home.   No problems updated.  No Known Allergies  Past Medical History:  Diagnosis Date   Diabetes mellitus without complication (HCC)    Epididymitis 08/15/2017   Hx of necrotising fasciitis 06/2017   RLE/notes 08/15/2017   Scrotal swelling    left; due to Epididymitis/notes 08/15/2017   Sleep apnea    does not use a cpap    Current Outpatient Medications on File Prior to Visit  Medication Sig Dispense Refill   aspirin EC 81 MG tablet Take 1 tablet (81 mg total) by mouth daily. 90 tablet 0   Blood Glucose Monitoring Suppl (TRUE METRIX METER) w/Device KIT Use to check blood sugar up to 3 times daily. 1 kit 0   glucose blood (TRUE METRIX BLOOD GLUCOSE TEST) test strip Use as instructed to check blood sugar up to 3 times daily. 100 each 11   ibuprofen (ADVIL) 800 MG tablet Take 800 mg by mouth every 6 (six) hours as needed for mild pain.      Insulin Pen Needle (TECHLITE PEN NEEDLES) 31G X 8 MM MISC Use as directed. 100 each 33   TRUEplus Lancets 28G MISC Use as instructed to check blood sugar up to 3 times daily. 100 each 11   [DISCONTINUED] sitaGLIPtin (JANUVIA) 100 MG tablet Take 1 tablet (100 mg total) by mouth daily. 90 tablet 1   No current facility-administered medications on file prior to visit.    Objective:   Vitals:   04/13/23 0934  BP: 134/78  Pulse: (!) 47  Resp: 16  SpO2: 98%  Weight: 266 lb (120.7 kg)  Height: 5\' 11"  (1.803 m)    Comprehensive ROS  Pertinent positive and negative noted in HPI   Exam General appearance : Awake, alert, not in any distress. Speech Clear. Not toxic looking HEENT: Atraumatic and Normocephalic, pupils equally reactive to light and accomodation Neck: Supple, no JVD. No cervical lymphadenopathy.  Chest: Good air entry bilaterally, no added sounds  CVS: S1 S2 regular, no murmurs.  Abdomen: Bowel sounds present, Non tender and not distended with no gaurding, rigidity or rebound. Extremities: B/L Lower Ext shows no edema, both legs are warm to touch Neurology: Awake alert, and oriented X 3, , Non focal Skin: No Rash  Data Review Lab Results  Component Value Date   HGBA1C 5.7 04/13/2023   HGBA1C 6.4 (H) 10/13/2022   HGBA1C 5.6 07/13/2022    Assessment & Plan  Spencer Robles was seen today for diabetes.  Diagnoses and all orders for this visit:  Type 2 diabetes mellitus without complication, with long-term current use of insulin (HCC) -     POCT glycosylated hemoglobin (Hb A1C) -     Lipid Panel -     Dulaglutide (TRULICITY) 1.5 MG/0.5ML SOPN; Inject 1.5 mg into the skin once a week. -     insulin glargine (LANTUS) 100 UNIT/ML Solostar Pen; Inject 12 Units into the skin daily. -  lisinopril (ZESTRIL) 20 MG tablet; Take 1 tablet (20 mg total) by mouth daily. -     metFORMIN (GLUCOPHAGE-XR) 500 MG 24 hr tablet; Take 1 tablet (500 mg total) by mouth daily with breakfast.  Essential hypertension Well controlled -     lisinopril (ZESTRIL) 20 MG tablet; Take 1 tablet (20 mg total) by mouth daily.  Medication refill -     Dulaglutide (TRULICITY) 1.5 MG/0.5ML SOPN; Inject 1.5 mg into the skin once a week. -     insulin glargine (LANTUS) 100 UNIT/ML Solostar Pen; Inject 12 Units into the skin daily. -     lisinopril (ZESTRIL) 20 MG tablet; Take 1 tablet (20 mg total) by mouth daily. -     metFORMIN (GLUCOPHAGE-XR) 500 MG 24 hr tablet; Take 1 tablet (500 mg total) by mouth daily with breakfast.  Other  orders -     amLODipine (NORVASC) 10 MG tablet; Take 1 tablet (10 mg total) by mouth daily.     Patient have been counseled extensively about nutrition and exercise. Other issues discussed during this visit include: low cholesterol diet, weight control and daily exercise, foot care, annual eye examinations at Ophthalmology, importance of adherence with medications and regular follow-up. We also discussed long term complications of uncontrolled diabetes and hypertension.   Return in about 3 months (around 07/13/2023) for medical conditions.  The patient was given clear instructions to go to ER or return to medical center if symptoms don't improve, worsen or new problems develop. The patient verbalized understanding. The patient was told to call to get lab results if they haven't heard anything in the next week.   This note has been created with Education officer, environmental. Any transcriptional errors are unintentional.   Grayce Sessions, NP 04/14/2023, 9:53 PM

## 2023-04-14 LAB — LIPID PANEL
Chol/HDL Ratio: 3.4 ratio (ref 0.0–5.0)
Cholesterol, Total: 153 mg/dL (ref 100–199)
HDL: 45 mg/dL (ref >39)
LDL Chol Calc (NIH): 94 mg/dL (ref 0–99)
Triglycerides: 73 mg/dL (ref 0–149)
VLDL Cholesterol Cal: 14 mg/dL (ref 5–40)

## 2023-04-16 ENCOUNTER — Other Ambulatory Visit (INDEPENDENT_AMBULATORY_CARE_PROVIDER_SITE_OTHER): Payer: Self-pay | Admitting: Primary Care

## 2023-04-16 MED ORDER — PRAVASTATIN SODIUM 10 MG PO TABS
10.0000 mg | ORAL_TABLET | Freq: Every day | ORAL | 1 refills | Status: DC
  Filled 2023-04-16: qty 90, 90d supply, fill #0

## 2023-04-17 ENCOUNTER — Other Ambulatory Visit: Payer: Self-pay

## 2023-04-23 ENCOUNTER — Other Ambulatory Visit: Payer: Self-pay

## 2023-04-27 ENCOUNTER — Other Ambulatory Visit: Payer: Self-pay

## 2023-05-04 ENCOUNTER — Other Ambulatory Visit: Payer: Self-pay

## 2023-05-07 ENCOUNTER — Encounter (INDEPENDENT_AMBULATORY_CARE_PROVIDER_SITE_OTHER): Payer: Medicare Other

## 2023-06-08 ENCOUNTER — Other Ambulatory Visit (INDEPENDENT_AMBULATORY_CARE_PROVIDER_SITE_OTHER): Payer: Self-pay | Admitting: Primary Care

## 2023-06-08 ENCOUNTER — Other Ambulatory Visit: Payer: Self-pay

## 2023-06-08 MED ORDER — TECHLITE PEN NEEDLES 31G X 8 MM MISC
1.0000 | Freq: Two times a day (BID) | 33 refills | Status: AC | PRN
  Filled 2023-06-08: qty 100, 90d supply, fill #0

## 2023-06-22 ENCOUNTER — Other Ambulatory Visit: Payer: Self-pay

## 2023-07-13 ENCOUNTER — Other Ambulatory Visit: Payer: Self-pay

## 2023-07-13 ENCOUNTER — Ambulatory Visit (INDEPENDENT_AMBULATORY_CARE_PROVIDER_SITE_OTHER): Payer: Medicare Other | Admitting: Primary Care

## 2023-07-13 ENCOUNTER — Encounter (INDEPENDENT_AMBULATORY_CARE_PROVIDER_SITE_OTHER): Payer: Self-pay | Admitting: Primary Care

## 2023-07-13 VITALS — BP 136/81 | HR 50 | Resp 16 | Wt 272.4 lb

## 2023-07-13 DIAGNOSIS — Z794 Long term (current) use of insulin: Secondary | ICD-10-CM | POA: Diagnosis not present

## 2023-07-13 DIAGNOSIS — E119 Type 2 diabetes mellitus without complications: Secondary | ICD-10-CM

## 2023-07-13 DIAGNOSIS — I10 Essential (primary) hypertension: Secondary | ICD-10-CM

## 2023-07-13 NOTE — Progress Notes (Unsigned)
[facility]  Spencer Robles, is a 62 y.o. male  NAT:[id number]  [id number]:[id number]  DOB - November 20, 1960  Chief Complaint  Patient presents with   Diabetes   Hypertension       Subjective:   Spencer Robles is a 62 y.o. male here today for a follow up visit. Patient has No headache, No chest pain, No abdominal pain - No Nausea, No new weakness tingling or numbness, No Cough - shortness of breath HPI  No problems updated.  Comprehensive ROS Pertinent positive and negative noted in HPI   No Known Allergies  Past Medical History:  Diagnosis Date   Diabetes mellitus without complication (HCC)    Epididymitis 08/15/2017   Hx of necrotising fasciitis 06/2017   RLE/notes 08/15/2017   Scrotal swelling    left; due to Epididymitis/notes 08/15/2017   Sleep apnea    does not use a cpap    Current Outpatient Medications on File Prior to Visit  Medication Sig Dispense Refill   amLODipine (NORVASC) 10 MG tablet Take 1 tablet (10 mg total) by mouth daily. 90 tablet 1   aspirin EC 81 MG tablet Take 1 tablet (81 mg total) by mouth daily. 90 tablet 0   Blood Glucose Monitoring Suppl (TRUE METRIX METER) w/Device KIT Use to check blood sugar up to 3 times daily. 1 kit 0   Dulaglutide (TRULICITY) 1.5 MG/0.5ML SOAJ Inject 1.5 mg into the skin once a week. 2 mL 2   glucose blood (TRUE METRIX BLOOD GLUCOSE TEST) test strip Use as instructed to check blood sugar up to 3 times daily. 100 each 11   ibuprofen (ADVIL) 800 MG tablet Take 800 mg by mouth every 6 (six) hours as needed for mild pain.      insulin glargine (LANTUS) 100 UNIT/ML Solostar Pen Inject 12 Units into the skin daily. 15 mL 3   Insulin Pen Needle (TECHLITE PEN NEEDLES) 31G X 8 MM MISC Use as directed. 100 each 33   lisinopril (ZESTRIL) 20 MG tablet Take 1 tablet (20 mg total) by mouth daily. 90 tablet 1   metFORMIN (GLUCOPHAGE-XR) 500 MG 24 hr tablet Take 1 tablet (500 mg total) by mouth daily with breakfast. 90 tablet 1    pravastatin (PRAVACHOL) 10 MG tablet Take 1 tablet (10 mg total) by mouth daily. 90 tablet 1   TRUEplus Lancets 28G MISC Use as instructed to check blood sugar up to 3 times daily. 100 each 11   [DISCONTINUED] sitaGLIPtin (JANUVIA) 100 MG tablet Take 1 tablet (100 mg total) by mouth daily. 90 tablet 1   No current facility-administered medications on file prior to visit.   Health Maintenance  Topic Date Due   Medicare Annual Wellness Visit  03/04/2023   COVID-19 Vaccine (1 - 2024-25 season) Never done   Zoster (Shingles) Vaccine (1 of 2) 07/13/2023*   Flu Shot  10/22/2023*   Eye exam for diabetics  08/31/2023   Hemoglobin A1C  10/11/2023   Yearly kidney function blood test for diabetes  10/13/2023   Yearly kidney health urinalysis for diabetes  10/13/2023   Complete foot exam   01/12/2024   DTaP/Tdap/Td vaccine (3 - Td or Tdap) 06/10/2031   Colon Cancer Screening  11/10/2031   Hepatitis C Screening  Completed   HIV Screening  Completed   HPV Vaccine  Aged Out  *Topic was postponed. The date shown is not the original due date.    Objective:   Vitals:   07/13/23 0925  BP:  136/81  Pulse: (!) 50  Resp: 16  SpO2: 99%  Weight: 272 lb 6.4 oz (123.6 kg)   BP Readings from Last 3 Encounters:  07/13/23 136/81  04/13/23 134/78  01/12/23 (!) 144/82      Physical Exam Vitals reviewed.  Constitutional:      Appearance: He is obese.  HENT:     Head: Normocephalic and atraumatic.     Right Ear: Tympanic membrane and external ear normal.     Left Ear: Tympanic membrane and external ear normal.     Nose: Congestion present.  Eyes:     Extraocular Movements: Extraocular movements intact.     Pupils: Pupils are equal, round, and reactive to light.  Cardiovascular:     Rate and Rhythm: Normal rate and regular rhythm.  Pulmonary:     Effort: Pulmonary effort is normal.     Breath sounds: Normal breath sounds.  Abdominal:     General: Abdomen is flat. Bowel sounds are normal.      Palpations: Abdomen is soft.  Musculoskeletal:        General: Normal range of motion.     Cervical back: Normal range of motion and neck supple.  Skin:    General: Skin is warm and dry.  Neurological:     Mental Status: He is alert and oriented to person, place, and time.  Psychiatric:        Mood and Affect: Mood normal.        Behavior: Behavior normal.     {Labs (Optional):23779} Assessment & Plan  Type 2 diabetes mellitus without complication, with long-term current use of insulin (HCC) -     CMP14+EGFR -     Hemoglobin A1c  Essential hypertension -     CBC with Differential/Platelet -     CMP14+EGFR     Patient have been counseled extensively about nutrition and exercise. Other issues discussed during this visit include: low cholesterol diet, weight control and daily exercise, foot care, annual eye examinations at Ophthalmology, importance of adherence with medications and regular follow-up. We also discussed long term complications of uncontrolled diabetes and hypertension.   No follow-ups on file.  The patient was given clear instructions to go to ER or return to medical center if symptoms don't improve, worsen or new problems develop. The patient verbalized understanding. The patient was told to call to get lab results if they haven't heard anything in the next week.   This note has been created with Education officer, environmental. Any transcriptional errors are unintentional.   Grayce Sessions, NP 07/13/2023, 9:33 AM

## 2023-07-14 LAB — CBC WITH DIFFERENTIAL/PLATELET
Basophils Absolute: 0.1 10*3/uL (ref 0.0–0.2)
Basos: 1 %
EOS (ABSOLUTE): 0.3 10*3/uL (ref 0.0–0.4)
Eos: 4 %
Hematocrit: 40.1 % (ref 37.5–51.0)
Hemoglobin: 13.4 g/dL (ref 13.0–17.7)
Immature Grans (Abs): 0 10*3/uL (ref 0.0–0.1)
Immature Granulocytes: 0 %
Lymphocytes Absolute: 2.1 10*3/uL (ref 0.7–3.1)
Lymphs: 29 %
MCH: 28.8 pg (ref 26.6–33.0)
MCHC: 33.4 g/dL (ref 31.5–35.7)
MCV: 86 fL (ref 79–97)
Monocytes Absolute: 0.5 10*3/uL (ref 0.1–0.9)
Monocytes: 7 %
Neutrophils Absolute: 4.4 10*3/uL (ref 1.4–7.0)
Neutrophils: 59 %
Platelets: 136 10*3/uL — ABNORMAL LOW (ref 150–450)
RBC: 4.65 x10E6/uL (ref 4.14–5.80)
RDW: 13.7 % (ref 11.6–15.4)
WBC: 7.4 10*3/uL (ref 3.4–10.8)

## 2023-07-14 LAB — CMP14+EGFR
ALT: 18 [IU]/L (ref 0–44)
AST: 17 [IU]/L (ref 0–40)
Albumin: 4.5 g/dL (ref 3.9–4.9)
Alkaline Phosphatase: 122 [IU]/L — ABNORMAL HIGH (ref 44–121)
BUN/Creatinine Ratio: 14 (ref 10–24)
BUN: 16 mg/dL (ref 8–27)
Bilirubin Total: 0.3 mg/dL (ref 0.0–1.2)
CO2: 25 mmol/L (ref 20–29)
Calcium: 9.6 mg/dL (ref 8.6–10.2)
Chloride: 104 mmol/L (ref 96–106)
Creatinine, Ser: 1.13 mg/dL (ref 0.76–1.27)
Globulin, Total: 2.4 g/dL (ref 1.5–4.5)
Glucose: 89 mg/dL (ref 70–99)
Potassium: 4.4 mmol/L (ref 3.5–5.2)
Sodium: 143 mmol/L (ref 134–144)
Total Protein: 6.9 g/dL (ref 6.0–8.5)
eGFR: 73 mL/min/{1.73_m2} (ref >59)

## 2023-07-14 LAB — HEMOGLOBIN A1C
Est. average glucose Bld gHb Est-mCnc: 120 mg/dL
Hgb A1c MFr Bld: 5.8 % — ABNORMAL HIGH (ref 4.8–5.6)

## 2023-08-21 ENCOUNTER — Emergency Department (HOSPITAL_COMMUNITY): Payer: 59

## 2023-08-21 ENCOUNTER — Encounter (HOSPITAL_COMMUNITY): Payer: Self-pay | Admitting: Emergency Medicine

## 2023-08-21 ENCOUNTER — Other Ambulatory Visit: Payer: Self-pay

## 2023-08-21 ENCOUNTER — Emergency Department (HOSPITAL_COMMUNITY)
Admission: EM | Admit: 2023-08-21 | Discharge: 2023-08-21 | Disposition: A | Discharge status: Home / Self Care | Payer: 59

## 2023-08-21 DIAGNOSIS — Z7984 Long term (current) use of oral hypoglycemic drugs: Secondary | ICD-10-CM | POA: Insufficient documentation

## 2023-08-21 DIAGNOSIS — Z79899 Other long term (current) drug therapy: Secondary | ICD-10-CM | POA: Diagnosis not present

## 2023-08-21 DIAGNOSIS — L0291 Cutaneous abscess, unspecified: Secondary | ICD-10-CM | POA: Diagnosis present

## 2023-08-21 DIAGNOSIS — L02512 Cutaneous abscess of left hand: Secondary | ICD-10-CM | POA: Insufficient documentation

## 2023-08-21 DIAGNOSIS — Z794 Long term (current) use of insulin: Secondary | ICD-10-CM | POA: Insufficient documentation

## 2023-08-21 DIAGNOSIS — E119 Type 2 diabetes mellitus without complications: Secondary | ICD-10-CM | POA: Insufficient documentation

## 2023-08-21 DIAGNOSIS — Z7982 Long term (current) use of aspirin: Secondary | ICD-10-CM | POA: Diagnosis not present

## 2023-08-21 LAB — CBC WITH DIFFERENTIAL/PLATELET
Abs Immature Granulocytes: 0.04 10*3/uL (ref 0.00–0.07)
Basophils Absolute: 0 10*3/uL (ref 0.0–0.1)
Basophils Relative: 0 %
Eosinophils Absolute: 0.1 10*3/uL (ref 0.0–0.5)
Eosinophils Relative: 1 %
HCT: 40.4 % (ref 39.0–52.0)
Hemoglobin: 13.4 g/dL (ref 13.0–17.0)
Immature Granulocytes: 0 %
Lymphocytes Relative: 17 %
Lymphs Abs: 1.8 10*3/uL (ref 0.7–4.0)
MCH: 28.5 pg (ref 26.0–34.0)
MCHC: 33.2 g/dL (ref 30.0–36.0)
MCV: 86 fL (ref 80.0–100.0)
Monocytes Absolute: 0.6 10*3/uL (ref 0.1–1.0)
Monocytes Relative: 6 %
Neutro Abs: 7.8 10*3/uL — ABNORMAL HIGH (ref 1.7–7.7)
Neutrophils Relative %: 76 %
Platelets: 121 10*3/uL — ABNORMAL LOW (ref 150–400)
RBC: 4.7 MIL/uL (ref 4.22–5.81)
RDW: 13.2 % (ref 11.5–15.5)
WBC: 10.5 10*3/uL (ref 4.0–10.5)
nRBC: 0 % (ref 0.0–0.2)

## 2023-08-21 LAB — BASIC METABOLIC PANEL
Anion gap: 7 (ref 5–15)
BUN: 15 mg/dL (ref 8–23)
CO2: 26 mmol/L (ref 22–32)
Calcium: 8.9 mg/dL (ref 8.9–10.3)
Chloride: 104 mmol/L (ref 98–111)
Creatinine, Ser: 1.05 mg/dL (ref 0.61–1.24)
GFR, Estimated: >60 mL/min (ref >60)
Glucose, Bld: 117 mg/dL — ABNORMAL HIGH (ref 70–99)
Potassium: 4.4 mmol/L (ref 3.5–5.1)
Sodium: 137 mmol/L (ref 135–145)

## 2023-08-21 LAB — LACTIC ACID, PLASMA: Lactic Acid, Venous: 0.9 mmol/L (ref 0.5–1.9)

## 2023-08-21 MED ORDER — DOXYCYCLINE HYCLATE 100 MG PO TABS
100.0000 mg | ORAL_TABLET | Freq: Once | ORAL | Status: AC
  Administered 2023-08-21: 100 mg via ORAL
  Filled 2023-08-21: qty 1

## 2023-08-21 MED ORDER — IOHEXOL 350 MG/ML SOLN
75.0000 mL | Freq: Once | INTRAVENOUS | Status: AC | PRN
  Administered 2023-08-21: 75 mL via INTRAVENOUS

## 2023-08-21 MED ORDER — DOXYCYCLINE HYCLATE 100 MG PO CAPS
100.0000 mg | ORAL_CAPSULE | Freq: Two times a day (BID) | ORAL | 0 refills | Status: AC
  Filled 2023-08-21: qty 14, 7d supply, fill #0

## 2023-08-21 NOTE — ED Triage Notes (Signed)
PT complains of spider bite to left hand x 5 days. Pt states hand has been draining and swelling. PT states swelling has improved over the past couple of days. Denies fever. Pt has been using homeo pathetic treatment to help with swelling and drainage.

## 2023-08-21 NOTE — ED Provider Notes (Signed)
[place] EMERGENCY DEPARTMENT AT [facility] Provider Note   CSN: [id number] Arrival date & time: 08/21/23  1013     History  Chief Complaint  Patient presents with   Insect Bite    Spencer Robles is a 63 y.o. male history of diabetes, osteomyelitis in his right foot and ankle presented with left hand abscess for the past 5 days.  Patient's been using herbal remedies at home but states that he still has an abscess.  Patient denies any fevers but states that the abscess is draining purulent material.  Patient denies any new onset weakness or hand swelling and states that since that has been draining it feels better.  Patient is able to grip with his left hand and denies any paresthesias or stiffness in the hand wrist or fingers.  Patient denies fevers.  Patient thinks that he may have been bitten by a spider.  Home Medications Prior to Admission medications   Medication Sig Start Date End Date Taking? Authorizing Provider  doxycycline (VIBRAMYCIN) 100 MG capsule Take 1 capsule (100 mg total) by mouth 2 (two) times daily for 7 days. 08/21/23 08/28/23 Yes Searra Carnathan, Beverly Gust, PA-C  amLODipine (NORVASC) 10 MG tablet Take 1 tablet (10 mg total) by mouth daily. 04/13/23   Grayce Sessions, NP  aspirin EC 81 MG tablet Take 1 tablet (81 mg total) by mouth daily. 08/28/19   Albertine Grates, MD  Blood Glucose Monitoring Suppl (TRUE METRIX METER) w/Device KIT Use to check blood sugar up to 3 times daily. 08/28/19   Albertine Grates, MD  Dulaglutide (TRULICITY) 1.5 MG/0.5ML SOAJ Inject 1.5 mg into the skin once a week. 04/13/23   Grayce Sessions, NP  glucose blood (TRUE METRIX BLOOD GLUCOSE TEST) test strip Use as instructed to check blood sugar up to 3 times daily. 08/28/19   Albertine Grates, MD  ibuprofen (ADVIL) 800 MG tablet Take 800 mg by mouth every 6 (six) hours as needed for mild pain.     [provider]  insulin glargine (LANTUS) 100 UNIT/ML Solostar Pen Inject 12 Units into the skin daily. 04/13/23    Grayce Sessions, NP  Insulin Pen Needle (TECHLITE PEN NEEDLES) 31G X 8 MM MISC Use as directed. 06/08/23   Grayce Sessions, NP  lisinopril (ZESTRIL) 20 MG tablet Take 1 tablet (20 mg total) by mouth daily. 04/13/23   Grayce Sessions, NP  metFORMIN (GLUCOPHAGE-XR) 500 MG 24 hr tablet Take 1 tablet (500 mg total) by mouth daily with breakfast. 04/13/23   Grayce Sessions, NP  pravastatin (PRAVACHOL) 10 MG tablet Take 1 tablet (10 mg total) by mouth daily. 04/16/23   Grayce Sessions, NP  TRUEplus Lancets 28G MISC Use as instructed to check blood sugar up to 3 times daily. 08/28/19   Albertine Grates, MD  sitaGLIPtin (JANUVIA) 100 MG tablet Take 1 tablet (100 mg total) by mouth daily. 01/13/20 10/20/20  Grayce Sessions, NP      Allergies    Patient has no known allergies.    Review of Systems   Review of Systems  Physical Exam Updated Vital Signs BP 136/80 (BP Location: Left Arm)   Pulse 87   Temp 98.3 F (36.8 C) (Oral)   Resp 18   Ht 5\' 11"  (1.803 m)   Wt 124.7 kg   SpO2 100%   BMI 38.35 kg/m  Physical Exam Vitals reviewed.  Constitutional:      General: He is not in acute  distress. Cardiovascular:     Rate and Rhythm: Normal rate.     Pulses: Normal pulses.  Musculoskeletal:     Comments: 5 out of 5 left-sided grip strength/wrist extension/flexion, no bony tenderness or abnormalities Pain not out of proportion Soft compartments  Skin:    General: Skin is warm and dry.     Capillary Refill: Capillary refill takes less than 2 seconds.     Comments: 1 cm abscess on the dorsal side of left hand that you are able to express purulent material from Soft compartments Pain not out of proportion  Neurological:     Mental Status: He is alert.     Comments: Sensation intact distally  Psychiatric:        Mood and Affect: Mood normal.     ED Results / Procedures / Treatments   Labs (all labs ordered are listed, but only abnormal results are displayed) Labs Reviewed   CBC WITH DIFFERENTIAL/PLATELET - Abnormal; Notable for the following components:      Result Value   Platelets 121 (*)    Neutro Abs 7.8 (*)    All other components within normal limits  BASIC METABOLIC PANEL - Abnormal; Notable for the following components:   Glucose, Bld 117 (*)    All other components within normal limits  CULTURE, BLOOD (ROUTINE X 2)  CULTURE, BLOOD (ROUTINE X 2)  LACTIC ACID, PLASMA    EKG None  Radiology CT HAND LEFT W CONTRAST Result Date: 08/21/2023 CLINICAL DATA:  Soft tissue infection suspected, left hand. Spider bite to left hand 5 days ago. Patient reports hand has been draining and swelling. Swelling has improved over the past couple of days. Denies fever. EXAM: CT OF THE UPPER LEFT EXTREMITY WITH CONTRAST TECHNIQUE: Multidetector CT imaging of the left hand was performed according to the standard protocol following intravenous contrast administration. RADIATION DOSE REDUCTION: This exam was performed according to the departmental dose-optimization program which includes automated exposure control, adjustment of the mA and/or kV according to patient size and/or use of iterative reconstruction technique. CONTRAST:  75mL OMNIPAQUE IOHEXOL 350 MG/ML SOLN COMPARISON:  left hand radiographs 04/06/2018 FINDINGS: Bones/Joint/Cartilage There is widening of the scapholunate interval up to 5 mm. The prior radiographs are obliqued in this region, limiting measurement of the scapholunate interval. Minimal triscaphe and thumb carpometacarpal joint space narrowing and peripheral osteophytosis. Mild-to-moderate second through fifth DIP joint space narrowing and mild peripheral osteophytosis. Mild thumb interphalangeal joint space narrowing and dorsal degenerative spurring. Mild chronic spurring at the dorsal aspect of the lateral/radial base of the third metacarpal (ww coronal series 6, image 24, axial series 2, image 92, sagittal series 7, image 37). No acute fracture or  dislocation.  No cortical erosion. Ligaments Suboptimally assessed by CT. Muscles and Tendons The visualized tendons are intact. Normal density and size of the regional musculature. Soft tissues There is moderate subcutaneous fat edema and swelling throughout the hand and fingers, greatest within the dorsal hand at the level of the fifth metacarpal and dorsal hand at the level of the space between the proximal second and third metacarpals. There is a low-density region measuring up to approximately 9 x 9 x 10 mm (transverse by AP by craniocaudal, axial series 4, image 85 and coronal series 8, image 19) within the soft tissue swelling dorsal to the proximal second and third metacarpal shafts. There is overlying dorsal skin thickening with dorsal enhancement around this fluid but no complete walled-off enhancement. This is favored to represent  inflammatory phlegmon versus an early developing abscess. There is subcutaneous fat fluid dorsal medial/ulnar to the distal ulnar diaphysis that measures up to 9 mm in transverse thickness (axial series 4, image 106 and coronal series 8, image 15). IMPRESSION: 1. Moderate subcutaneous fat edema and swelling throughout the hand and fingers, greatest within the dorsal hand at the level of the fifth metacarpal and dorsal hand at the level of the space between the proximal second and third metacarpals. There is a low-density fluid region measuring up to approximately 9 x 9 x 10 mm within the soft tissue swelling dorsal to the proximal second and third metacarpal shafts. This is favored to represent inflammatory phlegmon versus an early developing abscess. This does not appear to be completely walled-off. 2. There is subcutaneous fat fluid dorsal medial/ulnar to the distal ulnar diaphysis that measures up to 9 mm in transverse thickness. 3. No acute fracture or dislocation. No cortical erosion. 4. Widening of the scapholunate interval up to 5 mm. 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 Electronically Signed   By: Neita Garnet M.D.   On: 08/21/2023 16:07    Procedures Procedures    Medications Ordered in ED Medications  iohexol (OMNIPAQUE) 350 MG/ML injection 75 mL (75 mLs Intravenous Contrast Given 08/21/23 1528)  doxycycline (VIBRA-TABS) tablet 100 mg (100 mg Oral Given 08/21/23 1915)    ED Course/ Medical Decision Making/ A&P                                 Medical Decision Making Risk Prescription drug management.   Giorgi Debruin 63 y.o. presented today for an abscess.  Working DDx that I considered at this time includes, but not limited to, abscess, cellulitis, erysipelas, HS, folliculitis, lymphangitis, necrotizing fasciitis/cellulitis, Fournier's, DVT.  R/o DDx: cellulitis, erysipelas, HS, folliculitis, lymphangitis, necrotizing fasciitis/cellulitis, Fournier's, DVT, flexor tenosynovitis: These are considered less likely due to history of present illness, physical exam, labs/imaging findings  Review of prior external notes: 01/15/2023 office visit  Unique Tests and My Independent Interpretation:  CBC: Unremarkable BMP: Unremarkable Lactic acid: Unremarkable Blood cultures: Pending CT hand left with contrast: Possible early abscess  Social Determinants of Health: none  Discussion with Independent Historian: None  Discussion of Management of Tests: None  Risk: Medium: Prescription medication use  Risk Stratification Score: None  Staffed with Young, DO  Plan: On exam patient was in no acute distress stable vitals.  Patient's labs and imaging from triage do show possible early abscess.  When I evaluated the patient patient does have 1 cm abscess noted on the dorsal aspect of his left hand that is actively draining purulent material.  Patient I had shared decision making and patient agreed to forego incision and drainage as it is already draining and we will give antibiotics and  follow-up with the wound clinic along with hand.  Patient was not having any systemic symptoms at this time does not require admission.  Patient is able to fully range his hand and does not have any dactylitis or fingers held in flexion as would be concerning for tenosynovitis.  Patient was given return precautions. Patient stable for discharge at this time.  Patient verbalized understanding of plan.  This chart was dictated using voice recognition software.  Despite best efforts to proofread,  errors can occur which can change the documentation meaning.        Final  Clinical Impression(s) / ED Diagnoses Final diagnoses:  Abscess    Rx / DC Orders ED Discharge Orders          Ordered    doxycycline (VIBRAMYCIN) 100 MG capsule  2 times daily        08/21/23 1904              Remi Deter 08/21/23 1932    Coral Spikes, DO 08/21/23 2233

## 2023-08-21 NOTE — Discharge Instructions (Signed)
Please follow-up with the wound clinic and hand specialist have attached here for today in regards to your hand abscess.  Today you are given 1 dose of your antibiotics but you need to take the rest.  Please take these as prescribed and symptoms change or worsen please return to the ER.

## 2023-08-21 NOTE — ED Provider Triage Note (Signed)
Emergency Medicine Provider Triage Evaluation Note  [employer] , a 63 y.o. male  was evaluated in triage.  Pt complains of Swelling and discharge from left hand for the past week. Notes history of diabetes and BS has been controlled at home. Denies any fever, chills, abd pain, N/V/D.  Review of Systems  Positive: Swelling and pain to left hand  Negative: Fever, chills  Physical Exam  BP (!) 143/66 (BP Location: Left Arm)   Pulse (!) 58   Temp 98.2 F (36.8 C) (Oral)   Resp 18   Ht 5\' 11"  (1.803 m)   Wt 124.7 kg   SpO2 97%   BMI 38.35 kg/m  Gen:   Awake, no distress   Resp:  Normal effort  MSK:   Moves extremities without difficulty, edema with area of abscess formation over the dorsal aspect of the left hand Other:    Medical Decision Making  Medically screening exam initiated at 12:30 PM.  Appropriate orders placed.  Jeffey Janssen was informed that the remainder of the evaluation will be completed by another provider, this initial triage assessment does not replace that evaluation, and the importance of remaining in the ED until their evaluation is complete.  Workup initiated to include bloodwork and imaging   Lelon Perla, PA-C 08/21/23 1232

## 2023-08-22 ENCOUNTER — Encounter (INDEPENDENT_AMBULATORY_CARE_PROVIDER_SITE_OTHER): Payer: Self-pay

## 2023-08-22 ENCOUNTER — Ambulatory Visit (INDEPENDENT_AMBULATORY_CARE_PROVIDER_SITE_OTHER): Payer: 59 | Admitting: *Deleted

## 2023-08-22 ENCOUNTER — Other Ambulatory Visit (INDEPENDENT_AMBULATORY_CARE_PROVIDER_SITE_OTHER): Payer: Self-pay | Admitting: Primary Care

## 2023-08-22 ENCOUNTER — Telehealth (HOSPITAL_COMMUNITY): Payer: Self-pay | Admitting: Emergency Medicine

## 2023-08-22 ENCOUNTER — Other Ambulatory Visit: Payer: Self-pay

## 2023-08-22 DIAGNOSIS — Z0000 Encounter for general adult medical examination without abnormal findings: Secondary | ICD-10-CM

## 2023-08-22 DIAGNOSIS — Z760 Encounter for issue of repeat prescription: Secondary | ICD-10-CM

## 2023-08-22 DIAGNOSIS — E119 Type 2 diabetes mellitus without complications: Secondary | ICD-10-CM

## 2023-08-22 LAB — BLOOD CULTURE ID PANEL (REFLEXED) - BCID2

## 2023-08-22 MED ORDER — TRULICITY 1.5 MG/0.5ML ~~LOC~~ SOAJ
1.5000 mg | SUBCUTANEOUS | 0 refills | Status: DC
  Filled 2023-08-22: qty 2, 28d supply, fill #0
  Filled 2023-10-01: qty 4, 56d supply, fill #1

## 2023-08-22 NOTE — Telephone Encounter (Signed)
Requested Prescriptions  Pending Prescriptions Disp Refills   Dulaglutide (TRULICITY) 1.5 MG/0.5ML SOAJ 6 mL 0    Sig: Inject 1.5 mg into the skin once a week.     Endocrinology:  Diabetes - GLP-1 Receptor Agonists Passed - 08/22/2023  4:40 PM      Passed - HBA1C is between 0 and 7.9 and within 180 days    HbA1c, POC (controlled diabetic range)  Date Value Ref Range Status  04/13/2023 5.7 0.0 - 7.0 % Final   Hgb A1c MFr Bld  Date Value Ref Range Status  07/13/2023 5.8 (H) 4.8 - 5.6 % Final    Comment:             Prediabetes: 5.7 - 6.4          Diabetes: >6.4          Glycemic control for adults with diabetes: <7.0          Passed - Valid encounter within last 6 months    Recent Outpatient Visits           1 month ago Type 2 diabetes mellitus without complication, with long-term current use of insulin (HCC)   [place] [facility] Grayce Sessions, NP   4 months ago Type 2 diabetes mellitus without complication, with long-term current use of insulin (HCC)   [place] [facility] Grayce Sessions, NP   7 months ago Hospital discharge follow-up   [place] [facility] Grayce Sessions, NP   10 months ago Type 2 diabetes mellitus without complication, with long-term current use of insulin (HCC)   [place] [facility] Grayce Sessions, NP   12 months ago Type 2 diabetes mellitus without complication, with long-term current use of insulin (HCC)   [facility] Drucilla Chalet, RPH-CPP       Future Appointments             In 1 month Randa Evens, Kinnie Scales, NP  [facility]

## 2023-08-22 NOTE — Progress Notes (Signed)
Subjective:   Spencer Robles is a 63 y.o. male who presents for Medicare Annual/Subsequent preventive examination.  Visit Complete: Virtual I connected with  Cristal Ford on 08/22/23 by a audio enabled telemedicine application and verified that I am speaking with the correct person using two identifiers.  Patient Location: Home  Provider Location: Home Office  I discussed the limitations of evaluation and management by telemedicine. The patient expressed understanding and agreed to proceed.  Vital Signs: Because this visit was a virtual/telehealth visit, some criteria may be missing or patient reported. Any vitals not documented were not able to be obtained and vitals that have been documented are patient reported.   Cardiac Risk Factors include: advanced age (>85men, >44 women);diabetes mellitus;male gender;obesity (BMI >30kg/m2)     Objective:    Today's Vitals   08/22/23 0935  PainSc: 3    There is no height or weight on file to calculate BMI.     08/22/2023    9:39 AM 08/22/2023    9:37 AM 08/21/2023   12:17 PM 03/23/2022   12:26 PM 11/12/2021    5:55 PM 08/25/2019    5:03 PM 04/16/2018    5:19 PM  Advanced Directives  Does Patient Have a Medical Advance Directive? No No No No No No No  Would patient like information on creating a medical advance directive? No - Patient declined No - Patient declined No - Patient declined No - Patient declined  No - Patient declined No - Patient declined    Current Medications (verified) Outpatient Encounter Medications as of 08/22/2023  Medication Sig   amLODipine (NORVASC) 10 MG tablet Take 1 tablet (10 mg total) by mouth daily.   aspirin EC 81 MG tablet Take 1 tablet (81 mg total) by mouth daily.   Blood Glucose Monitoring Suppl (TRUE METRIX METER) w/Device KIT Use to check blood sugar up to 3 times daily.   doxycycline (VIBRAMYCIN) 100 MG capsule Take 1 capsule (100 mg total) by mouth 2 (two) times daily for 7 days.   Dulaglutide  (TRULICITY) 1.5 MG/0.5ML SOAJ Inject 1.5 mg into the skin once a week.   glucose blood (TRUE METRIX BLOOD GLUCOSE TEST) test strip Use as instructed to check blood sugar up to 3 times daily.   ibuprofen (ADVIL) 800 MG tablet Take 800 mg by mouth every 6 (six) hours as needed for mild pain.    insulin glargine (LANTUS) 100 UNIT/ML Solostar Pen Inject 12 Units into the skin daily.   Insulin Pen Needle (TECHLITE PEN NEEDLES) 31G X 8 MM MISC Use as directed.   lisinopril (ZESTRIL) 20 MG tablet Take 1 tablet (20 mg total) by mouth daily.   metFORMIN (GLUCOPHAGE-XR) 500 MG 24 hr tablet Take 1 tablet (500 mg total) by mouth daily with breakfast.   pravastatin (PRAVACHOL) 10 MG tablet Take 1 tablet (10 mg total) by mouth daily.   TRUEplus Lancets 28G MISC Use as instructed to check blood sugar up to 3 times daily.   [DISCONTINUED] sitaGLIPtin (JANUVIA) 100 MG tablet Take 1 tablet (100 mg total) by mouth daily.   No facility-administered encounter medications on file as of 08/22/2023.    Allergies (verified) Patient has no known allergies.   History: Past Medical History:  Diagnosis Date   Diabetes mellitus without complication (HCC)    Epididymitis 08/15/2017   Hx of necrotising fasciitis 06/2017   RLE/notes 08/15/2017   Scrotal swelling    left; due to Epididymitis/notes 08/15/2017   Sleep apnea  does not use a cpap   Past Surgical History:  Procedure Laterality Date   AMPUTATION TOE Right 07/06/2017   Procedure: RIGHT GREAT TOE AND SECOND TOE AMPUTATION;  Surgeon: Nadara Mustard, MD;  Location: [facility] OR;  Service: Orthopedics;  Laterality: Right;   CIRCUMCISION     MASS BIOPSY  ~ 2008   scrotum/notes 08/15/2017   SCROTAL EXPLORATION N/A 08/21/2017   Procedure: INCISION AND DRAINAGE SCROTAL ABSCESS, CYSTOSCOPY, COMPLEX INSERTION OF URETHERAL FOLEY CATHETER;  Surgeon: Heloise Purpura, MD;  Location: [facility] OR;  Service: Urology;  Laterality: N/A;   SCROTAL EXPLORATION N/A 08/30/2017   Procedure:  SCROTUM DEBRIDEMENT;  Surgeon: Heloise Purpura, MD;  Location: [facility] ORS;  Service: Urology;  Laterality: N/A;   History reviewed. No pertinent family history. Social History   Socioeconomic History   Marital status: Married    Spouse name: Not on file   Number of children: Not on file   Years of education: Not on file   Highest education level: Not on file  Occupational History   Not on file  Tobacco Use   Smoking status: Never   Smokeless tobacco: Never  Vaping Use   Vaping status: Never Used  Substance and Sexual Activity   Alcohol use: No    Alcohol/week: 0.0 standard drinks of alcohol   Drug use: No   Sexual activity: Not Currently  Other Topics Concern   Not on file  Social History Narrative   ** Merged History Encounter **       Social Drivers of Health   Financial Resource Strain: Low Risk  (08/22/2023)   Overall Financial Resource Strain (CARDIA)    Difficulty of Paying Living Expenses: Not hard at all  Food Insecurity: No Food Insecurity (08/22/2023)   Hunger Vital Sign    Worried About Running Out of Food in the Last Year: Never true    Ran Out of Food in the Last Year: Never true  Transportation Needs: No Transportation Needs (08/22/2023)   PRAPARE - Administrator, Civil Service (Medical): No    Lack of Transportation (Non-Medical): No  Physical Activity: Sufficiently Active (08/22/2023)   Exercise Vital Sign    Days of Exercise per Week: 4 days    Minutes of Exercise per Session: 40 min  Stress: No Stress Concern Present (08/22/2023)   [employer] of Occupational Health - Occupational Stress Questionnaire    Feeling of Stress : Not at all  Social Connections: Socially Integrated (08/22/2023)   Social Connection and Isolation Panel [NHANES]    Frequency of Communication with Friends and Family: More than three times a week    Frequency of Social Gatherings with Friends and Family: Twice a week    Attends Religious Services: More than 4 times  per year    Active Member of [employer] or Organizations: Yes    Attends Banker Meetings: Never    Marital Status: Married    Tobacco Counseling Counseling given: Not Answered   Clinical Intake:  Pre-visit preparation completed: Yes  Pain : 0-10 Pain Score: 3  Pain Type: Acute pain Pain Location: Hand Pain Orientation: Left Pain Descriptors / Indicators: Aching, Dull Pain Onset: 1 to 4 weeks ago Pain Frequency: Intermittent     Diabetes: Yes CBG done?: No Did pt. bring in CBG monitor from home?: No  How often do you need to have someone help you when you read instructions, pamphlets, or other written materials from your doctor or pharmacy?: 1 -  Never  Interpreter Needed?: No  Information entered by :: Remi Haggard LPN   Activities of Daily Living    08/22/2023    9:44 AM 08/22/2023    9:39 AM  In your present state of health, do you have any difficulty performing the following activities:  Hearing? 0 0  Vision? 0   Difficulty concentrating or making decisions? 0   Walking or climbing stairs? 0   Dressing or bathing? 0   Doing errands, shopping? 0   Preparing Food and eating ? N   Using the Toilet? N   In the past six months, have you accidently leaked urine? N   Do you have problems with loss of bowel control? N   Managing your Medications? N   Managing your Finances? N   Housekeeping or managing your Housekeeping? N     Patient Care Team: Grayce Sessions, NP as PCP - General (Internal Medicine) Denny Levy (Physician Assistant)  Indicate any recent Medical Services you may have received from other than [facility] providers in the past year (date may be approximate).     Assessment:   This is a routine wellness examination for Jhalen.  Hearing/Vision screen Hearing Screening - Comments:: No trouble hearing Vision Screening - Comments:: Not up to date Unsure of name   Goals Addressed             This Visit's Progress     Patient Stated       Would like to stay active       Depression Screen    08/22/2023    9:43 AM 07/13/2023    9:25 AM 04/13/2023    9:33 AM 01/12/2023   10:09 AM 10/13/2022    9:14 AM 07/13/2022   10:38 AM 03/03/2022    1:07 PM  PHQ 2/9 Scores  PHQ - 2 Score 0 0 0 0 0 0 0  PHQ- 9 Score 0  0   0     Fall Risk    08/22/2023    9:50 AM 01/12/2023   10:09 AM 10/13/2022    9:14 AM 07/13/2022   10:14 AM 03/03/2022    1:10 PM  Fall Risk   Falls in the past year? 0 0 0 0 0  Number falls in past yr: 0 0 0 0 0  Injury with Fall? 0 0 0 0 0  Risk for fall due to :  No Fall Risks No Fall Risks  No Fall Risks  Follow up Falls evaluation completed;Education provided;Falls prevention discussed   Falls evaluation completed Falls evaluation completed    MEDICARE RISK AT HOME: Medicare Risk at Home Any stairs in or around the home?: No If so, are there any without handrails?: No Home free of loose throw rugs in walkways, pet beds, electrical cords, etc?: Yes Adequate lighting in your home to reduce risk of falls?: Yes Life alert?: No Use of a cane, walker or w/c?: No Grab bars in the bathroom?: No Shower chair or bench in shower?: No Elevated toilet seat or a handicapped toilet?: No  TIMED UP AND GO:  Was the test performed?  No    Cognitive Function:        08/22/2023    9:39 AM 03/03/2022    1:11 PM  6CIT Screen  What Year? 0 points 0 points  What month? 0 points 0 points  What time? 0 points 0 points  Count back from 20 0 points 0 points  Months  in reverse 0 points 0 points  Repeat phrase 0 points 4 points  Total Score 0 points 4 points    Immunizations Immunization History  Administered Date(s) Administered   Influenza,inj,Quad PF,6+ Mos 04/15/2015   Pneumococcal Polysaccharide-23 01/08/2018   Tdap 06/05/2018, 06/09/2021    TDAP status: Up to date  Flu Vaccine status: Due, Education has been provided regarding the importance of this vaccine. Advised may receive  this vaccine at local pharmacy or Health Dept. Aware to provide a copy of the vaccination record if obtained from local pharmacy or Health Dept. Verbalized acceptance and understanding.  Pneumococcal vaccine status: Due, Education has been provided regarding the importance of this vaccine. Advised may receive this vaccine at local pharmacy or Health Dept. Aware to provide a copy of the vaccination record if obtained from local pharmacy or Health Dept. Verbalized acceptance and understanding.  Covid-19 vaccine status: Information provided on how to obtain vaccines.   Qualifies for Shingles Vaccine? Yes   Zostavax completed No   Shingrix Completed?: No.    Education has been provided regarding the importance of this vaccine. Patient has been advised to call insurance company to determine out of pocket expense if they have not yet received this vaccine. Advised may also receive vaccine at local pharmacy or Health Dept. Verbalized acceptance and understanding.  Screening Tests Health Maintenance  Topic Date Due   Zoster Vaccines- Shingrix (1 of 2) Never done   Pneumococcal Vaccine 33-61 Years old (2 of 2 - PCV) 01/09/2019   COVID-19 Vaccine (1 - 2024-25 season) Never done   INFLUENZA VACCINE  10/22/2023 (Originally 02/22/2023)   OPHTHALMOLOGY EXAM  08/31/2023   Diabetic kidney evaluation - Urine ACR  10/13/2023   HEMOGLOBIN A1C  01/11/2024   FOOT EXAM  01/12/2024   Diabetic kidney evaluation - eGFR measurement  08/20/2024   Medicare Annual Wellness (AWV)  08/21/2024   DTaP/Tdap/Td (3 - Td or Tdap) 06/10/2031   Colonoscopy  11/10/2031   Hepatitis C Screening  Completed   HIV Screening  Completed   HPV VACCINES  Aged Out    Health Maintenance  Health Maintenance Due  Topic Date Due   Zoster Vaccines- Shingrix (1 of 2) Never done   Pneumococcal Vaccine 38-65 Years old (2 of 2 - PCV) 01/09/2019   COVID-19 Vaccine (1 - 2024-25 season) Never done    Colorectal cancer screening: Type of  screening: Colonoscopy. Completed 2023. Repeat every 2 years  Lung Cancer Screening: (Low Dose CT Chest recommended if Age 65-80 years, 20 pack-year currently smoking OR have quit w/in 15years.) does not qualify.   Lung Cancer Screening Referral:    Additional Screening:  Hepatitis C Screening:Completed   2019  Vision Screening: Recommended annual ophthalmology exams for early detection of glaucoma and other disorders of the eye. Is the patient up to date with their annual eye exam?  Yes  Who is the provider or what is the name of the office in which the patient attends annual eye exams? Unsure of name If pt is not established with a provider, would they like to be referred to a provider to establish care? No .   Dental Screening: Recommended annual dental exams for proper oral hygiene  Nutrition Risk Assessment:  Has the patient had any N/V/D within the last 2 months?  No  Does the patient have any non-healing wounds?  No  has some wounds but they are healing.  Patient is on Doxycycline Has the patient had any unintentional weight  loss or weight gain?  No   Diabetes:  Is the patient diabetic?  Yes  If diabetic, was a CBG obtained today?  No  Did the patient bring in their glucometer from home?  No  How often do you monitor your CBG's? 1 x a month.   Financial Strains and Diabetes Management:  Are you having any financial strains with the device, your supplies or your medication? No .  Does the patient want to be seen by Chronic Care Management for management of their diabetes?  No  Would the patient like to be referred to a Nutritionist or for Diabetic Management?  No   Diabetic Exams:  Diabetic Eye Exam: Completed . Pt has been advised about the importance in completing this exam.  Diabetic Foot Exam: . Pt has been advised about the importance in completing this exam. .    Community Resource Referral / Chronic Care Management: CRR required this visit?  No   CCM  required this visit?  No     Plan:     I have personally reviewed and noted the following in the patient's chart:   Medical and social history Use of alcohol, tobacco or illicit drugs  Current medications and supplements including opioid prescriptions. Patient is not currently taking opioid prescriptions. Functional ability and status Nutritional status Physical activity Advanced directives List of other physicians Hospitalizations, surgeries, and ER visits in previous 12 months Vitals Screenings to include cognitive, depression, and falls Referrals and appointments  In addition, I have reviewed and discussed with patient certain preventive protocols, quality metrics, and best practice recommendations. A written personalized care plan for preventive services as well as general preventive health recommendations were provided to patient.     Remi Haggard, LPN   [id number]   After Visit Summary: (MyChart) Due to this being a telephonic visit, the after visit summary with patients personalized plan was offered to patient via MyChart   Nurse Notes:

## 2023-08-22 NOTE — Patient Instructions (Signed)
[name] , Thank you for taking time to come for your Medicare Wellness Visit. I appreciate your ongoing commitment to your health goals. Please review the following plan we discussed and let me know if I can assist you in the future.   Screening recommendations/referrals: Colonoscopy: up to date Recommended yearly ophthalmology/optometry visit for glaucoma screening and checkup Recommended yearly dental visit for hygiene and checkup  Vaccinations: Influenza vaccine: Education provided Pneumococcal vaccine: Education provided Tdap vaccine: up to date Shingles vaccine: Education provided    Advanced directives: Education provided  Preventive Care 40-64 Years, Male Preventive care refers to lifestyle choices and visits with your health care provider that can promote health and wellness. What does preventive care include? A yearly physical exam. This is also called an annual well check. Dental exams once or twice a year. Routine eye exams. Ask your health care provider how often you should have your eyes checked. Personal lifestyle choices, including: Daily care of your teeth and gums. Regular physical activity. Eating a healthy diet. Avoiding tobacco and drug use. Limiting alcohol use. Practicing safe sex. Taking low-dose aspirin every day starting at age 5. What happens during an annual well check? The services and screenings done by your health care provider during your annual well check will depend on your age, overall health, lifestyle risk factors, and family history of disease. Counseling  Your health care provider may ask you questions about your: Alcohol use. Tobacco use. Drug use. Emotional well-being. Home and relationship well-being. Sexual activity. Eating habits. Work and work Astronomer. Screening  You may have the following tests or measurements: Height, weight, and BMI. Blood pressure. Lipid and cholesterol levels. These may be checked every 5 years, or  more frequently if you are over 19 years old. Skin check. Lung cancer screening. You may have this screening every year starting at age 71 if you have a 30-pack-year history of smoking and currently smoke or have quit within the past 15 years. Fecal occult blood test (FOBT) of the stool. You may have this test every year starting at age 27. Flexible sigmoidoscopy or colonoscopy. You may have a sigmoidoscopy every 5 years or a colonoscopy every 10 years starting at age 25. Prostate cancer screening. Recommendations will vary depending on your family history and other risks. Hepatitis C blood test. Hepatitis B blood test. Sexually transmitted disease (STD) testing. Diabetes screening. This is done by checking your blood sugar (glucose) after you have not eaten for a while (fasting). You may have this done every 1-3 years. Discuss your test results, treatment options, and if necessary, the need for more tests with your health care provider. Vaccines  Your health care provider may recommend certain vaccines, such as: Influenza vaccine. This is recommended every year. Tetanus, diphtheria, and acellular pertussis (Tdap, Td) vaccine. You may need a Td booster every 10 years. Zoster vaccine. You may need this after age 77. Pneumococcal 13-valent conjugate (PCV13) vaccine. You may need this if you have certain conditions and have not been vaccinated. Pneumococcal polysaccharide (PPSV23) vaccine. You may need one or two doses if you smoke cigarettes or if you have certain conditions. Talk to your health care provider about which screenings and vaccines you need and how often you need them. This information is not intended to replace advice given to you by your health care provider. Make sure you discuss any questions you have with your health care provider. Document Released: 08/06/2015 Document Revised: 03/29/2016 Document Reviewed: 05/11/2015 Elsevier Interactive Patient Education  2017 Elsevier  Inc.  Fall Prevention in the Home Falls can cause injuries. They can happen to people of all ages. There are many things you can do to make your home safe and to help prevent falls. What can I do on the outside of my home? Regularly fix the edges of walkways and driveways and fix any cracks. Remove anything that might make you trip as you walk through a door, such as a raised step or threshold. Trim any bushes or trees on the path to your home. Use bright outdoor lighting. Clear any walking paths of anything that might make someone trip, such as rocks or tools. Regularly check to see if handrails are loose or broken. Make sure that both sides of any steps have handrails. Any raised decks and porches should have guardrails on the edges. Have any leaves, snow, or ice cleared regularly. Use sand or salt on walking paths during winter. Clean up any spills in your garage right away. This includes oil or grease spills. What can I do in the bathroom? Use night lights. Install grab bars by the toilet and in the tub and shower. Do not use towel bars as grab bars. Use non-skid mats or decals in the tub or shower. If you need to sit down in the shower, use a plastic, non-slip stool. Keep the floor dry. Clean up any water that spills on the floor as soon as it happens. Remove soap buildup in the tub or shower regularly. Attach bath mats securely with double-sided non-slip rug tape. Do not have throw rugs and other things on the floor that can make you trip. What can I do in the bedroom? Use night lights. Make sure that you have a light by your bed that is easy to reach. Do not use any sheets or blankets that are too big for your bed. They should not hang down onto the floor. Have a firm chair that has side arms. You can use this for support while you get dressed. Do not have throw rugs and other things on the floor that can make you trip. What can I do in the kitchen? Clean up any spills right  away. Avoid walking on wet floors. Keep items that you use a lot in easy-to-reach places. If you need to reach something above you, use a strong step stool that has a grab bar. Keep electrical cords out of the way. Do not use floor polish or wax that makes floors slippery. If you must use wax, use non-skid floor wax. Do not have throw rugs and other things on the floor that can make you trip. What can I do with my stairs? Do not leave any items on the stairs. Make sure that there are handrails on both sides of the stairs and use them. Fix handrails that are broken or loose. Make sure that handrails are as long as the stairways. Check any carpeting to make sure that it is firmly attached to the stairs. Fix any carpet that is loose or worn. Avoid having throw rugs at the top or bottom of the stairs. If you do have throw rugs, attach them to the floor with carpet tape. Make sure that you have a light switch at the top of the stairs and the bottom of the stairs. If you do not have them, ask someone to add them for you. What else can I do to help prevent falls? Wear shoes that: Do not have high heels. Have rubber bottoms.  Are comfortable and fit you well. Are closed at the toe. Do not wear sandals. If you use a stepladder: Make sure that it is fully opened. Do not climb a closed stepladder. Make sure that both sides of the stepladder are locked into place. Ask someone to hold it for you, if possible. Clearly mark and make sure that you can see: Any grab bars or handrails. First and last steps. Where the edge of each step is. Use tools that help you move around (mobility aids) if they are needed. These include: Canes. Walkers. Scooters. Crutches. Turn on the lights when you go into a dark area. Replace any light bulbs as soon as they burn out. Set up your furniture so you have a clear path. Avoid moving your furniture around. If any of your floors are uneven, fix them. If there are any  pets around you, be aware of where they are. Review your medicines with your doctor. Some medicines can make you feel dizzy. This can increase your chance of falling. Ask your doctor what other things that you can do to help prevent falls. This information is not intended to replace advice given to you by your health care provider. Make sure you discuss any questions you have with your health care provider. Document Released: 05/06/2009 Document Revised: 12/16/2015 Document Reviewed: 08/14/2014 Elsevier Interactive Patient Education  2017 [employer].

## 2023-08-22 NOTE — Telephone Encounter (Signed)
Asked by nursing staff to review blood culture.  Patient with 1 bottle containing Staph epidermidis.  Likely contaminant.  No antibiotics indicated.

## 2023-08-23 ENCOUNTER — Other Ambulatory Visit: Payer: Self-pay

## 2023-08-23 LAB — CULTURE, BLOOD (ROUTINE X 2)

## 2023-08-24 ENCOUNTER — Other Ambulatory Visit: Payer: Self-pay

## 2023-08-26 LAB — CULTURE, BLOOD (ROUTINE X 2)
Culture: NO GROWTH
Special Requests: ADEQUATE

## 2023-08-27 ENCOUNTER — Other Ambulatory Visit: Payer: Self-pay

## 2023-09-25 ENCOUNTER — Ambulatory Visit (INDEPENDENT_AMBULATORY_CARE_PROVIDER_SITE_OTHER): Payer: 59 | Admitting: Primary Care

## 2023-09-25 ENCOUNTER — Encounter (INDEPENDENT_AMBULATORY_CARE_PROVIDER_SITE_OTHER): Payer: Self-pay | Admitting: Primary Care

## 2023-09-25 VITALS — BP 134/80 | HR 50 | Temp 98.6°F | Resp 14 | Ht 71.0 in | Wt 279.0 lb

## 2023-09-25 DIAGNOSIS — Z09 Encounter for follow-up examination after completed treatment for conditions other than malignant neoplasm: Secondary | ICD-10-CM

## 2023-09-25 DIAGNOSIS — L0291 Cutaneous abscess, unspecified: Secondary | ICD-10-CM | POA: Diagnosis not present

## 2023-09-25 NOTE — Patient Instructions (Signed)
 Follow up with [facility] (Plastic Surgery) Follow up with Mack Hook, MD (Orthopedic Surgery)

## 2023-09-25 NOTE — Progress Notes (Signed)
 Subjective:   Spencer Robles is a 63 y.o. male presents for ED discharge.  Patient presented to the ED on 08/21/2023 with a left hand abscess that had been present for 5 days.  He tried using herbal remedies that did not help.  He questions if he was bit by a spider.  Today he has no headaches or neck rigidity he remains concerned about the area on his hand that is still swollen.  Today there is no drainage present this eschar healing no signs and symptoms of infection that we discussed.  He was discharged with a diagnosis of abscess and treated with antibiotic Past Medical History:  Diagnosis Date   Diabetes mellitus without complication (HCC)    Epididymitis 08/15/2017   Hx of necrotising fasciitis 06/2017   RLE/notes 08/15/2017   Scrotal swelling    left; due to Epididymitis/notes 08/15/2017   Sleep apnea    does not use a cpap     No Known Allergies  Current Outpatient Medications on File Prior to Visit  Medication Sig Dispense Refill   amLODipine (NORVASC) 10 MG tablet Take 1 tablet (10 mg total) by mouth daily. 90 tablet 1   aspirin EC 81 MG tablet Take 1 tablet (81 mg total) by mouth daily. 90 tablet 0   Blood Glucose Monitoring Suppl (TRUE METRIX METER) w/Device KIT Use to check blood sugar up to 3 times daily. 1 kit 0   Dulaglutide (TRULICITY) 1.5 MG/0.5ML SOAJ Inject 1.5 mg into the skin once a week. 6 mL 0   glucose blood (TRUE METRIX BLOOD GLUCOSE TEST) test strip Use as instructed to check blood sugar up to 3 times daily. 100 each 11   ibuprofen (ADVIL) 800 MG tablet Take 800 mg by mouth every 6 (six) hours as needed for mild pain.      insulin glargine (LANTUS) 100 UNIT/ML Solostar Pen Inject 12 Units into the skin daily. 15 mL 3   Insulin Pen Needle (TECHLITE PEN NEEDLES) 31G X 8 MM MISC Use as directed. 100 each 33   lisinopril (ZESTRIL) 20 MG tablet Take 1 tablet (20 mg total) by mouth daily. 90 tablet 1   metFORMIN (GLUCOPHAGE-XR) 500 MG 24 hr tablet Take 1 tablet (500  mg total) by mouth daily with breakfast. 90 tablet 1   pravastatin (PRAVACHOL) 10 MG tablet Take 1 tablet (10 mg total) by mouth daily. 90 tablet 1   TRUEplus Lancets 28G MISC Use as instructed to check blood sugar up to 3 times daily. 100 each 11   [DISCONTINUED] sitaGLIPtin (JANUVIA) 100 MG tablet Take 1 tablet (100 mg total) by mouth daily. 90 tablet 1   No current facility-administered medications on file prior to visit.    Review of System: ROS Comprehensive ROS Pertinent positive and negative noted in HPI   Objective:  BP 134/80 (BP Location: Left Arm, Patient Position: Sitting, Cuff Size: Large)   Pulse (!) 50   Temp 98.6 F (37 C) (Oral)   Resp 14   Ht 5\' 11"  (1.803 m)   Wt 279 lb (126.6 kg)   SpO2 99%   BMI 38.91 kg/m   Filed Weights   09/25/23 1440  Weight: 279 lb (126.6 kg)    Physical Exam Vitals reviewed.  Constitutional:      Appearance: Normal appearance. He is obese.  HENT:     Right Ear: External ear normal.     Left Ear: External ear normal.  Eyes:     Extraocular Movements:  Extraocular movements intact.  Musculoskeletal:        General: Normal range of motion.  Skin:    Comments: See picture of hand  Neurological:     Mental Status: He is oriented to person, place, and time.  Psychiatric:        Mood and Affect: Mood normal.        Behavior: Behavior normal.      Assessment:  Spencer Robles was seen today for diabetes.  Diagnoses and all orders for this visit:  Hospital discharge follow-up See HPI  Abscess  Place instructions on AVS for plastic surgery and orthopedics  This note has been created with Dragon speech recognition software and smart Lobbyist. Any transcriptional errors are unintentional.     Grayce Sessions, NP 09/25/2023, 2:47 PM

## 2023-09-28 ENCOUNTER — Other Ambulatory Visit (INDEPENDENT_AMBULATORY_CARE_PROVIDER_SITE_OTHER): Payer: Self-pay | Admitting: Primary Care

## 2023-09-28 ENCOUNTER — Other Ambulatory Visit: Payer: Self-pay

## 2023-09-28 DIAGNOSIS — Z794 Long term (current) use of insulin: Secondary | ICD-10-CM

## 2023-09-28 DIAGNOSIS — I10 Essential (primary) hypertension: Secondary | ICD-10-CM

## 2023-09-28 DIAGNOSIS — Z760 Encounter for issue of repeat prescription: Secondary | ICD-10-CM

## 2023-09-28 MED ORDER — AMLODIPINE BESYLATE 10 MG PO TABS
10.0000 mg | ORAL_TABLET | Freq: Every day | ORAL | 1 refills | Status: DC
  Filled 2023-09-28: qty 90, 90d supply, fill #0
  Filled 2023-12-28: qty 90, 90d supply, fill #1

## 2023-09-28 MED ORDER — LISINOPRIL 20 MG PO TABS
20.0000 mg | ORAL_TABLET | Freq: Every day | ORAL | 1 refills | Status: DC
  Filled 2023-09-28: qty 90, 90d supply, fill #0
  Filled 2023-12-28: qty 90, 90d supply, fill #1

## 2023-09-28 MED ORDER — METFORMIN HCL ER 500 MG PO TB24
500.0000 mg | ORAL_TABLET | Freq: Every day | ORAL | 1 refills | Status: DC
  Filled 2023-09-28: qty 90, 90d supply, fill #0
  Filled 2023-12-28: qty 90, 90d supply, fill #1

## 2023-09-28 NOTE — Telephone Encounter (Signed)
 Requested Prescriptions  Pending Prescriptions Disp Refills   amLODipine (NORVASC) 10 MG tablet 90 tablet 1    Sig: Take 1 tablet (10 mg total) by mouth daily.     Cardiovascular: Calcium Channel Blockers 2 Passed - 09/28/2023  3:40 PM      Passed - Last BP in normal range    BP Readings from Last 1 Encounters:  09/25/23 134/80         Passed - Last Heart Rate in normal range    Pulse Readings from Last 1 Encounters:  09/25/23 (!) 50         Passed - Valid encounter within last 6 months    Recent Outpatient Visits           3 days ago    [place] [facility] Grayce Sessions, NP   2 months ago Type 2 diabetes mellitus without complication, with long-term current use of insulin (HCC)   [place] [facility] Grayce Sessions, NP   5 months ago Type 2 diabetes mellitus without complication, with long-term current use of insulin (HCC)   [place] [facility] Grayce Sessions, NP   8 months ago Hospital discharge follow-up   [place] [facility] Grayce Sessions, NP   11 months ago Type 2 diabetes mellitus without complication, with long-term current use of insulin (HCC)   [place] [facility] Grayce Sessions, NP               metFORMIN (GLUCOPHAGE-XR) 500 MG 24 hr tablet 90 tablet 1    Sig: Take 1 tablet (500 mg total) by mouth daily with breakfast.     Endocrinology:  Diabetes - Biguanides Failed - 09/28/2023  3:40 PM      Failed - B12 Level in normal range and within 720 days    Vitamin B-12  Date Value Ref Range Status  05/08/2018 571 232 - 1,245 pg/mL Final         Passed - Cr in normal range and within 360 days    Creat  Date Value Ref Range Status  08/17/2015 0.92 0.70 - 1.33 mg/dL Final   Creatinine, Ser  Date Value Ref Range Status  08/21/2023 1.05 0.61 - 1.24 mg/dL Final   Creatinine, Urine  Date Value Ref Range Status  08/16/2017 142.74 mg/dL Final          Passed - HBA1C is between 0 and 7.9 and within 180 days    HbA1c, POC (controlled diabetic range)  Date Value Ref Range Status  04/13/2023 5.7 0.0 - 7.0 % Final   Hgb A1c MFr Bld  Date Value Ref Range Status  07/13/2023 5.8 (H) 4.8 - 5.6 % Final    Comment:             Prediabetes: 5.7 - 6.4          Diabetes: >6.4          Glycemic control for adults with diabetes: <7.0          Passed - eGFR in normal range and within 360 days    GFR, Est African American  Date Value Ref Range Status  08/17/2015 >89 >=60 mL/min Final   GFR calc Af Amer  Date Value Ref Range Status  04/14/2020 92 >59 mL/min/1.73 Final    Comment:    **[facility] currently reports eGFR in compliance with the current**   recommendations of the [employer].  [facility] will   update reporting as new guidelines are published from the NKF-ASN   Task force.    GFR, Est Non African American  Date Value Ref Range Status  08/17/2015 >89 >=60 mL/min Final    Comment:      The estimated GFR is a calculation valid for adults (>=8 years old) that uses the CKD-EPI algorithm to adjust for age and sex. It is   not to be used for children, pregnant women, hospitalized patients,    patients on dialysis, or with rapidly changing kidney function. According to the NKDEP, eGFR >89 is normal, 60-89 shows mild impairment, 30-59 shows moderate impairment, 15-29 shows severe impairment and <15 is ESRD.      GFR, Estimated  Date Value Ref Range Status  08/21/2023 >60 >60 mL/min Final    Comment:    (NOTE) Calculated using the CKD-EPI Creatinine Equation (2021)    eGFR  Date Value Ref Range Status  07/13/2023 73 >59 mL/min/1.73 Final         Passed - Valid encounter within last 6 months    Recent Outpatient Visits           3 days ago    [place] [facility] Grayce Sessions, NP   2 months ago Type 2 diabetes mellitus without complication, with long-term current use of  insulin (HCC)   [place] [facility] Grayce Sessions, NP   5 months ago Type 2 diabetes mellitus without complication, with long-term current use of insulin (HCC)   [place] [facility] Grayce Sessions, NP   8 months ago Hospital discharge follow-up   [place] [facility] Grayce Sessions, NP   11 months ago Type 2 diabetes mellitus without complication, with long-term current use of insulin (HCC)    [facility] Grayce Sessions, NP              Passed - CBC within normal limits and completed in the last 12 months    WBC  Date Value Ref Range Status  08/21/2023 10.5 4.0 - 10.5 K/uL Final   RBC  Date Value Ref Range Status  08/21/2023 4.70 4.22 - 5.81 MIL/uL Final   Hemoglobin  Date Value Ref Range Status  08/21/2023 13.4 13.0 - 17.0 g/dL Final  84/69/6295 28.4 13.0 - 17.7 g/dL Final   HCT  Date Value Ref Range Status  08/21/2023 40.4 39.0 - 52.0 % Final   Hematocrit  Date Value Ref Range Status  07/13/2023 40.1 37.5 - 51.0 % Final   MCHC  Date Value Ref Range Status  08/21/2023 33.2 30.0 - 36.0 g/dL Final   [facility]  Date Value Ref Range Status  08/21/2023 28.5 26.0 - 34.0 pg Final   MCV  Date Value Ref Range Status  08/21/2023 86.0 80.0 - 100.0 fL Final  07/13/2023 86 79 - 97 fL Final   No results found for: "PLTCOUNTKUC", "LABPLAT", "POCPLA" RDW  Date Value Ref Range Status  08/21/2023 13.2 11.5 - 15.5 % Final  07/13/2023 13.7 11.6 - 15.4 % Final          lisinopril (ZESTRIL) 20 MG tablet 90 tablet 1    Sig: Take 1 tablet (20 mg total) by mouth daily.     Cardiovascular:  ACE Inhibitors Passed - 09/28/2023  3:40 PM      Passed - Cr in normal range and within 180 days    Creat  Date  Value Ref Range Status  08/17/2015 0.92 0.70 - 1.33 mg/dL Final   Creatinine, Ser  Date Value Ref Range Status  08/21/2023 1.05 0.61 - 1.24 mg/dL Final   Creatinine, Urine   Date Value Ref Range Status  08/16/2017 142.74 mg/dL Final         Passed - K in normal range and within 180 days    Potassium  Date Value Ref Range Status  08/21/2023 4.4 3.5 - 5.1 mmol/L Final         Passed - Patient is not pregnant      Passed - Last BP in normal range    BP Readings from Last 1 Encounters:  09/25/23 134/80         Passed - Valid encounter within last 6 months    Recent Outpatient Visits           3 days ago    [place] [facility] Grayce Sessions, NP   2 months ago Type 2 diabetes mellitus without complication, with long-term current use of insulin (HCC)   [place] [facility] Grayce Sessions, NP   5 months ago Type 2 diabetes mellitus without complication, with long-term current use of insulin (HCC)   [place] [facility] Grayce Sessions, NP   8 months ago Hospital discharge follow-up   [place] [facility] Grayce Sessions, NP   11 months ago Type 2 diabetes mellitus without complication, with long-term current use of insulin (HCC)   [place] [facility] Grayce Sessions, NP               Dulaglutide (TRULICITY) 1.5 MG/0.5ML SOAJ 6 mL 0    Sig: Inject 1.5 mg into the skin once a week.     Endocrinology:  Diabetes - GLP-1 Receptor Agonists Passed - 09/28/2023  3:40 PM      Passed - HBA1C is between 0 and 7.9 and within 180 days    HbA1c, POC (controlled diabetic range)  Date Value Ref Range Status  04/13/2023 5.7 0.0 - 7.0 % Final   Hgb A1c MFr Bld  Date Value Ref Range Status  07/13/2023 5.8 (H) 4.8 - 5.6 % Final    Comment:             Prediabetes: 5.7 - 6.4          Diabetes: >6.4          Glycemic control for adults with diabetes: <7.0          Passed - Valid encounter within last 6 months    Recent Outpatient Visits           3 days ago    [place] [facility] Grayce Sessions, NP   2 months  ago Type 2 diabetes mellitus without complication, with long-term current use of insulin (HCC)   [place] [facility] Grayce Sessions, NP   5 months ago Type 2 diabetes mellitus without complication, with long-term current use of insulin [facility]   [place] [facility] Grayce Sessions, NP   8 months ago Hospital discharge follow-up   [place] [facility] Grayce Sessions, NP   11 months ago Type 2 diabetes mellitus without complication, with long-term current use of insulin [facility])    [facility] Grayce Sessions, NP

## 2023-09-28 NOTE — Telephone Encounter (Signed)
 Copied from CRM [id number]. Topic: Clinical - Medication Refill >> Sep 28, 2023  9:16 AM Everette C wrote: Most Recent Primary Care Visit:  Provider: Grayce Sessions  Department: [facility]  Visit Type: OFFICE VISIT  Date: 09/25/2023  Medication: Rx #: [id number]  Dulaglutide (TRULICITY) 1.5 MG/0.5ML SOAJ [[id number]]   Rx #: [id number]  metFORMIN (GLUCOPHAGE-XR) 500 MG 24 hr tablet [[id number]]   Rx #: [id number]  lisinopril (ZESTRIL) 20 MG tablet [[id number]]   Rx #: [id number]  amLODipine (NORVASC) 10 MG tablet [[id number]]    The patient would like a 60 day supply of the medications   Has the patient contacted their pharmacy? Yes (Agent: If no, request that the patient contact the pharmacy for the refill. If patient does not wish to contact the pharmacy document the reason why and proceed with request.) (Agent: If yes, when and what did the pharmacy advise?)  Is this the correct pharmacy for this prescription? Yes If no, delete pharmacy and type the correct one.  This is the patient's preferred pharmacy:  [facility] [address] [place] [address] Phone: [phone] Fax: [phone]  [facility] [address] [place] [address] Phone: [phone] Fax: [phone]   Has the prescription been filled recently? Yes  Is the patient out of the medication? Yes  Has the patient been seen for an appointment in the last year OR does the patient have an upcoming appointment? Yes  Can we respond through MyChart? No  Agent: Please be advised that Rx refills may take up to 3 business days. We ask that you follow-up with your pharmacy.

## 2023-10-01 ENCOUNTER — Other Ambulatory Visit (INDEPENDENT_AMBULATORY_CARE_PROVIDER_SITE_OTHER): Payer: Self-pay | Admitting: Primary Care

## 2023-10-01 ENCOUNTER — Other Ambulatory Visit: Payer: Self-pay

## 2023-10-01 DIAGNOSIS — Z760 Encounter for issue of repeat prescription: Secondary | ICD-10-CM

## 2023-10-01 DIAGNOSIS — E119 Type 2 diabetes mellitus without complications: Secondary | ICD-10-CM

## 2023-10-01 MED ORDER — TRULICITY 1.5 MG/0.5ML ~~LOC~~ SOAJ
1.5000 mg | SUBCUTANEOUS | 0 refills | Status: DC
  Filled 2023-10-01 – 2023-12-28 (×2): qty 6, 84d supply, fill #0

## 2023-10-11 ENCOUNTER — Ambulatory Visit (INDEPENDENT_AMBULATORY_CARE_PROVIDER_SITE_OTHER): Payer: Self-pay | Admitting: Primary Care

## 2023-10-12 ENCOUNTER — Ambulatory Visit (INDEPENDENT_AMBULATORY_CARE_PROVIDER_SITE_OTHER): Payer: 59 | Admitting: Primary Care

## 2023-11-13 ENCOUNTER — Ambulatory Visit (INDEPENDENT_AMBULATORY_CARE_PROVIDER_SITE_OTHER)

## 2023-11-13 ENCOUNTER — Encounter (INDEPENDENT_AMBULATORY_CARE_PROVIDER_SITE_OTHER): Payer: Self-pay

## 2023-11-22 ENCOUNTER — Other Ambulatory Visit: Payer: Self-pay

## 2023-11-23 ENCOUNTER — Other Ambulatory Visit: Payer: Self-pay

## 2023-11-23 MED ORDER — DULCOLAX 5 MG PO TBEC
DELAYED_RELEASE_TABLET | ORAL | 0 refills | Status: AC
  Filled 2023-11-23: qty 4, 1d supply, fill #0

## 2023-11-23 MED ORDER — PEG 3350-KCL-NA BICARB-NACL 420 G PO SOLR
ORAL | 0 refills | Status: AC
  Filled 2023-11-23: qty 4000, 2d supply, fill #0

## 2023-11-28 DIAGNOSIS — Z09 Encounter for follow-up examination after completed treatment for conditions other than malignant neoplasm: Secondary | ICD-10-CM | POA: Diagnosis not present

## 2023-11-28 DIAGNOSIS — D123 Benign neoplasm of transverse colon: Secondary | ICD-10-CM | POA: Diagnosis not present

## 2023-11-28 DIAGNOSIS — Z860101 Personal history of adenomatous and serrated colon polyps: Secondary | ICD-10-CM | POA: Diagnosis not present

## 2023-11-30 DIAGNOSIS — D123 Benign neoplasm of transverse colon: Secondary | ICD-10-CM | POA: Diagnosis not present

## 2023-12-28 ENCOUNTER — Other Ambulatory Visit: Payer: Self-pay

## 2024-01-10 ENCOUNTER — Telehealth (INDEPENDENT_AMBULATORY_CARE_PROVIDER_SITE_OTHER): Payer: Self-pay | Admitting: Primary Care

## 2024-01-10 NOTE — Telephone Encounter (Signed)
 Spoke to pt about upcoming appt.. Will be present

## 2024-01-11 ENCOUNTER — Ambulatory Visit (INDEPENDENT_AMBULATORY_CARE_PROVIDER_SITE_OTHER): Admitting: Primary Care

## 2024-01-11 ENCOUNTER — Encounter (INDEPENDENT_AMBULATORY_CARE_PROVIDER_SITE_OTHER): Payer: Self-pay | Admitting: Primary Care

## 2024-01-11 VITALS — BP 145/80 | HR 60 | Resp 16 | Wt 283.2 lb

## 2024-01-11 DIAGNOSIS — L602 Onychogryphosis: Secondary | ICD-10-CM | POA: Diagnosis not present

## 2024-01-11 DIAGNOSIS — E119 Type 2 diabetes mellitus without complications: Secondary | ICD-10-CM | POA: Diagnosis not present

## 2024-01-11 DIAGNOSIS — Z794 Long term (current) use of insulin: Secondary | ICD-10-CM

## 2024-01-11 DIAGNOSIS — E782 Mixed hyperlipidemia: Secondary | ICD-10-CM

## 2024-01-11 DIAGNOSIS — I10 Essential (primary) hypertension: Secondary | ICD-10-CM

## 2024-01-11 NOTE — Progress Notes (Signed)
 [facility]  Spencer Robles, is a 63 y.o. male  RDW:[id number]  FMW:[id number]  DOB - 11-Jun-1961  Chief Complaint  Patient presents with   Hypertension   Diabetes       Subjective:   Spencer Robles is a 63 y.o. male here today for a management of hypertension.  Today his blood pressure is elevated more than usual.  Informed patient would not be upset but needed to know the truth-he does admit sometimes he simply forgets.  Patient has No headache, No chest pain, No abdominal pain - No Nausea, No new weakness tingling or numbness, No Cough - shortness of breath  Type 2 Diabetes -Denies polyuria, polydipsia, polyphasia or vision changes.  Does not check blood sugars regularly. This morning he was 98 fasting   Comprehensive ROS Pertinent positive and negative noted in HPI  Title   Diabetic Foot Exam - detailed Date & Time: 01/11/2024 10:40 AM Diabetic Foot exam was performed with the following findings: Yes  Visual Foot Exam completed.: Yes  Is there a history of foot ulcer?: Yes Is there a foot ulcer now?: No Is there swelling?: No Is there elevated skin temperature?: Yes Is there abnormal foot shape?: Yes Is there a claw toe deformity?: Yes Are the toenails long?: No Are the toenails thick?: Yes Are the toenails ingrown?: No Is the skin thin, fragile, shiny and hairless?: Yes Normal Range of Motion?: Yes Is there foot or ankle muscle weakness?: Yes Do you have pain in calf while walking?: No Are the shoes appropriate in style and fit?: Yes Can the patient see the bottom of their feet?: No Pulse Foot Exam completed.: Yes   Right Dorsalis Pedis: Present Left Dorsalis Pedis: Present     Sensory Foot Exam Completed.: Yes Semmes-Weinstein Monofilament Test + means has sensation and - means no sensation   L Foot Test Control: Pos    L Site 1-Great Toe: Pos   R Site 4: Pos L Site 4: Pos   R site 5: Pos L Site 5: Pos  R Site 6: Pos L Site 6: Pos      Image components are not supported.   Image components are not supported. Image components are not supported.  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 90 tablet 1   aspirin  EC 81 MG tablet Take 1 tablet (81 mg total) by mouth daily. 90 tablet 0   bisacodyl  (DULCOLAX) 5 MG EC tablet Take 2 tablets by mouth twice a day 1 days 4 tablet 0   Blood Glucose Monitoring Suppl (TRUE METRIX METER) w/Device KIT Use to check blood sugar up to 3 times daily. 1 kit 0   Dulaglutide  (TRULICITY ) 1.5 MG/0.5ML SOAJ Inject 1.5 mg into the skin once a week. 6 mL 0   glucose blood (TRUE METRIX BLOOD GLUCOSE TEST) test strip Use as instructed to check blood sugar up to 3  times daily. 100 each 11   ibuprofen (ADVIL) 800 MG tablet Take 800 mg by mouth every 6 (six) hours as needed for mild pain.      insulin  glargine (LANTUS ) 100 UNIT/ML Solostar Pen Inject 12 Units into the skin daily. 15 mL 3   Insulin  Pen Needle (TECHLITE PEN NEEDLES) 31G X 8 MM MISC Use as directed. 100 each 33   lisinopril  (ZESTRIL ) 20 MG tablet Take 1 tablet (20 mg total) by mouth daily. 90 tablet 1   metFORMIN  (GLUCOPHAGE -XR) 500 MG 24 hr tablet Take 1 tablet (500 mg total) by mouth daily with breakfast. 90 tablet 1   polyethylene glycol-electrolytes (NULYTELY ) 420 g solution Take as directed. 4000 mL 0   pravastatin  (PRAVACHOL ) 10 MG tablet Take 1 tablet (10 mg total) by mouth daily. 90 tablet 1    TRUEplus Lancets 28G MISC Use as instructed to check blood sugar up to 3 times daily. 100 each 11   [DISCONTINUED] sitaGLIPtin  (JANUVIA ) 100 MG tablet Take 1 tablet (100 mg total) by mouth daily. 90 tablet 1   No current facility-administered medications on file prior to visit.   Health Maintenance  Topic Date Due   Zoster (Shingles) Vaccine (1 of 2) Never done   Pneumococcal Vaccination (2 of 2 - PCV) 01/09/2019   COVID-19 Vaccine (1 - 2024-25 season) Never done   Eye exam for diabetics  08/31/2023   Yearly kidney health urinalysis for diabetes  10/13/2023   Hemoglobin A1C  01/11/2024   Complete foot exam   01/12/2024   Flu Shot  02/22/2024   Yearly kidney function blood test for diabetes  08/20/2024   Medicare Annual Wellness Visit  08/21/2024   DTaP/Tdap/Td vaccine (3 - Td or Tdap) 06/10/2031   Colon Cancer Screening  11/10/2031   Hepatitis C Screening  Completed   HIV Screening  Completed   HPV Vaccine  Aged Out   Meningitis B Vaccine  Aged Out    Objective:   Vitals:   01/11/24 1019 01/11/24 1020  BP: (!) 142/89 (!) 145/80  Pulse: 60   Resp: 16   SpO2: 97%   Weight: 283 lb 3.2 oz (128.5 kg)    BP Readings from Last 3 Encounters:  01/11/24 (!) 145/80  09/25/23 134/80  08/21/23 136/80      Physical Exam Vitals reviewed.  Constitutional:      Appearance: He is obese.  HENT:     Head: Normocephalic.     Right Ear: Tympanic membrane and external ear normal.     Left Ear: Tympanic membrane and external ear normal.     Nose: Nose normal.   Eyes:     Extraocular Movements: Extraocular movements intact.     Pupils: Pupils are equal, round, and reactive to light.    Cardiovascular:     Rate and Rhythm: Normal rate and regular rhythm.  Pulmonary:     Effort: Pulmonary effort is normal.     Breath sounds: Normal breath sounds.  Abdominal:     General: Bowel sounds are normal. There is distension.     Palpations: Abdomen is soft.   Musculoskeletal:         General: Normal range of motion.   Skin:    General: Skin is warm and dry.   Neurological:     Mental Status: He is oriented to person, place, and time.   Psychiatric:        Mood and Affect: Mood normal.  Behavior: Behavior normal.        Thought Content: Thought content normal.        Judgment: Judgment normal.     Assessment & Plan  Spencer Robles was seen today for hypertension and diabetes.  Diagnoses and all orders for this visit:  Type 2 diabetes mellitus without complication, with long-term current use of insulin  (HCC)  - educated on lifestyle modifications, including but not limited to diet choices and adding exercise to daily routine.   -     CBC with Differential/Platelet -     Hemoglobin A1c -     Microalbumin / creatinine urine ratio -     Ambulatory referral to Ophthalmology -     Ambulatory referral to Podiatry  Essential hypertension BP goal - < 140/90 Explained that having normal blood pressure is the goal and medications are helping to get to goal and maintain normal blood pressure. DIET: Limit salt intake, read nutrition labels to check salt content, limit fried and high fatty foods  Avoid using multisymptom OTC cold preparations that generally contain sudafed which can rise BP. Consult with pharmacist on best cold relief products to use for persons with HTN EXERCISE Discussed incorporating exercise such as walking - 30 minutes most days of the week and can do in 10 minute intervals    -     CBC with Differential/Platelet -     CMP14+EGFR -     Comprehensive metabolic panel with GFR  Mixed hyperlipidemia -     Lipid panel  Comprehensive diabetic foot examination, type 2 DM, encounter for [facility])  Onychauxis -     Ambulatory referral to Podiatry        Patient have been counseled extensively about nutrition and exercise. Other issues discussed during this visit include: low cholesterol diet, weight control and daily exercise, foot care, annual eye  examinations at Ophthalmology, importance of adherence with medications and regular follow-up. We also discussed long term complications of uncontrolled diabetes and hypertension.   Return in about 3 months (around 04/12/2024).  The patient was given clear instructions to go to ER or return to medical center if symptoms don't improve, worsen or new problems develop. The patient verbalized understanding. The patient was told to call to get lab results if they haven't heard anything in the next week.   This note has been created with Education officer, environmental. Any transcriptional errors are unintentional.   Rosaline SHAUNNA Bohr, NP 01/11/2024, 10:33 AM

## 2024-01-12 LAB — HEMOGLOBIN A1C

## 2024-01-14 ENCOUNTER — Ambulatory Visit (INDEPENDENT_AMBULATORY_CARE_PROVIDER_SITE_OTHER): Payer: Self-pay | Admitting: Primary Care

## 2024-01-14 LAB — CMP14+EGFR
ALT: 30 IU/L (ref 0–44)
AST: 26 IU/L (ref 0–40)
Albumin: 4.5 g/dL (ref 3.9–4.9)
Alkaline Phosphatase: 129 IU/L — ABNORMAL HIGH (ref 44–121)
BUN/Creatinine Ratio: 13 (ref 10–24)
BUN: 14 mg/dL (ref 8–27)
Bilirubin Total: 0.4 mg/dL (ref 0.0–1.2)
CO2: 22 mmol/L (ref 20–29)
Calcium: 9.4 mg/dL (ref 8.6–10.2)
Chloride: 101 mmol/L (ref 96–106)
Creatinine, Ser: 1.12 mg/dL (ref 0.76–1.27)
Globulin, Total: 2.7 g/dL (ref 1.5–4.5)
Glucose: 101 mg/dL — ABNORMAL HIGH (ref 70–99)
Potassium: 4.4 mmol/L (ref 3.5–5.2)
Sodium: 139 mmol/L (ref 134–144)
Total Protein: 7.2 g/dL (ref 6.0–8.5)
eGFR: 73 mL/min/1.73 (ref >59)

## 2024-01-14 LAB — LIPID PANEL
Chol/HDL Ratio: 3.5 ratio (ref 0.0–5.0)
Cholesterol, Total: 138 mg/dL (ref 100–199)
HDL: 40 mg/dL (ref >39)
LDL Chol Calc (NIH): 82 mg/dL (ref 0–99)
Triglycerides: 82 mg/dL (ref 0–149)
VLDL Cholesterol Cal: 16 mg/dL (ref 5–40)

## 2024-01-14 LAB — HEMOGLOBIN A1C

## 2024-01-14 LAB — MICROALBUMIN / CREATININE URINE RATIO
Creatinine, Urine: 143.5 mg/dL
Microalb/Creat Ratio: 106 mg/g{creat} — ABNORMAL HIGH (ref 0–29)
Microalbumin, Urine: 152.4 ug/mL

## 2024-01-14 LAB — CBC WITH DIFFERENTIAL/PLATELET

## 2024-01-22 ENCOUNTER — Encounter: Payer: Self-pay | Admitting: Podiatry

## 2024-01-22 ENCOUNTER — Ambulatory Visit (INDEPENDENT_AMBULATORY_CARE_PROVIDER_SITE_OTHER): Admitting: Podiatry

## 2024-01-22 VITALS — Ht 71.0 in | Wt 283.2 lb

## 2024-01-22 DIAGNOSIS — L84 Corns and callosities: Secondary | ICD-10-CM | POA: Diagnosis not present

## 2024-01-22 DIAGNOSIS — E1142 Type 2 diabetes mellitus with diabetic polyneuropathy: Secondary | ICD-10-CM

## 2024-01-22 DIAGNOSIS — B351 Tinea unguium: Secondary | ICD-10-CM

## 2024-01-22 NOTE — Progress Notes (Signed)
  Subjective:  Patient ID: Spencer Robles, male    DOB: [birth year],  MRN: [id number]  Chief Complaint  Patient presents with   Nail Problem     Patient is here for nail trim and callous trim down no other issues at this time    63 y.o. male presents with the above complaint. History confirmed with patient.  A1c is well-controlled.  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  Has not had issues or ulcer since.  His nails are thickened elongated and causing discomfort, there are multiple calluses giving issues as well  Objective:  Physical Exam: warm, good capillary refill, normal DP and PT pulses, and diffuse peripheral neuropathy. Left Foot: dystrophic yellowed discolored nail plates with subungual debris and callus on medial hallux, distal 2nd, 3rd, 4th and 5th toes Right Foot: dystrophic yellowed discolored nail plates with subungual debris and callus distal fourth toe previous ray amputation of 1st and 2nd toes   Assessment:   1. Onychomycosis   2. Callus of foot   3. Type 2 diabetes mellitus with polyneuropathy (HCC)      Plan:  Patient was evaluated and treated and all questions answered.  Patient educated on diabetes. Discussed proper diabetic foot care and discussed risks and complications of disease. Educated patient in depth on reasons to return to the office immediately should he/she discover anything concerning or new on the feet. All questions answered. Discussed proper shoes as well.   Discussed the etiology and treatment options for the condition in detail with the patient. Recommended debridement of the nails today. Sharp and mechanical debridement performed of all painful and mycotic nails today. Nails debrided in length and thickness using a nail nipper to level of comfort. Follow up as needed for painful nails.   All symptomatic hyperkeratoses were safely debrided with a sterile #15 blade to patient's level of comfort without  incident. We discussed preventative and palliative care of these lesions including supportive and accommodative shoegear, padding, prefabricated and custom molded accommodative orthoses, use of a pumice stone and lotions/creams daily.   Return in about 3 months (around 04/23/2024) for at risk diabetic foot care.

## 2024-01-22 NOTE — Patient Instructions (Signed)
 Look for urea 40% cream or ointment and apply to the thickened dry skin / calluses. This can be bought over the counter, at a pharmacy or online such as [employer].

## 2024-02-08 ENCOUNTER — Emergency Department (HOSPITAL_COMMUNITY)
Admission: EM | Admit: 2024-02-08 | Discharge: 2024-02-08 | Disposition: A | Discharge status: Home / Self Care | Attending: Emergency Medicine | Admitting: Emergency Medicine

## 2024-02-08 ENCOUNTER — Emergency Department (HOSPITAL_COMMUNITY)

## 2024-02-08 ENCOUNTER — Other Ambulatory Visit: Payer: Self-pay

## 2024-02-08 ENCOUNTER — Encounter (HOSPITAL_COMMUNITY): Payer: Self-pay | Admitting: *Deleted

## 2024-02-08 DIAGNOSIS — M79675 Pain in left toe(s): Secondary | ICD-10-CM | POA: Diagnosis not present

## 2024-02-08 DIAGNOSIS — Z794 Long term (current) use of insulin: Secondary | ICD-10-CM | POA: Insufficient documentation

## 2024-02-08 DIAGNOSIS — M85871 Other specified disorders of bone density and structure, right ankle and foot: Secondary | ICD-10-CM | POA: Diagnosis not present

## 2024-02-08 DIAGNOSIS — M7989 Other specified soft tissue disorders: Secondary | ICD-10-CM | POA: Diagnosis not present

## 2024-02-08 DIAGNOSIS — Z7982 Long term (current) use of aspirin: Secondary | ICD-10-CM | POA: Diagnosis not present

## 2024-02-08 LAB — CBC WITH DIFFERENTIAL/PLATELET
Abs Immature Granulocytes: 0.03 K/uL (ref 0.00–0.07)
Basophils Absolute: 0.1 K/uL (ref 0.0–0.1)
Basophils Relative: 1 %
Eosinophils Absolute: 0.3 K/uL (ref 0.0–0.5)
Eosinophils Relative: 4 %
HCT: 41.8 % (ref 39.0–52.0)
Hemoglobin: 14 g/dL (ref 13.0–17.0)
Immature Granulocytes: 0 %
Lymphocytes Relative: 28 %
Lymphs Abs: 2.4 K/uL (ref 0.7–4.0)
MCH: 28.7 pg (ref 26.0–34.0)
MCHC: 33.5 g/dL (ref 30.0–36.0)
MCV: 85.7 fL (ref 80.0–100.0)
Monocytes Absolute: 0.6 K/uL (ref 0.1–1.0)
Monocytes Relative: 6 %
Neutro Abs: 5.2 K/uL (ref 1.7–7.7)
Neutrophils Relative %: 61 %
Platelets: 146 K/uL — ABNORMAL LOW (ref 150–400)
RBC: 4.88 MIL/uL (ref 4.22–5.81)
RDW: 13.2 % (ref 11.5–15.5)
WBC: 8.6 K/uL (ref 4.0–10.5)
nRBC: 0 % (ref 0.0–0.2)

## 2024-02-08 LAB — COMPREHENSIVE METABOLIC PANEL WITH GFR
ALT: 25 U/L (ref 0–44)
AST: 33 U/L (ref 15–41)
Albumin: 4 g/dL (ref 3.5–5.0)
Alkaline Phosphatase: 95 U/L (ref 38–126)
Anion gap: 8 (ref 5–15)
BUN: 15 mg/dL (ref 8–23)
CO2: 25 mmol/L (ref 22–32)
Calcium: 9.2 mg/dL (ref 8.9–10.3)
Chloride: 104 mmol/L (ref 98–111)
Creatinine, Ser: 0.98 mg/dL (ref 0.61–1.24)
GFR, Estimated: >60 mL/min (ref >60)
Glucose, Bld: 127 mg/dL — ABNORMAL HIGH (ref 70–99)
Potassium: 4.6 mmol/L (ref 3.5–5.1)
Sodium: 137 mmol/L (ref 135–145)
Total Bilirubin: 1.1 mg/dL (ref 0.0–1.2)
Total Protein: 7.2 g/dL (ref 6.5–8.1)

## 2024-02-08 LAB — I-STAT CG4 LACTIC ACID, ED: Lactic Acid, Venous: 1.3 mmol/L (ref 0.5–1.9)

## 2024-02-08 MED ORDER — DOXYCYCLINE HYCLATE 100 MG PO TABS
100.0000 mg | ORAL_TABLET | Freq: Once | ORAL | Status: AC
  Administered 2024-02-08: 100 mg via ORAL
  Filled 2024-02-08: qty 1

## 2024-02-08 MED ORDER — DOXYCYCLINE HYCLATE 100 MG PO CAPS: 100.0000 mg | ORAL_CAPSULE | Freq: Two times a day (BID) | ORAL | 0 refills | Status: DC

## 2024-02-08 MED ORDER — DOXYCYCLINE HYCLATE 100 MG PO CAPS
100.0000 mg | ORAL_CAPSULE | Freq: Two times a day (BID) | ORAL | 0 refills | Status: DC
  Filled 2024-02-08: qty 20, 10d supply, fill #0

## 2024-02-08 NOTE — Discharge Instructions (Addendum)
 You can take doxycycline  2 times per day for the next 10 days.  Follow-up with your primary care doctor.  Return to the emergency room if develop increasing pain, redness in your toe, or if you develop fever.

## 2024-02-08 NOTE — ED Triage Notes (Signed)
 Pt via POV c/o toe pain on the right foot. Hx DM2 with recent podiatry appt and he thinks the podiatrist may have cut his toe while doing his nails. Now toe is swollen and painful and pt worries it may be infected.

## 2024-02-08 NOTE — ED Provider Triage Note (Signed)
 Emergency Medicine Provider Triage Evaluation Note  Allenmichael Mcpartlin , a 63 y.o. male  was evaluated in triage.  Pt complains of toe pain.  Review of Systems  Positive:  Negative:  Physical Exam  BP (!) 154/74   Pulse 63   Temp 98.5 F (36.9 C)   Resp 18   Ht 5' 11 (1.803 m)   Wt 128.5 kg   SpO2 95%   BMI 39.51 kg/m  Gen:   Awake, no distress   Resp:  Normal effort  MSK:   Moves extremities without difficulty  Other:    Medical Decision Making  Medically screening exam initiated at 5:15 PM.  Appropriate orders placed.  Abdikadir Fohl was informed that the remainder of the evaluation will be completed by another provider, this initial triage assessment does not replace that evaluation, and the importance of remaining in the ED until their evaluation is complete.  Podiatrist cut toenails around 3 weeks ago. Patient stating that 3 days ago his right middle toe started swelling and looks infected. Denies fever, nausea, vomiting, diarrhea.   Hoy Nidia FALCON, [place] 02/08/24 [phone]

## 2024-02-08 NOTE — ED Provider Notes (Signed)
 [facility] AT [facility] Provider Note   CSN: [id number] Arrival date & time: 02/08/24  1639     Patient presents with: Foot Pain   Spencer Robles is a 63 y.o. male.   63 year old male here today because he is concerned he could be developing an infection in his second toe on his left foot.  He says that he had his nail removed by podiatry a few weeks ago.  No fever, no chills.   Foot Pain       Prior to Admission medications   Medication Sig Start Date End Date Taking? Authorizing Provider  doxycycline  (VIBRAMYCIN ) 100 MG capsule Take 1 capsule (100 mg total) by mouth 2 (two) times daily. 02/08/24  Yes Mannie Pac T, DO  amLODipine  (NORVASC ) 10 MG tablet Take 1 tablet (10 mg total) by mouth daily. 09/28/23   Celestia Rosaline SQUIBB, NP  aspirin  EC 81 MG tablet Take 1 tablet (81 mg total) by mouth daily. 08/28/19   Jerri Keys, MD  bisacodyl  (DULCOLAX) 5 MG EC tablet Take 2 tablets by mouth twice a day 1 days 11/23/23     Blood Glucose Monitoring Suppl (TRUE METRIX METER) w/Device KIT Use to check blood sugar up to 3 times daily. 08/28/19   Jerri Keys, MD  Dulaglutide  (TRULICITY ) 1.5 MG/0.5ML SOAJ Inject 1.5 mg into the skin once a week. 10/01/23   Celestia Rosaline SQUIBB, NP  glucose blood (TRUE METRIX BLOOD GLUCOSE TEST) test strip Use as instructed to check blood sugar up to 3 times daily. 08/28/19   Xu, Fang, MD  ibuprofen (ADVIL) 800 MG tablet Take 800 mg by mouth every 6 (six) hours as needed for mild pain.     [provider]  insulin  glargine (LANTUS ) 100 UNIT/ML Solostar Pen Inject 12 Units into the skin daily. 04/13/23   Celestia Rosaline SQUIBB, NP  Insulin  Pen Needle (TECHLITE PEN NEEDLES) 31G X 8 MM MISC Use as directed. 06/08/23   Celestia Rosaline SQUIBB, NP  lisinopril  (ZESTRIL ) 20 MG tablet Take 1 tablet (20 mg total) by mouth daily. 09/28/23   Celestia Rosaline SQUIBB, NP  metFORMIN  (GLUCOPHAGE -XR) 500 MG 24 hr tablet Take 1 tablet (500 mg total) by mouth daily  with breakfast. 09/28/23   Celestia Rosaline SQUIBB, NP  polyethylene glycol-electrolytes (NULYTELY ) 420 g solution Take as directed. 11/23/23     pravastatin  (PRAVACHOL ) 10 MG tablet Take 1 tablet (10 mg total) by mouth daily. 04/16/23   Celestia Rosaline SQUIBB, NP  TRUEplus Lancets 28G MISC Use as instructed to check blood sugar up to 3 times daily. 08/28/19   Jerri Keys, MD  sitaGLIPtin  (JANUVIA ) 100 MG tablet Take 1 tablet (100 mg total) by mouth daily. 01/13/20 10/20/20  Celestia Rosaline SQUIBB, NP    Allergies: Patient has no known allergies.    Review of Systems  Updated Vital Signs BP (!) 140/76 (BP Location: Right Arm)   Pulse (!) 125   Temp 98.1 F (36.7 C)   Resp 16   Ht 5' 11 (1.803 m)   Wt 128.5 kg   SpO2 96%   BMI 39.51 kg/m   Physical Exam Vitals and nursing note reviewed.  Skin:    General: Skin is warm and dry.     Comments: Callus formation on the plantar surface of the second toe.  No pus, no erythema, no crepitus, normal size.  Neurological:     Mental Status: He is alert.     (all labs ordered are  listed, but only abnormal results are displayed) Labs Reviewed  COMPREHENSIVE METABOLIC PANEL WITH GFR - Abnormal; Notable for the following components:      Result Value   Glucose, Bld 127 (*)    All other components within normal limits  CBC WITH DIFFERENTIAL/PLATELET - Abnormal; Notable for the following components:   Platelets 146 (*)    All other components within normal limits  CULTURE, BLOOD (ROUTINE X 2)  CULTURE, BLOOD (ROUTINE X 2)  I-STAT CG4 LACTIC ACID, ED    EKG: None  Radiology: DG Foot Complete Right Result Date: 02/08/2024 CLINICAL DATA:  [id number] Toe swelling [id number] EXAM: RIGHT FOOT COMPLETE - 3+ VIEW COMPARISON:  November 12, 2021 FINDINGS: Osteopenia. 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 Similar appearance of the first and second metatarsals.No acute fracture or dislocation. There is no evidence of arthropathy or other focal bone abnormality. Soft  tissues are unremarkable. No radiopaque foreign body. IMPRESSION: No acute fracture or dislocation. Electronically Signed   By: Rogelia Myers M.D.   On: 02/08/2024 19:21     Procedures   Medications Ordered in the ED  doxycycline  (VIBRA -TABS) tablet 100 mg (has no administration in time range)                                    Medical Decision Making 63 year old male here today concerned about developing infection in his toe.  Plan-toe overall looks fine.  Does not appear to be infected.  Patient has had multiple amputations to that foot, so understand his concern.  I do not think it is unreasonable to treat the patient with a course of doxycycline .  He is going to follow-up with his primary care doctor.  Reviewed the patient's x-ray, no evidence of osteomyelitis, exam less consistent with osteomyelitis.  Risk Prescription drug management.        Final diagnoses:  Pain of toe of left foot    ED Discharge Orders          Ordered    doxycycline  (VIBRAMYCIN ) 100 MG capsule  2 times daily        02/08/24 2216               Mannie Pac T, DO 02/08/24 2217

## 2024-02-09 ENCOUNTER — Other Ambulatory Visit: Payer: Self-pay

## 2024-02-13 LAB — CULTURE, BLOOD (ROUTINE X 2)
Culture: NO GROWTH
Culture: NO GROWTH
Special Requests: ADEQUATE

## 2024-03-25 ENCOUNTER — Ambulatory Visit (INDEPENDENT_AMBULATORY_CARE_PROVIDER_SITE_OTHER): Admitting: Podiatry

## 2024-03-25 ENCOUNTER — Ambulatory Visit (INDEPENDENT_AMBULATORY_CARE_PROVIDER_SITE_OTHER)

## 2024-03-25 ENCOUNTER — Other Ambulatory Visit: Payer: Self-pay | Admitting: Podiatry

## 2024-03-25 ENCOUNTER — Other Ambulatory Visit: Payer: Self-pay

## 2024-03-25 ENCOUNTER — Other Ambulatory Visit (HOSPITAL_COMMUNITY): Payer: Self-pay

## 2024-03-25 VITALS — Ht 71.0 in | Wt 283.3 lb

## 2024-03-25 DIAGNOSIS — E08621 Diabetes mellitus due to underlying condition with foot ulcer: Secondary | ICD-10-CM

## 2024-03-25 DIAGNOSIS — L97519 Non-pressure chronic ulcer of other part of right foot with unspecified severity: Secondary | ICD-10-CM | POA: Diagnosis not present

## 2024-03-25 DIAGNOSIS — L02611 Cutaneous abscess of right foot: Secondary | ICD-10-CM | POA: Diagnosis not present

## 2024-03-25 MED ORDER — DOXYCYCLINE HYCLATE 100 MG PO CAPS
100.0000 mg | ORAL_CAPSULE | Freq: Two times a day (BID) | ORAL | 0 refills | Status: DC
  Filled 2024-03-25 (×2): qty 20, 10d supply, fill #0

## 2024-03-26 ENCOUNTER — Encounter: Payer: Self-pay | Admitting: Podiatry

## 2024-03-26 NOTE — Progress Notes (Signed)
  Subjective:  Patient ID: Spencer Robles, male    DOB: 25-Dec-1960,  MRN: [id number]  Chief Complaint  Patient presents with   Toe Pain   Wound Check    RM 4 Patient is here for callus and swelling of the right 4th toe. patient states right 4th toe has been swollen for the past five days.    63 y.o. male presents with the above complaint. History confirmed with patient.  Presents with a new issue with swelling around the fourth toe on the right foot    Objective:  Physical Exam: Foot is warm well-perfused on the right foot there is fluctuance at the distal interphalangeal joint of the fourth toe with hammertoe contracture  Radiographs: Multiple views x-ray of the right foot: Right foot radiograph shows no bone destruction or osteomyelitis Assessment:   1. Diabetic ulcer of toe of right foot associated with diabetes mellitus due to underlying condition, unspecified ulcer stage (HCC)   2. Abscess of toe of right foot      Plan:  Patient was evaluated and treated and all questions answered.  Discussed he likely has infection of the fourth toe with abscess formation, radiographs are unrevealing for any deeper infection.  I recommended I&D today.  Following consent and prep of the toe with Betadine the bulla was lanced and purulent drainage emanated from the toe.  Culture of this drainage was taken and sent to [facility].  I recommended oral antibiotic therapy and Rx for doxycycline  was sent to his pharmacy as well we will change as needed pending his culture results.  Follow-up with me in 3 weeks to reevaluate.  No follow-ups on file.

## 2024-03-27 ENCOUNTER — Other Ambulatory Visit (HOSPITAL_BASED_OUTPATIENT_CLINIC_OR_DEPARTMENT_OTHER): Payer: Self-pay

## 2024-03-28 ENCOUNTER — Other Ambulatory Visit: Payer: Self-pay

## 2024-03-28 ENCOUNTER — Ambulatory Visit: Payer: Self-pay | Admitting: Podiatry

## 2024-03-28 LAB — WOUND CULTURE
MICRO NUMBER:: 16911458
SPECIMEN QUALITY:: ADEQUATE

## 2024-03-28 LAB — HOUSE ACCOUNT TRACKING

## 2024-03-28 MED ORDER — AMOXICILLIN-POT CLAVULANATE 875-125 MG PO TABS
1.0000 | ORAL_TABLET | Freq: Two times a day (BID) | ORAL | 0 refills | Status: DC
  Filled 2024-03-28: qty 20, 10d supply, fill #0

## 2024-04-08 ENCOUNTER — Other Ambulatory Visit: Payer: Self-pay

## 2024-04-14 ENCOUNTER — Ambulatory Visit (INDEPENDENT_AMBULATORY_CARE_PROVIDER_SITE_OTHER): Admitting: Primary Care

## 2024-04-22 ENCOUNTER — Other Ambulatory Visit (INDEPENDENT_AMBULATORY_CARE_PROVIDER_SITE_OTHER): Payer: Self-pay | Admitting: Primary Care

## 2024-04-22 ENCOUNTER — Encounter: Payer: Self-pay | Admitting: Podiatry

## 2024-04-22 ENCOUNTER — Other Ambulatory Visit: Payer: Self-pay

## 2024-04-22 ENCOUNTER — Ambulatory Visit (INDEPENDENT_AMBULATORY_CARE_PROVIDER_SITE_OTHER): Admitting: Podiatry

## 2024-04-22 ENCOUNTER — Telehealth (INDEPENDENT_AMBULATORY_CARE_PROVIDER_SITE_OTHER): Payer: Self-pay | Admitting: Primary Care

## 2024-04-22 DIAGNOSIS — E119 Type 2 diabetes mellitus without complications: Secondary | ICD-10-CM

## 2024-04-22 DIAGNOSIS — E08621 Diabetes mellitus due to underlying condition with foot ulcer: Secondary | ICD-10-CM | POA: Diagnosis not present

## 2024-04-22 DIAGNOSIS — Z760 Encounter for issue of repeat prescription: Secondary | ICD-10-CM

## 2024-04-22 DIAGNOSIS — L97519 Non-pressure chronic ulcer of other part of right foot with unspecified severity: Secondary | ICD-10-CM

## 2024-04-22 MED ORDER — MUPIROCIN 2 % EX OINT
1.0000 | TOPICAL_OINTMENT | Freq: Every day | CUTANEOUS | 2 refills | Status: AC
  Filled 2024-04-22: qty 22, 22d supply, fill #0

## 2024-04-22 MED ORDER — AMOXICILLIN-POT CLAVULANATE 875-125 MG PO TABS
1.0000 | ORAL_TABLET | Freq: Two times a day (BID) | ORAL | 0 refills | Status: AC
  Filled 2024-04-22: qty 28, 14d supply, fill #0

## 2024-04-22 NOTE — Telephone Encounter (Signed)
 Called pt to reschedule appt. Pts phone was unavailable. If pt call back please reschedule for a day, provider will be in office.

## 2024-04-22 NOTE — Progress Notes (Signed)
  Subjective:  Patient ID: Spencer Robles, male    DOB: January 14, 1961,  MRN: [id number]  Chief Complaint  Patient presents with   Foot Ulcer    R 2nd toe ulcer check. Closed swelling.  Diabetic A1c 6.3 per pt. No anti coag    63 y.o. male presents with the above complaint. History confirmed with patient.    Objective:  Physical Exam: Foot is warm well-perfused on the right foot distal fourth toe has a full-thickness ulceration measuring 0.8 and 0.4 x 0.4 cm.     Radiographs: Multiple views x-ray of the right foot: Right foot radiograph shows no bone destruction or osteomyelitis  Previous culture growth of Proteus mirabilis Assessment:   1. Diabetic ulcer of toe of right foot associated with diabetes mellitus due to underlying condition, unspecified ulcer stage (HCC)      Plan:  Patient was evaluated and treated and all questions answered.  Presents today for follow-up on his toe infection shows quite a bit of improvement clinically but does have a full-thickness ulceration still he did not start his Augmentin  after the last visit, I recommended he start this and new prescription was sent today as well as gentamicin ointment for local wound care.  Dress daily with gauze gentamicin and Coban.  Follow-up in 3 weeks for ongoing wound care.  Return in about 3 weeks (around 05/13/2024) for wound care.

## 2024-04-23 ENCOUNTER — Encounter: Payer: Self-pay | Admitting: Podiatry

## 2024-04-23 ENCOUNTER — Ambulatory Visit: Admitting: Podiatry

## 2024-04-23 DIAGNOSIS — E1142 Type 2 diabetes mellitus with diabetic polyneuropathy: Secondary | ICD-10-CM | POA: Diagnosis not present

## 2024-04-23 DIAGNOSIS — B351 Tinea unguium: Secondary | ICD-10-CM

## 2024-04-23 NOTE — Progress Notes (Signed)
 This patient returns to my office for at risk foot care.  This patient requires this care by a professional since this patient will be at risk due to having diabetes.  This patient is unable to cut nails himself since the patient cannot reach his nails.These nails are painful walking and wearing shoes. 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 This patient presents for at risk foot care today.  General Appearance  Alert, conversant and in no acute stress.  Vascular  Dorsalis pedis and posterior tibial  pulses are palpable  bilaterally.  Capillary return is within normal limits  bilaterally. Temperature is within normal limits  bilaterally.  Neurologic  Senn-Weinstein monofilament wire test within normal limits  bilaterally. Muscle power within normal limits bilaterally.  Nails Thick disfigured discolored nails with subungual debris  from hallux to fifth toes bilaterally. No evidence of bacterial infection or drainage bilaterally.  Orthopedic  No limitations of motion  feet .  No crepitus or effusions noted.  Clavi 3rd toes  B/L.  Amputation 1,2 right foot.  Skin  normotropic skin with no porokeratosis noted bilaterally.  Healing ulcer third toe right foot.   Onychomycosis  Pain in right toes  Pain in left toes  Consent was obtained for treatment procedures.   Mechanical debridement of nails 1-5  bilaterally performed with a nail nipper.  Filed with dremel without incident.  Padding dispensed third toe right foot.  Told to follow up with Dr.  Silva in three weeks.   Return office visit   prn                  Told patient to return for periodic foot care and evaluation due to potential at risk complications.   Cordella Bold DPM

## 2024-04-24 ENCOUNTER — Other Ambulatory Visit: Payer: Self-pay

## 2024-04-24 MED ORDER — TRULICITY 1.5 MG/0.5ML ~~LOC~~ SOAJ
1.5000 mg | SUBCUTANEOUS | 0 refills | Status: DC
  Filled 2024-04-24: qty 6, 84d supply, fill #0

## 2024-04-24 NOTE — Telephone Encounter (Signed)
 Requested Prescriptions  Pending Prescriptions Disp Refills   Dulaglutide  (TRULICITY ) 1.5 MG/0.5ML SOAJ 6 mL 0    Sig: Inject 1.5 mg into the skin once a week.     Endocrinology:  Diabetes - GLP-1 Receptor Agonists Failed - 04/24/2024  8:06 AM      Failed - HBA1C is between 0 and 7.9 and within 180 days    HbA1c, POC (controlled diabetic range)  Date Value Ref Range Status  04/13/2023 5.7 0.0 - 7.0 % Final   Hgb A1c MFr Bld  Date Value Ref Range Status  01/11/2024 CANCELED %     Comment:    Test not performed. Insufficient specimen to perform or complete analysis.          Prediabetes: 5.7 - 6.4          Diabetes: >6.4          Glycemic control for adults with diabetes: <7.0  Result canceled by the ancillary.          Passed - Valid encounter within last 6 months    Recent Outpatient Visits           3 months ago Type 2 diabetes mellitus without complication, with long-term current use of insulin  (HCC)   [place] [facility] Celestia Rosaline SQUIBB, NP   7 months ago Hospital discharge follow-up   [place] [facility] Celestia Rosaline SQUIBB, NP   9 months ago Type 2 diabetes mellitus without complication, with long-term current use of insulin  [facility])   [place] [facility] Celestia Rosaline SQUIBB, NP   1 year ago Type 2 diabetes mellitus without complication, with long-term current use of insulin  [facility])    [facility] Celestia Rosaline SQUIBB, NP   1 year ago Hospital discharge follow-up    [facility] Celestia Rosaline SQUIBB, NP

## 2024-04-25 ENCOUNTER — Other Ambulatory Visit: Payer: Self-pay

## 2024-04-30 ENCOUNTER — Ambulatory Visit (INDEPENDENT_AMBULATORY_CARE_PROVIDER_SITE_OTHER): Admitting: Primary Care

## 2024-05-02 ENCOUNTER — Other Ambulatory Visit: Payer: Self-pay | Admitting: Pharmacist

## 2024-05-02 ENCOUNTER — Other Ambulatory Visit: Payer: Self-pay

## 2024-05-02 DIAGNOSIS — Z760 Encounter for issue of repeat prescription: Secondary | ICD-10-CM

## 2024-05-02 DIAGNOSIS — E119 Type 2 diabetes mellitus without complications: Secondary | ICD-10-CM

## 2024-05-02 DIAGNOSIS — I10 Essential (primary) hypertension: Secondary | ICD-10-CM

## 2024-05-02 MED ORDER — LISINOPRIL 20 MG PO TABS
20.0000 mg | ORAL_TABLET | Freq: Every day | ORAL | 1 refills | Status: AC
Start: 2024-05-02 — End: ?
  Filled 2024-05-02: qty 90, 90d supply, fill #0

## 2024-05-02 NOTE — Progress Notes (Signed)
 Pharmacy Quality Measure Review  This patient is appearing on a report for being at risk of failing the adherence measure for hypertension (ACEi/ARB) medications this calendar year.   Medication: lisinopril  Last fill date: 12/28/23 for 90 day supply  Pharmacy needed refills. Rxn sent 05/02/2024. Reminder set for next fill.   Herlene Fleeta Morris, PharmD, JAQUELINE, CPP Clinical Pharmacist [facility] [phone]

## 2024-05-06 ENCOUNTER — Other Ambulatory Visit: Payer: Self-pay | Admitting: Pharmacist

## 2024-05-06 ENCOUNTER — Other Ambulatory Visit: Payer: Self-pay

## 2024-05-06 ENCOUNTER — Other Ambulatory Visit (INDEPENDENT_AMBULATORY_CARE_PROVIDER_SITE_OTHER): Payer: Self-pay | Admitting: Primary Care

## 2024-05-06 MED ORDER — AMLODIPINE BESYLATE 10 MG PO TABS
10.0000 mg | ORAL_TABLET | Freq: Every day | ORAL | 1 refills | Status: AC
  Filled 2024-05-06: qty 90, 90d supply, fill #0

## 2024-05-06 NOTE — Progress Notes (Signed)
 Pharmacy Quality Measure Review  This patient is appearing on a report for being at risk of failing the adherence measure for hypertension (ACEi/ARB) medications this calendar year.   Medication: lisinopril  Last fill date: 12/28/23 for 90 day supply  Pharmacy needed refills. Rxn sent 05/02/2024. Reminder set for next fill.   I called him today, 05/06/24, to remind him that the medication is  ready for $0 copay. He tells me he will come pick this up some time this week.   Herlene Fleeta Morris, PharmD, JAQUELINE, CPP Clinical Pharmacist [facility] [phone]

## 2024-05-07 ENCOUNTER — Other Ambulatory Visit: Payer: Self-pay

## 2024-05-15 ENCOUNTER — Ambulatory Visit: Admitting: Podiatry

## 2024-05-15 ENCOUNTER — Other Ambulatory Visit: Payer: Self-pay

## 2024-05-15 ENCOUNTER — Other Ambulatory Visit (HOSPITAL_COMMUNITY): Payer: Self-pay

## 2024-05-15 VITALS — Ht 71.0 in | Wt 283.0 lb

## 2024-05-15 DIAGNOSIS — L97512 Non-pressure chronic ulcer of other part of right foot with fat layer exposed: Secondary | ICD-10-CM | POA: Diagnosis not present

## 2024-05-15 DIAGNOSIS — E08621 Diabetes mellitus due to underlying condition with foot ulcer: Secondary | ICD-10-CM

## 2024-05-15 MED ORDER — DOXYCYCLINE HYCLATE 100 MG PO TABS
100.0000 mg | ORAL_TABLET | Freq: Two times a day (BID) | ORAL | 0 refills | Status: DC
  Filled 2024-05-15 (×2): qty 28, 14d supply, fill #0

## 2024-05-15 NOTE — Patient Instructions (Signed)

## 2024-05-17 ENCOUNTER — Ambulatory Visit
Admission: RE | Admit: 2024-05-17 | Discharge: 2024-05-17 | Disposition: A | Discharge status: Home / Self Care | Source: Ambulatory Visit | Attending: Podiatry | Admitting: Podiatry

## 2024-05-17 DIAGNOSIS — M86671 Other chronic osteomyelitis, right ankle and foot: Secondary | ICD-10-CM | POA: Diagnosis not present

## 2024-05-17 DIAGNOSIS — L97512 Non-pressure chronic ulcer of other part of right foot with fat layer exposed: Secondary | ICD-10-CM

## 2024-05-18 NOTE — Progress Notes (Signed)
  Subjective:  Patient ID: Spencer Robles, male    DOB: 02-Jan-1961,  MRN: [id number]  Chief Complaint  Patient presents with   Wound Check    RM 8 Patient is here fpr ulcer of the right foot. Wound on right 3rd toe is scabbed over with no drainage.     63 y.o. male presents with the above complaint. History confirmed with patient.    Objective:  Physical Exam: Underlying the scab there is no active drainage or purulence today     Radiographs: Multiple views x-ray of the right foot: Right foot radiograph shows no bone destruction or osteomyelitis  Previous culture growth of Proteus mirabilis Assessment:   1. 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  Still has persistent sinus draining from the fourth toe.  I recommended MRI and placed him on doxycycline .  Follow-up in 3 weeks for follow-up of this, advised on signs and symptoms of worsening infection and notify me if this develops.  Return in 3 weeks (on 06/05/2024) for wound care.

## 2024-05-20 ENCOUNTER — Telehealth: Payer: Self-pay

## 2024-05-20 NOTE — Progress Notes (Signed)
 Patient is currently not on statin therapy. Based on history of T2DM, HTN, and impaired wound healing. Patient has calculated PREVENT ASCVD 10-yr risk score of 13.9%. Patient is indicated for high intensity statin.  High intensity statin therapy could be considered for this patient based on risk factors. Rosuvastatin 20-40 mg or atorvastatin  40-80 mg could be initiated for this patient.   PCP appointment on 05/23/24 with Rosaline Bohr, NP.  Jenkins Graces, PharmD PGY1 Pharmacy Resident [phone]

## 2024-05-22 ENCOUNTER — Telehealth (INDEPENDENT_AMBULATORY_CARE_PROVIDER_SITE_OTHER): Payer: Self-pay | Admitting: Primary Care

## 2024-05-22 NOTE — Telephone Encounter (Signed)
Pt will be present.

## 2024-05-23 ENCOUNTER — Ambulatory Visit (INDEPENDENT_AMBULATORY_CARE_PROVIDER_SITE_OTHER): Admitting: Primary Care

## 2024-05-23 ENCOUNTER — Encounter (INDEPENDENT_AMBULATORY_CARE_PROVIDER_SITE_OTHER): Payer: Self-pay | Admitting: Primary Care

## 2024-05-23 VITALS — BP 152/92 | HR 63 | Resp 16 | Wt 292.8 lb

## 2024-05-23 DIAGNOSIS — E119 Type 2 diabetes mellitus without complications: Secondary | ICD-10-CM | POA: Diagnosis not present

## 2024-05-23 DIAGNOSIS — E782 Mixed hyperlipidemia: Secondary | ICD-10-CM

## 2024-05-23 DIAGNOSIS — Z794 Long term (current) use of insulin: Secondary | ICD-10-CM

## 2024-05-23 DIAGNOSIS — I10 Essential (primary) hypertension: Secondary | ICD-10-CM

## 2024-05-23 LAB — POCT GLYCOSYLATED HEMOGLOBIN (HGB A1C): HbA1c, POC (controlled diabetic range): 6.8 % (ref 0.0–7.0)

## 2024-05-23 NOTE — Patient Instructions (Signed)
 May 26, 2024 Dr Harden diabetic wound ulcer 2:30

## 2024-05-23 NOTE — Telephone Encounter (Signed)
 Pt was seen today. Was this address

## 2024-05-23 NOTE — Progress Notes (Signed)
 Subjective:  Patient ID: Spencer Robles, male    DOB: Jul 29, 1960  Age: 63 y.o. MRN: [id number]  CC: Diabetes, Hyperlipidemia, and Hypertension   Fielding Mault presents forFollow-up of diabetes. Patient does not check blood sugar at home Diabetes  Hyperlipidemia  Hypertension    Compliant with meds - Yes Checking CBGs? No  Fasting avg -   Postprandial average -  Exercising regularly? - Yes Watching carbohydrate intake? - Yes Neuropathy ? - Yes Hypoglycemic events - No  - Recovers with :   Pertinent ROS:  Polyuria - No Polydipsia - No Vision problems - No  Medications as noted below. Taking them regularly without complication/adverse reaction being reported today.   HTN - Patient has No headache, No chest pain, No abdominal pain - No Nausea, No new weakness tingling or numbness, No Cough - shortness of breath/s . Elevated admits to falling off his diet and foot problems caused him t decrease his routine walking  History Mannix has a past medical history of Diabetes mellitus without complication (HCC), Epididymitis (08/15/2017), necrotising fasciitis (06/2017), Scrotal swelling, and Sleep apnea.   He has a past surgical history that includes Circumcision; Amputation toe (Right, 07/06/2017); Mass biopsy (~ 2008); Scrotal exploration (N/A, 08/21/2017); and Scrotal exploration (N/A, 08/30/2017).   His family history is not on file.He reports that he has never smoked. He has never used smokeless tobacco. He reports that he does not drink alcohol and does not use drugs.  Current Outpatient Medications on File Prior to Visit  Medication Sig Dispense Refill   amLODipine  (NORVASC ) 10 MG tablet Take 1 tablet (10 mg total) by mouth daily. 90 tablet 1   aspirin  EC 81 MG tablet Take 1 tablet (81 mg total) by mouth daily. 90 tablet 0   bisacodyl  (DULCOLAX) 5 MG EC tablet Take 2 tablets by mouth twice a day 1 days 4 tablet 0   Blood Glucose Monitoring Suppl (TRUE METRIX METER) w/Device  KIT Use to check blood sugar up to 3 times daily. 1 kit 0   Dulaglutide  (TRULICITY ) 1.5 MG/0.5ML SOAJ Inject 1.5 mg into the skin once a week. 6 mL 0   glucose blood (TRUE METRIX BLOOD GLUCOSE TEST) test strip Use as instructed to check blood sugar up to 3 times daily. 100 each 11   ibuprofen (ADVIL) 800 MG tablet Take 800 mg by mouth every 6 (six) hours as needed for mild pain.      insulin  glargine (LANTUS ) 100 UNIT/ML Solostar Pen Inject 12 Units into the skin daily. 15 mL 3   Insulin  Pen Needle (TECHLITE PEN NEEDLES) 31G X 8 MM MISC Use as directed. 100 each 33   lisinopril  (ZESTRIL ) 20 MG tablet Take 1 tablet (20 mg total) by mouth daily. 90 tablet 1   mupirocin ointment (BACTROBAN) 2 % Apply 1 Application topically daily. 22 g 2   polyethylene glycol-electrolytes (NULYTELY ) 420 g solution Take as directed. 4000 mL 0   TRUEplus Lancets 28G MISC Use as instructed to check blood sugar up to 3 times daily. 100 each 11   [DISCONTINUED] sitaGLIPtin  (JANUVIA ) 100 MG tablet Take 1 tablet (100 mg total) by mouth daily. 90 tablet 1   No current facility-administered medications on file prior to visit.    Review of Systems Comprehensive ROS Pertinent positive and negative noted in HPI   Objective:  BP (!) 152/92 (BP Location: Right Arm, Patient Position: Sitting, Cuff Size: Normal)   Pulse 63   Resp 16   Wt 292  lb 12.8 oz (132.8 kg)   SpO2 96%   BMI 40.84 kg/m   BP Readings from Last 3 Encounters:  05/23/24 (!) 152/92  02/08/24 135/78  01/11/24 (!) 145/80    Wt Readings from Last 3 Encounters:  05/23/24 292 lb 12.8 oz (132.8 kg)  05/15/24 283 lb (128.4 kg)  03/25/24 283 lb 4.6 oz (128.5 kg)    Physical Exam Vitals reviewed.  Constitutional:      Appearance: He is obese.  HENT:     Head: Normocephalic.     Right Ear: Tympanic membrane and external ear normal.     Left Ear: Tympanic membrane and external ear normal.     Nose: Nose normal.  Eyes:     Extraocular Movements:  Extraocular movements intact.     Pupils: Pupils are equal, round, and reactive to light.  Cardiovascular:     Rate and Rhythm: Normal rate and regular rhythm.  Pulmonary:     Effort: Pulmonary effort is normal.     Breath sounds: Normal breath sounds.  Abdominal:     General: Bowel sounds are normal. There is distension.     Palpations: Abdomen is soft.  Musculoskeletal:        General: Normal range of motion.  Skin:    General: Skin is warm and dry.  Neurological:     Mental Status: He is oriented to person, place, and time.  Psychiatric:        Mood and Affect: Mood normal.        Behavior: Behavior normal.        Thought Content: Thought content normal.        Judgment: Judgment normal.     Lab Results  Component Value Date   HGBA1C 6.8 05/23/2024   HGBA1C CANCELED 01/11/2024   HGBA1C 5.8 (H) 07/13/2023    Lab Results  Component Value Date   WBC 8.6 02/08/2024   HGB 14.0 02/08/2024   HCT 41.8 02/08/2024   PLT 146 (L) 02/08/2024   GLUCOSE 127 (H) 02/08/2024   CHOL 138 01/11/2024   TRIG 82 01/11/2024   HDL 40 01/11/2024   LDLCALC 82 01/11/2024   ALT 25 02/08/2024   AST 33 02/08/2024   NA 137 02/08/2024   K 4.6 02/08/2024   CL 104 02/08/2024   CREATININE 0.98 02/08/2024   BUN 15 02/08/2024   CO2 25 02/08/2024   HGBA1C 6.8 05/23/2024    Title   Diabetic Foot Exam - detailed    Semmes-Weinstein Monofilament Test + means has sensation and - means no sensation      Image components are not supported.   Image components are not supported. Image components are not supported.  [place] Comments      Assessment & Plan:   Dewell was seen today for diabetes, hyperlipidemia and hypertension.  Diagnoses and all orders for this visit:  Type 2 diabetes mellitus without complication, with long-term current use of insulin  (HCC) -     POCT glycosylated hemoglobin (Hb A1C) -     CMP14+EGFR -     Lipid panel  Essential hypertension -     CBC  with Differential/Platelet -     CMP14+EGFR  Mixed hyperlipidemia -     Lipid panel    Type 2 diabetes mellitus without complication, with long-term current use of insulin  (HCC) -     POCT glycosylated hemoglobin (Hb A1C) -     CMP14+EGFR -     Lipid panel  Essential hypertension -     CBC with Differential/Platelet -     CMP14+EGFR  Mixed hyperlipidemia -     Lipid panel     Follow-up:  No follow-ups on file.  The above assessment and management plan was discussed with the patient. The patient verbalized understanding of and has agreed to the management plan. Patient is aware to call the clinic if symptoms fail to improve or worsen. Patient is aware when to return to the clinic for a follow-up visit. Patient educated on when it is appropriate to go to the emergency department.   Rosaline Bohr, NP-C

## 2024-05-26 ENCOUNTER — Encounter: Payer: Self-pay | Admitting: Radiology

## 2024-05-26 ENCOUNTER — Ambulatory Visit: Admitting: Orthopedic Surgery

## 2024-05-26 DIAGNOSIS — M86671 Other chronic osteomyelitis, right ankle and foot: Secondary | ICD-10-CM

## 2024-05-27 ENCOUNTER — Encounter: Payer: Self-pay | Admitting: Orthopedic Surgery

## 2024-05-27 NOTE — Progress Notes (Signed)
 Office Visit Note   Patient: Spencer Robles           Date of Birth: 10/19/1960           MRN: [id number] Visit Date: 05/26/2024              Requested by: Celestia Rosaline SQUIBB, NP [address] [place],  [place] [address] PCP: Celestia Rosaline SQUIBB, NP  Chief Complaint  Patient presents with   Right Foot - Wound Check      HPI: Discussed the use of AI scribe software for clinical note transcription with the patient, who gave verbal consent to proceed.  History of Present Illness Spencer Robles is a 63 year old male with type 2 diabetes who presents with concerns about osteomyelitis in his right foot.  He has a history of type 2 diabetes and has previously undergone amputations of the first and second toes. He is currently experiencing issues with the fourth and fifth toes, including swelling and signs of infection. A nurse initially noticed an ulcer on the fourth toe, which was later communicated to the doctor.  He typically wears sandals but has switched to closed shoes due to colder weather. He avoids cold air, believing it affects his toes. He also notes tightness in his Achilles tendons on both sides.  The fourth toe is swollen and painful, with a sensation of tissue pulling and bleeding due to callus formation. These symptoms have persisted for several months, often without realizing the severity of the infection.  He is currently on antibiotics. He is concerned about the potential need for further amputations.  No pain when touching the affected area, but sometimes experiences sensations. The last toe did not appear infected until recently.     Assessment & Plan: Visit Diagnoses:  1. Chronic osteomyelitis of toe of right foot (HCC)     Plan: Assessment and Plan Assessment & Plan Right fourth and fifth toe osteomyelitis with cellulitis, status post previous right first and second toe amputations Chronic osteomyelitis in the right fourth toe and early osteomyelitis  in the fifth toe. MRI confirms osteomyelitis and edema. Infection worsened despite antibiotics, necessitating surgical intervention. Amputation of fourth, fifth, and possibly third toes recommended to prevent further infection spread. He understands the necessity despite hesitance. - Recommended amputation of the fourth, fifth, and possibly third toes. - Continue antibiotics to prevent spread of infection. - Will schedule surgery at his convenience, possibly in several weeks.  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 Diabetes contributes to stiffness, reducing flexibility. - Advised stretching exercises for the Achilles tendon to reduce pressure on the ball of the foot.  Antalgic gait with varus knee alignment Slow antalgic gait with varus alignment to both knees and a short shuffling gait. Likely related to pain and altered biomechanics due to foot pathology and Achilles contracture. - Address underlying foot pathology and Achilles contracture to improve gait.      Follow-Up Instructions: Return in about 2 weeks (around 06/09/2024).   Ortho Exam  Patient is alert, oriented, no adenopathy, well-dressed, normal affect, normal respiratory effort. Physical Exam CARDIOVASCULAR: Strong dorsalis pedis pulse bilaterally. MUSCULOSKELETAL: Slow antalgic gait with varus alignment to both knees and short shuffling gait. Previous first and second toe amputations on right foot. Sausage digit swelling of the fourth toe. Cellulitis and swelling of the fifth toe. Achilles contracture with dorsiflexion neutral bilaterally.      Imaging: No  results found. No images are attached to the encounter.  Labs: Lab Results  Component Value Date   HGBA1C 6.8 05/23/2024   HGBA1C CANCELED 01/11/2024   HGBA1C 5.8 (H) 07/13/2023   ESRSEDRATE 7 08/25/2019   ESRSEDRATE 17 05/08/2018   ESRSEDRATE 70 (H)  07/03/2017   CRP 2 05/08/2018   CRP 12.9 (H) 07/03/2017   CRP 14.2 (H) 07/03/2017   REPTSTATUS 02/13/2024 FINAL 02/08/2024   GRAMSTAIN  08/26/2019    RARE WBC PRESENT,BOTH PMN AND MONONUCLEAR FEW GRAM POSITIVE COCCI IN PAIRS FEW GRAM POSITIVE RODS Performed at [facility], [address] [place], [place] [address]    CULT  02/08/2024    NO GROWTH 5 DAYS Performed at [facility], [address], [place], [place] [address]    LABORGA METHICILLIN RESISTANT STAPHYLOCOCCUS AUREUS 08/26/2019   LABORGA KLEBSIELLA ORNITHINOLYTICA 08/26/2019     Lab Results  Component Value Date   ALBUMIN 4.0 02/08/2024   ALBUMIN 4.5 01/11/2024   ALBUMIN 4.5 07/13/2023   PREALBUMIN 8.5 (L) 07/03/2017    Lab Results  Component Value Date   MG 1.7 05/08/2018   No results found for: [facility]  Lab Results  Component Value Date   PREALBUMIN 8.5 (L) 07/03/2017      Latest Ref Rng & Units 02/08/2024    5:36 PM 01/11/2024    9:58 AM 08/21/2023   12:35 PM  CBC EXTENDED  WBC 4.0 - 10.5 K/uL 8.6  CANCELED  10.5   RBC 4.22 - 5.81 MIL/uL 4.88   4.70   Hemoglobin 13.0 - 17.0 g/dL 85.9   86.5   HCT 60.9 - 52.0 % 41.8   40.4   Platelets 150 - 400 K/uL 146   121   NEUT# 1.7 - 7.7 K/uL 5.2   7.8   Lymph# 0.7 - 4.0 K/uL 2.4   1.8      There is no height or weight on file to calculate BMI.  Orders:  No orders of the defined types were placed in this encounter.  No orders of the defined types were placed in this encounter.    Procedures: No procedures performed  Clinical Data: No additional findings.  ROS:  All other systems negative, except as noted in the HPI. Review of Systems  Objective: Vital Signs: There were no vitals taken for this visit.  Specialty Comments:  No specialty comments available.  PMFS History: Patient Active Problem List   Diagnosis Date Noted   Cutaneous abscess of right foot    Chronic pain of left knee 03/05/2018   Scrotal abscess 08/30/2017    Abscess 08/30/2017   Epididymoorchitis 08/15/2017   Great toe amputation status, right 07/24/2017   Diabetic foot infection (HCC)    Subacute osteomyelitis, right ankle and foot (HCC)    Diabetic foot (HCC) 07/03/2017   Diabetes mellitus type 2, uncomplicated (HCC) 04/15/2015   Prehypertension 04/15/2015   Past Medical History:  Diagnosis Date   Diabetes mellitus without complication (HCC)    Epididymitis 08/15/2017   Hx of necrotising fasciitis 06/2017   RLE/notes 08/15/2017   Scrotal swelling    left; due to Epididymitis/notes 08/15/2017   Sleep apnea    does not use a cpap    History reviewed. No pertinent family history.  Past Surgical History:  Procedure Laterality Date   AMPUTATION TOE Right 07/06/2017   Procedure: RIGHT GREAT TOE AND SECOND TOE AMPUTATION;  Surgeon: Harden Jerona GAILS, MD;  Location: [facility] OR;  Service:  Orthopedics;  Laterality: Right;   CIRCUMCISION     MASS BIOPSY  ~ 2008   scrotum/notes 08/15/2017   SCROTAL EXPLORATION N/A 08/21/2017   Procedure: INCISION AND DRAINAGE SCROTAL ABSCESS, CYSTOSCOPY, COMPLEX INSERTION OF URETHERAL FOLEY CATHETER;  Surgeon: Renda Glance, MD;  Location: [facility] OR;  Service: Urology;  Laterality: N/A;   SCROTAL EXPLORATION N/A 08/30/2017   Procedure: SCROTUM DEBRIDEMENT;  Surgeon: Renda Glance, MD;  Location: [facility] ORS;  Service: Urology;  Laterality: N/A;   Social History   Occupational History   Not on file  Tobacco Use   Smoking status: Never   Smokeless tobacco: Never  Vaping Use   Vaping status: Never Used  Substance and Sexual Activity   Alcohol use: No    Alcohol/week: 0.0 standard drinks of alcohol   Drug use: No   Sexual activity: Not Currently

## 2024-05-28 ENCOUNTER — Other Ambulatory Visit (INDEPENDENT_AMBULATORY_CARE_PROVIDER_SITE_OTHER): Payer: Self-pay | Admitting: Primary Care

## 2024-05-28 ENCOUNTER — Ambulatory Visit (INDEPENDENT_AMBULATORY_CARE_PROVIDER_SITE_OTHER): Admitting: Podiatry

## 2024-05-28 ENCOUNTER — Other Ambulatory Visit: Payer: Self-pay

## 2024-05-28 DIAGNOSIS — Z760 Encounter for issue of repeat prescription: Secondary | ICD-10-CM

## 2024-05-28 DIAGNOSIS — Z794 Long term (current) use of insulin: Secondary | ICD-10-CM

## 2024-05-28 DIAGNOSIS — M86171 Other acute osteomyelitis, right ankle and foot: Secondary | ICD-10-CM | POA: Diagnosis not present

## 2024-05-28 MED ORDER — DOXYCYCLINE HYCLATE 100 MG PO TABS
100.0000 mg | ORAL_TABLET | Freq: Two times a day (BID) | ORAL | 0 refills | Status: DC
  Filled 2024-05-28: qty 28, 14d supply, fill #0

## 2024-05-28 NOTE — Progress Notes (Signed)
 Patient presents for follow-up on ulceration fourth toe right and follow-up MRI for osteomyelitis fourth toe right.  He has been seeing Dr. Silva.  No fever chills nausea or vomiting.  Has not noticed any drainage from the toe the past several days.   Physical exam:  General appearance: Pleasant, and in no acute distress. AOx3.  Vascular: Pedal pulses: DP 2/4 bilaterally, PT 2/4 bilaterally.  Moderate generalized edema lower legs bilaterally. Capillary fill time immediate right.  Neurological: Diabetic neuropathy right  Dermatologic:   Ulceration fourth toe right has healed with no signs of drainage or purulence.  Distal aspect of the fourth toe with still edematous.  No erythema noted.  Musculoskeletal: Status post amputation 1st and 2nd toes right.  Hammertoe and severe hammertoe deformities 3 through 5 right    Diagnosis: 1.  Osteomyelitis distal phalanx fourth toe right 2.  Ulceration fourth toe right -healed  Plan: -Established office visit for evaluation and management level 3. -Discussed with him that the MRI was positive for osteomyelitis in the distal phalanx of the fourth toe.  He sees Dr. Mcdonell for follow-up in about a week and a half.  Will give him a prescription for doxycycline  for 14 days to hold him over till then.  I explained to them that Dr. Silva can determine what his plan is regarding the bone infection.  He can discontinue any wound care since the ulcer is healed. -Rx doxycycline  100 mg, 1 p.o. twice daily for 14 days  Return for follow-up with the Dr. Silva and scheduled

## 2024-05-29 ENCOUNTER — Other Ambulatory Visit (HOSPITAL_COMMUNITY): Payer: Self-pay

## 2024-05-29 ENCOUNTER — Other Ambulatory Visit: Payer: Self-pay

## 2024-05-29 MED ORDER — METFORMIN HCL ER 500 MG PO TB24
500.0000 mg | ORAL_TABLET | Freq: Every day | ORAL | 0 refills | Status: AC
Start: 2024-05-29 — End: ?
  Filled 2024-05-29: qty 90, 90d supply, fill #0

## 2024-05-29 NOTE — Telephone Encounter (Signed)
 Requested Prescriptions  Pending Prescriptions Disp Refills   metFORMIN  (GLUCOPHAGE -XR) 500 MG 24 hr tablet 90 tablet 1    Sig: Take 1 tablet (500 mg total) by mouth daily with breakfast.     Endocrinology:  Diabetes - Biguanides Failed - 05/29/2024  5:36 PM      Failed - B12 Level in normal range and within 720 days    Vitamin B-12  Date Value Ref Range Status  05/08/2018 571 232 - 1,245 pg/mL Final         Passed - Cr in normal range and within 360 days    Creat  Date Value Ref Range Status  08/17/2015 0.92 0.70 - 1.33 mg/dL Final   Creatinine, Ser  Date Value Ref Range Status  02/08/2024 0.98 0.61 - 1.24 mg/dL Final   Creatinine, Urine  Date Value Ref Range Status  08/16/2017 142.74 mg/dL Final         Passed - HBA1C is between 0 and 7.9 and within 180 days    HbA1c, POC (controlled diabetic range)  Date Value Ref Range Status  05/23/2024 6.8 0.0 - 7.0 % Final         Passed - eGFR in normal range and within 360 days    GFR, Est African American  Date Value Ref Range Status  08/17/2015 >89 >=60 mL/min Final   GFR calc Af Amer  Date Value Ref Range Status  04/14/2020 92 >59 mL/min/1.73 Final    Comment:    **[facility] currently reports eGFR in compliance with the current**   recommendations of the [employer]. [facility] will   update reporting as new guidelines are published from the NKF-ASN   Task force.    GFR, Est Non African American  Date Value Ref Range Status  08/17/2015 >89 >=60 mL/min Final    Comment:      The estimated GFR is a calculation valid for adults (>=22 years old) that uses the CKD-EPI algorithm to adjust for age and sex. It is   not to be used for children, pregnant women, hospitalized patients,    patients on dialysis, or with rapidly changing kidney function. According to the NKDEP, eGFR >89 is normal, 60-89 shows mild impairment, 30-59 shows moderate impairment, 15-29 shows severe impairment and <15 is ESRD.      GFR,  Estimated  Date Value Ref Range Status  02/08/2024 >60 >60 mL/min Final    Comment:    (NOTE) Calculated using the CKD-EPI Creatinine Equation (2021)    eGFR  Date Value Ref Range Status  01/11/2024 73 >59 mL/min/1.73 Final         Passed - Valid encounter within last 6 months    Recent Outpatient Visits           6 days ago Type 2 diabetes mellitus without complication, with long-term current use of insulin  (HCC)   [place] [facility] Celestia Rosaline SQUIBB, NP   4 months ago Type 2 diabetes mellitus without complication, with long-term current use of insulin  [facility])   [place] [facility] Celestia Rosaline SQUIBB, NP   8 months ago Hospital discharge follow-up   [place] [facility] Celestia Rosaline SQUIBB, NP   10 months ago Type 2 diabetes mellitus without complication, with long-term current use of insulin  [facility])    [facility] Celestia Rosaline SQUIBB, NP   1 year ago Type 2 diabetes mellitus without complication, with long-term current use of insulin  (HCC)  [place] [facility] Celestia Rosaline SQUIBB, NP              Passed - CBC within normal limits and completed in the last 12 months    WBC  Date Value Ref Range Status  02/08/2024 8.6 4.0 - 10.5 K/uL Final   RBC  Date Value Ref Range Status  02/08/2024 4.88 4.22 - 5.81 MIL/uL Final   Hemoglobin  Date Value Ref Range Status  02/08/2024 14.0 13.0 - 17.0 g/dL Final  87/79/7975 86.5 13.0 - 17.7 g/dL Final   HCT  Date Value Ref Range Status  02/08/2024 41.8 39.0 - 52.0 % Final   Hematocrit  Date Value Ref Range Status  07/13/2023 40.1 37.5 - 51.0 % Final   MCHC  Date Value Ref Range Status  02/08/2024 33.5 30.0 - 36.0 g/dL Final   [facility]  Date Value Ref Range Status  02/08/2024 28.7 26.0 - 34.0 pg Final   MCV  Date Value Ref Range Status  02/08/2024 85.7 80.0 - 100.0 fL Final  07/13/2023 86 79 - 97 fL Final   No  results found for: PLTCOUNTKUC, LABPLAT, POCPLA RDW  Date Value Ref Range Status  02/08/2024 13.2 11.5 - 15.5 % Final  07/13/2023 13.7 11.6 - 15.4 % Final

## 2024-05-31 ENCOUNTER — Other Ambulatory Visit (INDEPENDENT_AMBULATORY_CARE_PROVIDER_SITE_OTHER): Payer: Self-pay | Admitting: Primary Care

## 2024-05-31 DIAGNOSIS — Z794 Long term (current) use of insulin: Secondary | ICD-10-CM

## 2024-05-31 MED ORDER — ATORVASTATIN CALCIUM 20 MG PO TABS
20.0000 mg | ORAL_TABLET | Freq: Every day | ORAL | 3 refills | Status: AC
  Filled 2024-05-31: qty 90, 90d supply, fill #0

## 2024-06-01 ENCOUNTER — Other Ambulatory Visit: Payer: Self-pay

## 2024-06-02 ENCOUNTER — Other Ambulatory Visit: Payer: Self-pay | Admitting: Pharmacist

## 2024-06-02 NOTE — Progress Notes (Signed)
 Pharmacy Quality Measure Review  This patient is appearing on the insurance-providing list for being at risk of failing the adherence measure for Statin Use in Persons with Diabetes (SUPD) medications this calendar year.   Medication: atorvastatin   Last fill date: was sent 05/31/24. Rxn has not yet been picked-up. Spoke to Mr. Pevehouse this morning and he will pick-up later this week.   Herlene Fleeta Morris, PharmD, JAQUELINE, CPP Clinical Pharmacist [facility] & [facility] [phone]

## 2024-06-04 NOTE — Telephone Encounter (Signed)
 Labs weren't drawn because it was past 1230 and I had went for lunch and you were still in the room with pt

## 2024-06-05 ENCOUNTER — Other Ambulatory Visit: Payer: Self-pay

## 2024-06-05 ENCOUNTER — Ambulatory Visit (INDEPENDENT_AMBULATORY_CARE_PROVIDER_SITE_OTHER): Admitting: Podiatry

## 2024-06-05 DIAGNOSIS — M86171 Other acute osteomyelitis, right ankle and foot: Secondary | ICD-10-CM

## 2024-06-05 MED ORDER — DOXYCYCLINE HYCLATE 100 MG PO TABS
100.0000 mg | ORAL_TABLET | Freq: Two times a day (BID) | ORAL | 0 refills | Status: AC
  Filled 2024-06-05 – 2024-06-09 (×3): qty 28, 14d supply, fill #0

## 2024-06-06 ENCOUNTER — Other Ambulatory Visit: Payer: Self-pay

## 2024-06-08 ENCOUNTER — Encounter: Payer: Self-pay | Admitting: Podiatry

## 2024-06-08 NOTE — Progress Notes (Signed)
  Subjective:  Patient ID: Spencer Robles, male    DOB: 14-Aug-1960,  MRN: [id number]  Chief Complaint  Patient presents with   Diabetic Ulcer    RM 15 Patient is here for diabetic ulcer of the right index toe. Wound is scabbed over with no visible drainage. Patient is requesting a f/u  MRI to for Right 2nd toe.    63 y.o. male presents with the above complaint. History confirmed with patient.  He notes that is doing much better after taking antibiotics  Objective:  Physical Exam: Wound has completely healed on the right fourth toe, there is mild edema     Radiographs: Multiple views x-ray of the right foot: Right foot radiograph shows no bone destruction or osteomyelitis  Previous culture growth of Proteus mirabilis Assessment:   1. Acute osteomyelitis of right ankle or foot (HCC)      Plan:  Patient was evaluated and treated and all questions answered.  Refill of doxycycline  sent for his final course giving him 6 full weeks of treatment.  He has had significant improvement with complete closure of the wound.  Edema is still present but there is no active drainage or cellulitis.  Will take new x-rays at next visit.  Follow-up in 3 weeks or sooner if there are issues.  Return in about 3 weeks (around 06/26/2024) for wound check (take new R foot xrays).

## 2024-06-09 ENCOUNTER — Other Ambulatory Visit: Payer: Self-pay

## 2024-06-09 NOTE — Telephone Encounter (Signed)
 Schedule pt a lab visit

## 2024-06-10 ENCOUNTER — Other Ambulatory Visit (INDEPENDENT_AMBULATORY_CARE_PROVIDER_SITE_OTHER)

## 2024-06-10 ENCOUNTER — Other Ambulatory Visit: Payer: Self-pay | Admitting: Pharmacist

## 2024-06-10 NOTE — Progress Notes (Signed)
 Pharmacy Quality Measure Review  This patient is appearing on the insurance-providing list for being at risk of failing the adherence measure for Statin Use in Persons with Diabetes (SUPD) medications this calendar year.   Medication: atorvastatin   Last fill date: 06/06/24 for a 90-day supply. SUPD gap closed.   Herlene Fleeta Morris, PharmD, JAQUELINE, CPP Clinical Pharmacist [facility] & [facility] [phone]

## 2024-06-11 LAB — CBC WITH DIFFERENTIAL/PLATELET
Basophils Absolute: 0.1 x10E3/uL (ref 0.0–0.2)
Basos: 1 %
EOS (ABSOLUTE): 0.4 x10E3/uL (ref 0.0–0.4)
Eos: 6 %
Hematocrit: 44.7 % (ref 37.5–51.0)
Hemoglobin: 14.4 g/dL (ref 13.0–17.7)
Immature Grans (Abs): 0 x10E3/uL (ref 0.0–0.1)
Immature Granulocytes: 0 %
Lymphocytes Absolute: 1.8 x10E3/uL (ref 0.7–3.1)
Lymphs: 27 %
MCH: 28.5 pg (ref 26.6–33.0)
MCHC: 32.2 g/dL (ref 31.5–35.7)
MCV: 89 fL (ref 79–97)
Monocytes Absolute: 0.4 x10E3/uL (ref 0.1–0.9)
Monocytes: 6 %
Neutrophils Absolute: 4 x10E3/uL (ref 1.4–7.0)
Neutrophils: 60 %
Platelets: 133 x10E3/uL — ABNORMAL LOW (ref 150–450)
RBC: 5.05 x10E6/uL (ref 4.14–5.80)
RDW: 13.8 % (ref 11.6–15.4)
WBC: 6.7 x10E3/uL (ref 3.4–10.8)

## 2024-06-11 LAB — LIPID PANEL
Chol/HDL Ratio: 3.2 ratio (ref 0.0–5.0)
Cholesterol, Total: 136 mg/dL (ref 100–199)
HDL: 43 mg/dL (ref >39)
LDL Chol Calc (NIH): 73 mg/dL (ref 0–99)
Triglycerides: 108 mg/dL (ref 0–149)
VLDL Cholesterol Cal: 20 mg/dL (ref 5–40)

## 2024-06-11 LAB — CMP14+EGFR
ALT: 37 IU/L (ref 0–44)
AST: 30 IU/L (ref 0–40)
Albumin: 4.5 g/dL (ref 3.9–4.9)
Alkaline Phosphatase: 106 IU/L (ref 47–123)
BUN/Creatinine Ratio: 14 (ref 10–24)
BUN: 15 mg/dL (ref 8–27)
Bilirubin Total: 0.4 mg/dL (ref 0.0–1.2)
CO2: 19 mmol/L — ABNORMAL LOW (ref 20–29)
Calcium: 9.4 mg/dL (ref 8.6–10.2)
Chloride: 101 mmol/L (ref 96–106)
Creatinine, Ser: 1.08 mg/dL (ref 0.76–1.27)
Globulin, Total: 2.4 g/dL (ref 1.5–4.5)
Glucose: 107 mg/dL — ABNORMAL HIGH (ref 70–99)
Potassium: 4.7 mmol/L (ref 3.5–5.2)
Sodium: 138 mmol/L (ref 134–144)
Total Protein: 6.9 g/dL (ref 6.0–8.5)
eGFR: 76 mL/min/1.73 (ref >59)

## 2024-06-12 ENCOUNTER — Ambulatory Visit (INDEPENDENT_AMBULATORY_CARE_PROVIDER_SITE_OTHER): Payer: Self-pay | Admitting: Primary Care

## 2024-06-26 ENCOUNTER — Encounter: Payer: Self-pay | Admitting: Podiatry

## 2024-06-26 ENCOUNTER — Ambulatory Visit (INDEPENDENT_AMBULATORY_CARE_PROVIDER_SITE_OTHER): Admitting: Podiatry

## 2024-06-26 DIAGNOSIS — M86171 Other acute osteomyelitis, right ankle and foot: Secondary | ICD-10-CM | POA: Diagnosis not present

## 2024-06-26 NOTE — Progress Notes (Signed)
  Subjective:  Patient ID: Spencer Robles, male    DOB: [birth year],  MRN: [id number]  Chief Complaint  Patient presents with   Foot Ulcer    Rm20 F/u foot ulceration right 3rd toe/ pt reports toe feels stronger then before and he is not having any pain.    63 y.o. male presents with the above complaint. History confirmed with patient.  He notes that is doing much better has a few days left of antibiotics  Objective:  Physical Exam: Wound has completely healed on the right fourth toe, there is mild edema     Radiographs: Multiple views x-ray of the right foot: Right foot radiograph shows no bone destruction or osteomyelitis  Previous culture growth of Proteus mirabilis Assessment:   1. Acute osteomyelitis of right ankle or foot (HCC)      Plan:  Patient was evaluated and treated and all questions answered.  Almost finished with doxycycline  course she will finish the current course and follow-up with me in 3 to 4 weeks.  Will take new x-rays at next visit.  Healing well and satisfied with progress No follow-ups on file.

## 2024-07-10 ENCOUNTER — Telehealth (INDEPENDENT_AMBULATORY_CARE_PROVIDER_SITE_OTHER): Payer: Self-pay | Admitting: Primary Care

## 2024-07-10 NOTE — Telephone Encounter (Signed)
 Copied from CRM [id number]. Topic: Clinical - Medical Advice >> Jul 10, 2024 11:46 AM Anairis L wrote: Reason for CRM: Patient is asking for Rosaline Bohr to call [facility] at [phone] to give a verbal confirmation patient has diabetes. (Chronic verification department)  Patient is asking for a email to confirm when call is completed.   Thank you.

## 2024-07-11 ENCOUNTER — Telehealth: Payer: Self-pay

## 2024-07-11 NOTE — Telephone Encounter (Signed)
 Reached out to [facility] and spoke with Odis and we did the C-SNP Verification and he will fax confirmation

## 2024-07-11 NOTE — Telephone Encounter (Signed)
 Copied from CRM [id number]. Topic: Clinical - Medical Advice >> Jul 10, 2024 11:46 AM Anairis L wrote: Reason for CRM: Patient is asking for Rosaline Bohr to call [facility] at [phone] to give a verbal confirmation patient has diabetes. (Chronic verification department)  Patient is asking for a email to confirm when call is completed.   Thank you. >> Jul 11, 2024 10:03 AM Delon T wrote: Patient calling for follow up, would like email confirmation or message in [place] when it is complete. >> Jul 10, 2024  4:53 PM Viola F wrote: Patient called to follow up on this  >> Jul 10, 2024  3:17 PM Shanda MATSU wrote: Patient called back to check status of req for provider to call [facility] and confirm that he is diabetic, adv patient that I show req was sent over to provider, however, provider has not completed req as of yet.

## 2024-07-16 ENCOUNTER — Other Ambulatory Visit: Payer: Self-pay

## 2024-07-16 ENCOUNTER — Other Ambulatory Visit (INDEPENDENT_AMBULATORY_CARE_PROVIDER_SITE_OTHER): Payer: Self-pay | Admitting: Primary Care

## 2024-07-16 DIAGNOSIS — Z760 Encounter for issue of repeat prescription: Secondary | ICD-10-CM

## 2024-07-16 DIAGNOSIS — E119 Type 2 diabetes mellitus without complications: Secondary | ICD-10-CM

## 2024-07-18 ENCOUNTER — Other Ambulatory Visit: Payer: Self-pay

## 2024-07-18 MED ORDER — TRULICITY 1.5 MG/0.5ML ~~LOC~~ SOAJ
1.5000 mg | SUBCUTANEOUS | 0 refills | Status: AC
  Filled 2024-07-18 – 2024-07-29 (×3): qty 6, 84d supply, fill #0

## 2024-07-18 NOTE — Telephone Encounter (Signed)
 Requested Prescriptions  Pending Prescriptions Disp Refills   Dulaglutide  (TRULICITY ) 1.5 MG/0.5ML SOAJ 6 mL 0    Sig: Inject 1.5 mg into the skin once a week.     Endocrinology:  Diabetes - GLP-1 Receptor Agonists Passed - 07/18/2024  1:20 PM      Passed - HBA1C is between 0 and 7.9 and within 180 days    HbA1c, POC (controlled diabetic range)  Date Value Ref Range Status  05/23/2024 6.8 0.0 - 7.0 % Final         Passed - Valid encounter within last 6 months    Recent Outpatient Visits           1 month ago Type 2 diabetes mellitus without complication, with long-term current use of insulin  (HCC)   [place] [facility] Celestia Rosaline SQUIBB, NP   6 months ago Type 2 diabetes mellitus without complication, with long-term current use of insulin  [facility])   [place] [facility] Celestia Rosaline SQUIBB, NP   9 months ago Hospital discharge follow-up   [place] [facility] Celestia Rosaline SQUIBB, NP   1 year ago Type 2 diabetes mellitus without complication, with long-term current use of insulin  (HCC)   [place] [facility] Celestia Rosaline SQUIBB, NP   1 year ago Type 2 diabetes mellitus without complication, with long-term current use of insulin  [facility])   [place] [facility] Celestia Rosaline SQUIBB, NP

## 2024-07-29 ENCOUNTER — Other Ambulatory Visit: Payer: Self-pay

## 2024-07-29 NOTE — Progress Notes (Signed)
 Blessed Cotham                                          MRN: [id number]   07/29/2024   The [facility] Quality Team Specialist reviewed this patient medical record for the purposes of chart review for care gap closure. The following were reviewed: abstraction for care gap closure-glycemic status assessment.    [facility] Quality Team

## 2024-08-05 ENCOUNTER — Ambulatory Visit: Admitting: Podiatry

## 2024-08-05 ENCOUNTER — Ambulatory Visit (INDEPENDENT_AMBULATORY_CARE_PROVIDER_SITE_OTHER)

## 2024-08-05 VITALS — Ht 71.0 in | Wt 292.0 lb

## 2024-08-05 DIAGNOSIS — E08621 Diabetes mellitus due to underlying condition with foot ulcer: Secondary | ICD-10-CM

## 2024-08-05 DIAGNOSIS — L97512 Non-pressure chronic ulcer of other part of right foot with fat layer exposed: Secondary | ICD-10-CM

## 2024-08-07 NOTE — Progress Notes (Signed)
"  °  Subjective:  Patient ID: Spencer Robles, male    DOB: 1961/04/15,  MRN: [id number]  Chief Complaint  Patient presents with   Wound Check    Rm 4 Patient is here for ulcer of the 3rd right toe. Wound is scabbed over with no visible drainage.    64 y.o. male presents with the above complaint. History confirmed with patient.  He is doing well and has not had any bleeding drainage or blistering.  Calluses have returned.  He has finished his antibiotics.  Objective:  Physical Exam: Wound has completely healed on the right fourth toe, there is mild edema.  Multiple digital calluses.     Radiographs: Multiple views x-ray of the right foot: New radiographs taken today shows no change in bony contours or development of any further infection  Previous culture growth of Proteus mirabilis Assessment:   1. 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  We reviewed his x-rays and clinical progress.  I debrided the calluses that were symptomatic today and she will return in 3 months for our is diabetic footcare.  Infection seems to be controlled with antibiotic therapy.  He will follow-up with me as needed I advised him on signs and symptoms of recurrent or worsening ulceration.   Return in about 12 weeks (around 10/28/2024) for [facility].   "

## 2024-08-22 ENCOUNTER — Telehealth: Payer: Self-pay | Admitting: Primary Care

## 2024-08-22 NOTE — Telephone Encounter (Signed)
 Called pt but his phone is unavailable. Please advise.

## 2024-08-25 ENCOUNTER — Other Ambulatory Visit: Payer: Self-pay | Admitting: Pharmacist

## 2024-08-25 ENCOUNTER — Ambulatory Visit (INDEPENDENT_AMBULATORY_CARE_PROVIDER_SITE_OTHER): Admitting: Primary Care

## 2024-08-25 NOTE — Progress Notes (Signed)
 Pharmacy Quality Measure Review  This patient is appearing on the insurance-providing list for being at risk of failing the adherence measure for Statin Use in Persons with Diabetes (SUPD) medications this calendar year.   Medication: atorvastatin   Last fill date: 06/06/24 for a 90-day supply. SUPD gap closed for 2025.   Reminder set for fill next week to make sure gap closes for 2026.   Herlene Fleeta Morris, PharmD, JAQUELINE, CPP Clinical Pharmacist [facility] [phone]

## 2024-08-27 ENCOUNTER — Other Ambulatory Visit: Payer: Self-pay

## 2024-08-27 ENCOUNTER — Other Ambulatory Visit (HOSPITAL_COMMUNITY): Payer: Self-pay

## 2024-08-27 ENCOUNTER — Telehealth: Payer: Self-pay | Admitting: Podiatry

## 2024-08-27 ENCOUNTER — Telehealth (INDEPENDENT_AMBULATORY_CARE_PROVIDER_SITE_OTHER): Payer: Self-pay | Admitting: Primary Care

## 2024-08-27 MED ORDER — DOXYCYCLINE HYCLATE 100 MG PO TABS
100.0000 mg | ORAL_TABLET | Freq: Two times a day (BID) | ORAL | 0 refills | Status: AC
  Filled 2024-08-27: qty 28, 14d supply, fill #0

## 2024-08-27 NOTE — Telephone Encounter (Signed)
 Called pt to confirm appt. Pt stated that we were going to have bad weather. Pt said he would call back to confirm appt tom.

## 2024-08-27 NOTE — Telephone Encounter (Signed)
 Patient advised

## 2024-08-27 NOTE — Telephone Encounter (Signed)
 The patient, Spencer Robles, called in today to see if he could get a refill of the Doxycycline  hyclate 100mg  Advised: will communicate his request with Dr. Silva & his nurse to see if his request can be accommodated without having to come in to be seen

## 2024-08-28 ENCOUNTER — Ambulatory Visit (INDEPENDENT_AMBULATORY_CARE_PROVIDER_SITE_OTHER): Admitting: Primary Care

## 2024-09-05 ENCOUNTER — Ambulatory Visit: Admitting: Podiatry

## 2024-11-03 ENCOUNTER — Ambulatory Visit: Admitting: Podiatry
